# Patient Record
Sex: Female | Born: 1950
Health system: Southern US, Community
[De-identification: ages and names within clinical notes are randomized; demographics above are authoritative.]

## PROBLEM LIST (undated history)

## (undated) DIAGNOSIS — F410 Panic disorder [episodic paroxysmal anxiety] without agoraphobia: Secondary | ICD-10-CM

## (undated) DIAGNOSIS — R918 Other nonspecific abnormal finding of lung field: Secondary | ICD-10-CM

## (undated) DIAGNOSIS — Z789 Other specified health status: Secondary | ICD-10-CM

## (undated) DIAGNOSIS — Z8669 Personal history of other diseases of the nervous system and sense organs: Secondary | ICD-10-CM

## (undated) DIAGNOSIS — Z8709 Personal history of other diseases of the respiratory system: Secondary | ICD-10-CM

## (undated) DIAGNOSIS — J449 Chronic obstructive pulmonary disease, unspecified: Secondary | ICD-10-CM

## (undated) DIAGNOSIS — Z5111 Encounter for antineoplastic chemotherapy: Secondary | ICD-10-CM

## (undated) DIAGNOSIS — S22000A Wedge compression fracture of unspecified thoracic vertebra, initial encounter for closed fracture: Secondary | ICD-10-CM

## (undated) DIAGNOSIS — J189 Pneumonia, unspecified organism: Secondary | ICD-10-CM

## (undated) DIAGNOSIS — C3492 Malignant neoplasm of unspecified part of left bronchus or lung: Secondary | ICD-10-CM

## (undated) DIAGNOSIS — K219 Gastro-esophageal reflux disease without esophagitis: Secondary | ICD-10-CM

## (undated) DIAGNOSIS — Z923 Personal history of irradiation: Secondary | ICD-10-CM

## (undated) HISTORY — DX: Wedge compression fracture of unspecified thoracic vertebra, initial encounter for closed fracture: S22.000A

## (undated) HISTORY — PX: ABDOMINAL HYSTERECTOMY: SHX81

## (undated) HISTORY — PX: OOPHORECTOMY: SHX86

## (undated) HISTORY — PX: APPENDECTOMY: SHX54

## (undated) HISTORY — DX: Other specified health status: Z78.9

## (undated) HISTORY — DX: Malignant neoplasm of unspecified part of left bronchus or lung: C34.92

## (undated) HISTORY — PX: TONSILLECTOMY: SUR1361

## (undated) HISTORY — DX: Encounter for antineoplastic chemotherapy: Z51.11

---

## 2014-07-09 ENCOUNTER — Emergency Department (HOSPITAL_BASED_OUTPATIENT_CLINIC_OR_DEPARTMENT_OTHER): Payer: Self-pay

## 2014-07-09 ENCOUNTER — Inpatient Hospital Stay (HOSPITAL_BASED_OUTPATIENT_CLINIC_OR_DEPARTMENT_OTHER)
Admission: EM | Admit: 2014-07-09 | Discharge: 2014-07-12 | DRG: 190 | Disposition: A | Discharge status: Home / Self Care | Payer: Self-pay | Attending: Internal Medicine | Admitting: Internal Medicine

## 2014-07-09 ENCOUNTER — Encounter (HOSPITAL_BASED_OUTPATIENT_CLINIC_OR_DEPARTMENT_OTHER): Payer: Self-pay | Admitting: Emergency Medicine

## 2014-07-09 DIAGNOSIS — C3492 Malignant neoplasm of unspecified part of left bronchus or lung: Secondary | ICD-10-CM

## 2014-07-09 DIAGNOSIS — F17200 Nicotine dependence, unspecified, uncomplicated: Secondary | ICD-10-CM

## 2014-07-09 DIAGNOSIS — R918 Other nonspecific abnormal finding of lung field: Secondary | ICD-10-CM | POA: Diagnosis present

## 2014-07-09 DIAGNOSIS — R0602 Shortness of breath: Secondary | ICD-10-CM

## 2014-07-09 DIAGNOSIS — J441 Chronic obstructive pulmonary disease with (acute) exacerbation: Principal | ICD-10-CM | POA: Diagnosis present

## 2014-07-09 DIAGNOSIS — J9621 Acute and chronic respiratory failure with hypoxia: Secondary | ICD-10-CM | POA: Diagnosis present

## 2014-07-09 DIAGNOSIS — F17210 Nicotine dependence, cigarettes, uncomplicated: Secondary | ICD-10-CM | POA: Diagnosis present

## 2014-07-09 HISTORY — DX: Chronic obstructive pulmonary disease, unspecified: J44.9

## 2014-07-09 HISTORY — DX: Malignant neoplasm of unspecified part of left bronchus or lung: C34.92

## 2014-07-09 LAB — CBC WITH DIFFERENTIAL/PLATELET
Basophils Absolute: 0.1 10*3/uL (ref 0.0–0.1)
Basophils Relative: 1 % (ref 0–1)
Eosinophils Absolute: 0.7 10*3/uL (ref 0.0–0.7)
Eosinophils Relative: 7 % — ABNORMAL HIGH (ref 0–5)
HCT: 41.1 % (ref 36.0–46.0)
Hemoglobin: 13.6 g/dL (ref 12.0–15.0)
LYMPHS ABS: 2.2 10*3/uL (ref 0.7–4.0)
LYMPHS PCT: 22 % (ref 12–46)
MCH: 31.1 pg (ref 26.0–34.0)
MCHC: 33.1 g/dL (ref 30.0–36.0)
MCV: 94.1 fL (ref 78.0–100.0)
MONOS PCT: 9 % (ref 3–12)
Monocytes Absolute: 0.9 10*3/uL (ref 0.1–1.0)
NEUTROS PCT: 61 % (ref 43–77)
Neutro Abs: 6 10*3/uL (ref 1.7–7.7)
Platelets: 319 10*3/uL (ref 150–400)
RBC: 4.37 MIL/uL (ref 3.87–5.11)
RDW: 14.9 % (ref 11.5–15.5)
WBC: 9.8 10*3/uL (ref 4.0–10.5)

## 2014-07-09 LAB — BASIC METABOLIC PANEL
Anion gap: 14 (ref 5–15)
BUN: 7 mg/dL (ref 6–23)
CHLORIDE: 102 meq/L (ref 96–112)
CO2: 26 meq/L (ref 19–32)
Calcium: 8.9 mg/dL (ref 8.4–10.5)
Creatinine, Ser: 0.5 mg/dL (ref 0.50–1.10)
GFR calc Af Amer: >90 mL/min (ref >90)
GFR calc non Af Amer: >90 mL/min (ref >90)
Glucose, Bld: 98 mg/dL (ref 70–99)
POTASSIUM: 3.5 meq/L — AB (ref 3.7–5.3)
SODIUM: 142 meq/L (ref 137–147)

## 2014-07-09 LAB — TROPONIN I: Troponin I: <0.3 ng/mL (ref <0.30)

## 2014-07-09 MED ORDER — LEVOFLOXACIN IN D5W 500 MG/100ML IV SOLN
500.0000 mg | Freq: Every day | INTRAVENOUS | Status: DC
  Administered 2014-07-09 – 2014-07-10 (×2): 500 mg via INTRAVENOUS
  Filled 2014-07-09 (×2): qty 100

## 2014-07-09 MED ORDER — DEXTROSE 5 % IV SOLN
1.0000 g | Freq: Once | INTRAVENOUS | Status: AC
  Administered 2014-07-09: 1 g via INTRAVENOUS

## 2014-07-09 MED ORDER — ALBUTEROL SULFATE (2.5 MG/3ML) 0.083% IN NEBU: 2.5000 mg | INHALATION_SOLUTION | Freq: Four times a day (QID) | RESPIRATORY_TRACT | Status: DC | PRN

## 2014-07-09 MED ORDER — METHYLPREDNISOLONE SODIUM SUCC 125 MG IJ SOLR
80.0000 mg | Freq: Three times a day (TID) | INTRAMUSCULAR | Status: DC
  Administered 2014-07-09 – 2014-07-11 (×5): 80 mg via INTRAVENOUS
  Filled 2014-07-09 (×8): qty 1.28

## 2014-07-09 MED ORDER — ALBUTEROL SULFATE (2.5 MG/3ML) 0.083% IN NEBU
2.5000 mg | INHALATION_SOLUTION | Freq: Four times a day (QID) | RESPIRATORY_TRACT | Status: DC
  Administered 2014-07-09: 2.5 mg via RESPIRATORY_TRACT
  Filled 2014-07-09: qty 3

## 2014-07-09 MED ORDER — ENOXAPARIN SODIUM 40 MG/0.4ML ~~LOC~~ SOLN
40.0000 mg | SUBCUTANEOUS | Status: DC
  Administered 2014-07-09 – 2014-07-10 (×2): 40 mg via SUBCUTANEOUS
  Filled 2014-07-09 (×4): qty 0.4

## 2014-07-09 MED ORDER — HYDROCODONE-ACETAMINOPHEN 5-325 MG PO TABS
1.0000 | ORAL_TABLET | Freq: Four times a day (QID) | ORAL | Status: DC | PRN
  Administered 2014-07-10 – 2014-07-11 (×3): 1 via ORAL
  Filled 2014-07-09 (×3): qty 1

## 2014-07-09 MED ORDER — HYDROCOD POLST-CHLORPHEN POLST 10-8 MG/5ML PO LQCR
5.0000 mL | Freq: Two times a day (BID) | ORAL | Status: DC | PRN
  Administered 2014-07-09 – 2014-07-11 (×2): 5 mL via ORAL
  Filled 2014-07-09 (×3): qty 5

## 2014-07-09 MED ORDER — ALBUTEROL (5 MG/ML) CONTINUOUS INHALATION SOLN
10.0000 mg/h | INHALATION_SOLUTION | RESPIRATORY_TRACT | Status: DC
  Filled 2014-07-09 (×2): qty 20

## 2014-07-09 MED ORDER — CEFTRIAXONE SODIUM 1 G IJ SOLR
INTRAMUSCULAR | Status: AC
  Filled 2014-07-09: qty 10

## 2014-07-09 MED ORDER — IOHEXOL 300 MG/ML  SOLN
80.0000 mL | Freq: Once | INTRAMUSCULAR | Status: AC | PRN
  Administered 2014-07-09: 80 mL via INTRAVENOUS

## 2014-07-09 MED ORDER — TIOTROPIUM BROMIDE MONOHYDRATE 18 MCG IN CAPS
18.0000 ug | ORAL_CAPSULE | Freq: Every day | RESPIRATORY_TRACT | Status: DC
  Administered 2014-07-10: 18 ug via RESPIRATORY_TRACT
  Filled 2014-07-09: qty 5

## 2014-07-09 MED ORDER — SODIUM CHLORIDE 0.9 % IJ SOLN
3.0000 mL | Freq: Two times a day (BID) | INTRAMUSCULAR | Status: DC
  Administered 2014-07-09 – 2014-07-12 (×5): 3 mL via INTRAVENOUS

## 2014-07-09 MED ORDER — ALBUTEROL SULFATE (2.5 MG/3ML) 0.083% IN NEBU
10.0000 mg | INHALATION_SOLUTION | Freq: Once | RESPIRATORY_TRACT | Status: AC
  Administered 2014-07-09: 10 mg via RESPIRATORY_TRACT

## 2014-07-09 MED ORDER — SODIUM CHLORIDE 0.9 % IV SOLN
INTRAVENOUS | Status: AC
  Administered 2014-07-09 – 2014-07-10 (×2): via INTRAVENOUS

## 2014-07-09 MED ORDER — DEXTROSE 5 % IV SOLN
500.0000 mg | Freq: Once | INTRAVENOUS | Status: AC
  Administered 2014-07-09: 500 mg via INTRAVENOUS
  Filled 2014-07-09: qty 500

## 2014-07-09 MED ORDER — IPRATROPIUM BROMIDE 0.02 % IN SOLN
0.5000 mg | Freq: Once | RESPIRATORY_TRACT | Status: AC
  Administered 2014-07-09: 0.5 mg via RESPIRATORY_TRACT
  Filled 2014-07-09: qty 2.5

## 2014-07-09 MED ORDER — ALBUTEROL SULFATE (2.5 MG/3ML) 0.083% IN NEBU
5.0000 mg | INHALATION_SOLUTION | Freq: Once | RESPIRATORY_TRACT | Status: AC
  Administered 2014-07-09: 5 mg via RESPIRATORY_TRACT
  Filled 2014-07-09: qty 6

## 2014-07-09 MED ORDER — METHYLPREDNISOLONE SODIUM SUCC 125 MG IJ SOLR
125.0000 mg | Freq: Once | INTRAMUSCULAR | Status: AC
  Administered 2014-07-09: 125 mg via INTRAVENOUS
  Filled 2014-07-09: qty 2

## 2014-07-09 MED ORDER — LORAZEPAM 1 MG PO TABS
0.5000 mg | ORAL_TABLET | Freq: Once | ORAL | Status: AC
  Administered 2014-07-09: 0.5 mg via ORAL
  Filled 2014-07-09: qty 1

## 2014-07-09 MED ORDER — ALBUTEROL SULFATE (2.5 MG/3ML) 0.083% IN NEBU
2.5000 mg | INHALATION_SOLUTION | Freq: Three times a day (TID) | RESPIRATORY_TRACT | Status: DC
  Administered 2014-07-10: 2.5 mg via RESPIRATORY_TRACT
  Filled 2014-07-09: qty 3

## 2014-07-09 MED ORDER — SODIUM CHLORIDE 0.9 % IV SOLN
Freq: Once | INTRAVENOUS | Status: AC
  Administered 2014-07-09: 16:00:00 via INTRAVENOUS

## 2014-07-09 NOTE — ED Notes (Signed)
Pt presents to ED with complaints of cough and shortness of breath.

## 2014-07-09 NOTE — H&P (Addendum)
Heather Banks is an 63 y.o. female.    Pcp: unassigned  Chief Complaint: dyspnea HPI: 63 yo female smoker with hx of copd, tobacco use apparently c/o cough w yellow sputum, sob, wheezing.  Pt presented to ED for evaluation and found to have spiculated mass on CT scan.  Pt is admitted for w/up of Copd exacerbation and lung mass.   Past Medical History  Diagnosis Date  . COPD (chronic obstructive pulmonary disease)   . Tobacco use     Past Surgical History  Procedure Laterality Date  . Abdominal hysterectomy      Family History  Problem Relation Age of Onset  . COPD Father    Social History:  reports that she has been smoking Cigarettes.  She has a 40 pack-year smoking history. She does not have any smokeless tobacco history on file. She reports that she does not drink alcohol. Her drug history is not on file.  Allergies: No Known Allergies  Medications Prior to Admission  Medication Sig Dispense Refill  . Multiple Vitamins-Minerals (MULTIVITAMIN WITH MINERALS) tablet Take 1 tablet by mouth daily.      Results for orders placed or performed during the hospital encounter of 07/09/14 (from the past 48 hour(s))  Troponin I     Status: None   Collection Time: 07/09/14  3:25 PM  Result Value Ref Range   Troponin I <0.30 <0.30 ng/mL    Comment:        Due to the release kinetics of cTnI, a negative result within the first hours of the onset of symptoms does not rule out myocardial infarction with certainty. If myocardial infarction is still suspected, repeat the test at appropriate intervals.   CBC with Differential     Status: Abnormal   Collection Time: 07/09/14  3:25 PM  Result Value Ref Range   WBC 9.8 4.0 - 10.5 K/uL   RBC 4.37 3.87 - 5.11 MIL/uL   Hemoglobin 13.6 12.0 - 15.0 g/dL   HCT 41.1 36.0 - 46.0 %   MCV 94.1 78.0 - 100.0 fL   MCH 31.1 26.0 - 34.0 pg   MCHC 33.1 30.0 - 36.0 g/dL   RDW 14.9 11.5 - 15.5 %   Platelets 319 150 - 400 K/uL   Neutrophils Relative  % 61 43 - 77 %   Neutro Abs 6.0 1.7 - 7.7 K/uL   Lymphocytes Relative 22 12 - 46 %   Lymphs Abs 2.2 0.7 - 4.0 K/uL   Monocytes Relative 9 3 - 12 %   Monocytes Absolute 0.9 0.1 - 1.0 K/uL   Eosinophils Relative 7 (H) 0 - 5 %   Eosinophils Absolute 0.7 0.0 - 0.7 K/uL   Basophils Relative 1 0 - 1 %   Basophils Absolute 0.1 0.0 - 0.1 K/uL  Basic metabolic panel     Status: Abnormal   Collection Time: 07/09/14  3:25 PM  Result Value Ref Range   Sodium 142 137 - 147 mEq/L   Potassium 3.5 (L) 3.7 - 5.3 mEq/L   Chloride 102 96 - 112 mEq/L   CO2 26 19 - 32 mEq/L   Glucose, Bld 98 70 - 99 mg/dL   BUN 7 6 - 23 mg/dL   Creatinine, Ser 0.50 0.50 - 1.10 mg/dL   Calcium 8.9 8.4 - 10.5 mg/dL   GFR calc non Af Amer >90 >90 mL/min   GFR calc Af Amer >90 >90 mL/min    Comment: (NOTE) The eGFR has been calculated using  the CKD EPI equation. This calculation has not been validated in all clinical situations. eGFR's persistently <90 mL/min signify possible Chronic Kidney Disease.    Anion gap 14 5 - 15   Ct Chest W Contrast  07/09/2014   CLINICAL DATA:  Left upper lobe lung mass. History of smoking and cough. Congestion.  EXAM: CT CHEST WITH CONTRAST  TECHNIQUE: Multidetector CT imaging of the chest was performed during intravenous contrast administration.  CONTRAST:  86m OMNIPAQUE IOHEXOL 300 MG/ML  SOLN  COMPARISON:  Chest x-ray 07/09/2014  FINDINGS: Heart: Heart size is normal. Coronary artery calcifications are noted. No pericardial effusion.  Vascular structures: There is atherosclerotic calcification of the thoracic aorta. No aneurysm or dissection. Pulmonary arteries are grossly well opacified.  Mediastinum/thyroid: Small mediastinal and hilar lymph nodes are identified. Left hilar lymph node measures 8 mm. Pre vascular lymph node measures 10 mm. Precarinal lymph node measures 12 mm. Right hilar lymph node measures 13 mm. The visualized portion of the thyroid gland has a normal appearance.   Lungs/Airways: Within the left upper lobe there is an irregular spiculated mass which measures 3.3 x 4.3 x 4.2 cm. This is adjacent to the thickened pleura and is suspicious for primary bronchogenic neoplasm. Within the right upper lobe a similar appearing spiculated mass is identified. This measures 1.9 x 2.6 x 2.9 cm. Within the right middle lobe there is irregular density which appears less masslike and measures 2.9 x 3.5 cm. Lungs are hyperinflated. Emphysematous changes are present.  Upper abdomen: The visualized portions of the adrenal glands are unremarkable. Right renal cysts are noted. Gallbladder is present. There is atherosclerotic calcification of the abdominal aorta.  Chest wall/osseous structures: There are degenerative changes in the thoracic spine. No suspicious lytic or blastic lesions are identified.  IMPRESSION: 1. Spiculated mass in the left upper lobe contiguous with thickened pleura measuring 4.3 cm and suspicious for primary bronchogenic neoplasm. 2. A second similar 2.9 cm nodule is identified in the right upper lobe suspicious for a second primary. 3. Small bilateral hilar and mediastinal lymph nodes as described. 4. Emphysema. 5. Normal adrenal glands. 6. Small right renal cysts. 7. Coronary artery calcifications.   Electronically Signed   By: BShon HaleM.D.   On: 07/09/2014 17:03   Dg Chest Port 1 View  07/09/2014   CLINICAL DATA:  Cough and congestion for several days.  EXAM: PORTABLE CHEST - 1 VIEW  COMPARISON:  None.  FINDINGS: Focal masslike opacity in the left upper lobe near the apex with adjacent pleural thickening. This opacity measures 3.7 cm in diameter.  Coarse reticular and patchy airspace opacity is noted in the right upper lobe.  Lungs are hyperexpanded but otherwise clear. No pleural effusion or pneumothorax.  Cardiac silhouette is normal in size. No mediastinal or hilar masses.  Bony thorax is demineralized but intact.  IMPRESSION: 1. Focal masslike opacity in the  left upper lobe near the apex. This may reflect focal pneumonia. Neoplastic disease is also in the differential diagnosis. Patient also has right upper lobe opacity that may reflect scarring or acute infiltrate or a combination. Recommend followup chest CT with contrast for further assessment. 2. Lungs are mildly hyperexpanded.  This suggests underlying COPD.   Electronically Signed   By: DLajean ManesM.D.   On: 07/09/2014 15:25    Review of Systems  Constitutional: Positive for weight loss. Negative for fever, chills, malaise/fatigue and diaphoresis.  HENT: Negative for congestion, ear discharge, ear pain, hearing loss, nosebleeds,  sore throat and tinnitus.   Eyes: Negative for blurred vision, double vision, photophobia, pain, discharge and redness.  Respiratory: Positive for cough, shortness of breath and wheezing. Negative for hemoptysis, sputum production and stridor.   Cardiovascular: Negative for chest pain, palpitations, orthopnea, claudication, leg swelling and PND.  Gastrointestinal: Negative for heartburn, nausea, vomiting, abdominal pain, diarrhea, constipation, blood in stool and melena.  Genitourinary: Negative for dysuria, urgency, frequency, hematuria and flank pain.  Musculoskeletal: Negative for myalgias, back pain, joint pain, falls and neck pain.  Skin: Negative for itching and rash.  Neurological: Negative for dizziness, tingling, tremors, sensory change, speech change, focal weakness, seizures, loss of consciousness, weakness and headaches.  Endo/Heme/Allergies: Negative for environmental allergies and polydipsia. Does not bruise/bleed easily.  Psychiatric/Behavioral: Negative for depression, suicidal ideas, hallucinations, memory loss and substance abuse. The patient is not nervous/anxious and does not have insomnia.     Blood pressure 146/67, pulse 106, temperature 98.4 F (36.9 C), temperature source Oral, resp. rate 20, height _0  (1.651 m), weight 70.308 kg (155 lb),  SpO2 90 %. Physical Exam  Constitutional: She is oriented to person, place, and time. She appears well-developed and well-nourished.  HENT:  Head: Normocephalic and atraumatic.  Eyes: Conjunctivae and EOM are normal. Pupils are equal, round, and reactive to light.  Neck: Normal range of motion. Neck supple. No JVD present. No tracheal deviation present. No thyromegaly present.  Cardiovascular: Normal rate and regular rhythm.  Exam reveals no gallop and no friction rub.   No murmur heard. Respiratory: Effort normal. No stridor. She has wheezes. She has no rales.  GI: Soft. Bowel sounds are normal. She exhibits no distension. There is no tenderness. There is no rebound and no guarding.  Musculoskeletal: Normal range of motion. She exhibits no edema or tenderness.  Lymphadenopathy:    She has no cervical adenopathy.  Neurological: She is alert and oriented to person, place, and time. She has normal reflexes. She displays normal reflexes. No cranial nerve deficit. She exhibits normal muscle tone. Coordination normal.  Skin: Skin is warm and dry. No rash noted. No erythema. No pallor.  Psychiatric: She has a normal mood and affect. Her behavior is normal. Judgment and thought content normal.     Assessment/Plan Dyspnea secondary to copd exacerbation and lung mass Solumedrol 51m iv q8h spiriva 1puff qday Albuterol 0.083% 1 neb q6h and q6h prn Levaquin 504miv qday Will need pft once more stable  Lung mass  Pulmonary consulted, we appreciate their input Check MRI brain Check PET scan  Headache Check MRI brain,  Pt thinks related to stress , occipital  Tobacco use Pt is not ready to stop counseled pt on smoking cessation for about 73m58mtes   KIMJani Gravel/13/2015, 10:03 PM

## 2014-07-09 NOTE — ED Notes (Signed)
[facility] notified, [id number]

## 2014-07-09 NOTE — ED Provider Notes (Signed)
CSN: [id number]     Arrival date & time 07/09/14  1443 History   First MD Initiated Contact with Patient 07/09/14 1450     Chief Complaint  Patient presents with  . Shortness of Breath      Patient is a 63 y.o. female presenting with shortness of breath. The history is provided by the patient.  Shortness of Breath  Heather Banks presents for evaluation of shortness of breath and cough. She states that she has a history of bronchitis and takes when necessary albuterol at home. She's had worsening symptoms since Thanksgiving when her [place] pulled over in the oven and there was a lot of smoke in the house. That time she's had progressive cough is productive of yellow sputum and worsening shortness of breath and dyspnea on exertion. He denies any fevers but does have occasional sweating episodes. She denies chest pain, leg swelling or pain, abdominal pain, vomiting, diarrhea. She denies any additional medical problems and has no primary care provider. Symptoms are moderate, constant, worsening.  History reviewed. No pertinent past medical history. Past Surgical History  Procedure Laterality Date  . Abdominal hysterectomy     No family history on file. History  Substance Use Topics  . Smoking status: Current Some Day Smoker  . Smokeless tobacco: Not on file  . Alcohol Use: Not on file   OB History    No data available     Review of Systems  Respiratory: Positive for shortness of breath.   All other systems reviewed and are negative.     Allergies  Review of patient's allergies indicates no known allergies.  Home Medications   Prior to Admission medications   Not on File   BP 155/101 mmHg  Pulse 110  Temp(Src) 98 F (36.7 C) (Oral)  Resp 30  Ht 5\' 5"  (1.651 m)  Wt 155 lb (70.308 kg)  BMI 25.79 kg/m2  SpO2 84% Physical Exam  Constitutional: She is oriented to person, place, and time. She appears well-developed and well-nourished.  HENT:  Head: Normocephalic and  atraumatic.  Cardiovascular: Regular rhythm.   No murmur heard. tachycardic  Pulmonary/Chest:  Tachypneic, accessory muscle use.  Diffuse wheezes in all lung fields.    Abdominal: Soft. There is no tenderness. There is no rebound and no guarding.  Musculoskeletal: She exhibits no edema or tenderness.  Neurological: She is alert and oriented to person, place, and time.  Skin: Skin is warm and dry.  Psychiatric: She has a normal mood and affect. Her behavior is normal.  Nursing note and vitals reviewed.   ED Course  Procedures (including critical care time) Labs Review Labs Reviewed  CBC WITH DIFFERENTIAL - Abnormal; Notable for the following:    Eosinophils Relative 7 (*)    All other components within normal limits  BASIC METABOLIC PANEL - Abnormal; Notable for the following:    Potassium 3.5 (*)    All other components within normal limits  TROPONIN I    Imaging Review Ct Chest W Contrast  07/09/2014   CLINICAL DATA:  Left upper lobe lung mass. History of smoking and cough. Congestion.  EXAM: CT CHEST WITH CONTRAST  TECHNIQUE: Multidetector CT imaging of the chest was performed during intravenous contrast administration.  CONTRAST:  71mL OMNIPAQUE IOHEXOL 300 MG/ML  SOLN  COMPARISON:  Chest x-ray 07/09/2014  FINDINGS: Heart: Heart size is normal. Coronary artery calcifications are noted. No pericardial effusion.  Vascular structures: There is atherosclerotic calcification of the thoracic aorta. No aneurysm  or dissection. Pulmonary arteries are grossly well opacified.  Mediastinum/thyroid: Small mediastinal and hilar lymph nodes are identified. Left hilar lymph node measures 8 mm. Pre vascular lymph node measures 10 mm. Precarinal lymph node measures 12 mm. Right hilar lymph node measures 13 mm. The visualized portion of the thyroid gland has a normal appearance.  Lungs/Airways: Within the left upper lobe there is an irregular spiculated mass which measures 3.3 x 4.3 x 4.2 cm. This is  adjacent to the thickened pleura and is suspicious for primary bronchogenic neoplasm. Within the right upper lobe a similar appearing spiculated mass is identified. This measures 1.9 x 2.6 x 2.9 cm. Within the right middle lobe there is irregular density which appears less masslike and measures 2.9 x 3.5 cm. Lungs are hyperinflated. Emphysematous changes are present.  Upper abdomen: The visualized portions of the adrenal glands are unremarkable. Right renal cysts are noted. Gallbladder is present. There is atherosclerotic calcification of the abdominal aorta.  Chest wall/osseous structures: There are degenerative changes in the thoracic spine. No suspicious lytic or blastic lesions are identified.  IMPRESSION: 1. Spiculated mass in the left upper lobe contiguous with thickened pleura measuring 4.3 cm and suspicious for primary bronchogenic neoplasm. 2. A second similar 2.9 cm nodule is identified in the right upper lobe suspicious for a second primary. 3. Small bilateral hilar and mediastinal lymph nodes as described. 4. Emphysema. 5. Normal adrenal glands. 6. Small right renal cysts. 7. Coronary artery calcifications.   Electronically Signed   By: Shon Hale M.D.   On: 07/09/2014 17:03   Dg Chest Port 1 View  07/09/2014   CLINICAL DATA:  Cough and congestion for several days.  EXAM: PORTABLE CHEST - 1 VIEW  COMPARISON:  None.  FINDINGS: Focal masslike opacity in the left upper lobe near the apex with adjacent pleural thickening. This opacity measures 3.7 cm in diameter.  Coarse reticular and patchy airspace opacity is noted in the right upper lobe.  Lungs are hyperexpanded but otherwise clear. No pleural effusion or pneumothorax.  Cardiac silhouette is normal in size. No mediastinal or hilar masses.  Bony thorax is demineralized but intact.  IMPRESSION: 1. Focal masslike opacity in the left upper lobe near the apex. This may reflect focal pneumonia. Neoplastic disease is also in the differential diagnosis.  Patient also has right upper lobe opacity that may reflect scarring or acute infiltrate or a combination. Recommend followup chest CT with contrast for further assessment. 2. Lungs are mildly hyperexpanded.  This suggests underlying COPD.   Electronically Signed   By: Lajean Manes M.D.   On: 07/09/2014 15:25     EKG Interpretation   Date/Time:  Sunday July 09 2014 15:01:06 EST Ventricular Rate:  81 PR Interval:  156 QRS Duration: 88 QT Interval:  362 QTC Calculation: 420 R Axis:   87 Text Interpretation:  Normal sinus rhythm Normal ECG Confirmed by Hazle Coca [id number]) on 07/09/2014 3:04:07 PM      MDM   Final diagnoses:  Shortness of breath  Acute exacerbation of chronic obstructive pulmonary disease (COPD)  Lung mass   Pt feels improved on recheck after hour-long nebulizer treatment.  Accessory muscle use resolved.  Pt has improved air movement bilaterally but does have persistent wheezing.    Patient does have persistent hypoxia on recheck. After nebulizer treatment patient's sats dropped into the upper 80s, 88-89% without supplemental oxygen. Patient recommendation for admission for COPD exacerbation with hypoxia. Also discussed with patient chest x-ray and  CT scan findings with mass lesions that are concerning for lung cancer. Patient provided Rocephin and azithromycin given Initial concern for potential community-acquired pneumonia. Given CT scan results, feel pneumonia is less likely at this time. Discuss[facility] at [facility], Dr. Cruzita Lederer, he agrees to take the patient in transfer.     Quintella Reichert, MD 07/09/14 [phone]

## 2014-07-10 ENCOUNTER — Inpatient Hospital Stay (HOSPITAL_COMMUNITY): Payer: Self-pay

## 2014-07-10 ENCOUNTER — Telehealth: Payer: Self-pay | Admitting: Internal Medicine

## 2014-07-10 DIAGNOSIS — R918 Other nonspecific abnormal finding of lung field: Secondary | ICD-10-CM

## 2014-07-10 DIAGNOSIS — J441 Chronic obstructive pulmonary disease with (acute) exacerbation: Principal | ICD-10-CM

## 2014-07-10 LAB — PULMONARY FUNCTION TEST
DL/VA % PRED: 90 %
DL/VA: 4.46 ml/min/mmHg/L
DLCO cor % pred: 55 %
DLCO cor: 14.25 ml/min/mmHg
DLCO unc % pred: 56 %
DLCO unc: 14.34 ml/min/mmHg
FEF 25-75 PRE: 0.56 L/s
FEF2575-%PRED-PRE: 23 %
FEV1-%PRED-PRE: 46 %
FEV1-PRE: 1.21 L
FEV1FVC-%Pred-Pre: 75 %
FEV6-%Pred-Pre: 60 %
FEV6-Pre: 1.98 L
FEV6FVC-%Pred-Pre: 100 %
FVC-%PRED-PRE: 60 %
FVC-Pre: 2.05 L
Pre FEV1/FVC ratio: 59 %
Pre FEV6/FVC Ratio: 96 %
RV % PRED: 316 %
RV: 6.53 L
TLC % pred: 169 %
TLC: 8.82 L

## 2014-07-10 LAB — BLOOD GAS, ARTERIAL
Acid-Base Excess: 4.3 mmol/L — ABNORMAL HIGH (ref 0.0–2.0)
BICARBONATE: 28.8 meq/L — AB (ref 20.0–24.0)
Drawn by: 39899
O2 Content: 3 L/min
O2 Saturation: 91.5 %
PATIENT TEMPERATURE: 98.6
PH ART: 7.427 (ref 7.350–7.450)
TCO2: 25.4 mmol/L (ref 0–100)
pCO2 arterial: 44.5 mmHg (ref 35.0–45.0)
pO2, Arterial: 61.7 mmHg — ABNORMAL LOW (ref 80.0–100.0)

## 2014-07-10 LAB — COMPREHENSIVE METABOLIC PANEL
ALT: 8 U/L (ref 0–35)
ANION GAP: 15 (ref 5–15)
AST: 11 U/L (ref 0–37)
Albumin: 3.1 g/dL — ABNORMAL LOW (ref 3.5–5.2)
Alkaline Phosphatase: 145 U/L — ABNORMAL HIGH (ref 39–117)
BUN: 4 mg/dL — AB (ref 6–23)
CALCIUM: 9.1 mg/dL (ref 8.4–10.5)
CO2: 26 mEq/L (ref 19–32)
Chloride: 100 mEq/L (ref 96–112)
Creatinine, Ser: 0.41 mg/dL — ABNORMAL LOW (ref 0.50–1.10)
GFR calc Af Amer: >90 mL/min (ref >90)
GFR calc non Af Amer: >90 mL/min (ref >90)
GLUCOSE: 146 mg/dL — AB (ref 70–99)
Potassium: 3.3 mEq/L — ABNORMAL LOW (ref 3.7–5.3)
Sodium: 141 mEq/L (ref 137–147)
TOTAL PROTEIN: 7 g/dL (ref 6.0–8.3)
Total Bilirubin: <0.2 mg/dL — ABNORMAL LOW (ref 0.3–1.2)

## 2014-07-10 LAB — APTT: aPTT: 40 seconds — ABNORMAL HIGH (ref 24–37)

## 2014-07-10 LAB — LACTATE DEHYDROGENASE: LDH: 133 U/L (ref 94–250)

## 2014-07-10 LAB — PROTIME-INR
INR: 0.9 (ref 0.00–1.49)
PROTHROMBIN TIME: 12.3 s (ref 11.6–15.2)

## 2014-07-10 MED ORDER — GADOBENATE DIMEGLUMINE 529 MG/ML IV SOLN
15.0000 mL | Freq: Once | INTRAVENOUS | Status: AC | PRN
  Administered 2014-07-10: 15 mL via INTRAVENOUS

## 2014-07-10 MED ORDER — HYDROCOD POLST-CHLORPHEN POLST 10-8 MG/5ML PO LQCR
5.0000 mL | Freq: Two times a day (BID) | ORAL | Status: DC
  Administered 2014-07-10 (×2): 5 mL via ORAL
  Filled 2014-07-10: qty 5

## 2014-07-10 MED ORDER — BUDESONIDE 0.25 MG/2ML IN SUSP
0.2500 mg | Freq: Two times a day (BID) | RESPIRATORY_TRACT | Status: DC
  Administered 2014-07-10 – 2014-07-12 (×4): 0.25 mg via RESPIRATORY_TRACT
  Filled 2014-07-10 (×4): qty 2

## 2014-07-10 MED ORDER — IPRATROPIUM-ALBUTEROL 0.5-2.5 (3) MG/3ML IN SOLN
3.0000 mL | RESPIRATORY_TRACT | Status: DC
  Administered 2014-07-10 – 2014-07-12 (×12): 3 mL via RESPIRATORY_TRACT
  Filled 2014-07-10 (×14): qty 3

## 2014-07-10 MED ORDER — POTASSIUM CHLORIDE CRYS ER 20 MEQ PO TBCR
40.0000 meq | EXTENDED_RELEASE_TABLET | Freq: Once | ORAL | Status: AC
  Administered 2014-07-10: 40 meq via ORAL
  Filled 2014-07-10: qty 2

## 2014-07-10 NOTE — Plan of Care (Signed)
Problem: Phase I Progression Outcomes Goal: Flu/PneumoVaccines if indicated Outcome: Not Applicable Date Met:  77/93/90 Has already received both PNA and influenza vaccines Goal: Discharge plan established Outcome: Completed/Met Date Met:  07/10/14 home

## 2014-07-10 NOTE — Telephone Encounter (Signed)
Thanks., I will deal with it when I round on her tomorrow. Will close note

## 2014-07-10 NOTE — Consult Note (Signed)
PULMONARY / CRITICAL CARE MEDICINE   Name: Heather Banks MRN: [id number] DOB: 04/23/1951    ADMISSION DATE:  07/09/2014 CONSULTATION DATE:  07/10/14 REFERRING MD :  Dr Allyson Sabal  CHIEF COMPLAINT:   - lung mass  - copd  SIGNIFICANT EVENTS: 07/09/2014 - admit 07/10/14 - pccm consult    HISTORY OF PRESENT ILLNESS:    63 year old female  With extensive smoking hx and no past medical hx (does not go to MD). Hsuband died 07/30/06 years ago from cancer. Has chronic cough and mild baseline dyspnea that she put down to "chronic bronchitis" but not on active Rx or medical followup. Admitted 07/09/2014 with 2 week history of progressive shortness of breath and cough and is currently being treated for COPD exacerbation. She is some better since admission following bronchodilatation steroids and antibiotics and chest x-ray at admission showed lung mass and this was confirmed with a CT scan of the chest which shows bilateral upper lobe lung mass along with minimally enlarged hilar and mediastinal nodes. Pulmonary critical care has now been consulted for both issues   PAST MEDICAL HISTORY :   has a past medical history of COPD (chronic obstructive pulmonary disease) and Tobacco use.  has past surgical history that includes Abdominal hysterectomy. Prior to Admission medications   Medication Sig Start Date End Date Taking? Authorizing Provider  Multiple Vitamins-Minerals (MULTIVITAMIN WITH MINERALS) tablet Take 1 tablet by mouth daily.   Yes Historical Provider, MD   No Known Allergies  FAMILY HISTORY:  indicated that her mother is alive. She indicated that her father is deceased.  SOCIAL HISTORY:  reports that she has been smoking Cigarettes.  She has a 40 pack-year smoking history. She does not have any smokeless tobacco history on file. She reports that she does not drink alcohol.  REVIEW OF SYSTEMS:  This is as per history of present illness otherwise 11 point review of systems is  negative  SUBJECTIVE:   VITAL SIGNS: Temp:  [98 F (36.7 C)-98.4 F (36.9 C)] 98.1 F (36.7 C) (12/14 0516) Pulse Rate:  [72-110] 78 (12/14 0516) Resp:  [14-30] 18 (12/14 0516) BP: (122-160)/(62-101) 134/70 mmHg (12/14 0516) SpO2:  [84 %-100 %] 93 % (12/14 0801) FiO2 (%):  [93 %] 93 % (12/13 1625) Weight:  [70.308 kg (155 lb)-76.204 kg (168 lb)] 76.204 kg (168 lb) (12/14 0516) HEMODYNAMICS:   VENTILATOR SETTINGS: Vent Mode:  [-]  FiO2 (%):  [93 %] 93 % INTAKE / OUTPUT:  Intake/Output Summary (Last 24 hours) at 07/10/14 1013 Last data filed at 07/10/14 0631  Gross per 24 hour  Intake 791.25 ml  Output    500 ml  Net 291.25 ml    PHYSICAL EXAMINATION: General:  Barrel chested woman sitting in her bed. In no distress Neuro:  Alert and oriented 3, speech is normal, moves all 4 extremities HEENT:  Neck is supple no neck nodes Cardiovascular:  S1-S2 present regular rate and rhythm no murmurs Lungs:  Barrel chested, mild pursed lip breathing and bilateral wheezing on expiration present Abdomen:  Soft, nontender no masses no organomegaly Musculoskeletal:  No cyanosis no clubbing no edema Skin:  Appears intact  LABS:  CBC  Recent Labs Lab 07/09/14 1525  WBC 9.8  HGB 13.6  HCT 41.1  PLT 319   Coag's  Recent Labs Lab 07/09/14 2349 07/10/14 0459  APTT  --  40*  INR 0.90  --    BMET  Recent Labs Lab 07/09/14 1525 07/10/14 0459  NA 142 141  K 3.5* 3.3*  CL 102 100  CO2 26 26  BUN 7 4*  CREATININE 0.50 0.41*  GLUCOSE 98 146*   Electrolytes  Recent Labs Lab 07/09/14 1525 07/10/14 0459  CALCIUM 8.9 9.1   Sepsis Markers No results for input(s): LATICACIDVEN, PROCALCITON, O2SATVEN in the last 168 hours. ABG No results for input(s): PHART, PCO2ART, PO2ART in the last 168 hours. Liver Enzymes  Recent Labs Lab 07/10/14 0459  AST 11  ALT 8  ALKPHOS 145*  BILITOT <0.2*  ALBUMIN 3.1*   Cardiac Enzymes  Recent Labs Lab 07/09/14 1525   TROPONINI <0.30   Glucose No results for input(s): GLUCAP in the last 168 hours.  Imaging Ct Chest W Contrast  07/09/2014   CLINICAL DATA:  Left upper lobe lung mass. History of smoking and cough. Congestion.  EXAM: CT CHEST WITH CONTRAST  TECHNIQUE: Multidetector CT imaging of the chest was performed during intravenous contrast administration.  CONTRAST:  40mL OMNIPAQUE IOHEXOL 300 MG/ML  SOLN  COMPARISON:  Chest x-ray 07/09/2014  FINDINGS: Heart: Heart size is normal. Coronary artery calcifications are noted. No pericardial effusion.  Vascular structures: There is atherosclerotic calcification of the thoracic aorta. No aneurysm or dissection. Pulmonary arteries are grossly well opacified.  Mediastinum/thyroid: Small mediastinal and hilar lymph nodes are identified. Left hilar lymph node measures 8 mm. Pre vascular lymph node measures 10 mm. Precarinal lymph node measures 12 mm. Right hilar lymph node measures 13 mm. The visualized portion of the thyroid gland has a normal appearance.  Lungs/Airways: Within the left upper lobe there is an irregular spiculated mass which measures 3.3 x 4.3 x 4.2 cm. This is adjacent to the thickened pleura and is suspicious for primary bronchogenic neoplasm. Within the right upper lobe a similar appearing spiculated mass is identified. This measures 1.9 x 2.6 x 2.9 cm. Within the right middle lobe there is irregular density which appears less masslike and measures 2.9 x 3.5 cm. Lungs are hyperinflated. Emphysematous changes are present.  Upper abdomen: The visualized portions of the adrenal glands are unremarkable. Right renal cysts are noted. Gallbladder is present. There is atherosclerotic calcification of the abdominal aorta.  Chest wall/osseous structures: There are degenerative changes in the thoracic spine. No suspicious lytic or blastic lesions are identified.  IMPRESSION: 1. Spiculated mass in the left upper lobe contiguous with thickened pleura measuring 4.3 cm  and suspicious for primary bronchogenic neoplasm. 2. A second similar 2.9 cm nodule is identified in the right upper lobe suspicious for a second primary. 3. Small bilateral hilar and mediastinal lymph nodes as described. 4. Emphysema. 5. Normal adrenal glands. 6. Small right renal cysts. 7. Coronary artery calcifications.   Electronically Signed   By: Shon Hale M.D.   On: 07/09/2014 17:03   Dg Chest Port 1 View  07/09/2014   CLINICAL DATA:  Cough and congestion for several days.  EXAM: PORTABLE CHEST - 1 VIEW  COMPARISON:  None.  FINDINGS: Focal masslike opacity in the left upper lobe near the apex with adjacent pleural thickening. This opacity measures 3.7 cm in diameter.  Coarse reticular and patchy airspace opacity is noted in the right upper lobe.  Lungs are hyperexpanded but otherwise clear. No pleural effusion or pneumothorax.  Cardiac silhouette is normal in size. No mediastinal or hilar masses.  Bony thorax is demineralized but intact.  IMPRESSION: 1. Focal masslike opacity in the left upper lobe near the apex. This may reflect focal pneumonia. Neoplastic disease is  also in the differential diagnosis. Patient also has right upper lobe opacity that may reflect scarring or acute infiltrate or a combination. Recommend followup chest CT with contrast for further assessment. 2. Lungs are mildly hyperexpanded.  This suggests underlying COPD.   Electronically Signed   By: Lajean Manes M.D.   On: 07/09/2014 15:25     ASSESSMENT / PLAN:  PULMONARY  A: Acute on chronic respiratory failure secondary to COPD exacerbation with baseline COPD status severity unknown Bilateral upper lobe lung mass - likely lung cancer - well could be stage 4 with 2 nodules in contralateral lobes  P:   Needs AECOPD Rx and optimization first before lung cancer workup  - still wheezing, increase neb frequency Needs COPD severity quantification before deciding on biopsy approach  - get ABG and inpatient spirometry for  crude estimates Subsequently needs biopsy  - likely needs opd pet scan  - best test to tackle both lung mass is likely ENB bronch - d/w Dr Elsworth Soho who can evaluate for this - willb e opd procedure in few weeks   Broke news about above to patient She is tearful but accepted news    CARDIOVASCULAR  A: Coronary arterly calcification with trop x 1 negative P:  Needs OPD cardiac stress test  OTHER TObacco abuse  - needs to quit    Dr. Brand Males, M.D., [facility].C.P Pulmonary and Critical Care Medicine Staff Physician [facility] Pager: [phone], If no answer or between  15:00h - 7:00h: call [phone]  07/10/2014 10:24 AM

## 2014-07-10 NOTE — Progress Notes (Addendum)
[facility] PROGRESS NOTE  Heather Banks FIE:[id number] DOB: [birth year] DOA: 07/09/2014 PCP: No PCP Per Patient  Assessment/Plan: Active Problems:   COPD with acute exacerbation   Lung mass   COPD exacerbation   Acute exacerbation of chronic obstructive pulmonary disease (COPD)   COPD exacerbation Continue current treatment with IV steroids and empiric antibiotics, nebulizers Currently requiring high flow oxygen Started on Tussionex for cough  Bilateral upper lobe lung mass Consultants pulmonary Do recommend ABG and inpatient spirometry Outpatient PET scan Endobronchial lung biopsy , timing per pulmonary   Code Status: full Family Communication: family updated about patient's clinical progress Disposition Plan:  As above    Brief narrative: 63 year old female With extensive smoking hx and no past medical hx (does not go to MD). Hsuband died 2006/08/06 years ago from cancer. Has chronic cough and mild baseline dyspnea that she put down to "chronic bronchitis" but not on active Rx or medical followup. Admitted 07/09/2014 with 2 week history of progressive shortness of breath and cough and is currently being treated for COPD exacerbation. She is some better since admission following bronchodilatation steroids and antibiotics and chest x-ray at admission showed lung mass and this was confirmed with a CT scan of the chest which shows bilateral upper lobe lung mass along with minimally enlarged hilar and mediastinal nodes. Pulmonary critical care has now been consulted for both issues  Consultants:  Pulmonary  Procedures:  None  Antibiotics: Levaquin  HPI/Subjective: Patient complaining of feeling hot, mother by the bedside, denies any chest pain, does have wheezing and chest tightness  Objective: Filed Vitals:   07/09/14 2343 07/10/14 0516 07/10/14 0801 07/10/14 1112  BP:  134/70    Pulse:  78    Temp:  98.1 F (36.7 C)    TempSrc:  Oral    Resp:  18     Height:      Weight:  76.204 kg (168 lb)    SpO2: 91% 91% 93% 94%    Intake/Output Summary (Last 24 hours) at 07/10/14 1341 Last data filed at 07/10/14 1000  Gross per 24 hour  Intake 1031.25 ml  Output    500 ml  Net 531.25 ml    Exam:  General: alert & oriented x 3 In NAD  Cardiovascular: RRR, nl S1 s2  Respiratory: Decreased breath sounds at the bases, wheezing Abdomen: soft +BS NT/ND, no masses palpable  Extremities: No cyanosis and no edema      Data Reviewed: Basic Metabolic Panel:  Recent Labs Lab 07/09/14 1525 07/10/14 0459  NA 142 141  K 3.5* 3.3*  CL 102 100  CO2 26 26  GLUCOSE 98 146*  BUN 7 4*  CREATININE 0.50 0.41*  CALCIUM 8.9 9.1    Liver Function Tests:  Recent Labs Lab 07/10/14 0459  AST 11  ALT 8  ALKPHOS 145*  BILITOT <0.2*  PROT 7.0  ALBUMIN 3.1*   No results for input(s): LIPASE, AMYLASE in the last 168 hours. No results for input(s): AMMONIA in the last 168 hours.  CBC:  Recent Labs Lab 07/09/14 1525  WBC 9.8  NEUTROABS 6.0  HGB 13.6  HCT 41.1  MCV 94.1  PLT 319    Cardiac Enzymes:  Recent Labs Lab 07/09/14 1525  TROPONINI <0.30   BNP (last 3 results) No results for input(s): PROBNP in the last 8760 hours.   CBG: No results for input(s): GLUCAP in the last 168 hours.  No results found for this or  any previous visit (from the past 240 hour(s)).   Studies: Ct Chest W Contrast  07/09/2014   CLINICAL DATA:  Left upper lobe lung mass. History of smoking and cough. Congestion.  EXAM: CT CHEST WITH CONTRAST  TECHNIQUE: Multidetector CT imaging of the chest was performed during intravenous contrast administration.  CONTRAST:  51mL OMNIPAQUE IOHEXOL 300 MG/ML  SOLN  COMPARISON:  Chest x-ray 07/09/2014  FINDINGS: Heart: Heart size is normal. Coronary artery calcifications are noted. No pericardial effusion.  Vascular structures: There is atherosclerotic calcification of the thoracic aorta. No aneurysm or  dissection. Pulmonary arteries are grossly well opacified.  Mediastinum/thyroid: Small mediastinal and hilar lymph nodes are identified. Left hilar lymph node measures 8 mm. Pre vascular lymph node measures 10 mm. Precarinal lymph node measures 12 mm. Right hilar lymph node measures 13 mm. The visualized portion of the thyroid gland has a normal appearance.  Lungs/Airways: Within the left upper lobe there is an irregular spiculated mass which measures 3.3 x 4.3 x 4.2 cm. This is adjacent to the thickened pleura and is suspicious for primary bronchogenic neoplasm. Within the right upper lobe a similar appearing spiculated mass is identified. This measures 1.9 x 2.6 x 2.9 cm. Within the right middle lobe there is irregular density which appears less masslike and measures 2.9 x 3.5 cm. Lungs are hyperinflated. Emphysematous changes are present.  Upper abdomen: The visualized portions of the adrenal glands are unremarkable. Right renal cysts are noted. Gallbladder is present. There is atherosclerotic calcification of the abdominal aorta.  Chest wall/osseous structures: There are degenerative changes in the thoracic spine. No suspicious lytic or blastic lesions are identified.  IMPRESSION: 1. Spiculated mass in the left upper lobe contiguous with thickened pleura measuring 4.3 cm and suspicious for primary bronchogenic neoplasm. 2. A second similar 2.9 cm nodule is identified in the right upper lobe suspicious for a second primary. 3. Small bilateral hilar and mediastinal lymph nodes as described. 4. Emphysema. 5. Normal adrenal glands. 6. Small right renal cysts. 7. Coronary artery calcifications.   Electronically Signed   By: Shon Hale M.D.   On: 07/09/2014 17:03   Dg Chest Port 1 View  07/09/2014   CLINICAL DATA:  Cough and congestion for several days.  EXAM: PORTABLE CHEST - 1 VIEW  COMPARISON:  None.  FINDINGS: Focal masslike opacity in the left upper lobe near the apex with adjacent pleural thickening. This  opacity measures 3.7 cm in diameter.  Coarse reticular and patchy airspace opacity is noted in the right upper lobe.  Lungs are hyperexpanded but otherwise clear. No pleural effusion or pneumothorax.  Cardiac silhouette is normal in size. No mediastinal or hilar masses.  Bony thorax is demineralized but intact.  IMPRESSION: 1. Focal masslike opacity in the left upper lobe near the apex. This may reflect focal pneumonia. Neoplastic disease is also in the differential diagnosis. Patient also has right upper lobe opacity that may reflect scarring or acute infiltrate or a combination. Recommend followup chest CT with contrast for further assessment. 2. Lungs are mildly hyperexpanded.  This suggests underlying COPD.   Electronically Signed   By: Lajean Manes M.D.   On: 07/09/2014 15:25    Scheduled Meds: . budesonide  0.25 mg Nebulization BID  . chlorpheniramine-HYDROcodone  5 mL Oral Q12H  . enoxaparin (LOVENOX) injection  40 mg Subcutaneous Q24H  . ipratropium-albuterol  3 mL Nebulization Q4H  . levofloxacin (LEVAQUIN) IV  500 mg Intravenous QHS  . methylPREDNISolone (SOLU-MEDROL) injection  80 mg Intravenous 3 times per day  . sodium chloride  3 mL Intravenous Q12H   Continuous Infusions: . sodium chloride 75 mL/hr at 07/09/14 2358    Active Problems:   COPD with acute exacerbation   Lung mass   COPD exacerbation   Acute exacerbation of chronic obstructive pulmonary disease (COPD)    Time spent: 40 minutes   [facility] Pager [phone]. If 7PM-7AM, please contact night-coverage at [email or web address], password [facility] 07/10/2014, 1:41 PM  LOS: 1 day

## 2014-07-10 NOTE — Telephone Encounter (Signed)
MR please advise. Kim from [place] called and stated you asked her to  Get Super D on this pt but he pt had her CT at [facility] and they cannot do a Super D. Please advise.  Bing, CMA

## 2014-07-11 ENCOUNTER — Inpatient Hospital Stay (HOSPITAL_COMMUNITY): Payer: Self-pay

## 2014-07-11 DIAGNOSIS — F17200 Nicotine dependence, unspecified, uncomplicated: Secondary | ICD-10-CM

## 2014-07-11 DIAGNOSIS — Z720 Tobacco use: Secondary | ICD-10-CM

## 2014-07-11 MED ORDER — LEVOFLOXACIN 500 MG PO TABS
500.0000 mg | ORAL_TABLET | Freq: Every day | ORAL | Status: DC
  Administered 2014-07-11 – 2014-07-12 (×2): 500 mg via ORAL
  Filled 2014-07-11 (×2): qty 1

## 2014-07-11 MED ORDER — PREDNISONE 20 MG PO TABS
40.0000 mg | ORAL_TABLET | Freq: Every day | ORAL | Status: DC
  Administered 2014-07-11 – 2014-07-12 (×2): 40 mg via ORAL
  Filled 2014-07-11 (×3): qty 2

## 2014-07-11 MED ORDER — GI COCKTAIL ~~LOC~~
30.0000 mL | Freq: Three times a day (TID) | ORAL | Status: DC | PRN
  Administered 2014-07-11: 30 mL via ORAL
  Filled 2014-07-11 (×3): qty 30

## 2014-07-11 MED ORDER — PANTOPRAZOLE SODIUM 40 MG PO TBEC
40.0000 mg | DELAYED_RELEASE_TABLET | Freq: Every day | ORAL | Status: DC
  Administered 2014-07-11 – 2014-07-12 (×2): 40 mg via ORAL
  Filled 2014-07-11 (×3): qty 1

## 2014-07-11 NOTE — Progress Notes (Signed)
Occupational Therapy Evaluation Patient Details Name: Heather Banks MRN: [id number] DOB: March 20, 1951 Today's Date: 07/11/2014    History of Present Illness This 62 year old female was admitted for COPD exacerbation; a lung mass was discovered   Clinical Impression   This 63 year old female was admitted for the above.  She is independent with adls and bathroom transfers.  Educated on energy conservation and re-educated on pursed lip breathing when SOB.  No further OT is needed at this time.    Follow Up Recommendations  No OT follow up    Equipment Recommendations  None recommended by OT    Recommendations for Other Services       Precautions / Restrictions Precautions Precautions:  (check 02 sats) Restrictions Weight Bearing Restrictions: No      Mobility Bed Mobility Overal bed mobility: Independent                Transfers Overall transfer level: Independent                    Balance Overall balance assessment: No apparent balance deficits (not formally assessed)                                          ADL Overall ADL's : Independent                                       General ADL Comments: Pt moves without difficulty, can cross a leg in standing to simulate washing feet.  Took pt off of 02 briefly to ambulate to bathroom.  Sats were 90/91% without 02 and 97% on 3 liters.  Educated pt on energy conservation:  she is used to being independent and being the caregiver.  Her husband died on August 11, 2023, 7 years ago from [place].  She is understandably upset about lung mass but is waiting to hear final outcome.  She states that whatever happens, she will be OK.       Vision                     Perception     Praxis      Pertinent Vitals/Pain Pain Assessment: No/denies pain     Hand Dominance     Extremity/Trunk Assessment Upper Extremity Assessment Upper Extremity Assessment: Overall WFL for tasks assessed            Communication Communication Communication: No difficulties   Cognition Arousal/Alertness: Awake/alert Behavior During Therapy: WFL for tasks assessed/performed Overall Cognitive Status: Within Functional Limits for tasks assessed                     General Comments       Exercises       Shoulder Instructions      Home Living Family/patient expects to be discharged to:: Private residence                                 Additional Comments: pt plans to return to Mother's house.  She has a normal commode and shower (as does pt at her house)      Prior Functioning/Environment Level of Independence: Independent  OT Diagnosis: Generalized weakness   OT Problem List:     OT Treatment/Interventions:      OT Goals(Current goals can be found in the care plan section) Acute Rehab OT Goals Patient Stated Goal: go home tomorrow, without 02  OT Frequency:     Barriers to D/C:            Co-evaluation              End of Session    Activity Tolerance: Patient tolerated treatment well Patient left: in bed;with call bell/phone within reach   Time: 1458-1524 OT Time Calculation (min): 26 min Charges:  OT General Charges $OT Visit: 1 Procedure OT Evaluation $Initial OT Evaluation Tier I: 1 Procedure OT Treatments $Therapeutic Activity: 8-22 mins G-Codes:    Heather Banks 2014-07-18, 3:43 PM Lesle Chris, OTR/L [phone] 2014-07-18

## 2014-07-11 NOTE — Progress Notes (Addendum)
[facility] PROGRESS NOTE  Heather Banks VEL:[id number] DOB: 1950-08-29 DOA: 07/09/2014 PCP: No PCP Per Patient  Assessment/Plan: Active Problems:   COPD with acute exacerbation   Lung mass   COPD exacerbation   Acute exacerbation of chronic obstructive pulmonary disease (COPD)   Smoking   COPD exacerbation Continue current treatment . Patient switched to by mouth steroids, Qvar, by mouth Levaquin Currently requiring high flow oxygen Started on Tussionex for cough PT OT eval   Bilateral upper lobe lung mass Pulmonary recommends SUperD CT chest and PET scan PFT shows gold stage 3 copd - much improved 07/11/14  Endobronchial lung biopsy , timing per pulmonary   Code Status: full Family Communication: family updated about patient's clinical progress Disposition Plan:  PT/OT eval   Brief narrative: 63 year old female With extensive smoking hx and no past medical hx (does not go to MD). Hsuband died 07/23/2006 years ago from cancer. Has chronic cough and mild baseline dyspnea that she put down to "chronic bronchitis" but not on active Rx or medical followup. Admitted 07/09/2014 with 2 week history of progressive shortness of breath and cough and is currently being treated for COPD exacerbation. She is some better since admission following bronchodilatation steroids and antibiotics and chest x-ray at admission showed lung mass and this was confirmed with a CT scan of the chest which shows bilateral upper lobe lung mass along with minimally enlarged hilar and mediastinal nodes. Pulmonary critical care has now been consulted for both issues  Consultants:  Pulmonary  Procedures:  None  Antibiotics: Levaquin  HPI/Subjective: Patient less short of breath today, no nausea vomiting  Objective: Filed Vitals:   07/11/14 0343 07/11/14 0517 07/11/14 0631 07/11/14 0950  BP:  118/62    Pulse:  79    Temp:  98.7 F (37.1 C)    TempSrc:  Oral    Resp:  18     Height:      Weight:   73.71 kg (162 lb 8 oz)   SpO2: 95% 95%  96%    Intake/Output Summary (Last 24 hours) at 07/11/14 1056 Last data filed at 07/11/14 0906  Gross per 24 hour  Intake   1028 ml  Output      0 ml  Net   1028 ml    Exam:  General: alert & oriented x 3 In NAD  Cardiovascular: RRR, nl S1 s2  Respiratory: Decreased breath sounds at the bases, wheezing Abdomen: soft +BS NT/ND, no masses palpable  Extremities: No cyanosis and no edema      Data Reviewed: Basic Metabolic Panel:  Recent Labs Lab 07/09/14 1525 07/10/14 0459  NA 142 141  K 3.5* 3.3*  CL 102 100  CO2 26 26  GLUCOSE 98 146*  BUN 7 4*  CREATININE 0.50 0.41*  CALCIUM 8.9 9.1    Liver Function Tests:  Recent Labs Lab 07/10/14 0459  AST 11  ALT 8  ALKPHOS 145*  BILITOT <0.2*  PROT 7.0  ALBUMIN 3.1*   No results for input(s): LIPASE, AMYLASE in the last 168 hours. No results for input(s): AMMONIA in the last 168 hours.  CBC:  Recent Labs Lab 07/09/14 1525  WBC 9.8  NEUTROABS 6.0  HGB 13.6  HCT 41.1  MCV 94.1  PLT 319    Cardiac Enzymes:  Recent Labs Lab 07/09/14 1525  TROPONINI <0.30   BNP (last 3 results) No results for input(s): PROBNP in the last 8760 hours.   CBG: No  results for input(s): GLUCAP in the last 168 hours.  No results found for this or any previous visit (from the past 240 hour(s)).   Studies: Ct Chest W Contrast  07/09/2014   CLINICAL DATA:  Left upper lobe lung mass. History of smoking and cough. Congestion.  EXAM: CT CHEST WITH CONTRAST  TECHNIQUE: Multidetector CT imaging of the chest was performed during intravenous contrast administration.  CONTRAST:  66mL OMNIPAQUE IOHEXOL 300 MG/ML  SOLN  COMPARISON:  Chest x-ray 07/09/2014  FINDINGS: Heart: Heart size is normal. Coronary artery calcifications are noted. No pericardial effusion.  Vascular structures: There is atherosclerotic calcification of the thoracic aorta. No aneurysm or  dissection. Pulmonary arteries are grossly well opacified.  Mediastinum/thyroid: Small mediastinal and hilar lymph nodes are identified. Left hilar lymph node measures 8 mm. Pre vascular lymph node measures 10 mm. Precarinal lymph node measures 12 mm. Right hilar lymph node measures 13 mm. The visualized portion of the thyroid gland has a normal appearance.  Lungs/Airways: Within the left upper lobe there is an irregular spiculated mass which measures 3.3 x 4.3 x 4.2 cm. This is adjacent to the thickened pleura and is suspicious for primary bronchogenic neoplasm. Within the right upper lobe a similar appearing spiculated mass is identified. This measures 1.9 x 2.6 x 2.9 cm. Within the right middle lobe there is irregular density which appears less masslike and measures 2.9 x 3.5 cm. Lungs are hyperinflated. Emphysematous changes are present.  Upper abdomen: The visualized portions of the adrenal glands are unremarkable. Right renal cysts are noted. Gallbladder is present. There is atherosclerotic calcification of the abdominal aorta.  Chest wall/osseous structures: There are degenerative changes in the thoracic spine. No suspicious lytic or blastic lesions are identified.  IMPRESSION: 1. Spiculated mass in the left upper lobe contiguous with thickened pleura measuring 4.3 cm and suspicious for primary bronchogenic neoplasm. 2. A second similar 2.9 cm nodule is identified in the right upper lobe suspicious for a second primary. 3. Small bilateral hilar and mediastinal lymph nodes as described. 4. Emphysema. 5. Normal adrenal glands. 6. Small right renal cysts. 7. Coronary artery calcifications.   Electronically Signed   By: Shon Hale M.D.   On: 07/09/2014 17:03   Mr Jeri Cos SH Contrast  07/10/2014   CLINICAL DATA:  Left upper lobe lung mass.  EXAM: MRI HEAD WITHOUT AND WITH CONTRAST  TECHNIQUE: Multiplanar, multiecho pulse sequences of the brain and surrounding structures were obtained without and with  intravenous contrast.  CONTRAST:  35mL MULTIHANCE GADOBENATE DIMEGLUMINE 529 MG/ML IV SOLN  COMPARISON:  None.  FINDINGS: There is 5 mm of cerebellar tonsillar ectopia with slightly kinked appearance of the cervicomedullary junction. No evidence of syrinx in the visualized upper cervical spinal cord.  There is no evidence of acute infarct, intracranial hemorrhage, mass, midline shift, or extra-axial fluid collection. Ventricles and sulci are normal for age. No significant white matter disease is seen. There is slight prominence of the cisterna magna. There is no abnormal enhancement.  There is mild bilateral maxillary and sphenoid sinus mucosal thickening with a small amount of fluid noted in the right maxillary and right sphenoid sinuses. Trace bilateral mastoid effusions are present. Major intracranial vascular flow voids are preserved. An 11 x 9 mm anterior cavernous/ paraophthalmic aneurysm is questioned of the distal left internal carotid artery.  IMPRESSION: 1. No evidence of intracranial metastases 2. Mild cerebellar of tonsillar ectopia. 3. Possible 11 mm distal left ICA aneurysm. Nonemergent head MRA or  CTA is recommended for further evaluation.   Electronically Signed   By: Logan Bores   On: 07/10/2014 18:19   Dg Chest Port 1 View  07/09/2014   CLINICAL DATA:  Cough and congestion for several days.  EXAM: PORTABLE CHEST - 1 VIEW  COMPARISON:  None.  FINDINGS: Focal masslike opacity in the left upper lobe near the apex with adjacent pleural thickening. This opacity measures 3.7 cm in diameter.  Coarse reticular and patchy airspace opacity is noted in the right upper lobe.  Lungs are hyperexpanded but otherwise clear. No pleural effusion or pneumothorax.  Cardiac silhouette is normal in size. No mediastinal or hilar masses.  Bony thorax is demineralized but intact.  IMPRESSION: 1. Focal masslike opacity in the left upper lobe near the apex. This may reflect focal pneumonia. Neoplastic disease is also in  the differential diagnosis. Patient also has right upper lobe opacity that may reflect scarring or acute infiltrate or a combination. Recommend followup chest CT with contrast for further assessment. 2. Lungs are mildly hyperexpanded.  This suggests underlying COPD.   Electronically Signed   By: Lajean Manes M.D.   On: 07/09/2014 15:25    Scheduled Meds: . budesonide  0.25 mg Nebulization BID  . enoxaparin (LOVENOX) injection  40 mg Subcutaneous Q24H  . ipratropium-albuterol  3 mL Nebulization Q4H  . levofloxacin  500 mg Oral Daily  . predniSONE  40 mg Oral Q breakfast  . sodium chloride  3 mL Intravenous Q12H   Continuous Infusions:    Active Problems:   COPD with acute exacerbation   Lung mass   COPD exacerbation   Acute exacerbation of chronic obstructive pulmonary disease (COPD)   Smoking    Time spent: 40 minutes   [place] [facility] Pager [phone]. If 7PM-7AM, please contact night-coverage at [email or web address], password [facility] 07/11/2014, 10:56 AM  LOS: 2 days

## 2014-07-11 NOTE — Progress Notes (Signed)
PULMONARY / CRITICAL CARE MEDICINE   Name: Heather Banks MRN: [id number] DOB: 12-26-1950    ADMISSION DATE:  07/09/2014 CONSULTATION DATE:  07/10/14 REFERRING MD :  Dr Allyson Sabal  CHIEF COMPLAINT:   - lung mass  - copd   BRIEF  63 year old female  With extensive smoking hx and no past medical hx (does not go to MD). Hsuband died Jul 19, 2006 years ago from cancer. Has chronic cough and mild baseline dyspnea that she put down to "chronic bronchitis" but not on active Rx or medical followup. Admitted 07/09/2014 with 2 week history of progressive shortness of breath and cough and is currently being treated for COPD exacerbation. She is some better since admission following bronchodilatation steroids and antibiotics and chest x-ray at admission showed lung mass and this was confirmed with a CT scan of the chest which shows bilateral upper lobe lung mass along with minimally enlarged hilar and mediastinal nodes. Pulmonary critical care has now been consulted for both issues   SIGNIFICANT EVENTS: 07/09/2014 - admit 07/10/14 - pccm consult  SUBJECTIVE:  07/11/14: Much better after nebs increased. PFTshow fev1  1.2L/46% - gold stage 3.  [id number] [place].Feels near baseline. MRI brain  - no mass  VITAL SIGNS: Temp:  [98.1 F (36.7 C)-98.7 F (37.1 C)] 98.7 F (37.1 C) (12/15 0517) Pulse Rate:  [79-101] 79 (12/15 0517) Resp:  [18-20] 18 (12/15 0517) BP: (118-143)/(62-78) 118/62 mmHg (12/15 0517) SpO2:  [91 %-95 %] 95 % (12/15 0517) Weight:  [73.71 kg (162 lb 8 oz)-76.204 kg (168 lb)] 73.71 kg (162 lb 8 oz) (12/15 0631) HEMODYNAMICS:   VENTILATOR SETTINGS:   INTAKE / OUTPUT:  Intake/Output Summary (Last 24 hours) at 07/11/14 0805 Last data filed at 07/10/14 2300  Gross per 24 hour  Intake   1265 ml  Output      0 ml  Net   1265 ml    PHYSICAL EXAMINATION: General:  Barrel chested woman sitting in her bed. In no distress Neuro:  Alert and oriented 3, speech is normal, moves all 4  extremities HEENT:  Neck is supple no neck nodes Cardiovascular:  S1-S2 present regular rate and rhythm no murmurs Lungs:  Barrel chested, mild pursed lip breathing and  NO  wheezing  present Abdomen:  Soft, nontender no masses no organomegaly Musculoskeletal:  No cyanosis no clubbing no edema Skin:  Appears intact  LABS:  CBC  Recent Labs Lab 07/09/14 1525  WBC 9.8  HGB 13.6  HCT 41.1  PLT 319   Coag's  Recent Labs Lab 07/09/14 2349 07/10/14 0459  APTT  --  40*  INR 0.90  --    BMET  Recent Labs Lab 07/09/14 1525 07/10/14 0459  NA 142 141  K 3.5* 3.3*  CL 102 100  CO2 26 26  BUN 7 4*  CREATININE 0.50 0.41*  GLUCOSE 98 146*   Electrolytes  Recent Labs Lab 07/09/14 1525 07/10/14 0459  CALCIUM 8.9 9.1   Sepsis Markers No results for input(s): LATICACIDVEN, PROCALCITON, O2SATVEN in the last 168 hours. ABG  Recent Labs Lab 07/10/14 1115  PHART 7.427  PCO2ART 44.5  PO2ART 61.7*   Liver Enzymes  Recent Labs Lab 07/10/14 0459  AST 11  ALT 8  ALKPHOS 145*  BILITOT <0.2*  ALBUMIN 3.1*   Cardiac Enzymes  Recent Labs Lab 07/09/14 1525  TROPONINI <0.30   Glucose No results for input(s): GLUCAP in the last 168 hours.  Imaging Mr Jeri Cos Wo Contrast  07/10/2014   CLINICAL DATA:  Left upper lobe lung mass.  EXAM: MRI HEAD WITHOUT AND WITH CONTRAST  TECHNIQUE: Multiplanar, multiecho pulse sequences of the brain and surrounding structures were obtained without and with intravenous contrast.  CONTRAST:  5mL MULTIHANCE GADOBENATE DIMEGLUMINE 529 MG/ML IV SOLN  COMPARISON:  None.  FINDINGS: There is 5 mm of cerebellar tonsillar ectopia with slightly kinked appearance of the cervicomedullary junction. No evidence of syrinx in the visualized upper cervical spinal cord.  There is no evidence of acute infarct, intracranial hemorrhage, mass, midline shift, or extra-axial fluid collection. Ventricles and sulci are normal for age. No significant white  matter disease is seen. There is slight prominence of the cisterna magna. There is no abnormal enhancement.  There is mild bilateral maxillary and sphenoid sinus mucosal thickening with a small amount of fluid noted in the right maxillary and right sphenoid sinuses. Trace bilateral mastoid effusions are present. Major intracranial vascular flow voids are preserved. An 11 x 9 mm anterior cavernous/ paraophthalmic aneurysm is questioned of the distal left internal carotid artery.  IMPRESSION: 1. No evidence of intracranial metastases 2. Mild cerebellar of tonsillar ectopia. 3. Possible 11 mm distal left ICA aneurysm. Nonemergent head MRA or CTA is recommended for further evaluation.   Electronically Signed   By: Logan Bores   On: 07/10/2014 18:19     ASSESSMENT / PLAN:  PULMONARY  A: #Acute on chronic respiratory failure secondary to COPD exacerbation   - PFT shows gold stage 3 copd  - much improved 07/11/14    - REc   - continue BD- dc on duoneb + MDI QVAR (cheapest)   -change  Solumedrol to pred and taper over next 12 days from 07/11/14   - change IV levaquin to po levaquin (total 5-7 days)  #Bilateral upper lobe lung mass - likely lung cancer - well could be stage 4 with 2 nodules in contralateral lobes  - MRI brain negative 07/10/14  - Needs PET scan (ordered as inpatient but dobut can be done)  - Needs SUperD CT chest (first CT chest cannot be converted to one) - so ordered - to help plan for future bronch; can getthis as inpatient  #Smoking  - needs to quit; adivsed    CARDIOVASCULAR  A: Coronary arterly calcification with trop x 1 negative P:  Needs OPD cardiac stress test- [facility] to set this up at dc  [facility] Dc tele  Pulmonary followup setup with NP Tammy Parrett 07/31/14 at 14.30. PCCM will sign off   Future Appointments Date Time Provider [place]  07/31/2014 2:30 PM Tammy Jeralene Huff, NP [facility] None         Dr. Brand Males, M.D.,  [facility].C.P Pulmonary and Critical Care Medicine Staff Physician [place] [facility] Pager: [phone], If no answer or between  15:00h - 7:00h: call [phone]  07/11/2014 8:05 AM

## 2014-07-12 ENCOUNTER — Telehealth: Payer: Self-pay | Admitting: Internal Medicine

## 2014-07-12 ENCOUNTER — Other Ambulatory Visit (HOSPITAL_COMMUNITY): Payer: Self-pay

## 2014-07-12 DIAGNOSIS — R918 Other nonspecific abnormal finding of lung field: Secondary | ICD-10-CM

## 2014-07-12 MED ORDER — LEVOFLOXACIN 500 MG PO TABS: 500.0000 mg | ORAL_TABLET | Freq: Every day | ORAL | Status: DC

## 2014-07-12 MED ORDER — POTASSIUM CHLORIDE CRYS ER 20 MEQ PO TBCR
40.0000 meq | EXTENDED_RELEASE_TABLET | Freq: Once | ORAL | Status: AC
  Administered 2014-07-12: 40 meq via ORAL
  Filled 2014-07-12: qty 2

## 2014-07-12 MED ORDER — NICOTINE 21 MG/24HR TD PT24: 21.0000 mg | MEDICATED_PATCH | Freq: Every day | TRANSDERMAL | Status: DC

## 2014-07-12 MED ORDER — ALBUTEROL SULFATE (2.5 MG/3ML) 0.083% IN NEBU: 2.5000 mg | INHALATION_SOLUTION | Freq: Four times a day (QID) | RESPIRATORY_TRACT | Status: DC | PRN

## 2014-07-12 MED ORDER — PREDNISONE 20 MG PO TABS: 40.0000 mg | ORAL_TABLET | Freq: Every day | ORAL | Status: DC

## 2014-07-12 MED ORDER — PANTOPRAZOLE SODIUM 40 MG PO TBEC: 40.0000 mg | DELAYED_RELEASE_TABLET | Freq: Every day | ORAL | Status: DC

## 2014-07-12 MED ORDER — HYDROCODONE-ACETAMINOPHEN 5-325 MG PO TABS: 1.0000 | ORAL_TABLET | Freq: Four times a day (QID) | ORAL | Status: DC | PRN

## 2014-07-12 MED ORDER — BUDESONIDE 0.25 MG/2ML IN SUSP: 0.2500 mg | Freq: Two times a day (BID) | RESPIRATORY_TRACT | Status: DC

## 2014-07-12 MED ORDER — HYDROCOD POLST-CHLORPHEN POLST 10-8 MG/5ML PO LQCR: 5.0000 mL | Freq: Two times a day (BID) | ORAL | Status: DC | PRN

## 2014-07-12 NOTE — Progress Notes (Signed)
PT Cancellation Note  Patient Details Name: Heather Banks MRN: [id number] DOB: 24-Aug-1950   Cancelled Treatment:    Reason Eval/Treat Not Completed: PT screened, no needs identified, will sign off. Spoke with pt and RN who had just finished walking with pt in hallway-no PT needs. Will sign off.    Weston Anna, MPT Pager: [phone]

## 2014-07-12 NOTE — Telephone Encounter (Signed)
Heather Banks (cc to [facility])  This patient is inpatient as of 07/12/2014 . Will see [name] 14/16. Once she is discharged she needs a PET scan . She lives up [place] and therefore if she can do her PET scan on day of seeing Tammy before or afrter will help. Please track patient and order PET aftert discharge  Depending on PET scan results - I will refer for myself to do EBUS or [employer] to do CT guided TTNA or [place] to do ENB  Thanks  Dr. Brand Males, M.D., [facility].C.P Pulmonary and Critical Care Medicine Staff Physician [place] [facility] Pager: [phone], If no answer or between  15:00h - 7:00h: call [phone]  07/12/2014 10:33 AM

## 2014-07-12 NOTE — Care Management Note (Signed)
    Page 1 of 1   07/12/2014     11:49:49 AM CARE MANAGEMENT NOTE 07/12/2014  Patient:  Heather Banks, Heather Banks   Account Number:  [id number]  Date Initiated:  07/12/2014  Documentation initiated by:  Sunday Spillers  Subjective/Objective Assessment:   63 yo female admitted with COPD exacerbation. PTA lived at home with mother, caregiver for mother.     Action/Plan:   Home when stable   Anticipated DC Date:  07/12/2014   Anticipated DC Plan:  [place]  CM consult      PAC Choice  DURABLE MEDICAL EQUIPMENT   Choice offered to / List presented to:     DME arranged  [facility]      DME agency  [place].        Status of service:  In process, will continue to follow Medicare Important Message given?   (If response is "NO", the following Medicare IM given date fields will be blank) Date Medicare IM given:   Medicare IM given by:   Date Additional Medicare IM given:   Additional Medicare IM given by:    Discharge Disposition:  HOME/SELF CARE  Per UR Regulation:  Reviewed for med. necessity/level of care/duration of stay  If discussed at [place] of Stay Meetings, dates discussed:    Comments:  07-12-14 [place] [phone] Spoke with patient at bedside regarding d/c plan. Patient lives in [place] but is her living with her mother currently and plans to return to mothers house. May need O2 as desats with activity, qualifying sats per RN in Epic. Discussed options with patient, agrees to proceed with initating O2 for home use with [facility], contacted [facility] to arrange with patient, discuss financial issues. Patient does not currently take home meds but states does have a nebulizer. Will follow for medication needs, awaiting final meds and final O2 orders.

## 2014-07-12 NOTE — Progress Notes (Signed)
O2 Sats at rest  On 3Lnc - 93% O2 Sats at rest OFF O2 - 89% O2 Sats  OFF O2  While ambulating - 83%

## 2014-07-12 NOTE — Progress Notes (Signed)
Patient discharge to home, with home O2 @ 3L [place].. D/c instructions and follow up appointments done and was given to the patient, verbalized understanding.  PIV removed by NT no s/s of infiltration or swelling noted.

## 2014-07-12 NOTE — Discharge Summary (Signed)
Physician Discharge Summary  Retha Bither MRN: [id number] DOB/AGE: 61-12-1950 61 y.o.  PCP: No PCP Per Patient   Admit date: 07/09/2014 Discharge date: 07/12/2014  Discharge Diagnoses:     COPD with acute exacerbation   Lung mass   COPD exacerbation   Acute exacerbation of chronic obstructive pulmonary disease (COPD)   Smoking   Follow-up recommendations Follow-up with PCP in 5-7 days Follow-up with pulmonary, please call to confirm appointment     Medication List    TAKE these medications        albuterol (2.5 MG/3ML) 0.083% nebulizer solution  Commonly known as:  PROVENTIL  Take 3 mLs (2.5 mg total) by nebulization every 6 (six) hours as needed for wheezing or shortness of breath.     budesonide 0.25 MG/2ML nebulizer solution  Commonly known as:  PULMICORT  Take 2 mLs (0.25 mg total) by nebulization 2 (two) times daily.     chlorpheniramine-HYDROcodone 10-8 MG/5ML Lqcr  Commonly known as:  TUSSIONEX  Take 5 mLs by mouth every 12 (twelve) hours as needed for cough.     HYDROcodone-acetaminophen 5-325 MG per tablet  Commonly known as:  NORCO/VICODIN  Take 1 tablet by mouth every 6 (six) hours as needed for moderate pain or severe pain.     levofloxacin 500 MG tablet  Commonly known as:  LEVAQUIN  Take 1 tablet (500 mg total) by mouth daily.     multivitamin with minerals tablet  Take 1 tablet by mouth daily.     nicotine 21 mg/24hr patch  Commonly known as:  NICODERM CQ  Place 1 patch (21 mg total) onto the skin daily.     pantoprazole 40 MG tablet  Commonly known as:  PROTONIX  Take 1 tablet (40 mg total) by mouth daily.     predniSONE 20 MG tablet  Commonly known as:  DELTASONE  Take 2 tablets (40 mg total) by mouth daily with breakfast.        Discharge Condition: Stable Disposition: Final discharge disposition not confirmed   Consults: Pulmonology   Significant Diagnostic Studies: Ct Chest W Contrast  07/09/2014   CLINICAL DATA:   Left upper lobe lung mass. History of smoking and cough. Congestion.  EXAM: CT CHEST WITH CONTRAST  TECHNIQUE: Multidetector CT imaging of the chest was performed during intravenous contrast administration.  CONTRAST:  51mL OMNIPAQUE IOHEXOL 300 MG/ML  SOLN  COMPARISON:  Chest x-ray 07/09/2014  FINDINGS: Heart: Heart size is normal. Coronary artery calcifications are noted. No pericardial effusion.  Vascular structures: There is atherosclerotic calcification of the thoracic aorta. No aneurysm or dissection. Pulmonary arteries are grossly well opacified.  Mediastinum/thyroid: Small mediastinal and hilar lymph nodes are identified. Left hilar lymph node measures 8 mm. Pre vascular lymph node measures 10 mm. Precarinal lymph node measures 12 mm. Right hilar lymph node measures 13 mm. The visualized portion of the thyroid gland has a normal appearance.  Lungs/Airways: Within the left upper lobe there is an irregular spiculated mass which measures 3.3 x 4.3 x 4.2 cm. This is adjacent to the thickened pleura and is suspicious for primary bronchogenic neoplasm. Within the right upper lobe a similar appearing spiculated mass is identified. This measures 1.9 x 2.6 x 2.9 cm. Within the right middle lobe there is irregular density which appears less masslike and measures 2.9 x 3.5 cm. Lungs are hyperinflated. Emphysematous changes are present.  Upper abdomen: The visualized portions of the adrenal glands are unremarkable. Right renal cysts are noted.  Gallbladder is present. There is atherosclerotic calcification of the abdominal aorta.  Chest wall/osseous structures: There are degenerative changes in the thoracic spine. No suspicious lytic or blastic lesions are identified.  IMPRESSION: 1. Spiculated mass in the left upper lobe contiguous with thickened pleura measuring 4.3 cm and suspicious for primary bronchogenic neoplasm. 2. A second similar 2.9 cm nodule is identified in the right upper lobe suspicious for a second  primary. 3. Small bilateral hilar and mediastinal lymph nodes as described. 4. Emphysema. 5. Normal adrenal glands. 6. Small right renal cysts. 7. Coronary artery calcifications.   Electronically Signed   By: Shon Hale M.D.   On: 07/09/2014 17:03   Mr Heather Banks AY Contrast  07/10/2014   CLINICAL DATA:  Left upper lobe lung mass.  EXAM: MRI HEAD WITHOUT AND WITH CONTRAST  TECHNIQUE: Multiplanar, multiecho pulse sequences of the brain and surrounding structures were obtained without and with intravenous contrast.  CONTRAST:  49mL MULTIHANCE GADOBENATE DIMEGLUMINE 529 MG/ML IV SOLN  COMPARISON:  None.  FINDINGS: There is 5 mm of cerebellar tonsillar ectopia with slightly kinked appearance of the cervicomedullary junction. No evidence of syrinx in the visualized upper cervical spinal cord.  There is no evidence of acute infarct, intracranial hemorrhage, mass, midline shift, or extra-axial fluid collection. Ventricles and sulci are normal for age. No significant white matter disease is seen. There is slight prominence of the cisterna magna. There is no abnormal enhancement.  There is mild bilateral maxillary and sphenoid sinus mucosal thickening with a small amount of fluid noted in the right maxillary and right sphenoid sinuses. Trace bilateral mastoid effusions are present. Major intracranial vascular flow voids are preserved. An 11 x 9 mm anterior cavernous/ paraophthalmic aneurysm is questioned of the distal left internal carotid artery.  IMPRESSION: 1. No evidence of intracranial metastases 2. Mild cerebellar of tonsillar ectopia. 3. Possible 11 mm distal left ICA aneurysm. Nonemergent head MRA or CTA is recommended for further evaluation.   Electronically Signed   By: Logan Bores   On: 07/10/2014 18:19   Dg Chest Port 1 View  07/09/2014   CLINICAL DATA:  Cough and congestion for several days.  EXAM: PORTABLE CHEST - 1 VIEW  COMPARISON:  None.  FINDINGS: Focal masslike opacity in the left upper lobe near  the apex with adjacent pleural thickening. This opacity measures 3.7 cm in diameter.  Coarse reticular and patchy airspace opacity is noted in the right upper lobe.  Lungs are hyperexpanded but otherwise clear. No pleural effusion or pneumothorax.  Cardiac silhouette is normal in size. No mediastinal or hilar masses.  Bony thorax is demineralized but intact.  IMPRESSION: 1. Focal masslike opacity in the left upper lobe near the apex. This may reflect focal pneumonia. Neoplastic disease is also in the differential diagnosis. Patient also has right upper lobe opacity that may reflect scarring or acute infiltrate or a combination. Recommend followup chest CT with contrast for further assessment. 2. Lungs are mildly hyperexpanded.  This suggests underlying COPD.   Electronically Signed   By: Lajean Manes M.D.   On: 07/09/2014 15:25   Ct Super D Chest Wo Contrast  07/11/2014   CLINICAL DATA:  Lung mass on CT and radiograph  EXAM: CT CHEST WITHOUT CONTRAST  TECHNIQUE: Multidetector CT imaging of the chest was performed using thin slice collimation for electromagnetic bronchoscopy planning purposes, without intravenous contrast.  COMPARISON:  CT 07/09/2014  FINDINGS: Again demonstrated left upper lobe mass measuring 3.1x 2.8 cm. Right  upper lobe spiculated nodule measuring 18 x 16 mm. Linear fibrotic reticulation in the inferior right middle lobe on image 20.  No axillary or supraclavicular lymphadenopathy. No discrete mediastinal adenopathy on this noncontrast exam. Normal esophagus. . Limited upper abdomen demonstrates normal adrenal glands. Low-density lesions in the posterior aspect of the right kidney. No aggressive osseous lesion  IMPRESSION: 1. Left upper lobe mass. 2. Right upper lobe nodule. 3. Both lesions are suspicious for bronchogenic carcinoma.   Electronically Signed   By: Suzy Bouchard M.D.   On: 07/11/2014 14:58      Microbiology: No results found for this or any previous visit (from the past  240 hour(s)).   Labs: No results found for this or any previous visit (from the past 48 hour(s)).   HPI :63 year old female With extensive smoking hx and no past medical hx (does not go to MD). Hsuband died 2006/07/24 years ago from cancer. Has chronic cough and mild baseline dyspnea that she put down to "chronic bronchitis" but not on active Rx or medical followup. Admitted 07/09/2014 with 2 week history of progressive shortness of breath and cough and is currently being treated for COPD exacerbation. She is some better since admission following bronchodilatation steroids and antibiotics and chest x-ray at admission showed lung mass and this was confirmed with a CT scan of the chest which shows bilateral upper lobe lung mass along with minimally enlarged hilar and mediastinal nodes. Pulmonary critical care has now been consulted for both issues  HOSPITAL COURSE: * COPD exacerbation Patient started on treatment with IV steroids, now switched to by mouth steroids and by mouth antibiotics Patient home on levofloxacin, home oxygen at 3 L Nebulizer device to be set up at home Continue Tussionex for cough Slowly improving Nicotine patch provided and smoking cessation counseling done    Bilateral upper lobe lung mass Consultanted pulmonary -Brand Males, MD, patient will follow-up [name] ,she needs a PET scan . She lives up [place] and therefore if she can do her PET scan on day of seeing Tammy before or after seeing pulmonary. Pulmonary to track patient and order PET aftert discharge Depending on PET scan results -patient needs endobronchial ultrasound or Radioogy to do CT guided TTNA or [place] to do bronchial endoscopy PFT shows gold stage 3 copd - much improved 07/11/14  Outpatient PET scan Patient has been provided with a contact number for the pulmonary office   Discharge Exam: *Blood pressure 141/81, pulse 77, temperature 97.6 F (36.4 C), temperature source Oral,  resp. rate 20, height 5\' 5"  (1.651 m), weight 73.71 kg (162 lb 8 oz), SpO2 99 %.    General: alert & oriented x 3 In NAD   Cardiovascular: RRR, nl S1 s2   Respiratory: Decreased breath sounds at the bases, wheezing  Abdomen: soft +BS NT/ND, no masses palpable   Extremities: No cyanosis and no edema      Discharge Instructions    Diet - low sodium heart healthy    Complete by:  As directed      Diet - low sodium heart healthy    Complete by:  As directed      Diet - low sodium heart healthy    Complete by:  As directed      Increase activity slowly    Complete by:  As directed      Increase activity slowly    Complete by:  As directed      Increase activity  slowly    Complete by:  As directed            Follow-up Information    Follow up with pcp. Schedule an appointment as soon as possible for a visit in 1 week.      Follow up with [facility], [place]. Call in 1 week.   Specialty:  Pulmonary Disease   Contact information:   [place] [address] [phone]       Signed: Reyne Dumas 07/12/2014, 2:14 PM

## 2014-07-14 NOTE — Telephone Encounter (Signed)
ATC unable to leave VM. WCB

## 2014-07-18 NOTE — Telephone Encounter (Signed)
ATC pt. Unable to leave vm.  ATC [facility] x 3 and the line was busy.  Called and spoke to [employer] and was given the already existing numbers.   WCB

## 2014-07-18 NOTE — Telephone Encounter (Signed)
Pt returned call. Informed pt of the order. Pt verbalized understanding and denied any further questions or concerns at this time.   PCC's, if possible, please schedules pt's PET scan on same day as f/u with TP on 07/31/14 d/t pt living far away.

## 2014-07-28 HISTORY — PX: PORTACATH PLACEMENT: SHX2246

## 2014-07-31 ENCOUNTER — Encounter: Payer: Self-pay | Admitting: Adult Health

## 2014-07-31 ENCOUNTER — Ambulatory Visit (HOSPITAL_COMMUNITY)
Admission: RE | Admit: 2014-07-31 | Discharge: 2014-07-31 | Disposition: A | Discharge status: Home / Self Care | Payer: Medicaid - Out of State | Source: Ambulatory Visit | Attending: Internal Medicine | Admitting: Internal Medicine

## 2014-07-31 ENCOUNTER — Ambulatory Visit (INDEPENDENT_AMBULATORY_CARE_PROVIDER_SITE_OTHER): Payer: Self-pay | Admitting: Adult Health

## 2014-07-31 ENCOUNTER — Telehealth: Payer: Self-pay | Admitting: Internal Medicine

## 2014-07-31 VITALS — BP 134/84 | HR 86 | Temp 98.3°F | Ht 65.0 in | Wt 162.8 lb

## 2014-07-31 DIAGNOSIS — R918 Other nonspecific abnormal finding of lung field: Secondary | ICD-10-CM | POA: Diagnosis present

## 2014-07-31 DIAGNOSIS — J441 Chronic obstructive pulmonary disease with (acute) exacerbation: Secondary | ICD-10-CM

## 2014-07-31 LAB — GLUCOSE, CAPILLARY: Glucose-Capillary: 101 mg/dL — ABNORMAL HIGH (ref 70–99)

## 2014-07-31 MED ORDER — FLUTICASONE FUROATE-VILANTEROL 200-25 MCG/INH IN AEPB: 1.0000 | INHALATION_SPRAY | Freq: Every day | RESPIRATORY_TRACT | Status: DC

## 2014-07-31 MED ORDER — FLUDEOXYGLUCOSE F - 18 (FDG) INJECTION
9.0000 | Freq: Once | INTRAVENOUS | Status: AC | PRN
  Administered 2014-07-31: 9 via INTRAVENOUS

## 2014-07-31 NOTE — Addendum Note (Signed)
Addended by: Mathis Dad on: 07/31/2014 03:26 PM   Modules accepted: Orders

## 2014-07-31 NOTE — Addendum Note (Signed)
Addended by: Parke Poisson E on: 07/31/2014 05:37 PM   Modules accepted: Orders, Medications

## 2014-07-31 NOTE — Assessment & Plan Note (Signed)
CT chest > Left upper lobe mass. 2. Right upper lobe nodule. 3. Both lesions are suspicious for bronchogenic carcinoma.  PET is pending , results will depend on next step.

## 2014-07-31 NOTE — Addendum Note (Signed)
Addended by: Parke Poisson E on: 07/31/2014 03:36 PM   Modules accepted: Orders

## 2014-07-31 NOTE — Assessment & Plan Note (Signed)
Recent exacerbation, now resolved Unfortunately, patient does not have any medical insurance We'll try Breo and apply for patient assistance program Encouraged on smoking cessation  Plan  When you finish Pulmicort Nebs- Begin BREO 1 puff daily , rinse after use.  Fill out patient assistance forms for BREO and send in.  Continue to work on stopping smoking  I will call with PET results.  Follow up Dr. Chase Caller in 4-6 weeks and As needed

## 2014-07-31 NOTE — Progress Notes (Signed)
   Subjective:    Patient ID: Heather Banks, female    DOB: 06/03/1951, 64 y.o.   MRN: [id number]  HPI 64 yo seen for pulmonary consult during hospitalization for COPD exacerbation . Found to have a lung mass.   07/31/2014 [facility] follow up  Admitted 12/13 for COPD exacerbation.  CT showed bilateral upper lobe lung mass w/ minmally enlarged hilar and mediastinal nodes.  She was treated with IV antibiotics, steroids, and nebulized bronchodilators. She was discharged on a prednisone taper and Levaquin. She was discharged on oxygen at 3 L. PFT showed an FEV1 at 46%, ratio of 59. Diffusing capacity was decreased at 56% , FVC was 60% Patient is active smoker but has cut down She is maintained on Pulmicort nebulizer twice daily No formal dx of COPD before hospital admissioin.  Does not have any insurance . Paid $800 for meds.  Is looking into disability and medicaid in [place].  Since discharge she is feeling better, less dyspnea, cough .  Reports breathing is doing well since discharge.  Had PET earlier this afternoon.   Reports smoking is decreased to 1-2cigs per days Gets winded with activity , wears out easily .     Review of Systems Constitutional:   No  weight loss, night sweats,  Fevers, chills, + fatigue, or  lassitude.  HEENT:   No headaches,  Difficulty swallowing,  Tooth/dental problems, or  Sore throat,                No sneezing, itching, ear ache, + nasal congestion, post nasal drip,   CV:  No chest pain,  Orthopnea, PND, swelling in lower extremities, anasarca, dizziness, palpitations, syncope.   GI  No heartburn, indigestion, abdominal pain, nausea, vomiting, diarrhea, change in bowel habits, loss of appetite, bloody stools.   Resp:  .  No chest wall deformity  Skin: no rash or lesions.  GU: no dysuria, change in color of urine, no urgency or frequency.  No flank pain, no hematuria   MS:  No joint pain or swelling.  No decreased range of motion.  No back  pain.  Psych:  No change in mood or affect. No depression or anxiety.  No memory loss.          Objective:   Physical Exam GEN: A/Ox3; pleasant , NAD, elderly   HEENT:  [place]/AT,  EACs-clear, TMs-wnl, NOSE-clear,edentulous  THROAT-clear, no lesions, no postnasal drip or exudate noted.   NECK:  Supple w/ fair ROM; no JVD; normal carotid impulses w/o bruits; no thyromegaly or nodules palpated; no lymphadenopathy.  RESP  Decreased BS in bases no accessory muscle use, no dullness to percussion  CARD:  RRR, no m/r/g  , no peripheral edema, pulses intact, no cyanosis or clubbing.  GI:   Soft & nt; nml bowel sounds; no organomegaly or masses detected.  Musco: Warm bil, no deformities or joint swelling noted.   Neuro: alert, no focal deficits noted.    Skin: Warm, no lesions or rashes   07/11/14  Left upper lobe mass. 2. Right upper lobe nodule. 3. Both lesions are suspicious for bronchogenic carcinoma.      Assessment & Plan:

## 2014-07-31 NOTE — Patient Instructions (Signed)
When you finish Pulmicort Nebs- Begin BREO 1 puff daily , rinse after use.  Fill out patient assistance forms for BREO and send in.  Continue to work on stopping smoking  I will call with PET results.  Follow up Dr. Chase Caller in 4-6 weeks and As needed

## 2014-07-31 NOTE — Telephone Encounter (Signed)
  Heather Banks (cc to [employer])  This is the lady who was in hospital That I Thought you should do ENB. AECOPD now resolved.  Uninsured. SHe has had PET. No mediastinal nodes. Please see if you want to approach both nodules with a single ENB instead of one CT guided TTNA and ENB for another. I prefer one single ENB for both nodules. SHe had a super D CT at [facility] if you want you can get it from there or I can request for you  THanks  Dr. Brand Males, M.D., [facility].C.P Pulmonary and Critical Care Medicine Staff Physician [facility] Pager: [phone], If no answer or between  15:00h - 7:00h: call [phone]  07/31/2014 5:31 PM     Nm Pet Image Initial (pi) Skull Base To Thigh  07/31/2014   CLINICAL DATA:  Initial treatment strategy for left upper lobe lung mass and right upper lobe nodule.  EXAM: NUCLEAR MEDICINE PET SKULL BASE TO THIGH  TECHNIQUE: 9.0 mCi F-18 FDG was injected intravenously. Full-ring PET imaging was performed from the skull base to thigh after the radiotracer. CT data was obtained and used for attenuation correction and anatomic localization.  FASTING BLOOD GLUCOSE:  Value: 101 mg/dl  COMPARISON:  07/11/2014  FINDINGS: NECK  No hypermetabolic lymph nodes in the neck.  CHEST  Spiculated 3.7 by 3.5 cm left apical lung mass observed, maximum standard uptake value 22.6. No definite chest wall invasion. No adjacent rib destruction.  Marginally spiculated 2.3 by 1.8 cm right upper  No hypermetabolic adenopathy observed in the chest. Lobe pulmonary nodule, image 19 series 6, maximum standard uptake value 13.9.  Bandlike region of architectural distortion anteriorly in the right upper lobe has a maximum standard uptake value of 2.5, concerning for malignancy given the lack of a well-defined solid component.  ABDOMEN/PELVIS  No abnormal hypermetabolic activity within the liver, pancreas, adrenal glands, or spleen. No hypermetabolic lymph  nodes in the abdomen or pelvis. Aortoiliac atherosclerotic vascular disease.  SKELETON  No focal hypermetabolic activity to suggest skeletal metastasis.  IMPRESSION: 1. Left apical and right upper lobe hypermetabolic spiculated pulmonary nodules, highly suspicious for malignancy. Active granulomatous process such as tuberculosis possible but less likely. There is also a band of architectural distortion anteriorly in the right upper lobe which is mildly hypermetabolic and probably malignant as well. No significant pathologic adenopathy in the chest. If this is non-small cell lung cancer, then the bilaterality indicates stage IV disease.   Electronically Signed   By: Sherryl Barters M.D.   On: 07/31/2014 15:40

## 2014-08-01 NOTE — Telephone Encounter (Signed)
Sharyn Lull, pl ask this pt to see me on Thursday am

## 2014-08-01 NOTE — Telephone Encounter (Signed)
Patient returning call.  [phone]

## 2014-08-01 NOTE — Telephone Encounter (Signed)
Per Tammy, scheduled patient with Dr. Elsworth Soho on Thursday, 1/7.  Nothing further needed.

## 2014-08-01 NOTE — Telephone Encounter (Signed)
Spoke with pt , aware of PET results  She is waiting on Sharyn Lull to call with ov with Dr. Elsworth Soho  On Thursday .  Thanks  Tam

## 2014-08-03 ENCOUNTER — Ambulatory Visit (INDEPENDENT_AMBULATORY_CARE_PROVIDER_SITE_OTHER): Payer: Self-pay | Admitting: Pulmonary Disease

## 2014-08-03 ENCOUNTER — Encounter: Payer: Self-pay | Admitting: Pulmonary Disease

## 2014-08-03 VITALS — BP 146/90 | HR 93 | Temp 98.2°F | Ht 65.0 in | Wt 164.2 lb

## 2014-08-03 DIAGNOSIS — R918 Other nonspecific abnormal finding of lung field: Secondary | ICD-10-CM

## 2014-08-03 DIAGNOSIS — J441 Chronic obstructive pulmonary disease with (acute) exacerbation: Secondary | ICD-10-CM

## 2014-08-03 NOTE — Assessment & Plan Note (Signed)
Appears to have resolved. Smoking cessation remains primary intervention. Stay on breo meantime

## 2014-08-03 NOTE — Patient Instructions (Signed)
Congratulations on cutting down smoking We discussed biopsy procedure, risks & benefits We will call you once scheduled

## 2014-08-03 NOTE — Assessment & Plan Note (Signed)
Proceed with ENB Alternative - CT guided biopsy discussed The various options of biopsy including bronchoscopy, CT guided needle aspiration and surgical biopsy were discussed.The risks of each procedure including coughing, bleeding and the  chances of lung puncture requiring chest tube were discussed in great detail. The benefits & alternatives including serial follow up were also discussed.

## 2014-08-03 NOTE — Progress Notes (Signed)
   Subjective:    Patient ID: Heather Banks, female    DOB: [birth year], 64 y.o.   MRN: [id number]  HPI  64 yo smoker seen for pulmonary consult during hospitalization for COPD exacerbation . Found to have a lung mass.   Admitted 06/1314 for COPD exacerbation.  CT showed bilateral upper lobe lung mass w/ minmally enlarged hilar and mediastinal nodes.  She was treated with IV antibiotics, steroids, and nebulized bronchodilators. She was discharged on a prednisone taper and Levaquin. She was discharged on oxygen at 3 L. PFT showed an FEV1 at 46%, ratio of 59. Diffusing capacity was decreased at 56% , FVC was 60%  She is maintained on Pulmicort nebulizer twice daily No formal dx of COPD before hospital admissioin.  Does not have any insurance . Paid $800 for meds.  Is looking into disability and medicaid in [place].   She is feeling much improved, less dyspnea, cough .  Reports breathing is doing well since discharge.  PET scan reviewed- hypermetabolic lung masses without mediastinal lymph nodes,No other areas lighted up  Reports smoking is decreased to 1-2cigs per days Gets winded with activity , wears out easily  Uses oxygen at night  Review of Systems Pt denies any significant  nasal congestion or excess secretions, fever, chills, sweats, unintended wt loss, pleuritic or exertional cp, orthopnea pnd or leg swelling.  Pt also denies any obvious fluctuation in symptoms with weather or environmental change or other alleviating or aggravating factors.    Pt denies any increase in rescue therapy over baseline, denies waking up needing it or having early am exacerbations or coughing/wheezing/ or dyspnea      Objective:   Physical Exam  Gen. Pleasant, well-nourished, in no distress ENT - no lesions, no post nasal drip Neck: No JVD, no thyromegaly, no carotid bruits Lungs: no use of accessory muscles, no dullness to percussion, clear without rales or rhonchi  Cardiovascular: Rhythm regular,  heart sounds  normal, no murmurs or gallops, no peripheral edema Musculoskeletal: No deformities, no cyanosis or clubbing         Assessment & Plan:

## 2014-08-09 ENCOUNTER — Encounter (HOSPITAL_COMMUNITY): Payer: Self-pay | Admitting: Pharmacy Technician

## 2014-08-10 NOTE — Pre-Procedure Instructions (Signed)
Heather Banks  08/10/2014   Your procedure is scheduled on:  Mon, Jan 18 @ 9:30 AM  Report to [name]  at 7:30 AM.  Call this number if you have problems the morning of surgery: [phone]   Remember:   Do not eat food or drink liquids after midnight.   Take these medicines the morning of surgery with A SIP OF WATER: Albuterol<Bring Your Inhaler With You>,Pulmicort(Budesonide),Pantoprazole(Protonix),and Ranitidine(Zantac)               No Goody's,BC's,Aleve,Aspirin,Ibuprofen,Fish Oil,or any Herbal Medications   Do not wear jewelry, make-up or nail polish.  Do not wear lotions, powders, or perfumes. You may wear deodorant.  Do not shave 48 hours prior to surgery.   Do not bring valuables to the hospital.  [facility] is not responsible                  for any belongings or valuables.               Contacts, dentures or bridgework may not be worn into surgery.  Leave suitcase in the car. After surgery it may be brought to your room.  For patients admitted to the hospital, discharge time is determined by your                treatment team.               Patients discharged the day of surgery will not be allowed to drive  home.    Special Instructions:  [place] - Preparing for Surgery  Before surgery, you can play an important role.  Because skin is not sterile, your skin needs to be as free of germs as possible.  You can reduce the number of germs on you skin by washing with CHG (chlorahexidine gluconate) soap before surgery.  CHG is an antiseptic cleaner which kills germs and bonds with the skin to continue killing germs even after washing.  Please DO NOT use if you have an allergy to CHG or antibacterial soaps.  If your skin becomes reddened/irritated stop using the CHG and inform your nurse when you arrive at Short Stay.  Do not shave (including legs and underarms) for at least 48 hours prior to the first CHG shower.  You may shave your face.  Please follow these  instructions carefully:   1.  Shower with CHG Soap the night before surgery and the                                morning of Surgery.  2.  If you choose to wash your hair, wash your hair first as usual with your       normal shampoo.  3.  After you shampoo, rinse your hair and body thoroughly to remove the                      Shampoo.  4.  Use CHG as you would any other liquid soap.  You can apply chg directly       to the skin and wash gently with scrungie or a clean washcloth.  5.  Apply the CHG Soap to your body ONLY FROM THE NECK DOWN.        Do not use on open wounds or open sores.  Avoid contact with your eyes,       ears, mouth and  genitals (private parts).  Wash genitals (private parts)       with your normal soap.  6.  Wash thoroughly, paying special attention to the area where your surgery        will be performed.  7.  Thoroughly rinse your body with warm water from the neck down.  8.  DO NOT shower/wash with your normal soap after using and rinsing off       the CHG Soap.  9.  Pat yourself dry with a clean towel.            10.  Wear clean pajamas.            11.  Place clean sheets on your bed the night of your first shower and do not        sleep with pets.  Day of Surgery  Do not apply any lotions/deoderants the morning of surgery.  Please wear clean clothes to the hospital/surgery center.     Please read over the following fact sheets that you were given: Pain Booklet, Coughing and Deep Breathing and Surgical Site Infection Prevention

## 2014-08-11 ENCOUNTER — Telehealth: Payer: Self-pay | Admitting: Pulmonary Disease

## 2014-08-11 ENCOUNTER — Encounter (HOSPITAL_COMMUNITY)
Admission: RE | Admit: 2014-08-11 | Discharge: 2014-08-11 | Disposition: A | Discharge status: Home / Self Care | Payer: Medicaid - Out of State | Source: Ambulatory Visit | Attending: Pulmonary Disease | Admitting: Pulmonary Disease

## 2014-08-11 ENCOUNTER — Ambulatory Visit (HOSPITAL_COMMUNITY)
Admission: RE | Admit: 2014-08-11 | Discharge: 2014-08-11 | Disposition: A | Discharge status: Home / Self Care | Payer: Medicaid - Out of State | Source: Ambulatory Visit | Attending: Anesthesiology | Admitting: Anesthesiology

## 2014-08-11 ENCOUNTER — Encounter (HOSPITAL_COMMUNITY): Payer: Self-pay

## 2014-08-11 DIAGNOSIS — R918 Other nonspecific abnormal finding of lung field: Secondary | ICD-10-CM | POA: Insufficient documentation

## 2014-08-11 HISTORY — DX: Pneumonia, unspecified organism: J18.9

## 2014-08-11 HISTORY — DX: Gastro-esophageal reflux disease without esophagitis: K21.9

## 2014-08-11 HISTORY — DX: Personal history of other diseases of the nervous system and sense organs: Z86.69

## 2014-08-11 HISTORY — DX: Personal history of other diseases of the respiratory system: Z87.09

## 2014-08-11 HISTORY — DX: Other nonspecific abnormal finding of lung field: R91.8

## 2014-08-11 HISTORY — DX: Panic disorder (episodic paroxysmal anxiety): F41.0

## 2014-08-11 LAB — CBC
HCT: 40.4 % (ref 36.0–46.0)
Hemoglobin: 13.4 g/dL (ref 12.0–15.0)
MCH: 30.8 pg (ref 26.0–34.0)
MCHC: 33.2 g/dL (ref 30.0–36.0)
MCV: 92.9 fL (ref 78.0–100.0)
Platelets: 272 10*3/uL (ref 150–400)
RBC: 4.35 MIL/uL (ref 3.87–5.11)
RDW: 14.1 % (ref 11.5–15.5)
WBC: 9.1 10*3/uL (ref 4.0–10.5)

## 2014-08-11 LAB — BASIC METABOLIC PANEL
Anion gap: 12 (ref 5–15)
BUN: 8 mg/dL (ref 6–23)
CALCIUM: 9 mg/dL (ref 8.4–10.5)
CHLORIDE: 103 meq/L (ref 96–112)
CO2: 23 mmol/L (ref 19–32)
Creatinine, Ser: 0.57 mg/dL (ref 0.50–1.10)
GFR calc Af Amer: >90 mL/min (ref >90)
Glucose, Bld: 98 mg/dL (ref 70–99)
POTASSIUM: 3.8 mmol/L (ref 3.5–5.1)
SODIUM: 138 mmol/L (ref 135–145)

## 2014-08-11 NOTE — Telephone Encounter (Signed)
Orders were placed already

## 2014-08-11 NOTE — Telephone Encounter (Signed)
Needs order for Bronch... To [employer]

## 2014-08-11 NOTE — Progress Notes (Addendum)
Pt doesn't have a cardiologist  Denies ever having an echo/stress test/heart cath  EKG in epic from 06-29-14  Pt doesn't have a Medical Md

## 2014-08-11 NOTE — Progress Notes (Signed)
Spoke with Claiborne Billings at [facility] office requesting orders.

## 2014-08-14 ENCOUNTER — Ambulatory Visit (HOSPITAL_COMMUNITY): Payer: Medicaid - Out of State | Admitting: Anesthesiology

## 2014-08-14 ENCOUNTER — Ambulatory Visit (HOSPITAL_COMMUNITY)
Admission: RE | Admit: 2014-08-14 | Discharge: 2014-08-14 | Disposition: A | Discharge status: Home / Self Care | Payer: Medicaid - Out of State | Source: Ambulatory Visit | Attending: Pulmonary Disease | Admitting: Pulmonary Disease

## 2014-08-14 ENCOUNTER — Ambulatory Visit (HOSPITAL_COMMUNITY): Payer: Medicaid - Out of State

## 2014-08-14 ENCOUNTER — Other Ambulatory Visit: Payer: Self-pay | Admitting: Pulmonary Disease

## 2014-08-14 ENCOUNTER — Encounter (HOSPITAL_COMMUNITY): Payer: Self-pay | Admitting: *Deleted

## 2014-08-14 ENCOUNTER — Encounter (HOSPITAL_COMMUNITY)
Admission: RE | Disposition: A | Discharge status: Home / Self Care | Payer: Self-pay | Source: Ambulatory Visit | Attending: Pulmonary Disease

## 2014-08-14 DIAGNOSIS — R918 Other nonspecific abnormal finding of lung field: Secondary | ICD-10-CM | POA: Diagnosis present

## 2014-08-14 DIAGNOSIS — F17210 Nicotine dependence, cigarettes, uncomplicated: Secondary | ICD-10-CM | POA: Insufficient documentation

## 2014-08-14 DIAGNOSIS — Z9981 Dependence on supplemental oxygen: Secondary | ICD-10-CM | POA: Insufficient documentation

## 2014-08-14 DIAGNOSIS — Z419 Encounter for procedure for purposes other than remedying health state, unspecified: Secondary | ICD-10-CM

## 2014-08-14 DIAGNOSIS — J189 Pneumonia, unspecified organism: Secondary | ICD-10-CM

## 2014-08-14 DIAGNOSIS — Z9889 Other specified postprocedural states: Secondary | ICD-10-CM

## 2014-08-14 HISTORY — PX: VIDEO BRONCHOSCOPY WITH ENDOBRONCHIAL NAVIGATION: SHX6175

## 2014-08-14 SURGERY — VIDEO BRONCHOSCOPY WITH ENDOBRONCHIAL NAVIGATION
Anesthesia: General

## 2014-08-14 MED ORDER — ONDANSETRON HCL 4 MG/2ML IJ SOLN
INTRAMUSCULAR | Status: AC
  Filled 2014-08-14: qty 2

## 2014-08-14 MED ORDER — FENTANYL CITRATE 0.05 MG/ML IJ SOLN: 25.0000 ug | INTRAMUSCULAR | Status: DC | PRN

## 2014-08-14 MED ORDER — SODIUM CHLORIDE 0.9 % IV SOLN
10.0000 mg | INTRAVENOUS | Status: DC | PRN
  Administered 2014-08-14: 20 ug/min via INTRAVENOUS

## 2014-08-14 MED ORDER — MIDAZOLAM HCL 2 MG/2ML IJ SOLN
INTRAMUSCULAR | Status: AC
  Filled 2014-08-14: qty 2

## 2014-08-14 MED ORDER — ROCURONIUM BROMIDE 100 MG/10ML IV SOLN
INTRAVENOUS | Status: DC | PRN
  Administered 2014-08-14: 30 mg via INTRAVENOUS
  Administered 2014-08-14: 10 mg via INTRAVENOUS

## 2014-08-14 MED ORDER — ONDANSETRON HCL 4 MG/2ML IJ SOLN
INTRAMUSCULAR | Status: DC | PRN
  Administered 2014-08-14: 4 mg via INTRAVENOUS

## 2014-08-14 MED ORDER — LIDOCAINE HCL (CARDIAC) 20 MG/ML IV SOLN
INTRAVENOUS | Status: DC | PRN
  Administered 2014-08-14: 80 mg via INTRAVENOUS

## 2014-08-14 MED ORDER — FENTANYL CITRATE 0.05 MG/ML IJ SOLN
INTRAMUSCULAR | Status: DC | PRN
  Administered 2014-08-14 (×2): 75 ug via INTRAVENOUS
  Administered 2014-08-14 (×2): 25 ug via INTRAVENOUS
  Administered 2014-08-14: 50 ug via INTRAVENOUS

## 2014-08-14 MED ORDER — LIDOCAINE HCL (CARDIAC) 20 MG/ML IV SOLN
INTRAVENOUS | Status: AC
  Filled 2014-08-14: qty 5

## 2014-08-14 MED ORDER — EPINEPHRINE HCL 1 MG/ML IJ SOLN
INTRAMUSCULAR | Status: AC
  Filled 2014-08-14: qty 1

## 2014-08-14 MED ORDER — ROCURONIUM BROMIDE 50 MG/5ML IV SOLN
INTRAVENOUS | Status: AC
  Filled 2014-08-14: qty 1

## 2014-08-14 MED ORDER — PROPOFOL 10 MG/ML IV BOLUS
INTRAVENOUS | Status: DC | PRN
Start: 2014-08-14 — End: 2014-08-14
  Administered 2014-08-14: 150 mg via INTRAVENOUS

## 2014-08-14 MED ORDER — PROPOFOL 10 MG/ML IV BOLUS
INTRAVENOUS | Status: AC
  Filled 2014-08-14: qty 20

## 2014-08-14 MED ORDER — MIDAZOLAM HCL 5 MG/5ML IJ SOLN
INTRAMUSCULAR | Status: DC | PRN
  Administered 2014-08-14: 2 mg via INTRAVENOUS

## 2014-08-14 MED ORDER — PROMETHAZINE HCL 25 MG/ML IJ SOLN: 6.2500 mg | INTRAMUSCULAR | Status: DC | PRN

## 2014-08-14 MED ORDER — FENTANYL CITRATE 0.05 MG/ML IJ SOLN
INTRAMUSCULAR | Status: AC
  Filled 2014-08-14: qty 5

## 2014-08-14 MED ORDER — NEOSTIGMINE METHYLSULFATE 10 MG/10ML IV SOLN
INTRAVENOUS | Status: DC | PRN
Start: 2014-08-14 — End: 2014-08-14
  Administered 2014-08-14: 3 mg via INTRAVENOUS

## 2014-08-14 MED ORDER — GLYCOPYRROLATE 0.2 MG/ML IJ SOLN
INTRAMUSCULAR | Status: DC | PRN
  Administered 2014-08-14: 0.4 mg via INTRAVENOUS
  Administered 2014-08-14: 0.1 mg via INTRAVENOUS
  Administered 2014-08-14: 0.2 mg via INTRAVENOUS

## 2014-08-14 MED ORDER — LACTATED RINGERS IV SOLN
INTRAVENOUS | Status: DC
  Administered 2014-08-14 (×2): via INTRAVENOUS

## 2014-08-14 MED ORDER — 0.9 % SODIUM CHLORIDE (POUR BTL) OPTIME
TOPICAL | Status: DC | PRN
  Administered 2014-08-14: 1000 mL

## 2014-08-14 SURGICAL SUPPLY — 32 items
BRUSH SUPERTRAX BIOPSY (INSTRUMENTS) IMPLANT
BRUSH SUPERTRAX NDL-TIP CYTO (INSTRUMENTS) ×9 IMPLANT
CANISTER SUCTION 2500CC (MISCELLANEOUS) ×3 IMPLANT
CHANNEL WORK EXTEND EDGE 180 (KITS) IMPLANT
CHANNEL WORK EXTEND EDGE 45 (KITS) IMPLANT
CHANNEL WORK EXTEND EDGE 90 (KITS) IMPLANT
CONT SPEC 4OZ CLIKSEAL STRL BL (MISCELLANEOUS) ×12 IMPLANT
COVER TABLE BACK 60X90 (DRAPES) ×3 IMPLANT
DRAPE C-ARM 42X72 X-RAY (DRAPES) IMPLANT
FILTER STRAW FLUID ASPIR (MISCELLANEOUS) IMPLANT
FORCEPS BIOP SUPERTRX PREMAR (INSTRUMENTS) IMPLANT
GAUZE SPONGE 4X4 12PLY STRL (GAUZE/BANDAGES/DRESSINGS) ×3 IMPLANT
GLOVE SURG SIGNA 7.5 PF LTX (GLOVE) ×3 IMPLANT
KIT CLEAN ENDO COMPLIANCE (KITS) ×3 IMPLANT
KIT LOCATABLE GUIDE (CANNULA) IMPLANT
KIT MARKER FIDUCIAL DELIVERY (KITS) IMPLANT
KIT PROCEDURE EDGE 180 (KITS) ×3 IMPLANT
KIT PROCEDURE EDGE 45 (KITS) IMPLANT
KIT PROCEDURE EDGE 90 (KITS) IMPLANT
KIT ROOM TURNOVER OR (KITS) ×3 IMPLANT
MARKER SKIN DUAL TIP RULER LAB (MISCELLANEOUS) ×3 IMPLANT
NEEDLE SUPERTRX PREMARK BIOPSY (NEEDLE) ×9 IMPLANT
NS IRRIG 1000ML POUR BTL (IV SOLUTION) ×3 IMPLANT
OIL SILICONE PENTAX (PARTS (SERVICE/REPAIRS)) ×3 IMPLANT
PAD ARMBOARD 7.5X6 YLW CONV (MISCELLANEOUS) ×6 IMPLANT
PATCHES PATIENT (LABEL) ×9 IMPLANT
SYR 20ML ECCENTRIC (SYRINGE) ×6 IMPLANT
SYR 30ML LL (SYRINGE) ×3 IMPLANT
TOWEL OR 17X24 6PK STRL BLUE (TOWEL DISPOSABLE) ×3 IMPLANT
TRAP SPECIMEN MUCOUS 40CC (MISCELLANEOUS) ×3 IMPLANT
TUBE CONNECTING 20'X1/4 (TUBING) ×1
TUBE CONNECTING 20X1/4 (TUBING) ×2 IMPLANT

## 2014-08-14 NOTE — Discharge Instructions (Signed)
What to eat:  For your first meals, you should eat lightly; only small meals initially.  If you do not have nausea, you may eat larger meals.  Avoid spicy, greasy and heavy food.    You have small pneumothorax (lung puncture) - Please come to the office tomorrow Tuesday 1/19 for CHEST XRAY    General Anesthesia, Adult, Care After  Refer to this sheet in the next few weeks. These instructions provide you with information on caring for yourself after your procedure. Your health care provider may also give you more specific instructions. Your treatment has been planned according to current medical practices, but problems sometimes occur. Call your health care provider if you have any problems or questions after your procedure.  WHAT TO EXPECT AFTER THE PROCEDURE  After the procedure, it is typical to experience:  Sleepiness.  Nausea and vomiting. HOME CARE INSTRUCTIONS  For the first 24 hours after general anesthesia:  Have a responsible person with you.  Do not drive a car. If you are alone, do not take public transportation.  Do not drink alcohol.  Do not take medicine that has not been prescribed by your health care provider.  Do not sign important papers or make important decisions.  You may resume a normal diet and activities as directed by your health care provider.  Change bandages (dressings) as directed.  If you have questions or problems that seem related to general anesthesia, call the hospital and ask for the anesthetist or anesthesiologist on call. SEEK MEDICAL CARE IF:  You have nausea and vomiting that continue the day after anesthesia.  You develop a rash. SEEK IMMEDIATE MEDICAL CARE IF:  You have difficulty breathing.  You have chest pain.  You have any allergic problems. Document Released: 10/20/2000 Document Revised: 03/16/2013 Document Reviewed: 01/27/2013  [facility] Patient Information 2014 [place], [place].

## 2014-08-14 NOTE — Anesthesia Postprocedure Evaluation (Signed)
  Anesthesia Post-op Note  Patient: Heather Banks  Procedure(s) Performed: Procedure(s): VIDEO BRONCHOSCOPY WITH ENDOBRONCHIAL NAVIGATION (N/A)  Patient Location: PACU  Anesthesia Type:General  Level of Consciousness: awake and alert   Airway and Oxygen Therapy: Patient Spontanous Breathing  Post-op Pain: mild  Post-op Assessment: Post-op Vital signs reviewed and Patient's Cardiovascular Status Stable  Post-op Vital Signs: stable  Last Vitals:  Filed Vitals:   08/14/14 1328  BP: 104/74  Pulse: 85  Temp:   Resp: 9    Complications: No apparent anesthesia complications

## 2014-08-14 NOTE — Transfer of Care (Signed)
Immediate Anesthesia Transfer of Care Note  Patient: Heather Banks  Procedure(s) Performed: Procedure(s): VIDEO BRONCHOSCOPY WITH ENDOBRONCHIAL NAVIGATION (N/A)  Patient Location: PACU  Anesthesia Type:General  Level of Consciousness: awake, alert  and oriented  Airway & Oxygen Therapy: Patient Spontanous Breathing and Patient connected to face mask oxygen  Post-op Assessment: Report given to PACU RN, Post -op Vital signs reviewed and stable and Patient moving all extremities X 4  Post vital signs: Reviewed and stable  Complications: No apparent anesthesia complications

## 2014-08-14 NOTE — Anesthesia Procedure Notes (Signed)
Procedure Name: Intubation Date/Time: 08/14/2014 10:40 AM Performed by: Kyung Rudd Pre-anesthesia Checklist: Patient identified, Emergency Drugs available, Suction available, Patient being monitored and Timeout performed Patient Re-evaluated:Patient Re-evaluated prior to inductionOxygen Delivery Method: Circle system utilized Preoxygenation: Pre-oxygenation with 100% oxygen Intubation Type: IV induction Ventilation: Mask ventilation without difficulty Laryngoscope Size: Mac and 4 Grade View: Grade I Tube type: Oral Tube size: 8.5 mm Number of attempts: 1 Airway Equipment and Method: Stylet Placement Confirmation: positive ETCO2,  ETT inserted through vocal cords under direct vision and breath sounds checked- equal and bilateral Tube secured with: Tape Dental Injury: Teeth and Oropharynx as per pre-operative assessment

## 2014-08-14 NOTE — H&P (View-Only) (Signed)
   Subjective:    Patient ID: Heather Banks, female    DOB: [birth year], 64 y.o.   MRN: [id number]  HPI  64 yo smoker seen for pulmonary consult during hospitalization for COPD exacerbation . Found to have a lung mass.   Admitted 06/1314 for COPD exacerbation.  CT showed bilateral upper lobe lung mass w/ minmally enlarged hilar and mediastinal nodes.  She was treated with IV antibiotics, steroids, and nebulized bronchodilators. She was discharged on a prednisone taper and Levaquin. She was discharged on oxygen at 3 L. PFT showed an FEV1 at 46%, ratio of 59. Diffusing capacity was decreased at 56% , FVC was 60%  She is maintained on Pulmicort nebulizer twice daily No formal dx of COPD before hospital admissioin.  Does not have any insurance . Paid $800 for meds.  Is looking into disability and medicaid in [place].   She is feeling much improved, less dyspnea, cough .  Reports breathing is doing well since discharge.  PET scan reviewed- hypermetabolic lung masses without mediastinal lymph nodes,No other areas lighted up  Reports smoking is decreased to 1-2cigs per days Gets winded with activity , wears out easily  Uses oxygen at night  Review of Systems Pt denies any significant  nasal congestion or excess secretions, fever, chills, sweats, unintended wt loss, pleuritic or exertional cp, orthopnea pnd or leg swelling.  Pt also denies any obvious fluctuation in symptoms with weather or environmental change or other alleviating or aggravating factors.    Pt denies any increase in rescue therapy over baseline, denies waking up needing it or having early am exacerbations or coughing/wheezing/ or dyspnea      Objective:   Physical Exam  Gen. Pleasant, well-nourished, in no distress ENT - no lesions, no post nasal drip Neck: No JVD, no thyromegaly, no carotid bruits Lungs: no use of accessory muscles, no dullness to percussion, clear without rales or rhonchi  Cardiovascular: Rhythm regular,  heart sounds  normal, no murmurs or gallops, no peripheral edema Musculoskeletal: No deformities, no cyanosis or clubbing         Assessment & Plan:

## 2014-08-14 NOTE — Interval H&P Note (Signed)
Heather Banks has presented today for procedure, with the diagnosis of BL lung masses. The various methods of treatment have been discussed with the patient and family. After consideration of risks, benefits and other options for treatment, the patient has consented to Procedure(s) :  Bronchoscopy with navigation under general anesthesia with biopsies .  The patient's history has been reviewed, patient examined, no change in status, stable for surgery. I have reviewed the patient's chart and labs. Questions were answered to the patient's satisfaction

## 2014-08-14 NOTE — Op Note (Signed)
Video Bronchoscopy with Electromagnetic Navigation Procedure Note  Date of Operation: 08/14/2014  Pre-op Diagnosis: Bilateral lung masses  Post-op Diagnosis: Same  Surgeon: Heather Banks   Assistants: none  Anesthesia: General endotracheal anesthesia  Operation: Flexible video fiberoptic bronchoscopy with electromagnetic navigation and biopsies.  Estimated Blood Loss: Minimal  Complications: none  Indications and History: Heather Banks is a 64 y.o. female smoker with bilateral lung masses.  The risks, benefits, complications, treatment options and expected outcomes were discussed with the patient.  The possibilities of pneumothorax, pneumonia, reaction to medication, pulmonary aspiration, perforation of a viscus, bleeding, failure to diagnose a condition and creating a complication requiring transfusion or operation were discussed with the patient who freely signed the consent.    Description of Procedure: The patient was seen in the Preoperative Area, was examined and was deemed appropriate to proceed.  The patient was taken to OR #10, identified as Heather Banks and the procedure verified as Flexible Video Fiberoptic Bronchoscopy.  A Time Out was held and the above information confirmed.   Prior to the date of the procedure a high-resolution CT scan of the chest was performed. Utilizing [place] a virtual tracheobronchial tree was generated to allow the creation of distinct navigation pathways to the patient's lung masses. After being taken to the operating room general anesthesia was initiated and the patient  was orally intubated. The video fiberoptic bronchoscope was introduced via the endotracheal tube and a general inspection was performed which showed minimal secretions, no endobronchial lesions. The extendable working channel and locator guide were introduced into the bronchoscope. The distinct navigation pathways prepared prior to this procedure were then utilized to  navigate to within 1 cm of patient's lesions identified on CT scan. The extendable working channel was secured into place and the locator guide was withdrawn. Under fluoroscopic guidance transbronchial needle brushings, transbronchial Wang needle biopsies, and transbronchial forceps biopsies were performed to be sent for cytology and pathology. A bronchioalveolar lavage was performed  and sent for cytology and microbiology ( fungal, AFB smears and cultures).  The above procedures were repeated for targets T1 (LUL) T2 (RUL ) & T3 (RUL) At the end of the procedure a general airway inspection was performed and there was no evidence of active bleeding. The bronchoscope was removed.  The patient tolerated the procedure well. There was no significant blood loss and there were no obvious complications. A post-procedural chest x-ray is pending.  Samples: 1. Transbronchial needle brushings from targets T1 (LUL) T2 (RUL solid ) & T3 (RUL GGO) 2. Transbronchial Wang needle biopsies from same areas 3. Transbronchial forceps biopsies from same areas 4. Bronchoalveolar lavage from same areas   Plans:  The patient will be discharged from the PACU to home when recovered from anesthesia and after chest x-ray is reviewed. We will review the cytology, pathology and microbiology results with the patient when they become available. Outpatient followup will be with me.   Heather Mead MD. Shade Flood. [place] Pager [phone] If no response call [phone]    08/14/2014

## 2014-08-14 NOTE — Anesthesia Preprocedure Evaluation (Addendum)
Anesthesia Evaluation  Patient identified by MRN, date of birth, ID band Patient awake    Reviewed: Allergy & Precautions, NPO status , Patient's Chart, lab work & pertinent test results  Airway Mallampati: II  TM Distance: >3 FB Neck ROM: Full    Dental  (+) Poor Dentition, Dental Advisory Given, Edentulous Upper, Partial Lower   Pulmonary COPDCurrent Smoker,  breath sounds clear to auscultation        Cardiovascular negative cardio ROS  Rhythm:Regular Rate:Normal     Neuro/Psych    GI/Hepatic Neg liver ROS, GERD-  ,  Endo/Other  negative endocrine ROS  Renal/GU negative Renal ROS     Musculoskeletal   Abdominal   Peds  Hematology negative hematology ROS (+)   Anesthesia Other Findings   Reproductive/Obstetrics                           Anesthesia Physical Anesthesia Plan  ASA: III  Anesthesia Plan: General   Post-op Pain Management:    Induction: Intravenous  Airway Management Planned: Oral ETT  Additional Equipment:   Intra-op Plan:   Post-operative Plan: Extubation in OR  Informed Consent: I have reviewed the patients History and Physical, chart, labs and discussed the procedure including the risks, benefits and alternatives for the proposed anesthesia with the patient or authorized representative who has indicated his/her understanding and acceptance.   Dental advisory given  Plan Discussed with: CRNA and Surgeon  Anesthesia Plan Comments:         Anesthesia Quick Evaluation

## 2014-08-15 ENCOUNTER — Telehealth: Payer: Self-pay | Admitting: Pulmonary Disease

## 2014-08-15 ENCOUNTER — Ambulatory Visit (INDEPENDENT_AMBULATORY_CARE_PROVIDER_SITE_OTHER)
Admission: RE | Admit: 2014-08-15 | Discharge: 2014-08-15 | Disposition: A | Discharge status: Home / Self Care | Payer: Self-pay | Source: Ambulatory Visit | Attending: Pulmonary Disease | Admitting: Pulmonary Disease

## 2014-08-15 ENCOUNTER — Other Ambulatory Visit: Payer: Self-pay | Admitting: *Deleted

## 2014-08-15 ENCOUNTER — Other Ambulatory Visit: Payer: Self-pay | Admitting: Pulmonary Disease

## 2014-08-15 DIAGNOSIS — J189 Pneumonia, unspecified organism: Secondary | ICD-10-CM

## 2014-08-15 DIAGNOSIS — R918 Other nonspecific abnormal finding of lung field: Secondary | ICD-10-CM

## 2014-08-15 MED ORDER — HYDROCODONE-ACETAMINOPHEN 5-325 MG PO TABS: 1.0000 | ORAL_TABLET | Freq: Four times a day (QID) | ORAL | Status: DC | PRN

## 2014-08-15 MED ORDER — NICOTINE 21 MG/24HR TD PT24: 21.0000 mg | MEDICATED_PATCH | Freq: Every day | TRANSDERMAL | Status: DC

## 2014-08-15 NOTE — Telephone Encounter (Signed)
OK to refill vicodin #30 & nicoderm 21 Please let her know that CXR shows persistent pneumothorax - she is to call us/ go to ER if chest pain/ breathing gets worse Repeat CXR on Thursday 10 am

## 2014-08-15 NOTE — Telephone Encounter (Signed)
Ok per Dr. Elsworth Soho... Given to Dr. Annamaria Boots for signature. Called patient and advised pt that RX is ready for pick up.

## 2014-08-15 NOTE — Telephone Encounter (Signed)
Pl ensure pt gets FU CXR on Thursday 9 am I have advised her to stay on oxygen continuously until then and go to ER, if worsening dyspnea or right-sided chest pain

## 2014-08-15 NOTE — Telephone Encounter (Signed)
Pt getting CXR and is aware to call us when she gets home. Will need to get approval from RA to script vicodin and nicoderm CQ as RA has not scripted these meds before. Pt received both meds while in hospital.

## 2014-08-15 NOTE — Telephone Encounter (Signed)
CXR - Enlargement of Right Pneumothorax- 15%. Xray results in EPIC.

## 2014-08-15 NOTE — Telephone Encounter (Signed)
Refilled Vicodin and Nicoderm.  Dr. Annamaria Boots signed rx.  Nothing further needed.

## 2014-08-15 NOTE — Telephone Encounter (Signed)
Pt called back. Pt is requesting a refill of Vicodin for her headaches. Informed pt she will need to contact her PCP for that refill, pt stated she does not have a PCP and is requesting I ask RA for refill. I suggested pt find a PCP for refills just as this. Pt verbalized understanding. Pt also requesting a refill of Nicoderm CQ 21mg . Both scripts were written while in hospital.   Dr. Elsworth Soho please advise if you are ok filling these meds. Thanks.

## 2014-08-16 ENCOUNTER — Encounter (HOSPITAL_COMMUNITY): Payer: Self-pay | Admitting: Pulmonary Disease

## 2014-08-16 NOTE — Telephone Encounter (Signed)
Unable to leave msg on vm.  Will call back. CXR order entered.

## 2014-08-17 ENCOUNTER — Ambulatory Visit (INDEPENDENT_AMBULATORY_CARE_PROVIDER_SITE_OTHER)
Admission: RE | Admit: 2014-08-17 | Discharge: 2014-08-17 | Disposition: A | Discharge status: Home / Self Care | Payer: Self-pay | Source: Ambulatory Visit | Attending: Pulmonary Disease | Admitting: Pulmonary Disease

## 2014-08-17 DIAGNOSIS — R918 Other nonspecific abnormal finding of lung field: Secondary | ICD-10-CM

## 2014-08-17 NOTE — Telephone Encounter (Signed)
Patient notified.  No questions or concerns at this time. Nothing further needed.   

## 2014-08-21 ENCOUNTER — Ambulatory Visit (INDEPENDENT_AMBULATORY_CARE_PROVIDER_SITE_OTHER)
Admission: RE | Admit: 2014-08-21 | Discharge: 2014-08-21 | Disposition: A | Discharge status: Home / Self Care | Payer: Self-pay | Source: Ambulatory Visit | Attending: Pulmonary Disease | Admitting: Pulmonary Disease

## 2014-08-21 ENCOUNTER — Ambulatory Visit (INDEPENDENT_AMBULATORY_CARE_PROVIDER_SITE_OTHER): Payer: Self-pay | Admitting: Pulmonary Disease

## 2014-08-21 ENCOUNTER — Encounter: Payer: Self-pay | Admitting: Pulmonary Disease

## 2014-08-21 ENCOUNTER — Telehealth: Payer: Self-pay | Admitting: Internal Medicine

## 2014-08-21 VITALS — BP 142/80 | HR 97 | Ht 65.0 in | Wt 168.6 lb

## 2014-08-21 DIAGNOSIS — R918 Other nonspecific abnormal finding of lung field: Secondary | ICD-10-CM

## 2014-08-21 DIAGNOSIS — J441 Chronic obstructive pulmonary disease with (acute) exacerbation: Secondary | ICD-10-CM

## 2014-08-21 DIAGNOSIS — F411 Generalized anxiety disorder: Secondary | ICD-10-CM

## 2014-08-21 DIAGNOSIS — J95811 Postprocedural pneumothorax: Secondary | ICD-10-CM | POA: Insufficient documentation

## 2014-08-21 DIAGNOSIS — C3490 Malignant neoplasm of unspecified part of unspecified bronchus or lung: Secondary | ICD-10-CM

## 2014-08-21 MED ORDER — ALPRAZOLAM 0.5 MG PO TABS: 0.5000 mg | ORAL_TABLET | Freq: Every evening | ORAL | Status: DC | PRN

## 2014-08-21 NOTE — Progress Notes (Signed)
   Subjective:    Patient ID: Heather Banks, female    DOB: Jun 17, 1951, 64 y.o.   MRN: [id number]  HPI  64 yo smoker seen for pulmonary consult during hospitalization for COPD exacerbation . Found to have a lung mass.   Admitted 06/1314 for COPD exacerbation.  CT showed bilateral upper lobe lung mass w/ minmally enlarged hilar and mediastinal nodes, hypermetabolic on PET She was discharged on oxygen at 3 L. PFT showed an FEV1 at 46%, ratio of 59. Diffusing capacity was decreased at 56% , FVC was 60%  08/21/2014  Chief Complaint  Patient presents with  . Follow-up    Results from CXR, biopsy results   Underwent ENB guided biopsy , T1 (LUL ) neg, T2 & T3 (RUL) showed adenoCA.- complicated by 03% rt pnthx, now decreased to <5% Reports smoking is decreased to 1-2cigs per days Gets winded with activity , wears out easily  Uses oxygen at night Very anxious when I gave her results, accompanied by daughter    Review of Systems neg for any significant sore throat, dysphagia, itching, sneezing, nasal congestion or excess/ purulent secretions, fever, chills, sweats, unintended wt loss, pleuritic or exertional cp, hempoptysis, orthopnea pnd or change in chronic leg swelling. Also denies presyncope, palpitations, heartburn, abdominal pain, nausea, vomiting, diarrhea or change in bowel or urinary habits, dysuria,hematuria, rash, arthralgias, visual complaints, headache, numbness weakness or ataxia.     Objective:   Physical Exam  Gen. Pleasant, well-nourished, in no distress ENT - no lesions, no post nasal drip Neck: No JVD, no thyromegaly, no carotid bruits Lungs: no use of accessory muscles, no dullness to percussion, decreased without rales or rhonchi  Cardiovascular: Rhythm regular, heart sounds  normal, no murmurs or gallops, no peripheral edema Musculoskeletal: No deformities, no cyanosis or clubbing        Assessment & Plan:

## 2014-08-21 NOTE — Assessment & Plan Note (Signed)
Ct albuterol nebs + budesonide Add spiriva on next visit

## 2014-08-21 NOTE — Telephone Encounter (Signed)
S/W PATIENT AND GAVE NP APPT FOR 01/27 @ 11:15 W/DR. MOHAMED.

## 2014-08-21 NOTE — Assessment & Plan Note (Signed)
-  almost resolved on right If rpt biopsy required for tumor markers, can do CT guided on left UL once pnthx fully resolved

## 2014-08-21 NOTE — Patient Instructions (Signed)
We discussed diagnosis of lung cancer I will refer you to dr Earlie Server - oncologist

## 2014-08-21 NOTE — Assessment & Plan Note (Signed)
We discussed diagnosis of lung cancer -prob stg 4  I will refer you to dr Earlie Server - oncologist We discussed treatment options

## 2014-08-21 NOTE — Assessment & Plan Note (Signed)
Xanax 0.5 bid prn #60 given She was accepting of the diagnosis

## 2014-08-22 ENCOUNTER — Other Ambulatory Visit: Payer: Self-pay | Admitting: Medical Oncology

## 2014-08-22 DIAGNOSIS — C3490 Malignant neoplasm of unspecified part of unspecified bronchus or lung: Secondary | ICD-10-CM

## 2014-08-23 ENCOUNTER — Encounter: Payer: Self-pay | Admitting: Internal Medicine

## 2014-08-23 ENCOUNTER — Telehealth: Payer: Self-pay | Admitting: Internal Medicine

## 2014-08-23 ENCOUNTER — Telehealth: Payer: Self-pay | Admitting: *Deleted

## 2014-08-23 ENCOUNTER — Other Ambulatory Visit: Payer: Self-pay | Admitting: Medical Oncology

## 2014-08-23 ENCOUNTER — Ambulatory Visit: Payer: Self-pay

## 2014-08-23 ENCOUNTER — Other Ambulatory Visit (HOSPITAL_BASED_OUTPATIENT_CLINIC_OR_DEPARTMENT_OTHER): Payer: Self-pay

## 2014-08-23 ENCOUNTER — Ambulatory Visit (HOSPITAL_BASED_OUTPATIENT_CLINIC_OR_DEPARTMENT_OTHER): Payer: Self-pay | Admitting: Internal Medicine

## 2014-08-23 ENCOUNTER — Ambulatory Visit (HOSPITAL_BASED_OUTPATIENT_CLINIC_OR_DEPARTMENT_OTHER): Payer: Self-pay

## 2014-08-23 ENCOUNTER — Encounter: Payer: Self-pay | Admitting: *Deleted

## 2014-08-23 VITALS — BP 154/80 | HR 75 | Temp 98.5°F | Resp 18 | Ht 65.0 in | Wt 163.1 lb

## 2014-08-23 DIAGNOSIS — I878 Other specified disorders of veins: Secondary | ICD-10-CM

## 2014-08-23 DIAGNOSIS — C3411 Malignant neoplasm of upper lobe, right bronchus or lung: Secondary | ICD-10-CM

## 2014-08-23 DIAGNOSIS — C3412 Malignant neoplasm of upper lobe, left bronchus or lung: Secondary | ICD-10-CM

## 2014-08-23 DIAGNOSIS — C3490 Malignant neoplasm of unspecified part of unspecified bronchus or lung: Secondary | ICD-10-CM

## 2014-08-23 DIAGNOSIS — J441 Chronic obstructive pulmonary disease with (acute) exacerbation: Secondary | ICD-10-CM

## 2014-08-23 LAB — COMPREHENSIVE METABOLIC PANEL (CC13)
ALBUMIN: 4 g/dL (ref 3.5–5.0)
ALT: 11 U/L (ref 0–55)
ANION GAP: 14 meq/L — AB (ref 3–11)
AST: 13 U/L (ref 5–34)
Alkaline Phosphatase: 138 U/L (ref 40–150)
BILIRUBIN TOTAL: 0.41 mg/dL (ref 0.20–1.20)
BUN: 8.3 mg/dL (ref 7.0–26.0)
CHLORIDE: 102 meq/L (ref 98–109)
CO2: 23 mEq/L (ref 22–29)
Calcium: 9.6 mg/dL (ref 8.4–10.4)
Creatinine: 0.7 mg/dL (ref 0.6–1.1)
EGFR: >90 mL/min/{1.73_m2} (ref >90)
GLUCOSE: 99 mg/dL (ref 70–140)
Potassium: 4.2 mEq/L (ref 3.5–5.1)
Sodium: 139 mEq/L (ref 136–145)
Total Protein: 7.6 g/dL (ref 6.4–8.3)

## 2014-08-23 LAB — CBC WITH DIFFERENTIAL/PLATELET
BASO%: 0.5 % (ref 0.0–2.0)
Basophils Absolute: 0 10*3/uL (ref 0.0–0.1)
EOS ABS: 0.2 10*3/uL (ref 0.0–0.5)
EOS%: 2.3 % (ref 0.0–7.0)
HEMATOCRIT: 42.6 % (ref 34.8–46.6)
HEMOGLOBIN: 14 g/dL (ref 11.6–15.9)
LYMPH%: 26.2 % (ref 14.0–49.7)
MCH: 30.6 pg (ref 25.1–34.0)
MCHC: 32.9 g/dL (ref 31.5–36.0)
MCV: 93 fL (ref 79.5–101.0)
MONO#: 0.6 10*3/uL (ref 0.1–0.9)
MONO%: 7.7 % (ref 0.0–14.0)
NEUT#: 5.2 10*3/uL (ref 1.5–6.5)
NEUT%: 63.3 % (ref 38.4–76.8)
Platelets: 259 10*3/uL (ref 145–400)
RBC: 4.58 10*6/uL (ref 3.70–5.45)
RDW: 14 % (ref 11.2–14.5)
WBC: 8.2 10*3/uL (ref 3.9–10.3)
lymph#: 2.2 10*3/uL (ref 0.9–3.3)

## 2014-08-23 MED ORDER — DEXAMETHASONE 4 MG PO TABS: ORAL_TABLET | ORAL | Status: DC

## 2014-08-23 MED ORDER — FOLIC ACID 1 MG PO TABS: 1.0000 mg | ORAL_TABLET | Freq: Every day | ORAL | Status: DC

## 2014-08-23 MED ORDER — CYANOCOBALAMIN 1000 MCG/ML IJ SOLN
1000.0000 ug | Freq: Once | INTRAMUSCULAR | Status: AC
  Administered 2014-08-23: 1000 ug via INTRAMUSCULAR

## 2014-08-23 MED ORDER — PROCHLORPERAZINE MALEATE 10 MG PO TABS: 10.0000 mg | ORAL_TABLET | Freq: Four times a day (QID) | ORAL | Status: DC | PRN

## 2014-08-23 NOTE — Telephone Encounter (Signed)
Gave avs & calendar for February. Verifying appointment with [facility].

## 2014-08-23 NOTE — Telephone Encounter (Signed)
Per staff message and POF I have scheduled appts. Advised scheduler of appts. JMW  

## 2014-08-23 NOTE — Progress Notes (Signed)
[place] [phone]   [phone]  CONSULT NOTE  REFERRING PHYSICIAN: Dr. Kara Mead  REASON FOR CONSULTATION:  64 years old white female recently diagnosed with lung cancer.  HPI Heather Banks is a 64 y.o. female with past medical history significant for COPD, GERD, migraine headache as well as long history of smoking. The patient has been complaining of shortness of breath and cough for several weeks. She lives in [place] and was visiting her mother in [place]. Her symptoms were getting worse and her daughter brought her to the emergency department at the [facility]. During her evaluation chest x-ray was performed on 07/09/2014 and it showed focal mass like opacity in the left upper lobe near the apex questionable for pneumonia but neoplastic process was also in the differential diagnosis. CT scan of the chest with contrast was performed on the same day and it showed a spiculated mass in the left upper lobe contiguous with thickened pleura and measuring 4.3 cm suspicious for primary bronchogenic neoplasm. There was a second similar 2.9 cm nodule identified in the right upper lobe suspicious for a second primary. There was a small bilateral hilar and mediastinal lymph nodes. MRI of the brain on 07/10/2014 showed no evidence for metastatic disease to the brain. The patient also had a PET scan performed on 07/31/2014 and it showed the spiculated 3.7 x 3.5 cm left apical lung mass with maximum SUV of 22.6 with no definite chest wall invasion and no adjacent rib destruction. There was also marginally spiculated 2.3 x 1.8 cm right upper lobe nodule with maximum SUV of 13.9. In addition there was a band like region of architectural distortion anteriorly in the right upper lobe with maximum SUV of 2.5 concerning for malignancy. There was no hypermetabolic adenopathy observation in the chest and no evidence of distant metastatic  disease. The patient was seen by Dr. Elsworth Soho and on 08/14/2014 she underwent a video bronchoscopy with electromagnetic navigational bronchoscopy.  The final pathology from the right upper lobe lung nodule (Accession: [id number]) was consistent with poorly differentiated adenocarcinoma.The carcinoma demonstrates the following immunophenotype:TTF-1 - strong patchy expression, Cytokeratin 5/6 - negative expression. Dr. Elsworth Soho kindly referred the patient to me today for evaluation and recommendation regarding treatment of her condition. When seen today the patient is very anxious. She denied having any significant chest pain but has shortness breath with exertion with no significant cough or hemoptysis. She denied having any significant weight loss or night sweats. The patient denied having any headache or visual changes. Family history significant for a mother with breast cancer and father with throat cancer. The patient is a widow. Her husband died from esophageal cancer. She has 3 children 2 sons and 1 daughter. She was accompanied by her daughter Heather Banks today. The patient used to work as a Therapist, music. She has a history of smoking 2 packs per day for around 44 years and quit December 2015. She has no history of alcohol or drug abuse. HPI  Past Medical History  Diagnosis Date  . COPD (chronic obstructive pulmonary disease)     Albuterol neb as needed;Pulmicort neb daily  . GERD (gastroesophageal reflux disease)     takes Pantoprazole and Zantac daily  . Lung mass   . Pneumonia     hx of-last time about 4+yrs ago  . History of bronchitis     >5 yrs ago  . History of migraine  last one 2 wks ago  . Panic attacks     but doesn't take any meds    Past Surgical History  Procedure Laterality Date  . Abdominal hysterectomy    . Oophorectomy    . Appendectomy    . Tonsillectomy    . Video bronchoscopy with endobronchial navigation N/A 08/14/2014    Procedure: VIDEO BRONCHOSCOPY WITH  ENDOBRONCHIAL NAVIGATION;  Surgeon: Rigoberto Noel, MD;  Location: [facility] OR;  Service: Thoracic;  Laterality: N/A;    Family History  Problem Relation Age of Onset  . COPD Father     Social History History  Substance Use Topics  . Smoking status: Current Some Day Smoker -- 0.00 packs/day for 44 years    Types: Cigarettes  . Smokeless tobacco: Not on file     Comment: 2 cigs a day  . Alcohol Use: No     Comment: no alcohol in 7 yrs     No Known Allergies  Current Outpatient Prescriptions  Medication Sig Dispense Refill  . albuterol (PROVENTIL) (2.5 MG/3ML) 0.083% nebulizer solution Take 3 mLs (2.5 mg total) by nebulization every 6 (six) hours as needed for wheezing or shortness of breath. 75 mL 12  . ALPRAZolam (XANAX) 0.5 MG tablet Take 1 tablet (0.5 mg total) by mouth at bedtime as needed for anxiety. 60 tablet 0  . budesonide (PULMICORT) 0.25 MG/2ML nebulizer solution Take 0.25 mg by nebulization 2 (two) times daily.    Marland Kitchen HYDROcodone-acetaminophen (NORCO/VICODIN) 5-325 MG per tablet Take 1 tablet by mouth every 6 (six) hours as needed for moderate pain or severe pain. 30 tablet 0  . Melatonin 10 MG TABS Take 1 tablet by mouth at bedtime as needed.    . Multiple Vitamins-Minerals (MULTIVITAMIN WITH MINERALS) tablet Take 1 tablet by mouth daily.    . nicotine (NICODERM CQ) 21 mg/24hr patch Place 1 patch (21 mg total) onto the skin daily. 28 patch 0  . ranitidine (ZANTAC) 150 MG tablet Take 150 mg by mouth 2 (two) times daily.    . chlorpheniramine-HYDROcodone (TUSSIONEX) 10-8 MG/5ML LQCR   0   No current facility-administered medications for this visit.    Review of Systems  Constitutional: negative Eyes: negative Ears, nose, mouth, throat, and face: negative Respiratory: positive for dyspnea on exertion Cardiovascular: negative Gastrointestinal: negative Genitourinary:negative Integument/breast: negative Hematologic/lymphatic:  negative Musculoskeletal:negative Neurological: negative Behavioral/Psych: positive for anxiety Endocrine: negative Allergic/Immunologic: negative  Physical Exam  LKG:MWNUU, healthy, no distress, well nourished, well developed and anxious SKIN: skin color, texture, turgor are normal, no rashes or significant lesions HEAD: Normocephalic, No masses, lesions, tenderness or abnormalities EYES: normal, PERRLA EARS: External ears normal OROPHARYNX:no exudate, no erythema and lips, buccal mucosa, and tongue normal  NECK: supple, no adenopathy, no JVD LYMPH:  no palpable lymphadenopathy, no hepatosplenomegaly BREAST:not examined LUNGS: expiratory wheezes bilaterally, scattered rhonchi bilaterally HEART: regular rate & rhythm, no murmurs and no gallops ABDOMEN:abdomen soft, non-tender, normal bowel sounds and no masses or organomegaly BACK: Back symmetric, no curvature., No CVA tenderness EXTREMITIES:no joint deformities, effusion, or inflammation, no edema, no skin discoloration  NEURO: alert & oriented x 3 with fluent speech, no focal motor/sensory deficits  PERFORMANCE STATUS: ECOG 1  LABORATORY DATA: Lab Results  Component Value Date   WBC 8.2 08/23/2014   HGB 14.0 08/23/2014   HCT 42.6 08/23/2014   MCV 93.0 08/23/2014   PLT 259 08/23/2014      Chemistry      Component Value Date/Time   NA  139 08/23/2014 1119   NA 138 08/11/2014 0837   K 4.2 08/23/2014 1119   K 3.8 08/11/2014 0837   CL 103 08/11/2014 0837   CO2 23 08/23/2014 1119   CO2 23 08/11/2014 0837   BUN 8.3 08/23/2014 1119   BUN 8 08/11/2014 0837   CREATININE 0.7 08/23/2014 1119   CREATININE 0.57 08/11/2014 0837      Component Value Date/Time   CALCIUM 9.6 08/23/2014 1119   CALCIUM 9.0 08/11/2014 0837   ALKPHOS 138 08/23/2014 1119   ALKPHOS 145* 07/10/2014 0459   AST 13 08/23/2014 1119   AST 11 07/10/2014 0459   ALT 11 08/23/2014 1119   ALT 8 07/10/2014 0459   BILITOT 0.41 08/23/2014 1119   BILITOT  <0.2* 07/10/2014 0459       RADIOGRAPHIC STUDIES: Dg Chest 2 View  08/21/2014   CLINICAL DATA:  History of COPD. Pneumonia and bronchitis. Lung mass. Followup of pneumothorax.  EXAM: CHEST  2 VIEW  COMPARISON:  08/17/2014  FINDINGS: Hyperinflation. Midline trachea. Normal heart size. Atherosclerosis in the transverse aorta. No pleural fluid. Left apical and right upper lobe pulmonary lesions are again identified. Diffuse peribronchial thickening. Decrease right-sided pneumothorax. On the order of 5%. Visceral pleural line maximally 8 mm from the chest wall at the apex. 10 mm at the same level on the prior. Subpulmonic component is resolved.  IMPRESSION: Decrease in approximately 5% right apical pneumothorax.  Left apical and right upper lobe lung lesions, as before.   Electronically Signed   By: Abigail Miyamoto M.D.   On: 08/21/2014 13:52   Dg Chest 2 View  08/17/2014   CLINICAL DATA:  Subsequent encounter, follow-up right pneumothorax  EXAM: CHEST  2 VIEW  COMPARISON:  08/15/14, 08/14/2014  FINDINGS: The right pneumothorax has decreased slightly since 08/15/14. It now is approximately 10% volume, located in the lateral base and in the apex. Masses are again evident in both upper lobes. Hilar, mediastinal and cardiac contours appear unremarkable and unchanged.  IMPRESSION: Decreased size of the right pneumothorax, now approximately 10% volume.   Electronically Signed   By: Andreas Newport M.D.   On: 08/17/2014 09:45   Dg Chest 2 View  08/15/2014   CLINICAL DATA:  Status post bronchoscopy yesterday with biopsy of bilateral upper lobe masses.  EXAM: CHEST - 2 VIEW  COMPARISON:  08/14/2014  FINDINGS: Postprocedural pneumothorax on the right has enlarged since the prior study with apical, lateral and basilar components present. Overall volume of pneumothorax is approximately 15%. Mid tension physiology apparent by chest x-ray. Bilateral upper lobe mass is again visible. No pulmonary edema or pleural fluid.   IMPRESSION: Enlargement of right pneumothorax to roughly 15% volume.  These results will be called to the ordering clinician or representative by the Radiologist Assistant, and communication documented in the PACS or zVision Dashboard.   Electronically Signed   By: Aletta Edouard M.D.   On: 08/15/2014 15:02   Dg Chest 2 View  08/11/2014   CLINICAL DATA:  Pulmonary mass.  EXAM: CHEST  2 VIEW  COMPARISON:  PET scan 07/31/2014.  FINDINGS: Mediastinum and hilar structures are unremarkable. Again noted are bilateral upper lobe mass lesions. No new infiltrate. Heart size normal. No acute bony abnormality.  IMPRESSION: Bilateral upper lobe pulmonary mass lesions previously identified to the PET positive, these are suspicious for malignancy. Similar findings noted on prior PET-CT of 07/31/2014.   Electronically Signed   By: Marcello Moores  Register   On: 08/11/2014 10:39  Nm Pet Image Initial (pi) Skull Base To Thigh  07/31/2014   CLINICAL DATA:  Initial treatment strategy for left upper lobe lung mass and right upper lobe nodule.  EXAM: NUCLEAR MEDICINE PET SKULL BASE TO THIGH  TECHNIQUE: 9.0 mCi F-18 FDG was injected intravenously. Full-ring PET imaging was performed from the skull base to thigh after the radiotracer. CT data was obtained and used for attenuation correction and anatomic localization.  FASTING BLOOD GLUCOSE:  Value: 101 mg/dl  COMPARISON:  07/11/2014  FINDINGS: NECK  No hypermetabolic lymph nodes in the neck.  CHEST  Spiculated 3.7 by 3.5 cm left apical lung mass observed, maximum standard uptake value 22.6. No definite chest wall invasion. No adjacent rib destruction.  Marginally spiculated 2.3 by 1.8 cm right upper  No hypermetabolic adenopathy observed in the chest. Lobe pulmonary nodule, image 19 series 6, maximum standard uptake value 13.9.  Bandlike region of architectural distortion anteriorly in the right upper lobe has a maximum standard uptake value of 2.5, concerning for malignancy given the lack  of a well-defined solid component.  ABDOMEN/PELVIS  No abnormal hypermetabolic activity within the liver, pancreas, adrenal glands, or spleen. No hypermetabolic lymph nodes in the abdomen or pelvis. Aortoiliac atherosclerotic vascular disease.  SKELETON  No focal hypermetabolic activity to suggest skeletal metastasis.  IMPRESSION: 1. Left apical and right upper lobe hypermetabolic spiculated pulmonary nodules, highly suspicious for malignancy. Active granulomatous process such as tuberculosis possible but less likely. There is also a band of architectural distortion anteriorly in the right upper lobe which is mildly hypermetabolic and probably malignant as well. No significant pathologic adenopathy in the chest. If this is non-small cell lung cancer, then the bilaterality indicates stage IV disease.   Electronically Signed   By: Sherryl Barters M.D.   On: 07/31/2014 15:40   Dg Chest Port 1 View  08/14/2014   CLINICAL DATA:  Post bronchoscopy and biopsy  EXAM: PORTABLE CHEST - 1 VIEW  COMPARISON:  08/11/2014  FINDINGS: Cardiomediastinal silhouette is stable. Nodular mass again noted in left upper lobe measures 4.4 cm. Nodular mass in right upper lobe measures 1.7 cm. Tiny amount of hemorrhage or infiltrate surrounding the right upper lobe nodule. Small right upper pneumothorax about 5%.  IMPRESSION: Nodular mass again noted in left upper lobe measures 4.4 cm. Nodular mass in right upper lobe measures 1.7 cm. Tiny amount of hemorrhage or infiltrate surrounding the right upper lobe nodule. Small right upper pneumothorax about 5%.   Electronically Signed   By: Lahoma Crocker M.D.   On: 08/14/2014 13:51   Dg C-arm Bronchoscopy  08/14/2014   CLINICAL DATA:    C-ARM BRONCHOSCOPY  Fluoroscopy was utilized by the requesting physician.  No radiographic  interpretation.     ASSESSMENT: This is a very pleasant 64 years old white female recently diagnosed with a stage IV (T2a, N0, M1a) non-small cell lung cancer,  adenocarcinoma presented with bilateral pulmonary lesions. This could be also consider as synchronous primary tumors. The patient has significant COPD and long history of smoking. I don't think she would be a good candidate for surgical resection of the bilateral tumors.   PLAN: I had a lengthy discussion with the patient and her daughter today about her current disease stage, prognosis and treatment options.  I gave the patient the option of palliative care and hospice referral versus consideration of systemic chemotherapy with carboplatin and Alimta. If she has good response to this treatment, we may consider the patient for stereotactic  radiotherapy in the future if no evidence for metastatic disease at that time. She is interested in proceeding with systemic chemotherapy. I discussed with the patient adverse effect of this treatment including but not limited to alopecia, myelosuppression, nausea and vomiting, peripheral neuropathy, liver or renal dysfunction. The patient will receive vitamin B 12 injection today. I will call her pharmacy with prescription for Compazine 10 mg by mouth every 6 hours as needed for nausea, folic acid 1 mg by mouth daily in addition to Decadron 4 mg by mouth twice a day the day before, day of and day after the chemotherapy every 3 weeks. I will arrange for the patient to have a chemotherapy education class before starting the first dose of her treatment. She is expected to start the first cycle of her chemotherapy next week. The patient would come back for follow-up visit in 2 weeks for reevaluation and management of any adverse effect of her treatment. She was advised to call immediately if she has any concerning symptoms in the interval. I strongly encouraged the patient to continue with her smoke cessation. The patient voices understanding of current disease status and treatment options and is in agreement with the current care plan.  All questions were answered.  The patient knows to call the clinic with any problems, questions or concerns. We can certainly see the patient much sooner if necessary.  Thank you so much for allowing me to participate in the care of Heather Banks. I will continue to follow up the patient with you and assist in her care.  I spent 55 minutes counseling the patient face to face. The total time spent in the appointment was 80 minutes.  Disclaimer: This note was dictated with voice recognition software. Similar sounding words can inadvertently be transcribed and may not be corrected upon review.   Gennavieve Huq K. 08/23/2014, 12:18 PM

## 2014-08-23 NOTE — [facility] Oncology Navigator Note (Unsigned)
Spoke with patient today at [facility] new patient visit.  Gave and explained resource information about lung cancer.    Per Dr. Worthy Flank request, I emailed Tammy at [facility] pathology dept to send tissue to [facility] for EGFR/ALK

## 2014-08-23 NOTE — Progress Notes (Signed)
Checked in new pt with no insurance.  Pt has applied for Medicaid in [place] and is awaiting approval.  I gave her an application to apply for a discount thru the hospital until she gets approved for Medicaid.  Pt has Raquel's card for any insurance questions or concerns.

## 2014-08-24 ENCOUNTER — Other Ambulatory Visit: Payer: Self-pay | Admitting: Radiology

## 2014-08-24 ENCOUNTER — Telehealth: Payer: Self-pay | Admitting: Internal Medicine

## 2014-08-24 NOTE — Telephone Encounter (Signed)
Confirm appointments and Patient checks MyChart

## 2014-08-25 ENCOUNTER — Ambulatory Visit (HOSPITAL_COMMUNITY): Admission: RE | Admit: 2014-08-25 | Payer: Medicaid - Out of State | Source: Ambulatory Visit

## 2014-08-25 ENCOUNTER — Other Ambulatory Visit: Payer: Self-pay | Admitting: *Deleted

## 2014-08-25 ENCOUNTER — Ambulatory Visit (HOSPITAL_COMMUNITY)
Admission: RE | Admit: 2014-08-25 | Discharge: 2014-08-25 | Disposition: A | Discharge status: Home / Self Care | Payer: Medicaid - Out of State | Source: Ambulatory Visit | Attending: Internal Medicine | Admitting: Internal Medicine

## 2014-08-25 ENCOUNTER — Ambulatory Visit (HOSPITAL_COMMUNITY)
Admission: RE | Admit: 2014-08-25 | Discharge: 2014-08-25 | Disposition: A | Discharge status: Home / Self Care | Payer: Medicaid - Out of State | Source: Ambulatory Visit | Attending: Radiology | Admitting: Radiology

## 2014-08-25 ENCOUNTER — Other Ambulatory Visit: Payer: Self-pay | Admitting: Internal Medicine

## 2014-08-25 ENCOUNTER — Encounter (HOSPITAL_COMMUNITY): Payer: Self-pay

## 2014-08-25 ENCOUNTER — Telehealth: Payer: Self-pay | Admitting: *Deleted

## 2014-08-25 DIAGNOSIS — C3402 Malignant neoplasm of left main bronchus: Secondary | ICD-10-CM | POA: Diagnosis not present

## 2014-08-25 DIAGNOSIS — J95811 Postprocedural pneumothorax: Secondary | ICD-10-CM | POA: Insufficient documentation

## 2014-08-25 DIAGNOSIS — J939 Pneumothorax, unspecified: Secondary | ICD-10-CM

## 2014-08-25 DIAGNOSIS — Z79899 Other long term (current) drug therapy: Secondary | ICD-10-CM | POA: Insufficient documentation

## 2014-08-25 DIAGNOSIS — F17210 Nicotine dependence, cigarettes, uncomplicated: Secondary | ICD-10-CM | POA: Insufficient documentation

## 2014-08-25 DIAGNOSIS — I878 Other specified disorders of veins: Secondary | ICD-10-CM

## 2014-08-25 DIAGNOSIS — Z7951 Long term (current) use of inhaled steroids: Secondary | ICD-10-CM | POA: Diagnosis not present

## 2014-08-25 DIAGNOSIS — K219 Gastro-esophageal reflux disease without esophagitis: Secondary | ICD-10-CM | POA: Diagnosis not present

## 2014-08-25 DIAGNOSIS — Z452 Encounter for adjustment and management of vascular access device: Secondary | ICD-10-CM | POA: Insufficient documentation

## 2014-08-25 DIAGNOSIS — C3401 Malignant neoplasm of right main bronchus: Secondary | ICD-10-CM | POA: Diagnosis not present

## 2014-08-25 DIAGNOSIS — J449 Chronic obstructive pulmonary disease, unspecified: Secondary | ICD-10-CM | POA: Insufficient documentation

## 2014-08-25 DIAGNOSIS — F410 Panic disorder [episodic paroxysmal anxiety] without agoraphobia: Secondary | ICD-10-CM | POA: Diagnosis not present

## 2014-08-25 LAB — CBC WITH DIFFERENTIAL/PLATELET
Basophils Absolute: 0 10*3/uL (ref 0.0–0.1)
Basophils Relative: 1 % (ref 0–1)
EOS ABS: 0.2 10*3/uL (ref 0.0–0.7)
Eosinophils Relative: 2 % (ref 0–5)
HCT: 41.1 % (ref 36.0–46.0)
Hemoglobin: 13.7 g/dL (ref 12.0–15.0)
LYMPHS PCT: 20 % (ref 12–46)
Lymphs Abs: 1.8 10*3/uL (ref 0.7–4.0)
MCH: 30.8 pg (ref 26.0–34.0)
MCHC: 33.3 g/dL (ref 30.0–36.0)
MCV: 92.4 fL (ref 78.0–100.0)
MONO ABS: 0.8 10*3/uL (ref 0.1–1.0)
Monocytes Relative: 9 % (ref 3–12)
Neutro Abs: 6.1 10*3/uL (ref 1.7–7.7)
Neutrophils Relative %: 68 % (ref 43–77)
PLATELETS: 289 10*3/uL (ref 150–400)
RBC: 4.45 MIL/uL (ref 3.87–5.11)
RDW: 14.1 % (ref 11.5–15.5)
WBC: 8.8 10*3/uL (ref 4.0–10.5)

## 2014-08-25 LAB — PROTIME-INR
INR: 0.92 (ref 0.00–1.49)
PROTHROMBIN TIME: 12.4 s (ref 11.6–15.2)

## 2014-08-25 MED ORDER — MIDAZOLAM HCL 2 MG/2ML IJ SOLN
INTRAMUSCULAR | Status: AC | PRN
  Administered 2014-08-25: 1 mg via INTRAVENOUS
  Administered 2014-08-25: 0.5 mg via INTRAVENOUS
  Administered 2014-08-25: 1 mg via INTRAVENOUS

## 2014-08-25 MED ORDER — CEFAZOLIN SODIUM-DEXTROSE 2-3 GM-% IV SOLR
2.0000 g | Freq: Once | INTRAVENOUS | Status: AC
  Administered 2014-08-25: 2 g via INTRAVENOUS

## 2014-08-25 MED ORDER — SODIUM CHLORIDE 0.9 % IV SOLN
INTRAVENOUS | Status: DC
  Administered 2014-08-25: 12:00:00 via INTRAVENOUS

## 2014-08-25 MED ORDER — LIDOCAINE-EPINEPHRINE 2 %-1:100000 IJ SOLN
INTRAMUSCULAR | Status: AC
  Filled 2014-08-25: qty 1

## 2014-08-25 MED ORDER — MIDAZOLAM HCL 2 MG/2ML IJ SOLN
INTRAMUSCULAR | Status: AC
Start: 2014-08-25 — End: 2014-08-25
  Filled 2014-08-25: qty 6

## 2014-08-25 MED ORDER — HEPARIN SOD (PORK) LOCK FLUSH 100 UNIT/ML IV SOLN
INTRAVENOUS | Status: AC
  Filled 2014-08-25: qty 5

## 2014-08-25 MED ORDER — HEPARIN SOD (PORK) LOCK FLUSH 100 UNIT/ML IV SOLN
INTRAVENOUS | Status: AC | PRN
  Administered 2014-08-25: 500 [IU]

## 2014-08-25 MED ORDER — FENTANYL CITRATE 0.05 MG/ML IJ SOLN
INTRAMUSCULAR | Status: AC
  Filled 2014-08-25: qty 4

## 2014-08-25 MED ORDER — CEFAZOLIN SODIUM-DEXTROSE 2-3 GM-% IV SOLR
INTRAVENOUS | Status: AC
  Filled 2014-08-25: qty 50

## 2014-08-25 MED ORDER — FENTANYL CITRATE 0.05 MG/ML IJ SOLN
INTRAMUSCULAR | Status: AC | PRN
  Administered 2014-08-25: 50 ug via INTRAVENOUS
  Administered 2014-08-25: 25 ug via INTRAVENOUS
  Administered 2014-08-25: 50 ug via INTRAVENOUS

## 2014-08-25 NOTE — Telephone Encounter (Signed)
Per cancer conference on 08/24/14 it was discussed patient needs more tissue to be sent for genetic testing.  I called patient to update her about test.    Patient has other questions about her medication, I clarified.  Patient was thankful for the call.

## 2014-08-25 NOTE — Discharge Instructions (Signed)
Implanted Port Insertion, Care After Refer to this sheet in the next few weeks. These instructions provide you with information on caring for yourself after your procedure. Your health care provider may also give you more specific instructions. Your treatment has been planned according to current medical practices, but problems sometimes occur. Call your health care provider if you have any problems or questions after your procedure. WHAT TO EXPECT AFTER THE PROCEDURE After your procedure, it is typical to have the following:   Discomfort at the port insertion site. Ice packs to the area will help.  Bruising on the skin over the port. This will subside in 3-4 days. HOME CARE INSTRUCTIONS  After your port is placed, you will get a manufacturer's information card. The card has information about your port. Keep this card with you at all times.   Know what kind of port you have. There are many types of ports available.   Wear a medical alert bracelet in case of an emergency. This can help alert health care workers that you have a port.   The port can stay in for as long as your health care provider believes it is necessary.   A home health care nurse may give medicines and take care of the port.   You or a family member can get special training and directions for giving medicine and taking care of the port at home.  SEEK MEDICAL CARE IF:   Your port does not flush or you are unable to get a blood return.   You have a fever or chills. SEEK IMMEDIATE MEDICAL CARE IF:  You have new fluid or pus coming from your incision.   You notice a bad smell coming from your incision site.   You have swelling, pain, or more redness at the incision or port site.   You have chest pain or shortness of breath. Document Released: 05/04/2013 Document Revised: 07/19/2013 Document Reviewed: 05/04/2013 [facility] Patient Information 2015 [place], [place]. This information is not intended to replace  advice given to you by your health care provider. Make sure you discuss any questions you have with your health care provider.   Conscious Sedation, Adult, Care After Refer to this sheet in the next few weeks. These instructions provide you with information on caring for yourself after your procedure. Your health care provider may also give you more specific instructions. Your treatment has been planned according to current medical practices, but problems sometimes occur. Call your health care provider if you have any problems or questions after your procedure. WHAT TO EXPECT AFTER THE PROCEDURE  After your procedure:  You may feel sleepy, clumsy, and have poor balance for several hours.  Vomiting may occur if you eat too soon after the procedure. HOME CARE INSTRUCTIONS  Do not participate in any activities where you could become injured for at least 24 hours. Do not:  Drive.  Swim.  Ride a bicycle.  Operate heavy machinery.  Cook.  Use power tools.  Climb ladders.  Work from a high place.  Do not make important decisions or sign legal documents until you are improved.  If you vomit, drink water, juice, or soup when you can drink without vomiting. Make sure you have little or no nausea before eating solid foods.  Only take over-the-counter or prescription medicines for pain, discomfort, or fever as directed by your health care provider.  Make sure you and your family fully understand everything about the medicines given to you, including what  side effects may occur.  You should not drink alcohol, take sleeping pills, or take medicines that cause drowsiness for at least 24 hours.  If you smoke, do not smoke without supervision.  If you are feeling better, you may resume normal activities 24 hours after you were sedated.  Keep all appointments with your health care provider. SEEK MEDICAL CARE IF:  Your skin is pale or bluish in color.  You continue to feel nauseous or  vomit.  Your pain is getting worse and is not helped by medicine.  You have bleeding or swelling.  You are still sleepy or feeling clumsy after 24 hours. SEEK IMMEDIATE MEDICAL CARE IF:  You develop a rash.  You have difficulty breathing.  You develop any type of allergic problem.  You have a fever. MAKE SURE YOU:  Understand these instructions.  Will watch your condition.  Will get help right away if you are not doing well or get worse. Document Released: 05/04/2013 Document Reviewed: 05/04/2013 [facility] Patient Information 2015 [place], [place]. This information is not intended to replace advice given to you by your health care provider. Make sure you discuss any questions you have with your health care provider.

## 2014-08-25 NOTE — H&P (Signed)
Chief Complaint: "I'm here to get a port a cath placed"  Referring Physician(s): Mohamed,Mohamed  History of Present Illness: Heather Banks is a 64 y.o. 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 She is s/p bronchoscopy with bx on 08/14/2014. F/u CXR revealed small right apical pneumothorax. She presents today for port a cath placement for chemotherapy.  Past Medical History  Diagnosis Date  . COPD (chronic obstructive pulmonary disease)     Albuterol neb as needed;Pulmicort neb daily  . GERD (gastroesophageal reflux disease)     takes Pantoprazole and Zantac daily  . Lung mass   . Pneumonia     hx of-last time about 4+yrs ago  . History of bronchitis     >5 yrs ago  . History of migraine     last one 2 wks ago  . Panic attacks     but doesn't take any meds    Past Surgical History  Procedure Laterality Date  . Abdominal hysterectomy    . Oophorectomy    . Appendectomy    . Tonsillectomy    . Video bronchoscopy with endobronchial navigation N/A 08/14/2014    Procedure: VIDEO BRONCHOSCOPY WITH ENDOBRONCHIAL NAVIGATION;  Surgeon: Rigoberto Noel, MD;  Location: [facility] OR;  Service: Thoracic;  Laterality: N/A;    Allergies: Review of patient's allergies indicates no known allergies.  Medications: Prior to Admission medications   Medication Sig Start Date End Date Taking? Authorizing Provider  albuterol (PROVENTIL) (2.5 MG/3ML) 0.083% nebulizer solution Take 3 mLs (2.5 mg total) by nebulization every 6 (six) hours as needed for wheezing or shortness of breath. 07/12/14   Reyne Dumas, MD  ALPRAZolam Duanne Moron) 0.5 MG tablet Take 1 tablet (0.5 mg total) by mouth at bedtime as needed for anxiety. 08/21/14   Rigoberto Noel, MD  budesonide (PULMICORT) 0.25 MG/2ML nebulizer solution Take 0.25 mg by nebulization 2 (two) times daily.    Historical Provider, MD  chlorpheniramine-HYDROcodone ([place]) 10-8 MG/5ML [facility]  07/12/14    Historical Provider, MD  dexamethasone (DECADRON) 4 MG tablet 4 mg by mouth twice a day the day before, day of and day after the chemotherapy every 3 weeks 08/23/14   Curt Bears, MD  folic acid (FOLVITE) 1 MG tablet Take 1 tablet (1 mg total) by mouth daily. 08/23/14   Curt Bears, MD  HYDROcodone-acetaminophen (NORCO/VICODIN) 5-325 MG per tablet Take 1 tablet by mouth every 6 (six) hours as needed for moderate pain or severe pain. 08/15/14   Deneise Lever, MD  Melatonin 10 MG TABS Take 1 tablet by mouth at bedtime as needed.    Historical Provider, MD  Multiple Vitamins-Minerals (MULTIVITAMIN WITH MINERALS) tablet Take 1 tablet by mouth daily.    Historical Provider, MD  nicotine (NICODERM CQ) 21 mg/24hr patch Place 1 patch (21 mg total) onto the skin daily. 08/15/14   Deneise Lever, MD  prochlorperazine (COMPAZINE) 10 MG tablet Take 1 tablet (10 mg total) by mouth every 6 (six) hours as needed for nausea or vomiting. 08/23/14   Curt Bears, MD  ranitidine (ZANTAC) 150 MG tablet Take 150 mg by mouth 2 (two) times daily.    Historical Provider, MD    Family History  Problem Relation Age of Onset  . COPD Father     History   Social History  . Marital Status: Widowed    Spouse Name: N/A    Number of Children: N/A  . Years of Education:  N/A   Social History Main Topics  . Smoking status: Current Some Day Smoker -- 0.00 packs/day for 44 years    Types: Cigarettes  . Smokeless tobacco: None     Comment: 2 cigs a day  . Alcohol Use: No     Comment: no alcohol in 7 yrs   . Drug Use: None  . Sexual Activity: None   Other Topics Concern  . None   Social History Narrative      Review of Systems  Constitutional: Negative for fever and chills.  Respiratory: Negative for shortness of breath.        Occ cough  Cardiovascular: Negative for chest pain.  Gastrointestinal: Negative for nausea, vomiting and abdominal pain.  Genitourinary: Negative for dysuria and hematuria.    Musculoskeletal: Negative for back pain.  Neurological: Positive for headaches.    Vital Signs: BP 129/82 mmHg  Pulse 86  Temp(Src) 98 F (36.7 C) (Oral)  Resp 18  Ht 5\' 5"  (1.651 m)  Wt 163 lb (73.936 kg)  BMI 27.12 kg/m2  SpO2 97%  Physical Exam  Constitutional: She is oriented to person, place, and time. She appears well-developed and well-nourished.  Cardiovascular: Normal rate and regular rhythm.   Pulmonary/Chest:  Distant BS bilat; few rhonchi  Abdominal: Soft. Bowel sounds are normal. There is no tenderness.  Musculoskeletal: Normal range of motion. She exhibits no edema.  Neurological: She is alert and oriented to person, place, and time.    Imaging: Dg Chest 2 View  08/21/2014   CLINICAL DATA:  History of COPD. Pneumonia and bronchitis. Lung mass. Followup of pneumothorax.  EXAM: CHEST  2 VIEW  COMPARISON:  08/17/2014  FINDINGS: Hyperinflation. Midline trachea. Normal heart size. Atherosclerosis in the transverse aorta. No pleural fluid. Left apical and right upper lobe pulmonary lesions are again identified. Diffuse peribronchial thickening. Decrease right-sided pneumothorax. On the order of 5%. Visceral pleural line maximally 8 mm from the chest wall at the apex. 10 mm at the same level on the prior. Subpulmonic component is resolved.  IMPRESSION: Decrease in approximately 5% right apical pneumothorax.  Left apical and right upper lobe lung lesions, as before.   Electronically Signed   By: Abigail Miyamoto M.D.   On: 08/21/2014 13:52   Dg Chest 2 View  08/17/2014   CLINICAL DATA:  Subsequent encounter, follow-up right pneumothorax  EXAM: CHEST  2 VIEW  COMPARISON:  08/15/14, 08/14/2014  FINDINGS: The right pneumothorax has decreased slightly since 08/15/14. It now is approximately 10% volume, located in the lateral base and in the apex. Masses are again evident in both upper lobes. Hilar, mediastinal and cardiac contours appear unremarkable and unchanged.  IMPRESSION:  Decreased size of the right pneumothorax, now approximately 10% volume.   Electronically Signed   By: Andreas Newport M.D.   On: 08/17/2014 09:45   Dg Chest 2 View  08/15/2014   CLINICAL DATA:  Status post bronchoscopy yesterday with biopsy of bilateral upper lobe masses.  EXAM: CHEST - 2 VIEW  COMPARISON:  08/14/2014  FINDINGS: Postprocedural pneumothorax on the right has enlarged since the prior study with apical, lateral and basilar components present. Overall volume of pneumothorax is approximately 15%. Mid tension physiology apparent by chest x-ray. Bilateral upper lobe mass is again visible. No pulmonary edema or pleural fluid.  IMPRESSION: Enlargement of right pneumothorax to roughly 15% volume.  These results will be called to the ordering clinician or representative by the Radiologist Assistant, and communication documented in the  PACS or zVision Dashboard.   Electronically Signed   By: Aletta Edouard M.D.   On: 08/15/2014 15:02   Dg Chest 2 View  08/11/2014   CLINICAL DATA:  Pulmonary mass.  EXAM: CHEST  2 VIEW  COMPARISON:  PET scan 07/31/2014.  FINDINGS: Mediastinum and hilar structures are unremarkable. Again noted are bilateral upper lobe mass lesions. No new infiltrate. Heart size normal. No acute bony abnormality.  IMPRESSION: Bilateral upper lobe pulmonary mass lesions previously identified to the PET positive, these are suspicious for malignancy. Similar findings noted on prior PET-CT of 07/31/2014.   Electronically Signed   By: Marcello Moores  Register   On: 08/11/2014 10:39   Nm Pet Image Initial (pi) Skull Base To Thigh  07/31/2014   CLINICAL DATA:  Initial treatment strategy for left upper lobe lung mass and right upper lobe nodule.  EXAM: NUCLEAR MEDICINE PET SKULL BASE TO THIGH  TECHNIQUE: 9.0 mCi F-18 FDG was injected intravenously. Full-ring PET imaging was performed from the skull base to thigh after the radiotracer. CT data was obtained and used for attenuation correction and anatomic  localization.  FASTING BLOOD GLUCOSE:  Value: 101 mg/dl  COMPARISON:  07/11/2014  FINDINGS: NECK  No hypermetabolic lymph nodes in the neck.  CHEST  Spiculated 3.7 by 3.5 cm left apical lung mass observed, maximum standard uptake value 22.6. No definite chest wall invasion. No adjacent rib destruction.  Marginally spiculated 2.3 by 1.8 cm right upper  No hypermetabolic adenopathy observed in the chest. Lobe pulmonary nodule, image 19 series 6, maximum standard uptake value 13.9.  Bandlike region of architectural distortion anteriorly in the right upper lobe has a maximum standard uptake value of 2.5, concerning for malignancy given the lack of a well-defined solid component.  ABDOMEN/PELVIS  No abnormal hypermetabolic activity within the liver, pancreas, adrenal glands, or spleen. No hypermetabolic lymph nodes in the abdomen or pelvis. Aortoiliac atherosclerotic vascular disease.  SKELETON  No focal hypermetabolic activity to suggest skeletal metastasis.  IMPRESSION: 1. Left apical and right upper lobe hypermetabolic spiculated pulmonary nodules, highly suspicious for malignancy. Active granulomatous process such as tuberculosis possible but less likely. There is also a band of architectural distortion anteriorly in the right upper lobe which is mildly hypermetabolic and probably malignant as well. No significant pathologic adenopathy in the chest. If this is non-small cell lung cancer, then the bilaterality indicates stage IV disease.   Electronically Signed   By: Sherryl Barters M.D.   On: 07/31/2014 15:40   Dg Chest Port 1 View  08/14/2014   CLINICAL DATA:  Post bronchoscopy and biopsy  EXAM: PORTABLE CHEST - 1 VIEW  COMPARISON:  08/11/2014  FINDINGS: Cardiomediastinal silhouette is stable. Nodular mass again noted in left upper lobe measures 4.4 cm. Nodular mass in right upper lobe measures 1.7 cm. Tiny amount of hemorrhage or infiltrate surrounding the right upper lobe nodule. Small right upper pneumothorax  about 5%.  IMPRESSION: Nodular mass again noted in left upper lobe measures 4.4 cm. Nodular mass in right upper lobe measures 1.7 cm. Tiny amount of hemorrhage or infiltrate surrounding the right upper lobe nodule. Small right upper pneumothorax about 5%.   Electronically Signed   By: Lahoma Crocker M.D.   On: 08/14/2014 13:51   Dg C-arm Bronchoscopy  08/14/2014   CLINICAL DATA:    C-ARM BRONCHOSCOPY  Fluoroscopy was utilized by the requesting physician.  No radiographic  interpretation.     Labs:  CBC:  Recent Labs  07/09/14 1525  08/11/14 0837 08/23/14 1119 08/25/14 1155  WBC 9.8 9.1 8.2 8.8  HGB 13.6 13.4 14.0 13.7  HCT 41.1 40.4 42.6 41.1  PLT 319 272 259 289    COAGS:  Recent Labs  07/09/14 2349 07/10/14 0459 08/25/14 1155  INR 0.90  --  0.92  APTT  --  40*  --     BMP:  Recent Labs  07/09/14 1525 07/10/14 0459 08/11/14 0837 08/23/14 1119  NA 142 141 138 139  K 3.5* 3.3* 3.8 4.2  CL 102 100 103  --   CO2 26 26 23 23   GLUCOSE 98 146* 98 99  BUN 7 4* 8 8.3  CALCIUM 8.9 9.1 9.0 9.6  CREATININE 0.50 0.41* 0.57 0.7  GFRNONAA >90 >90 >90  --   GFRAA >90 >90 >90  --     LIVER FUNCTION TESTS:  Recent Labs  07/10/14 0459 08/23/14 1119  BILITOT <0.2* 0.41  AST 11 13  ALT 8 11  ALKPHOS 145* 138  PROT 7.0 7.6  ALBUMIN 3.1* 4.0    TUMOR MARKERS: No results for input(s): AFPTM, CEA, CA199, CHROMGRNA in the last 8760 hours.  Assessment and Plan: Heather Banks is a 64 y.o. 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 She is s/p bronchoscopy with bx on 08/14/2014. F/u CXR revealed small right apical pneumothorax. She presents today for port a cath placement for chemotherapy. Details/risks of procedure d/w pt/daughter with their understanding and consent. Will check f/u CXR preprocedure to evaluate for residual ptx.   Signed: Autumn Messing 08/25/2014, 1:09  PM

## 2014-08-25 NOTE — Procedures (Signed)
Successful placement of right IJ approach port-a-cath with tip at the superior caval atrial junction. The catheter is ready for immediate use. No immediate post procedural complications.

## 2014-08-26 ENCOUNTER — Other Ambulatory Visit: Payer: Self-pay | Admitting: Internal Medicine

## 2014-08-26 DIAGNOSIS — C3490 Malignant neoplasm of unspecified part of unspecified bronchus or lung: Secondary | ICD-10-CM

## 2014-08-28 ENCOUNTER — Ambulatory Visit: Payer: Self-pay

## 2014-08-28 ENCOUNTER — Ambulatory Visit: Payer: Self-pay | Admitting: Internal Medicine

## 2014-08-28 ENCOUNTER — Telehealth: Payer: Self-pay | Admitting: Pulmonary Disease

## 2014-08-28 ENCOUNTER — Other Ambulatory Visit: Payer: Self-pay | Admitting: *Deleted

## 2014-08-28 DIAGNOSIS — C3490 Malignant neoplasm of unspecified part of unspecified bronchus or lung: Secondary | ICD-10-CM

## 2014-08-28 DIAGNOSIS — C3411 Malignant neoplasm of upper lobe, right bronchus or lung: Secondary | ICD-10-CM

## 2014-08-28 MED ORDER — LIDOCAINE-PRILOCAINE 2.5-2.5 % EX CREA: 1.0000 "application " | TOPICAL_CREAM | CUTANEOUS | Status: DC | PRN

## 2014-08-28 NOTE — Telephone Encounter (Signed)
ATC fast busy signal WCB 

## 2014-08-29 ENCOUNTER — Other Ambulatory Visit: Payer: Self-pay

## 2014-08-29 NOTE — Telephone Encounter (Signed)
Attempted to call. Received busy signal. Will try back.

## 2014-08-30 ENCOUNTER — Other Ambulatory Visit (HOSPITAL_BASED_OUTPATIENT_CLINIC_OR_DEPARTMENT_OTHER): Payer: Self-pay

## 2014-08-30 ENCOUNTER — Ambulatory Visit (HOSPITAL_BASED_OUTPATIENT_CLINIC_OR_DEPARTMENT_OTHER): Payer: Self-pay

## 2014-08-30 DIAGNOSIS — Z5111 Encounter for antineoplastic chemotherapy: Secondary | ICD-10-CM

## 2014-08-30 DIAGNOSIS — C3490 Malignant neoplasm of unspecified part of unspecified bronchus or lung: Secondary | ICD-10-CM

## 2014-08-30 DIAGNOSIS — C3411 Malignant neoplasm of upper lobe, right bronchus or lung: Secondary | ICD-10-CM

## 2014-08-30 LAB — CBC WITH DIFFERENTIAL/PLATELET
BASO%: 0.1 % (ref 0.0–2.0)
Basophils Absolute: 0 10*3/uL (ref 0.0–0.1)
EOS ABS: 0 10*3/uL (ref 0.0–0.5)
EOS%: 0 % (ref 0.0–7.0)
HCT: 38.8 % (ref 34.8–46.6)
HEMOGLOBIN: 13 g/dL (ref 11.6–15.9)
LYMPH%: 14.9 % (ref 14.0–49.7)
MCH: 31 pg (ref 25.1–34.0)
MCHC: 33.5 g/dL (ref 31.5–36.0)
MCV: 92.4 fL (ref 79.5–101.0)
MONO#: 0.8 10*3/uL (ref 0.1–0.9)
MONO%: 6.5 % (ref 0.0–14.0)
NEUT#: 9.4 10*3/uL — ABNORMAL HIGH (ref 1.5–6.5)
NEUT%: 78.5 % — ABNORMAL HIGH (ref 38.4–76.8)
PLATELETS: 310 10*3/uL (ref 145–400)
RBC: 4.2 10*6/uL (ref 3.70–5.45)
RDW: 13.9 % (ref 11.2–14.5)
WBC: 12 10*3/uL — ABNORMAL HIGH (ref 3.9–10.3)
lymph#: 1.8 10*3/uL (ref 0.9–3.3)

## 2014-08-30 LAB — COMPREHENSIVE METABOLIC PANEL (CC13)
ALT: 9 U/L (ref 0–55)
AST: 12 U/L (ref 5–34)
Albumin: 3.8 g/dL (ref 3.5–5.0)
Alkaline Phosphatase: 116 U/L (ref 40–150)
Anion Gap: 15 mEq/L — ABNORMAL HIGH (ref 3–11)
BILIRUBIN TOTAL: 0.21 mg/dL (ref 0.20–1.20)
BUN: 9.5 mg/dL (ref 7.0–26.0)
CALCIUM: 9.4 mg/dL (ref 8.4–10.4)
CO2: 19 meq/L — AB (ref 22–29)
Chloride: 104 mEq/L (ref 98–109)
Creatinine: 0.7 mg/dL (ref 0.6–1.1)
Glucose: 147 mg/dl — ABNORMAL HIGH (ref 70–140)
Potassium: 3.4 mEq/L — ABNORMAL LOW (ref 3.5–5.1)
SODIUM: 139 meq/L (ref 136–145)
Total Protein: 7.3 g/dL (ref 6.4–8.3)

## 2014-08-30 MED ORDER — SODIUM CHLORIDE 0.9 % IV SOLN
556.5000 mg | Freq: Once | INTRAVENOUS | Status: AC
  Administered 2014-08-30: 560 mg via INTRAVENOUS
  Filled 2014-08-30: qty 56

## 2014-08-30 MED ORDER — SODIUM CHLORIDE 0.9 % IJ SOLN
10.0000 mL | INTRAMUSCULAR | Status: DC | PRN
  Administered 2014-08-30: 10 mL
  Filled 2014-08-30: qty 10

## 2014-08-30 MED ORDER — HEPARIN SOD (PORK) LOCK FLUSH 100 UNIT/ML IV SOLN
500.0000 [IU] | Freq: Once | INTRAVENOUS | Status: AC | PRN
  Administered 2014-08-30: 500 [IU]
  Filled 2014-08-30: qty 5

## 2014-08-30 MED ORDER — DEXAMETHASONE SODIUM PHOSPHATE 20 MG/5ML IJ SOLN
INTRAMUSCULAR | Status: AC
  Filled 2014-08-30: qty 5

## 2014-08-30 MED ORDER — ONDANSETRON 16 MG/50ML IVPB (CHCC)
INTRAVENOUS | Status: AC
  Filled 2014-08-30: qty 16

## 2014-08-30 MED ORDER — SODIUM CHLORIDE 0.9 % IV SOLN
500.0000 mg/m2 | Freq: Once | INTRAVENOUS | Status: AC
  Administered 2014-08-30: 925 mg via INTRAVENOUS
  Filled 2014-08-30: qty 37

## 2014-08-30 MED ORDER — SODIUM CHLORIDE 0.9 % IV SOLN
Freq: Once | INTRAVENOUS | Status: AC
  Administered 2014-08-30: 10:00:00 via INTRAVENOUS

## 2014-08-30 MED ORDER — ONDANSETRON 16 MG/50ML IVPB (CHCC)
16.0000 mg | Freq: Once | INTRAVENOUS | Status: AC
  Administered 2014-08-30: 16 mg via INTRAVENOUS

## 2014-08-30 MED ORDER — DEXAMETHASONE SODIUM PHOSPHATE 20 MG/5ML IJ SOLN
20.0000 mg | Freq: Once | INTRAMUSCULAR | Status: AC
  Administered 2014-08-30: 20 mg via INTRAVENOUS

## 2014-08-30 NOTE — Telephone Encounter (Signed)
Called pt. Pt's "voicemail box has not yet been set up". WCB.

## 2014-08-30 NOTE — Patient Instructions (Signed)
[facility] Discharge Instructions for Patients Receiving Chemotherapy  Today you received the following chemotherapy agents;  Carboplatin and Alimta.   To help prevent nausea and vomiting after your treatment, we encourage you to take your nausea medication as directed.    If you develop nausea and vomiting that is not controlled by your nausea medication, call the clinic.   BELOW ARE SYMPTOMS THAT SHOULD BE REPORTED IMMEDIATELY:  *FEVER GREATER THAN 100.5 F  *CHILLS WITH OR WITHOUT FEVER  NAUSEA AND VOMITING THAT IS NOT CONTROLLED WITH YOUR NAUSEA MEDICATION  *UNUSUAL SHORTNESS OF BREATH  *UNUSUAL BRUISING OR BLEEDING  TENDERNESS IN MOUTH AND THROAT WITH OR WITHOUT PRESENCE OF ULCERS  *URINARY PROBLEMS  *BOWEL PROBLEMS  UNUSUAL RASH Items with * indicate a potential emergency and should be followed up as soon as possible.  Feel free to call the clinic you have any questions or concerns. The clinic phone number is [phone].

## 2014-08-31 ENCOUNTER — Telehealth: Payer: Self-pay | Admitting: Certified Registered Nurse Anesthetist

## 2014-08-31 NOTE — Telephone Encounter (Signed)
Spoke with the pt  She is requesting that her bx results be faxed to her Mother's address [address] [place] [address]  This has been done and nothing further needed per pt

## 2014-08-31 NOTE — Telephone Encounter (Signed)
-----   Message from Ronnette Juniper, RN sent at 08/30/2014 11:25 AM EST ----- Regarding: 1st treatment 1st treatment: Alimta/Carboplatin: Dr Julien Nordmann.  [phone] Jerilynn Mages)

## 2014-08-31 NOTE — Telephone Encounter (Signed)
Called patient after chemo yesterday. She is doing well. Able to eat and drink without any nausea or vomiting.  Antiemetic use reviewed. C/o'ed of mild constipation. Bowel regimen reviewed re:use of OTC bowel med for constipation. Pt. advised to check with pharmacist and increase oral fluid intake.  No further questions and aware to call [phone] if she has further questions or issues.

## 2014-09-05 ENCOUNTER — Encounter: Payer: Self-pay | Admitting: Internal Medicine

## 2014-09-05 ENCOUNTER — Other Ambulatory Visit (HOSPITAL_BASED_OUTPATIENT_CLINIC_OR_DEPARTMENT_OTHER): Payer: Self-pay

## 2014-09-05 ENCOUNTER — Telehealth: Payer: Self-pay | Admitting: Internal Medicine

## 2014-09-05 ENCOUNTER — Ambulatory Visit (HOSPITAL_BASED_OUTPATIENT_CLINIC_OR_DEPARTMENT_OTHER): Payer: Self-pay | Admitting: Internal Medicine

## 2014-09-05 ENCOUNTER — Other Ambulatory Visit: Payer: Self-pay | Admitting: Radiology

## 2014-09-05 ENCOUNTER — Other Ambulatory Visit: Payer: Self-pay

## 2014-09-05 VITALS — BP 127/90 | HR 85 | Temp 98.4°F | Resp 18 | Ht 65.0 in | Wt 161.2 lb

## 2014-09-05 DIAGNOSIS — C3412 Malignant neoplasm of upper lobe, left bronchus or lung: Secondary | ICD-10-CM

## 2014-09-05 DIAGNOSIS — C3490 Malignant neoplasm of unspecified part of unspecified bronchus or lung: Secondary | ICD-10-CM

## 2014-09-05 DIAGNOSIS — K219 Gastro-esophageal reflux disease without esophagitis: Secondary | ICD-10-CM

## 2014-09-05 DIAGNOSIS — C3411 Malignant neoplasm of upper lobe, right bronchus or lung: Secondary | ICD-10-CM

## 2014-09-05 DIAGNOSIS — F32A Depression, unspecified: Secondary | ICD-10-CM

## 2014-09-05 DIAGNOSIS — F329 Major depressive disorder, single episode, unspecified: Secondary | ICD-10-CM

## 2014-09-05 LAB — CBC WITH DIFFERENTIAL/PLATELET
BASO%: 0.2 % (ref 0.0–2.0)
Basophils Absolute: 0 10*3/uL (ref 0.0–0.1)
EOS%: 4.9 % (ref 0.0–7.0)
Eosinophils Absolute: 0.2 10*3/uL (ref 0.0–0.5)
HEMATOCRIT: 39.9 % (ref 34.8–46.6)
HGB: 13.3 g/dL (ref 11.6–15.9)
LYMPH#: 1.5 10*3/uL (ref 0.9–3.3)
LYMPH%: 32.7 % (ref 14.0–49.7)
MCH: 31.1 pg (ref 25.1–34.0)
MCHC: 33.3 g/dL (ref 31.5–36.0)
MCV: 93.2 fL (ref 79.5–101.0)
MONO#: 0.2 10*3/uL (ref 0.1–0.9)
MONO%: 3.6 % (ref 0.0–14.0)
NEUT#: 2.6 10*3/uL (ref 1.5–6.5)
NEUT%: 58.6 % (ref 38.4–76.8)
Platelets: 212 10*3/uL (ref 145–400)
RBC: 4.28 10*6/uL (ref 3.70–5.45)
RDW: 13.6 % (ref 11.2–14.5)
WBC: 4.5 10*3/uL (ref 3.9–10.3)

## 2014-09-05 LAB — COMPREHENSIVE METABOLIC PANEL (CC13)
ALK PHOS: 111 U/L (ref 40–150)
ALT: 17 U/L (ref 0–55)
ANION GAP: 8 meq/L (ref 3–11)
AST: 15 U/L (ref 5–34)
Albumin: 3.7 g/dL (ref 3.5–5.0)
BILIRUBIN TOTAL: 0.4 mg/dL (ref 0.20–1.20)
BUN: 8.4 mg/dL (ref 7.0–26.0)
CO2: 27 meq/L (ref 22–29)
CREATININE: 0.7 mg/dL (ref 0.6–1.1)
Calcium: 9 mg/dL (ref 8.4–10.4)
Chloride: 102 mEq/L (ref 98–109)
GLUCOSE: 102 mg/dL (ref 70–140)
Potassium: 4.1 mEq/L (ref 3.5–5.1)
Sodium: 137 mEq/L (ref 136–145)
Total Protein: 6.9 g/dL (ref 6.4–8.3)

## 2014-09-05 MED ORDER — MIRTAZAPINE 30 MG PO TABS: 30.0000 mg | ORAL_TABLET | Freq: Every day | ORAL | Status: DC

## 2014-09-05 MED ORDER — OMEPRAZOLE 20 MG PO CPDR: 20.0000 mg | DELAYED_RELEASE_CAPSULE | Freq: Two times a day (BID) | ORAL | Status: DC

## 2014-09-05 NOTE — Telephone Encounter (Signed)
no vm set up.....mailed pt appt sched and avs with letter

## 2014-09-05 NOTE — Progress Notes (Signed)
[place] [phone]   [phone]  OFFICE PROGRESS NOTE  No PCP Per Patient No address on file  DIAGNOSIS: stage IV (T2a, N0, M1a) non-small cell lung cancer, adenocarcinoma presented with bilateral pulmonary lesions. This could be also consider as synchronous primary tumors.  PRIOR THERAPY: None.  CURRENT THERAPY: Systemic chemotherapy with carboplatin for AUC of 5 and Alimta 500 MG/M2 every 3 weeks.  INTERVAL HISTORY: Heather Banks 64 y.o. female returns to the clinic today for follow-up visit. The patient tolerated the first week of her treatment fairly well with no significant adverse effects. She is fairly emotional today and complaining about acid reflux as well as migraine headache. She denied having any significant nausea or vomiting, she has no fever or chills. The patient denied having any significant chest pain, shortness breath, cough or hemoptysis. She has no significant weight loss or night sweats.  MEDICAL HISTORY: Past Medical History  Diagnosis Date  . COPD (chronic obstructive pulmonary disease)     Albuterol neb as needed;Pulmicort neb daily  . GERD (gastroesophageal reflux disease)     takes Pantoprazole and Zantac daily  . Lung mass   . Pneumonia     hx of-last time about 4+yrs ago  . History of bronchitis     >5 yrs ago  . History of migraine     last one 2 wks ago  . Panic attacks     but doesn't take any meds    ALLERGIES:  has No Known Allergies.  MEDICATIONS:  Current Outpatient Prescriptions  Medication Sig Dispense Refill  . albuterol (PROVENTIL) (2.5 MG/3ML) 0.083% nebulizer solution Take 3 mLs (2.5 mg total) by nebulization every 6 (six) hours as needed for wheezing or shortness of breath. 75 mL 12  . ALPRAZolam (XANAX) 0.5 MG tablet Take 1 tablet (0.5 mg total) by mouth at bedtime as needed for anxiety. (Patient taking differently: Take 0.5 mg by mouth at bedtime. ) 60 tablet 0  .  aspirin-acetaminophen-caffeine (EXCEDRIN MIGRAINE) 250-250-65 MG per tablet Take 2 tablets by mouth every 6 (six) hours as needed for headache.    . budesonide (PULMICORT) 0.25 MG/2ML nebulizer solution Take 0.25 mg by nebulization 2 (two) times daily.    Marland Kitchen dexamethasone (DECADRON) 4 MG tablet 4 mg by mouth twice a day the day before, day of and day after the chemotherapy every 3 weeks 40 tablet 1  . folic acid (FOLVITE) 1 MG tablet Take 1 tablet (1 mg total) by mouth daily. 30 tablet 4  . HYDROcodone-acetaminophen (NORCO/VICODIN) 5-325 MG per tablet Take 1 tablet by mouth every 6 (six) hours as needed for moderate pain or severe pain. 30 tablet 0  . lidocaine-prilocaine (EMLA) cream Apply 1 application topically as needed. (Patient taking differently: Apply 1 application topically as needed (For port-a-cath.). ) 30 g 1  . Melatonin 10 MG TABS Take 10 mg by mouth at bedtime as needed (For sleep.).     Marland Kitchen Multiple Vitamins-Minerals (MULTIVITAMIN WITH MINERALS) tablet Take 1 tablet by mouth daily.    . nicotine (NICODERM CQ) 21 mg/24hr patch Place 1 patch (21 mg total) onto the skin daily. 28 patch 0  . PRESCRIPTION MEDICATION She receives her treatments at the [facility]. Her oncologist is Dr. Julien Nordmann. She received Alimta 952m and Carboplatin 5660mon 08/30/14.    . Marland Kitchenrochlorperazine (COMPAZINE) 10 MG tablet Take 1 tablet (10 mg total) by mouth every 6 (six) hours as needed  for nausea or vomiting. 60 tablet 0  . ranitidine (ZANTAC) 150 MG tablet Take 150 mg by mouth 2 (two) times daily.     No current facility-administered medications for this visit.    SURGICAL HISTORY:  Past Surgical History  Procedure Laterality Date  . Abdominal hysterectomy    . Oophorectomy    . Appendectomy    . Tonsillectomy    . Video bronchoscopy with endobronchial navigation N/A 08/14/2014    Procedure: VIDEO BRONCHOSCOPY WITH ENDOBRONCHIAL NAVIGATION;  Surgeon: Rigoberto Noel, MD;  Location: [facility]  OR;  Service: Thoracic;  Laterality: N/A;    REVIEW OF SYSTEMS:  A comprehensive review of systems was negative except for: Constitutional: positive for fatigue Gastrointestinal: positive for reflux symptoms Neurological: positive for headaches   PHYSICAL EXAMINATION: General appearance: alert, cooperative and no distress Head: Normocephalic, without obvious abnormality, atraumatic Neck: no adenopathy, no JVD, supple, symmetrical, trachea midline and thyroid not enlarged, symmetric, no tenderness/mass/nodules Lymph nodes: Cervical, supraclavicular, and axillary nodes normal. Resp: clear to auscultation bilaterally Back: symmetric, no curvature. ROM normal. No CVA tenderness. Cardio: regular rate and rhythm, S1, S2 normal, no murmur, click, rub or gallop GI: soft, non-tender; bowel sounds normal; no masses,  no organomegaly Extremities: extremities normal, atraumatic, no cyanosis or edema  ECOG PERFORMANCE STATUS: 1 - Symptomatic but completely ambulatory  Blood pressure 127/90, pulse 85, temperature 98.4 F (36.9 C), temperature source Oral, resp. rate 18, height 5' 5"  (1.651 m), weight 161 lb 3.2 oz (73.12 kg).  LABORATORY DATA: Lab Results  Component Value Date   WBC 4.5 09/05/2014   HGB 13.3 09/05/2014   HCT 39.9 09/05/2014   MCV 93.2 09/05/2014   PLT 212 09/05/2014      Chemistry      Component Value Date/Time   NA 137 09/05/2014 0905   NA 138 08/11/2014 0837   K 4.1 09/05/2014 0905   K 3.8 08/11/2014 0837   CL 103 08/11/2014 0837   CO2 27 09/05/2014 0905   CO2 23 08/11/2014 0837   BUN 8.4 09/05/2014 0905   BUN 8 08/11/2014 0837   CREATININE 0.7 09/05/2014 0905   CREATININE 0.57 08/11/2014 0837      Component Value Date/Time   CALCIUM 9.0 09/05/2014 0905   CALCIUM 9.0 08/11/2014 0837   ALKPHOS 111 09/05/2014 0905   ALKPHOS 145* 07/10/2014 0459   AST 15 09/05/2014 0905   AST 11 07/10/2014 0459   ALT 17 09/05/2014 0905   ALT 8 07/10/2014 0459   BILITOT 0.40  09/05/2014 0905   BILITOT <0.2* 07/10/2014 0459       RADIOGRAPHIC STUDIES: Dg Chest 1 View  08/25/2014   CLINICAL DATA:  Follow-up right apical pneumothorax identified after bronchoscopy approximately 2 weeks ago.  EXAM: PORTABLE CHEST - 1 VIEW  COMPARISON:  08/21/2014 dating back to 08/14/2014. PET-CT 07/31/2014.  FINDINGS: Interval near complete resolution of the right apical pneumothorax, with a very small (less than 5%) loculated pneumothorax persisting laterally at the right apex. Bilateral upper lobe lung mass is, left greater than right, as noted previously. No new pulmonary parenchymal abnormality. Cardiac silhouette normal in size, unchanged.  IMPRESSION: Interval near complete resolution of the right apical pneumothorax, with only a small (less than 5%) loculated pneumothorax persisting laterally at the right apex.   Electronically Signed   By: Evangeline Dakin M.D.   On: 08/25/2014 13:37   Dg Chest 2 View  08/21/2014   CLINICAL DATA:  History of COPD. Pneumonia and bronchitis.  Lung mass. Followup of pneumothorax.  EXAM: CHEST  2 VIEW  COMPARISON:  08/17/2014  FINDINGS: Hyperinflation. Midline trachea. Normal heart size. Atherosclerosis in the transverse aorta. No pleural fluid. Left apical and right upper lobe pulmonary lesions are again identified. Diffuse peribronchial thickening. Decrease right-sided pneumothorax. On the order of 5%. Visceral pleural line maximally 8 mm from the chest wall at the apex. 10 mm at the same level on the prior. Subpulmonic component is resolved.  IMPRESSION: Decrease in approximately 5% right apical pneumothorax.  Left apical and right upper lobe lung lesions, as before.   Electronically Signed   By: Abigail Miyamoto M.D.   On: 08/21/2014 13:52   Dg Chest 2 View  08/17/2014   CLINICAL DATA:  Subsequent encounter, follow-up right pneumothorax  EXAM: CHEST  2 VIEW  COMPARISON:  08/15/14, 08/14/2014  FINDINGS: The right pneumothorax has decreased slightly since  08/15/14. It now is approximately 10% volume, located in the lateral base and in the apex. Masses are again evident in both upper lobes. Hilar, mediastinal and cardiac contours appear unremarkable and unchanged.  IMPRESSION: Decreased size of the right pneumothorax, now approximately 10% volume.   Electronically Signed   By: Andreas Newport M.D.   On: 08/17/2014 09:45   Dg Chest 2 View  08/15/2014   CLINICAL DATA:  Status post bronchoscopy yesterday with biopsy of bilateral upper lobe masses.  EXAM: CHEST - 2 VIEW  COMPARISON:  08/14/2014  FINDINGS: Postprocedural pneumothorax on the right has enlarged since the prior study with apical, lateral and basilar components present. Overall volume of pneumothorax is approximately 15%. Mid tension physiology apparent by chest x-ray. Bilateral upper lobe mass is again visible. No pulmonary edema or pleural fluid.  IMPRESSION: Enlargement of right pneumothorax to roughly 15% volume.  These results will be called to the ordering clinician or representative by the Radiologist Assistant, and communication documented in the PACS or zVision Dashboard.   Electronically Signed   By: Aletta Edouard M.D.   On: 08/15/2014 15:02   Dg Chest 2 View  08/11/2014   CLINICAL DATA:  Pulmonary mass.  EXAM: CHEST  2 VIEW  COMPARISON:  PET scan 07/31/2014.  FINDINGS: Mediastinum and hilar structures are unremarkable. Again noted are bilateral upper lobe mass lesions. No new infiltrate. Heart size normal. No acute bony abnormality.  IMPRESSION: Bilateral upper lobe pulmonary mass lesions previously identified to the PET positive, these are suspicious for malignancy. Similar findings noted on prior PET-CT of 07/31/2014.   Electronically Signed   By: Marcello Moores  Register   On: 08/11/2014 10:39   Ir Fluoro Guide Cv Line Right  08/25/2014   INDICATION: History of lung cancer, in need of intravenous access for chemotherapy administration.  EXAM: IMPLANTED PORT A CATH PLACEMENT WITH ULTRASOUND  AND FLUOROSCOPIC GUIDANCE  COMPARISON:  PET-CT -07/31/2014  MEDICATIONS: Ancef 2 gm IV; The antibiotic was administered within an appropriate time interval prior to skin puncture.  ANESTHESIA/SEDATION: Versed 2.5 mg IV; Fentanyl 125 mcg IV;  Total Moderate Sedation Time  26  minutes.  CONTRAST:  None  FLUOROSCOPY TIME:  12 seconds (4 mGy)  COMPLICATIONS: None immediate  PROCEDURE: The procedure, risks, benefits, and alternatives were explained to the patient. Questions regarding the procedure were encouraged and answered. The patient understands and consents to the procedure.  The right neck and chest were prepped with chlorhexidine in a sterile fashion, and a sterile drape was applied covering the operative field. Maximum barrier sterile technique with sterile gowns and  gloves were used for the procedure. A timeout was performed prior to the initiation of the procedure. Local anesthesia was provided with 1% lidocaine with epinephrine.  After creating a small venotomy incision, a micropuncture kit was utilized to access the internal jugular vein under direct, real-time ultrasound guidance. Ultrasound image documentation was performed. The microwire was kinked to measure appropriate catheter length.  A subcutaneous port pocket was then created along the upper chest wall utilizing a combination of sharp and blunt dissection. The pocket was irrigated with sterile saline. A single lumen ISP power injectable port was chosen for placement. The 8 Fr catheter was tunneled from the port pocket site to the venotomy incision. The port was placed in the pocket. The external catheter was trimmed to appropriate length. At the venotomy, an 8 Fr peel-away sheath was placed over a guidewire under fluoroscopic guidance. The catheter was then placed through the sheath and the sheath was removed. Final catheter positioning was confirmed and documented with a fluoroscopic spot radiograph. The port was accessed with a Huber needle,  aspirated and flushed with heparinized saline.  The venotomy site was closed with an interrupted 4-0 Vicryl suture. The port pocket incision was closed with interrupted 2-0 Vicryl suture and the skin was opposed with a running subcuticular 4-0 Vicryl suture. Dermabond and Steri-strips were applied to both incisions. Dressings were placed. The patient tolerated the procedure well without immediate post procedural complication.  FINDINGS: After catheter placement, the tip lies within the superior cavoatrial junction. The catheter aspirates and flushes normally and is ready for immediate use.  IMPRESSION: Successful placement of a right internal jugular approach power injectable Port-A-Cath. The catheter is ready for immediate use.   Electronically Signed   By: Sandi Mariscal M.D.   On: 08/25/2014 15:35   Ir US Guide Vasc Access Right  08/25/2014   INDICATION: History of lung cancer, in need of intravenous access for chemotherapy administration.  EXAM: IMPLANTED PORT A CATH PLACEMENT WITH ULTRASOUND AND FLUOROSCOPIC GUIDANCE  COMPARISON:  PET-CT -07/31/2014  MEDICATIONS: Ancef 2 gm IV; The antibiotic was administered within an appropriate time interval prior to skin puncture.  ANESTHESIA/SEDATION: Versed 2.5 mg IV; Fentanyl 125 mcg IV;  Total Moderate Sedation Time  26  minutes.  CONTRAST:  None  FLUOROSCOPY TIME:  12 seconds (4 mGy)  COMPLICATIONS: None immediate  PROCEDURE: The procedure, risks, benefits, and alternatives were explained to the patient. Questions regarding the procedure were encouraged and answered. The patient understands and consents to the procedure.  The right neck and chest were prepped with chlorhexidine in a sterile fashion, and a sterile drape was applied covering the operative field. Maximum barrier sterile technique with sterile gowns and gloves were used for the procedure. A timeout was performed prior to the initiation of the procedure. Local anesthesia was provided with 1% lidocaine with  epinephrine.  After creating a small venotomy incision, a micropuncture kit was utilized to access the internal jugular vein under direct, real-time ultrasound guidance. Ultrasound image documentation was performed. The microwire was kinked to measure appropriate catheter length.  A subcutaneous port pocket was then created along the upper chest wall utilizing a combination of sharp and blunt dissection. The pocket was irrigated with sterile saline. A single lumen ISP power injectable port was chosen for placement. The 8 Fr catheter was tunneled from the port pocket site to the venotomy incision. The port was placed in the pocket. The external catheter was trimmed to appropriate length. At the venotomy, an  8 Fr peel-away sheath was placed over a guidewire under fluoroscopic guidance. The catheter was then placed through the sheath and the sheath was removed. Final catheter positioning was confirmed and documented with a fluoroscopic spot radiograph. The port was accessed with a Huber needle, aspirated and flushed with heparinized saline.  The venotomy site was closed with an interrupted 4-0 Vicryl suture. The port pocket incision was closed with interrupted 2-0 Vicryl suture and the skin was opposed with a running subcuticular 4-0 Vicryl suture. Dermabond and Steri-strips were applied to both incisions. Dressings were placed. The patient tolerated the procedure well without immediate post procedural complication.  FINDINGS: After catheter placement, the tip lies within the superior cavoatrial junction. The catheter aspirates and flushes normally and is ready for immediate use.  IMPRESSION: Successful placement of a right internal jugular approach power injectable Port-A-Cath. The catheter is ready for immediate use.   Electronically Signed   By: Sandi Mariscal M.D.   On: 08/25/2014 15:35   Dg Chest Port 1 View  08/14/2014   CLINICAL DATA:  Post bronchoscopy and biopsy  EXAM: PORTABLE CHEST - 1 VIEW  COMPARISON:   08/11/2014  FINDINGS: Cardiomediastinal silhouette is stable. Nodular mass again noted in left upper lobe measures 4.4 cm. Nodular mass in right upper lobe measures 1.7 cm. Tiny amount of hemorrhage or infiltrate surrounding the right upper lobe nodule. Small right upper pneumothorax about 5%.  IMPRESSION: Nodular mass again noted in left upper lobe measures 4.4 cm. Nodular mass in right upper lobe measures 1.7 cm. Tiny amount of hemorrhage or infiltrate surrounding the right upper lobe nodule. Small right upper pneumothorax about 5%.   Electronically Signed   By: Lahoma Crocker M.D.   On: 08/14/2014 13:51   Dg C-arm Bronchoscopy  08/14/2014   CLINICAL DATA:    C-ARM BRONCHOSCOPY  Fluoroscopy was utilized by the requesting physician.  No radiographic  interpretation.     ASSESSMENT AND PLAN: This is a very pleasant 64 years old white female with stage IV non-small cell lung cancer currently undergoing systemic chemotherapy with carboplatin and Alimta status post 1 cycle. The patient tolerated the first week of her treatment fairly well. I recommended for her to proceed with cycle #2 in 2 weeks as a scheduled For depression, I will start the patient on Remeron 30 mg by mouth daily at bedtime. For the acid reflux, I will start the patient on Prilosec 20 mg by mouth twice a day. She will come back for follow-up visit in 2 weeks with the next cycle of her treatment. She was advised to call immediately if she has any concerning symptoms in the interval. The patient voices understanding of current disease status and treatment options and is in agreement with the current care plan.  All questions were answered. The patient knows to call the clinic with any problems, questions or concerns. We can certainly see the patient much sooner if necessary.  Disclaimer: This note was dictated with voice recognition software. Similar sounding words can inadvertently be transcribed and may not be corrected upon  review.

## 2014-09-05 NOTE — Patient Instructions (Signed)
Smoking Cessation, Tips for Success  If you are ready to quit smoking, congratulations! You have chosen to help yourself be healthier. Cigarettes bring nicotine, tar, carbon monoxide, and other irritants into your body. Your lungs, heart, and blood vessels will be able to work better without these poisons. There are many different ways to quit smoking. Nicotine gum, nicotine patches, a nicotine inhaler, or nicotine nasal spray can help with physical craving. Hypnosis, support groups, and medicines help break the habit of smoking.  WHAT THINGS CAN I DO TO MAKE QUITTING EASIER?   Here are some tips to help you quit for good:  · Pick a date when you will quit smoking completely. Tell all of your friends and family about your plan to quit on that date.  · Do not try to slowly cut down on the number of cigarettes you are smoking. Pick a quit date and quit smoking completely starting on that day.  · Throw away all cigarettes.    · Clean and remove all ashtrays from your home, work, and car.  · On a card, write down your reasons for quitting. Carry the card with you and read it when you get the urge to smoke.  · Cleanse your body of nicotine. Drink enough water and fluids to keep your urine clear or pale yellow. Do this after quitting to flush the nicotine from your body.  · Learn to predict your moods. Do not let a bad situation be your excuse to have a cigarette. Some situations in your life might tempt you into wanting a cigarette.  · Never have "just one" cigarette. It leads to wanting another and another. Remind yourself of your decision to quit.  · Change habits associated with smoking. If you smoked while driving or when feeling stressed, try other activities to replace smoking. Stand up when drinking your coffee. Brush your teeth after eating. Sit in a different chair when you read the paper. Avoid alcohol while trying to quit, and try to drink fewer caffeinated beverages. Alcohol and caffeine may urge you to  smoke.  · Avoid foods and drinks that can trigger a desire to smoke, such as sugary or spicy foods and alcohol.  · Ask people who smoke not to smoke around you.  · Have something planned to do right after eating or having a cup of coffee. For example, plan to take a walk or exercise.  · Try a relaxation exercise to calm you down and decrease your stress. Remember, you may be tense and nervous for the first 2 weeks after you quit, but this will pass.  · Find new activities to keep your hands busy. Play with a pen, coin, or rubber band. Doodle or draw things on paper.  · Brush your teeth right after eating. This will help cut down on the craving for the taste of tobacco after meals. You can also try mouthwash.    · Use oral substitutes in place of cigarettes. Try using lemon drops, carrots, cinnamon sticks, or chewing gum. Keep them handy so they are available when you have the urge to smoke.  · When you have the urge to smoke, try deep breathing.  · Designate your home as a nonsmoking area.  · If you are a heavy smoker, ask your health care provider about a prescription for nicotine chewing gum. It can ease your withdrawal from nicotine.  · Reward yourself. Set aside the cigarette money you save and buy yourself something nice.  · Look for   support from others. Join a support group or smoking cessation program. Ask someone at home or at work to help you with your plan to quit smoking.  · Always ask yourself, "Do I need this cigarette or is this just a reflex?" Tell yourself, "Today, I choose not to smoke," or "I do not want to smoke." You are reminding yourself of your decision to quit.  · Do not replace cigarette smoking with electronic cigarettes (commonly called e-cigarettes). The safety of e-cigarettes is unknown, and some may contain harmful chemicals.  · If you relapse, do not give up! Plan ahead and think about what you will do the next time you get the urge to smoke.  HOW WILL I FEEL WHEN I QUIT SMOKING?  You  may have symptoms of withdrawal because your body is used to nicotine (the addictive substance in cigarettes). You may crave cigarettes, be irritable, feel very hungry, cough often, get headaches, or have difficulty concentrating. The withdrawal symptoms are only temporary. They are strongest when you first quit but will go away within 10-14 days. When withdrawal symptoms occur, stay in control. Think about your reasons for quitting. Remind yourself that these are signs that your body is healing and getting used to being without cigarettes. Remember that withdrawal symptoms are easier to treat than the major diseases that smoking can cause.   Even after the withdrawal is over, expect periodic urges to smoke. However, these cravings are generally short lived and will go away whether you smoke or not. Do not smoke!  WHAT RESOURCES ARE AVAILABLE TO HELP ME QUIT SMOKING?  Your health care provider can direct you to community resources or hospitals for support, which may include:  · Group support.  · Education.  · Hypnosis.  · Therapy.  Document Released: 04/11/2004 Document Revised: 11/28/2013 Document Reviewed: 12/30/2012  [employer]® Patient Information ©2015 [employer]. This information is not intended to replace advice given to you by your health care provider. Make sure you discuss any questions you have with your health care provider.

## 2014-09-06 ENCOUNTER — Other Ambulatory Visit: Payer: Self-pay | Admitting: Radiology

## 2014-09-07 ENCOUNTER — Ambulatory Visit (HOSPITAL_COMMUNITY)
Admission: RE | Admit: 2014-09-07 | Discharge: 2014-09-07 | Disposition: A | Discharge status: Home / Self Care | Payer: Medicaid - Out of State | Source: Ambulatory Visit | Attending: Internal Medicine | Admitting: Internal Medicine

## 2014-09-07 ENCOUNTER — Ambulatory Visit (HOSPITAL_COMMUNITY)
Admission: RE | Admit: 2014-09-07 | Discharge: 2014-09-07 | Disposition: A | Discharge status: Home / Self Care | Payer: Medicaid - Out of State | Source: Ambulatory Visit | Attending: Interventional Radiology | Admitting: Interventional Radiology

## 2014-09-07 ENCOUNTER — Encounter (HOSPITAL_COMMUNITY): Payer: Self-pay

## 2014-09-07 DIAGNOSIS — C3490 Malignant neoplasm of unspecified part of unspecified bronchus or lung: Secondary | ICD-10-CM | POA: Diagnosis present

## 2014-09-07 DIAGNOSIS — J95811 Postprocedural pneumothorax: Secondary | ICD-10-CM | POA: Insufficient documentation

## 2014-09-07 LAB — CBC WITH DIFFERENTIAL/PLATELET
Basophils Absolute: 0 10*3/uL (ref 0.0–0.1)
Basophils Relative: 0 % (ref 0–1)
Eosinophils Absolute: 0.2 10*3/uL (ref 0.0–0.7)
Eosinophils Relative: 5 % (ref 0–5)
HCT: 38.7 % (ref 36.0–46.0)
Hemoglobin: 12.7 g/dL (ref 12.0–15.0)
LYMPHS ABS: 2.1 10*3/uL (ref 0.7–4.0)
Lymphocytes Relative: 49 % — ABNORMAL HIGH (ref 12–46)
MCH: 30.6 pg (ref 26.0–34.0)
MCHC: 32.8 g/dL (ref 30.0–36.0)
MCV: 93.3 fL (ref 78.0–100.0)
MONO ABS: 0.3 10*3/uL (ref 0.1–1.0)
Monocytes Relative: 8 % (ref 3–12)
NEUTROS PCT: 38 % — AB (ref 43–77)
Neutro Abs: 1.6 10*3/uL — ABNORMAL LOW (ref 1.7–7.7)
PLATELETS: 198 10*3/uL (ref 150–400)
RBC: 4.15 MIL/uL (ref 3.87–5.11)
RDW: 13.6 % (ref 11.5–15.5)
WBC: 4.3 10*3/uL (ref 4.0–10.5)

## 2014-09-07 LAB — PROTIME-INR
INR: 0.95 (ref 0.00–1.49)
Prothrombin Time: 12.8 seconds (ref 11.6–15.2)

## 2014-09-07 LAB — APTT: aPTT: 34 seconds (ref 24–37)

## 2014-09-07 MED ORDER — MIDAZOLAM HCL 2 MG/2ML IJ SOLN
INTRAMUSCULAR | Status: AC
  Filled 2014-09-07: qty 6

## 2014-09-07 MED ORDER — FENTANYL CITRATE 0.05 MG/ML IJ SOLN
INTRAMUSCULAR | Status: AC | PRN
  Administered 2014-09-07 (×3): 50 ug via INTRAVENOUS

## 2014-09-07 MED ORDER — MIDAZOLAM HCL 2 MG/2ML IJ SOLN
INTRAMUSCULAR | Status: AC | PRN
  Administered 2014-09-07: 1 mg via INTRAVENOUS
  Administered 2014-09-07: 1.5 mg via INTRAVENOUS
  Administered 2014-09-07: 0.5 mg via INTRAVENOUS

## 2014-09-07 MED ORDER — HEPARIN SOD (PORK) LOCK FLUSH 100 UNIT/ML IV SOLN
500.0000 [IU] | INTRAVENOUS | Status: AC | PRN
  Administered 2014-09-07: 500 [IU]
  Filled 2014-09-07: qty 5

## 2014-09-07 MED ORDER — HYDROCODONE-ACETAMINOPHEN 5-325 MG PO TABS
1.0000 | ORAL_TABLET | ORAL | Status: DC | PRN
  Filled 2014-09-07: qty 2

## 2014-09-07 MED ORDER — SODIUM CHLORIDE 0.9 % IV SOLN
INTRAVENOUS | Status: DC
  Administered 2014-09-07: 10:00:00 via INTRAVENOUS

## 2014-09-07 MED ORDER — FENTANYL CITRATE 0.05 MG/ML IJ SOLN
INTRAMUSCULAR | Status: AC
  Filled 2014-09-07: qty 4

## 2014-09-07 NOTE — Discharge Instructions (Signed)
Needle Biopsy of Lung, Care After Refer to this sheet in the next few weeks. These instructions provide you with information on caring for yourself after your procedure. Your health care provider may also give you more specific instructions. Your treatment has been planned according to current medical practices, but problems sometimes occur. Call your health care provider if you have any problems or questions after your procedure. WHAT TO EXPECT AFTER THE PROCEDURE  A bandage will be applied over the area where the needle was inserted. You may be asked to apply pressure to the bandage for several minutes to ensure there is minimal bleeding.  In most cases, you can leave when your needle biopsy procedure is completed. Do not drive yourself home. Someone else should take you home.  If you received an IV sedative or general anesthetic, you will be taken to a comfortable place to relax while the medicine wears off.  If you have upcoming travel scheduled, talk to your health care provider about when it is safe to travel by air after the procedure. HOME CARE INSTRUCTIONS  Expect to take it easy for the rest of the day.  Protect the area where you received the needle biopsy by keeping the bandage in place for as long as instructed.  You may feel some mild pain or discomfort in the area, but this should stop in a day or two.  Take medicines only as directed by your health care provider. SEEK MEDICAL CARE IF:   You have pain at the biopsy site that worsens or is not helped by medicine.  You have swelling or drainage at the needle biopsy site.  You have a fever. SEEK IMMEDIATE MEDICAL CARE IF:   You have new or worsening shortness of breath.  You have chest pain.  You are coughing up blood.  You have bleeding that does not stop with pressure or a bandage.  You develop light-headedness or fainting. Document Released: 05/11/2007 Document Revised: 11/28/2013 Document Reviewed:  12/06/2012 [facility] Patient Information 2015 [place], [place]. This information is not intended to replace advice given to you by your health care provider. Make sure you discuss any questions you have with your health care provider. Conscious Sedation Sedation is the use of medicines to promote relaxation and relieve discomfort and anxiety. Conscious sedation is a type of sedation. Under conscious sedation you are less alert than normal but are still able to respond to instructions or stimulation. Conscious sedation is used during short medical and dental procedures. It is milder than deep sedation or general anesthesia and allows you to return to your regular activities sooner.  LET [facility] CARE PROVIDER KNOW ABOUT:   Any allergies you have.  All medicines you are taking, including vitamins, herbs, eye drops, creams, and over-the-counter medicines.  Use of steroids (by mouth or creams).  Previous problems you or members of your family have had with the use of anesthetics.  Any blood disorders you have.  Previous surgeries you have had.  Medical conditions you have.  Possibility of pregnancy, if this applies.  Use of cigarettes, alcohol, or illegal drugs. RISKS AND COMPLICATIONS Generally, this is a safe procedure. However, as with any procedure, problems can occur. Possible problems include:  Oversedation.  Trouble breathing on your own. You may need to have a breathing tube until you are awake and breathing on your own.  Allergic reaction to any of the medicines used for the procedure. BEFORE THE PROCEDURE  You may have blood tests done.  These tests can help show how well your kidneys and liver are working. They can also show how well your blood clots.  A physical exam will be done.  Only take medicines as directed by your health care provider. You may need to stop taking medicines (such as blood thinners, aspirin, or nonsteroidal anti-inflammatory drugs) before the  procedure.   Do not eat or drink at least 6 hours before the procedure or as directed by your health care provider.  Arrange for a responsible adult, family member, or friend to take you home after the procedure. He or she should stay with you for at least 24 hours after the procedure, until the medicine has worn off. PROCEDURE   An intravenous (IV) catheter will be inserted into one of your veins. Medicine will be able to flow directly into your body through this catheter. You may be given medicine through this tube to help prevent pain and help you relax.  The medical or dental procedure will be done. AFTER THE PROCEDURE  You will stay in a recovery area until the medicine has worn off. Your blood pressure and pulse will be checked.   Depending on the procedure you had, you may be allowed to go home when you can tolerate liquids and your pain is under control. Document Released: 04/08/2001 Document Revised: 07/19/2013 Document Reviewed: 03/21/2013 [facility] Patient Information 2015 [place], [place]. This information is not intended to replace advice given to you by your health care provider. Make sure you discuss any questions you have with your health care provider.

## 2014-09-07 NOTE — Progress Notes (Signed)
Lt. Upper lobe lung biopsy per Dr. Vernard Gambles, Dr. Earlie Server and Dr. Vernard Gambles spoke via phone to confirm Lt. Upper lobe Bx

## 2014-09-07 NOTE — H&P (Signed)
Chief Complaint: "I'm having a lung biopsy"  Referring Physician(s): Mohamed,Mohamed  History of Present Illness: Heather Banks is a 64 y.o. 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 She is s/p bronchoscopy with bx on 08/14/2014. F/u CXR revealed small right apical pneumothorax. She underwent uncomplicated right IJ  port a cath placement on 08/25/2014 and has had 1 cycle of chemotherapy. She presents today for CT guided left upper lobe mass biopsy for molecular testing.    Past Medical History  Diagnosis Date  . COPD (chronic obstructive pulmonary disease)     Albuterol neb as needed;Pulmicort neb daily  . GERD (gastroesophageal reflux disease)     takes Pantoprazole and Zantac daily  . Lung mass   . Pneumonia     hx of-last time about 4+yrs ago  . History of bronchitis     >5 yrs ago  . History of migraine     last one 2 wks ago  . Panic attacks     but doesn't take any meds    Past Surgical History  Procedure Laterality Date  . Abdominal hysterectomy    . Oophorectomy    . Appendectomy    . Tonsillectomy    . Video bronchoscopy with endobronchial navigation N/A 08/14/2014    Procedure: VIDEO BRONCHOSCOPY WITH ENDOBRONCHIAL NAVIGATION;  Surgeon: Rigoberto Noel, MD;  Location: [facility] OR;  Service: Thoracic;  Laterality: N/A;    Allergies: Review of patient's allergies indicates no known allergies.  Medications: Prior to Admission medications   Medication Sig Start Date End Date Taking? Authorizing Provider  ALPRAZolam Duanne Moron) 0.5 MG tablet Take 1 tablet (0.5 mg total) by mouth at bedtime as needed for anxiety. Patient taking differently: Take 0.5 mg by mouth at bedtime.  08/21/14  Yes Rigoberto Noel, MD  aspirin-acetaminophen-caffeine (EXCEDRIN MIGRAINE) [phone] MG per tablet Take 2 tablets by mouth every 6 (six) hours as needed for headache.   Yes Historical Provider, MD  budesonide  (PULMICORT) 0.25 MG/2ML nebulizer solution Take 0.25 mg by nebulization 2 (two) times daily.   Yes Historical Provider, MD  dexamethasone (DECADRON) 4 MG tablet 4 mg by mouth twice a day the day before, day of and day after the chemotherapy every 3 weeks 08/23/14  Yes Curt Bears, MD  folic acid (FOLVITE) 1 MG tablet Take 1 tablet (1 mg total) by mouth daily. 08/23/14  Yes Curt Bears, MD  HYDROcodone-acetaminophen (NORCO/VICODIN) 5-325 MG per tablet Take 1 tablet by mouth every 6 (six) hours as needed for moderate pain or severe pain. 08/15/14  Yes Deneise Lever, MD  lidocaine-prilocaine (EMLA) cream Apply 1 application topically as needed. Patient taking differently: Apply 1 application topically as needed (For port-a-cath.).  08/28/14  Yes Curt Bears, MD  Melatonin 10 MG TABS Take 10 mg by mouth at bedtime as needed (For sleep.).    Yes Historical Provider, MD  mirtazapine (REMERON) 30 MG tablet Take 1 tablet (30 mg total) by mouth at bedtime. 09/05/14  Yes Curt Bears, MD  Multiple Vitamins-Minerals (MULTIVITAMIN WITH MINERALS) tablet Take 1 tablet by mouth daily.   Yes Historical Provider, MD  nicotine (NICODERM CQ) 21 mg/24hr patch Place 1 patch (21 mg total) onto the skin daily. 08/15/14  Yes Deneise Lever, MD  PRESCRIPTION MEDICATION She receives her treatments at the [facility]. Her oncologist is Dr. Julien Nordmann. She received Alimta 938m and Carboplatin 5629mon 08/30/14.  Yes Historical Provider, MD  prochlorperazine (COMPAZINE) 10 MG tablet Take 1 tablet (10 mg total) by mouth every 6 (six) hours as needed for nausea or vomiting. 08/23/14  Yes Curt Bears, MD  ranitidine (ZANTAC) 150 MG tablet Take 150 mg by mouth 2 (two) times daily.   Yes Historical Provider, MD  albuterol (PROVENTIL) (2.5 MG/3ML) 0.083% nebulizer solution Take 3 mLs (2.5 mg total) by nebulization every 6 (six) hours as needed for wheezing or shortness of breath. 07/12/14   Reyne Dumas,  MD  omeprazole (PRILOSEC) 20 MG capsule Take 1 capsule (20 mg total) by mouth 2 (two) times daily before a meal. 09/05/14   Curt Bears, MD    Family History  Problem Relation Age of Onset  . COPD Father     History   Social History  . Marital Status: Widowed    Spouse Name: N/A  . Number of Children: N/A  . Years of Education: N/A   Social History Main Topics  . Smoking status: Current Some Day Smoker -- 0.00 packs/day for 44 years    Types: Cigarettes  . Smokeless tobacco: Not on file     Comment: 2 cigs a day  . Alcohol Use: No     Comment: no alcohol in 7 yrs   . Drug Use: Not on file  . Sexual Activity: Not on file   Other Topics Concern  . None   Social History Narrative      Review of Systems  Constitutional: Negative for fever and chills.  Respiratory: Negative for shortness of breath.        Occ cough  Cardiovascular: Negative for chest pain.  Gastrointestinal: Negative for nausea, vomiting, abdominal pain and blood in stool.  Genitourinary: Negative for dysuria and hematuria.  Musculoskeletal: Negative for back pain.  Neurological: Positive for headaches.  Psychiatric/Behavioral: The patient is nervous/anxious.     Vital Signs: BP 153/79 mmHg  Pulse 89  Temp(Src) 98.1 F (36.7 C) (Oral)  Resp 18  SpO2 99%  Physical Exam  Constitutional: She is oriented to person, place, and time. She appears well-developed and well-nourished.  Cardiovascular: Normal rate and regular rhythm.   Clean, intact rt IJ port a cath  Pulmonary/Chest:  Distant BS bilat  Abdominal: Soft. Bowel sounds are normal. There is no tenderness.  Musculoskeletal: Normal range of motion. She exhibits no edema.  Neurological: She is alert and oriented to person, place, and time.    Imaging: Dg Chest 1 View  08/25/2014   CLINICAL DATA:  Follow-up right apical pneumothorax identified after bronchoscopy approximately 2 weeks ago.  EXAM: PORTABLE CHEST - 1 VIEW  COMPARISON:   08/21/2014 dating back to 08/14/2014. PET-CT 07/31/2014.  FINDINGS: Interval near complete resolution of the right apical pneumothorax, with a very small (less than 5%) loculated pneumothorax persisting laterally at the right apex. Bilateral upper lobe lung mass is, left greater than right, as noted previously. No new pulmonary parenchymal abnormality. Cardiac silhouette normal in size, unchanged.  IMPRESSION: Interval near complete resolution of the right apical pneumothorax, with only a small (less than 5%) loculated pneumothorax persisting laterally at the right apex.   Electronically Signed   By: Evangeline Dakin M.D.   On: 08/25/2014 13:37   Dg Chest 2 View  08/21/2014   CLINICAL DATA:  History of COPD. Pneumonia and bronchitis. Lung mass. Followup of pneumothorax.  EXAM: CHEST  2 VIEW  COMPARISON:  08/17/2014  FINDINGS: Hyperinflation. Midline trachea. Normal heart size. Atherosclerosis in the  transverse aorta. No pleural fluid. Left apical and right upper lobe pulmonary lesions are again identified. Diffuse peribronchial thickening. Decrease right-sided pneumothorax. On the order of 5%. Visceral pleural line maximally 8 mm from the chest wall at the apex. 10 mm at the same level on the prior. Subpulmonic component is resolved.  IMPRESSION: Decrease in approximately 5% right apical pneumothorax.  Left apical and right upper lobe lung lesions, as before.   Electronically Signed   By: Abigail Miyamoto M.D.   On: 08/21/2014 13:52   Dg Chest 2 View  08/17/2014   CLINICAL DATA:  Subsequent encounter, follow-up right pneumothorax  EXAM: CHEST  2 VIEW  COMPARISON:  08/15/14, 08/14/2014  FINDINGS: The right pneumothorax has decreased slightly since 08/15/14. It now is approximately 10% volume, located in the lateral base and in the apex. Masses are again evident in both upper lobes. Hilar, mediastinal and cardiac contours appear unremarkable and unchanged.  IMPRESSION: Decreased size of the right pneumothorax, now  approximately 10% volume.   Electronically Signed   By: Andreas Newport M.D.   On: 08/17/2014 09:45   Dg Chest 2 View  08/15/2014   CLINICAL DATA:  Status post bronchoscopy yesterday with biopsy of bilateral upper lobe masses.  EXAM: CHEST - 2 VIEW  COMPARISON:  08/14/2014  FINDINGS: Postprocedural pneumothorax on the right has enlarged since the prior study with apical, lateral and basilar components present. Overall volume of pneumothorax is approximately 15%. Mid tension physiology apparent by chest x-ray. Bilateral upper lobe mass is again visible. No pulmonary edema or pleural fluid.  IMPRESSION: Enlargement of right pneumothorax to roughly 15% volume.  These results will be called to the ordering clinician or representative by the Radiologist Assistant, and communication documented in the PACS or zVision Dashboard.   Electronically Signed   By: Aletta Edouard M.D.   On: 08/15/2014 15:02   Dg Chest 2 View  08/11/2014   CLINICAL DATA:  Pulmonary mass.  EXAM: CHEST  2 VIEW  COMPARISON:  PET scan 07/31/2014.  FINDINGS: Mediastinum and hilar structures are unremarkable. Again noted are bilateral upper lobe mass lesions. No new infiltrate. Heart size normal. No acute bony abnormality.  IMPRESSION: Bilateral upper lobe pulmonary mass lesions previously identified to the PET positive, these are suspicious for malignancy. Similar findings noted on prior PET-CT of 07/31/2014.   Electronically Signed   By: Marcello Moores  Register   On: 08/11/2014 10:39   Ir Fluoro Guide Cv Line Right  08/25/2014   INDICATION: History of lung cancer, in need of intravenous access for chemotherapy administration.  EXAM: IMPLANTED PORT A CATH PLACEMENT WITH ULTRASOUND AND FLUOROSCOPIC GUIDANCE  COMPARISON:  PET-CT -07/31/2014  MEDICATIONS: Ancef 2 gm IV; The antibiotic was administered within an appropriate time interval prior to skin puncture.  ANESTHESIA/SEDATION: Versed 2.5 mg IV; Fentanyl 125 mcg IV;  Total Moderate Sedation Time   26  minutes.  CONTRAST:  None  FLUOROSCOPY TIME:  12 seconds (4 mGy)  COMPLICATIONS: None immediate  PROCEDURE: The procedure, risks, benefits, and alternatives were explained to the patient. Questions regarding the procedure were encouraged and answered. The patient understands and consents to the procedure.  The right neck and chest were prepped with chlorhexidine in a sterile fashion, and a sterile drape was applied covering the operative field. Maximum barrier sterile technique with sterile gowns and gloves were used for the procedure. A timeout was performed prior to the initiation of the procedure. Local anesthesia was provided with 1% lidocaine with epinephrine.  After creating a small venotomy incision, a micropuncture kit was utilized to access the internal jugular vein under direct, real-time ultrasound guidance. Ultrasound image documentation was performed. The microwire was kinked to measure appropriate catheter length.  A subcutaneous port pocket was then created along the upper chest wall utilizing a combination of sharp and blunt dissection. The pocket was irrigated with sterile saline. A single lumen ISP power injectable port was chosen for placement. The 8 Fr catheter was tunneled from the port pocket site to the venotomy incision. The port was placed in the pocket. The external catheter was trimmed to appropriate length. At the venotomy, an 8 Fr peel-away sheath was placed over a guidewire under fluoroscopic guidance. The catheter was then placed through the sheath and the sheath was removed. Final catheter positioning was confirmed and documented with a fluoroscopic spot radiograph. The port was accessed with a Huber needle, aspirated and flushed with heparinized saline.  The venotomy site was closed with an interrupted 4-0 Vicryl suture. The port pocket incision was closed with interrupted 2-0 Vicryl suture and the skin was opposed with a running subcuticular 4-0 Vicryl suture. Dermabond and  Steri-strips were applied to both incisions. Dressings were placed. The patient tolerated the procedure well without immediate post procedural complication.  FINDINGS: After catheter placement, the tip lies within the superior cavoatrial junction. The catheter aspirates and flushes normally and is ready for immediate use.  IMPRESSION: Successful placement of a right internal jugular approach power injectable Port-A-Cath. The catheter is ready for immediate use.   Electronically Signed   By: Sandi Mariscal M.D.   On: 08/25/2014 15:35   Ir US Guide Vasc Access Right  08/25/2014   INDICATION: History of lung cancer, in need of intravenous access for chemotherapy administration.  EXAM: IMPLANTED PORT A CATH PLACEMENT WITH ULTRASOUND AND FLUOROSCOPIC GUIDANCE  COMPARISON:  PET-CT -07/31/2014  MEDICATIONS: Ancef 2 gm IV; The antibiotic was administered within an appropriate time interval prior to skin puncture.  ANESTHESIA/SEDATION: Versed 2.5 mg IV; Fentanyl 125 mcg IV;  Total Moderate Sedation Time  26  minutes.  CONTRAST:  None  FLUOROSCOPY TIME:  12 seconds (4 mGy)  COMPLICATIONS: None immediate  PROCEDURE: The procedure, risks, benefits, and alternatives were explained to the patient. Questions regarding the procedure were encouraged and answered. The patient understands and consents to the procedure.  The right neck and chest were prepped with chlorhexidine in a sterile fashion, and a sterile drape was applied covering the operative field. Maximum barrier sterile technique with sterile gowns and gloves were used for the procedure. A timeout was performed prior to the initiation of the procedure. Local anesthesia was provided with 1% lidocaine with epinephrine.  After creating a small venotomy incision, a micropuncture kit was utilized to access the internal jugular vein under direct, real-time ultrasound guidance. Ultrasound image documentation was performed. The microwire was kinked to measure appropriate catheter  length.  A subcutaneous port pocket was then created along the upper chest wall utilizing a combination of sharp and blunt dissection. The pocket was irrigated with sterile saline. A single lumen ISP power injectable port was chosen for placement. The 8 Fr catheter was tunneled from the port pocket site to the venotomy incision. The port was placed in the pocket. The external catheter was trimmed to appropriate length. At the venotomy, an 8 Fr peel-away sheath was placed over a guidewire under fluoroscopic guidance. The catheter was then placed through the sheath and the sheath was removed. Final catheter  positioning was confirmed and documented with a fluoroscopic spot radiograph. The port was accessed with a Huber needle, aspirated and flushed with heparinized saline.  The venotomy site was closed with an interrupted 4-0 Vicryl suture. The port pocket incision was closed with interrupted 2-0 Vicryl suture and the skin was opposed with a running subcuticular 4-0 Vicryl suture. Dermabond and Steri-strips were applied to both incisions. Dressings were placed. The patient tolerated the procedure well without immediate post procedural complication.  FINDINGS: After catheter placement, the tip lies within the superior cavoatrial junction. The catheter aspirates and flushes normally and is ready for immediate use.  IMPRESSION: Successful placement of a right internal jugular approach power injectable Port-A-Cath. The catheter is ready for immediate use.   Electronically Signed   By: Sandi Mariscal M.D.   On: 08/25/2014 15:35   Dg Chest Port 1 View  08/14/2014   CLINICAL DATA:  Post bronchoscopy and biopsy  EXAM: PORTABLE CHEST - 1 VIEW  COMPARISON:  08/11/2014  FINDINGS: Cardiomediastinal silhouette is stable. Nodular mass again noted in left upper lobe measures 4.4 cm. Nodular mass in right upper lobe measures 1.7 cm. Tiny amount of hemorrhage or infiltrate surrounding the right upper lobe nodule. Small right upper  pneumothorax about 5%.  IMPRESSION: Nodular mass again noted in left upper lobe measures 4.4 cm. Nodular mass in right upper lobe measures 1.7 cm. Tiny amount of hemorrhage or infiltrate surrounding the right upper lobe nodule. Small right upper pneumothorax about 5%.   Electronically Signed   By: Lahoma Crocker M.D.   On: 08/14/2014 13:51   Dg C-arm Bronchoscopy  08/14/2014   CLINICAL DATA:    C-ARM BRONCHOSCOPY  Fluoroscopy was utilized by the requesting physician.  No radiographic  interpretation.     Labs:  CBC:  Recent Labs  08/25/14 1155 08/30/14 0844 09/05/14 0905 09/07/14 0925  WBC 8.8 12.0* 4.5 4.3  HGB 13.7 13.0 13.3 12.7  HCT 41.1 38.8 39.9 38.7  PLT 289 310 212 198    COAGS:  Recent Labs  07/09/14 2349 07/10/14 0459 08/25/14 1155 09/07/14 0925  INR 0.90  --  0.92 0.95  APTT  --  40*  --  34    BMP:  Recent Labs  07/09/14 1525 07/10/14 0459 08/11/14 0837 08/23/14 1119 08/30/14 0844 09/05/14 0905  NA 142 141 138 139 139 137  K 3.5* 3.3* 3.8 4.2 3.4* 4.1  CL 102 100 103  --   --   --   CO2 _0 19* 27  GLUCOSE 98 146* 98 99 147* 102  BUN 7 4* 8 8.3 9.5 8.4  CALCIUM 8.9 9.1 9.0 9.6 9.4 9.0  CREATININE 0.50 0.41* 0.57 0.7 0.7 0.7  GFRNONAA >90 >90 >90  --   --   --   GFRAA >90 >90 >90  --   --   --     LIVER FUNCTION TESTS:  Recent Labs  07/10/14 0459 08/23/14 1119 08/30/14 0844 09/05/14 0905  BILITOT <0.2* 0.41 0.21 0.40  AST _1 ALT _2 ALKPHOS 145* 138 116 111  PROT 7.0 7.6 7.3 6.9  ALBUMIN 3.1* 4.0 3.8 3.7    TUMOR MARKERS: No results for input(s): AFPTM, CEA, CA199, CHROMGRNA in the last 8760 hours.  Assessment and Plan: Heather Banks is a 64 y.o. 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 She is s/p bronchoscopy with  bx on 08/14/2014. F/u CXR revealed small right apical pneumothorax. She underwent uncomplicated right IJ  port a  cath placement on 08/25/2014 and has had 1 cycle of chemotherapy. She presents today for CT guided left upper lobe mass biopsy for molecular testing. Details/risks of procedure(including, but not limited to, internal bleeding/hemoptysis, pneumothorax, infection and death) d/w pt with her understanding and consent.    Signed: Autumn Messing 09/07/2014, 10:04 AM

## 2014-09-07 NOTE — Procedures (Signed)
CT LUL lung biopsy 18g core x3 to surg path No complication No blood loss. See complete dictation in [facility].

## 2014-09-12 ENCOUNTER — Other Ambulatory Visit (HOSPITAL_BASED_OUTPATIENT_CLINIC_OR_DEPARTMENT_OTHER): Payer: Self-pay

## 2014-09-12 ENCOUNTER — Ambulatory Visit: Payer: Self-pay | Admitting: Physician Assistant

## 2014-09-12 DIAGNOSIS — C3412 Malignant neoplasm of upper lobe, left bronchus or lung: Secondary | ICD-10-CM

## 2014-09-12 DIAGNOSIS — C3490 Malignant neoplasm of unspecified part of unspecified bronchus or lung: Secondary | ICD-10-CM

## 2014-09-12 DIAGNOSIS — C3411 Malignant neoplasm of upper lobe, right bronchus or lung: Secondary | ICD-10-CM

## 2014-09-12 LAB — COMPREHENSIVE METABOLIC PANEL (CC13)
ALT: 23 U/L (ref 0–55)
AST: 24 U/L (ref 5–34)
Albumin: 3.5 g/dL (ref 3.5–5.0)
Alkaline Phosphatase: 129 U/L (ref 40–150)
Anion Gap: 11 mEq/L (ref 3–11)
BUN: 7 mg/dL (ref 7.0–26.0)
CO2: 25 meq/L (ref 22–29)
Calcium: 9.2 mg/dL (ref 8.4–10.4)
Chloride: 106 mEq/L (ref 98–109)
Creatinine: 0.6 mg/dL (ref 0.6–1.1)
EGFR: >90 mL/min/{1.73_m2} (ref >90)
Glucose: 101 mg/dl (ref 70–140)
Potassium: 4.1 mEq/L (ref 3.5–5.1)
SODIUM: 142 meq/L (ref 136–145)
Total Protein: 6.8 g/dL (ref 6.4–8.3)

## 2014-09-12 LAB — CBC WITH DIFFERENTIAL/PLATELET
BASO%: 0.8 % (ref 0.0–2.0)
BASOS ABS: 0 10*3/uL (ref 0.0–0.1)
EOS ABS: 0.1 10*3/uL (ref 0.0–0.5)
EOS%: 3.1 % (ref 0.0–7.0)
HCT: 37 % (ref 34.8–46.6)
HGB: 12 g/dL (ref 11.6–15.9)
LYMPH#: 2 10*3/uL (ref 0.9–3.3)
LYMPH%: 43.3 % (ref 14.0–49.7)
MCH: 30.3 pg (ref 25.1–34.0)
MCHC: 32.6 g/dL (ref 31.5–36.0)
MCV: 93.2 fL (ref 79.5–101.0)
MONO#: 0.7 10*3/uL (ref 0.1–0.9)
MONO%: 14.4 % — ABNORMAL HIGH (ref 0.0–14.0)
NEUT%: 38.4 % (ref 38.4–76.8)
NEUTROS ABS: 1.8 10*3/uL (ref 1.5–6.5)
PLATELETS: 239 10*3/uL (ref 145–400)
RBC: 3.97 10*6/uL (ref 3.70–5.45)
RDW: 13.9 % (ref 11.2–14.5)
WBC: 4.7 10*3/uL (ref 3.9–10.3)

## 2014-09-13 ENCOUNTER — Other Ambulatory Visit (HOSPITAL_COMMUNITY)
Admission: RE | Admit: 2014-09-13 | Discharge: 2014-09-13 | Disposition: A | Discharge status: Home / Self Care | Payer: Medicaid - Out of State | Source: Ambulatory Visit | Attending: Internal Medicine | Admitting: Internal Medicine

## 2014-09-13 DIAGNOSIS — C3490 Malignant neoplasm of unspecified part of unspecified bronchus or lung: Secondary | ICD-10-CM | POA: Diagnosis present

## 2014-09-18 ENCOUNTER — Encounter: Payer: Self-pay | Admitting: Physician Assistant

## 2014-09-18 ENCOUNTER — Telehealth: Payer: Self-pay | Admitting: Physician Assistant

## 2014-09-18 ENCOUNTER — Ambulatory Visit (HOSPITAL_BASED_OUTPATIENT_CLINIC_OR_DEPARTMENT_OTHER): Payer: Self-pay | Admitting: Physician Assistant

## 2014-09-18 ENCOUNTER — Other Ambulatory Visit (HOSPITAL_BASED_OUTPATIENT_CLINIC_OR_DEPARTMENT_OTHER): Payer: Self-pay

## 2014-09-18 VITALS — BP 155/94 | HR 109 | Temp 98.9°F | Resp 18 | Ht 65.0 in | Wt 177.3 lb

## 2014-09-18 DIAGNOSIS — C3490 Malignant neoplasm of unspecified part of unspecified bronchus or lung: Secondary | ICD-10-CM

## 2014-09-18 DIAGNOSIS — C3411 Malignant neoplasm of upper lobe, right bronchus or lung: Secondary | ICD-10-CM

## 2014-09-18 DIAGNOSIS — C3412 Malignant neoplasm of upper lobe, left bronchus or lung: Secondary | ICD-10-CM

## 2014-09-18 DIAGNOSIS — F329 Major depressive disorder, single episode, unspecified: Secondary | ICD-10-CM

## 2014-09-18 DIAGNOSIS — K219 Gastro-esophageal reflux disease without esophagitis: Secondary | ICD-10-CM

## 2014-09-18 LAB — COMPREHENSIVE METABOLIC PANEL (CC13)
ALBUMIN: 3.4 g/dL — AB (ref 3.5–5.0)
ALT: 40 U/L (ref 0–55)
ANION GAP: 12 meq/L — AB (ref 3–11)
AST: 29 U/L (ref 5–34)
Alkaline Phosphatase: 158 U/L — ABNORMAL HIGH (ref 40–150)
BUN: 8.3 mg/dL (ref 7.0–26.0)
CO2: 24 mEq/L (ref 22–29)
CREATININE: 0.7 mg/dL (ref 0.6–1.1)
Calcium: 9.5 mg/dL (ref 8.4–10.4)
Chloride: 105 mEq/L (ref 98–109)
EGFR: >90 mL/min/{1.73_m2} (ref >90)
Glucose: 148 mg/dl — ABNORMAL HIGH (ref 70–140)
POTASSIUM: 4.3 meq/L (ref 3.5–5.1)
SODIUM: 142 meq/L (ref 136–145)
Total Bilirubin: <0.2 mg/dL (ref 0.20–1.20)
Total Protein: 7 g/dL (ref 6.4–8.3)

## 2014-09-18 LAB — CBC WITH DIFFERENTIAL/PLATELET
BASO%: 0.5 % (ref 0.0–2.0)
BASOS ABS: 0 10*3/uL (ref 0.0–0.1)
EOS%: 0.5 % (ref 0.0–7.0)
Eosinophils Absolute: 0 10*3/uL (ref 0.0–0.5)
HEMATOCRIT: 37.3 % (ref 34.8–46.6)
HEMOGLOBIN: 12 g/dL (ref 11.6–15.9)
LYMPH%: 25.2 % (ref 14.0–49.7)
MCH: 30.7 pg (ref 25.1–34.0)
MCHC: 32.2 g/dL (ref 31.5–36.0)
MCV: 95.4 fL (ref 79.5–101.0)
MONO#: 0.3 10*3/uL (ref 0.1–0.9)
MONO%: 5.9 % (ref 0.0–14.0)
NEUT#: 3 10*3/uL (ref 1.5–6.5)
NEUT%: 67.9 % (ref 38.4–76.8)
PLATELETS: 380 10*3/uL (ref 145–400)
RBC: 3.91 10*6/uL (ref 3.70–5.45)
RDW: 15 % — ABNORMAL HIGH (ref 11.2–14.5)
WBC: 4.4 10*3/uL (ref 3.9–10.3)
lymph#: 1.1 10*3/uL (ref 0.9–3.3)

## 2014-09-18 LAB — TECHNOLOGIST REVIEW: Technologist Review: 2

## 2014-09-18 NOTE — Telephone Encounter (Signed)
Pt confirmed labs/ov per 02/22 POF, gave pt AVS... KJ , sent msg to add chemo

## 2014-09-18 NOTE — Progress Notes (Addendum)
[place] [phone]   [phone]  OFFICE PROGRESS NOTE  No PCP Per Patient No address on file  DIAGNOSIS: stage IV (T2a, N0, M1a) non-small cell lung cancer, adenocarcinoma presented with bilateral pulmonary lesions. This could be also consider as synchronous primary tumors.  PRIOR THERAPY: None.  CURRENT THERAPY: Systemic chemotherapy with carboplatin for AUC of 5 and Alimta 500 MG/M2 every 3 weeks. Status post 1 cycle  INTERVAL HISTORY: Heather Banks 64 y.o. female returns to the clinic today for follow-up visit. The patient tolerated the first week of her treatment fairly well with no significant adverse effects.She denied having any significant nausea or vomiting, she has no fever or chills. The patient denied having any significant chest pain, shortness breath, cough or hemoptysis. She has no significant weight loss or night sweats.  MEDICAL HISTORY: Past Medical History  Diagnosis Date  . COPD (chronic obstructive pulmonary disease)     Albuterol neb as needed;Pulmicort neb daily  . GERD (gastroesophageal reflux disease)     takes Pantoprazole and Zantac daily  . Lung mass   . Pneumonia     hx of-last time about 4+yrs ago  . History of bronchitis     >5 yrs ago  . History of migraine     last one 2 wks ago  . Panic attacks     but doesn't take any meds    ALLERGIES:  has No Known Allergies.  MEDICATIONS:  Current Outpatient Prescriptions  Medication Sig Dispense Refill  . albuterol (PROVENTIL) (2.5 MG/3ML) 0.083% nebulizer solution Take 3 mLs (2.5 mg total) by nebulization every 6 (six) hours as needed for wheezing or shortness of breath. 75 mL 12  . ALPRAZolam (XANAX) 0.5 MG tablet Take 1 tablet (0.5 mg total) by mouth at bedtime as needed for anxiety. (Patient taking differently: Take 0.5 mg by mouth at bedtime. ) 60 tablet 0  . aspirin-acetaminophen-caffeine (EXCEDRIN MIGRAINE) 250-250-65 MG per tablet Take 2 tablets by  mouth every 6 (six) hours as needed for headache.    . budesonide (PULMICORT) 0.25 MG/2ML nebulizer solution Take 0.25 mg by nebulization 2 (two) times daily.    Marland Kitchen dexamethasone (DECADRON) 4 MG tablet 4 mg by mouth twice a day the day before, day of and day after the chemotherapy every 3 weeks 40 tablet 1  . folic acid (FOLVITE) 1 MG tablet Take 1 tablet (1 mg total) by mouth daily. 30 tablet 4  . HYDROcodone-acetaminophen (NORCO/VICODIN) 5-325 MG per tablet Take 1 tablet by mouth every 6 (six) hours as needed for moderate pain or severe pain. 30 tablet 0  . lidocaine-prilocaine (EMLA) cream Apply 1 application topically as needed. (Patient taking differently: Apply 1 application topically as needed (For port-a-cath.). ) 30 g 1  . Melatonin 10 MG TABS Take 10 mg by mouth at bedtime as needed (For sleep.).     Marland Kitchen mirtazapine (REMERON) 30 MG tablet Take 1 tablet (30 mg total) by mouth at bedtime. 30 tablet 2  . Multiple Vitamins-Minerals (MULTIVITAMIN WITH MINERALS) tablet Take 1 tablet by mouth daily.    . nicotine (NICODERM CQ) 21 mg/24hr patch Place 1 patch (21 mg total) onto the skin daily. 28 patch 0  . omeprazole (PRILOSEC) 20 MG capsule Take 1 capsule (20 mg total) by mouth 2 (two) times daily before a meal. 60 capsule 1  . PRESCRIPTION MEDICATION She receives her treatments at the [facility]. Her oncologist is Dr.  Mohamed. She received Alimta 929m and Carboplatin 5659mon 08/30/14.    . Marland Kitchenrochlorperazine (COMPAZINE) 10 MG tablet Take 1 tablet (10 mg total) by mouth every 6 (six) hours as needed for nausea or vomiting. 60 tablet 0  . ranitidine (ZANTAC) 150 MG tablet Take 150 mg by mouth 2 (two) times daily.     No current facility-administered medications for this visit.    SURGICAL HISTORY:  Past Surgical History  Procedure Laterality Date  . Abdominal hysterectomy    . Oophorectomy    . Appendectomy    . Tonsillectomy    . Video bronchoscopy with endobronchial  navigation N/A 08/14/2014    Procedure: VIDEO BRONCHOSCOPY WITH ENDOBRONCHIAL NAVIGATION;  Surgeon: RaRigoberto NoelMD;  [facility];  Laterality: N/A;  . Portacath placement  jan. 2016    REVIEW OF SYSTEMS:  A comprehensive review of systems was negative except for: Constitutional: positive for fatigue Gastrointestinal: positive for Acid reflux Behavioral/Psych: positive for depression   PHYSICAL EXAMINATION: General appearance: alert, cooperative and no distress Head: Normocephalic, without obvious abnormality, atraumatic Neck: no adenopathy, no JVD, supple, symmetrical, trachea midline and thyroid not enlarged, symmetric, no tenderness/mass/nodules Lymph nodes: Cervical, supraclavicular, and axillary nodes normal. Resp: clear to auscultation bilaterally Back: symmetric, no curvature. ROM normal. No CVA tenderness. Cardio: regular rate and rhythm, S1, S2 normal, no murmur, click, rub or gallop GI: soft, non-tender; bowel sounds normal; no masses,  no organomegaly Extremities: extremities normal, atraumatic, no cyanosis or edema  ECOG PERFORMANCE STATUS: 1 - Symptomatic but completely ambulatory  Blood pressure 155/94, pulse 109, temperature 98.9 F (37.2 C), temperature source Oral, resp. rate 18, height 5' 5"  (1.651 m), weight 177 lb 4.8 oz (80.423 kg), SpO2 99 %.  LABORATORY DATA: Lab Results  Component Value Date   WBC 4.4 09/18/2014   HGB 12.0 09/18/2014   HCT 37.3 09/18/2014   MCV 95.4 09/18/2014   PLT 380 09/18/2014      Chemistry      Component Value Date/Time   NA 142 09/18/2014 1325   NA 138 08/11/2014 0837   K 4.3 09/18/2014 1325   K 3.8 08/11/2014 0837   CL 103 08/11/2014 0837   CO2 24 09/18/2014 1325   CO2 23 08/11/2014 0837   BUN 8.3 09/18/2014 1325   BUN 8 08/11/2014 0837   CREATININE 0.7 09/18/2014 1325   CREATININE 0.57 08/11/2014 0837      Component Value Date/Time   CALCIUM 9.5 09/18/2014 1325   CALCIUM 9.0 08/11/2014 0837     ALKPHOS 158* 09/18/2014 1325   ALKPHOS 145* 07/10/2014 0459   AST 29 09/18/2014 1325   AST 11 07/10/2014 0459   ALT 40 09/18/2014 1325   ALT 8 07/10/2014 0459   BILITOT <0.20 09/18/2014 1325   BILITOT <0.2* 07/10/2014 0459       RADIOGRAPHIC STUDIES: Dg Chest 1 View  09/07/2014   CLINICAL DATA:  Status post CT directed left upper lobe biopsy. Left upper lobe CT directed biopsy  EXAM: CHEST  1 VIEW  COMPARISON:  CT scan of the chest of today's date as well as August 25, 2014  FINDINGS: The lungs are well-expanded. There is a known abnormal soft tissue mass in the left upper lobe. There is no postprocedure pneumothorax. The right lung is adequately inflated. There is persistent density in the right upper lobe just above the power port appliance reservoir. The heart and pulmonary vascularity are normal. The power port appliance tip  projects over the midportion of SVC. There is no pleural effusion. The bony thorax is unremarkable.  IMPRESSION: There is no postprocedure pneumothorax following the CT directed biopsy of the left upper lobe mass. There is a persistent abnormal density in the right upper lobe.   Electronically Signed   By: David  Martinique   On: 09/07/2014 16:35   Dg Chest 1 View  08/25/2014   CLINICAL DATA:  Follow-up right apical pneumothorax identified after bronchoscopy approximately 2 weeks ago.  EXAM: PORTABLE CHEST - 1 VIEW  COMPARISON:  08/21/2014 dating back to 08/14/2014. PET-CT 07/31/2014.  FINDINGS: Interval near complete resolution of the right apical pneumothorax, with a very small (less than 5%) loculated pneumothorax persisting laterally at the right apex. Bilateral upper lobe lung mass is, left greater than right, as noted previously. No new pulmonary parenchymal abnormality. Cardiac silhouette normal in size, unchanged.  IMPRESSION: Interval near complete resolution of the right apical pneumothorax, with only a small (less than 5%) loculated pneumothorax persisting  laterally at the right apex.   Electronically Signed   By: Evangeline Dakin M.D.   On: 08/25/2014 13:37   Dg Chest 2 View  08/21/2014   CLINICAL DATA:  History of COPD. Pneumonia and bronchitis. Lung mass. Followup of pneumothorax.  EXAM: CHEST  2 VIEW  COMPARISON:  08/17/2014  FINDINGS: Hyperinflation. Midline trachea. Normal heart size. Atherosclerosis in the transverse aorta. No pleural fluid. Left apical and right upper lobe pulmonary lesions are again identified. Diffuse peribronchial thickening. Decrease right-sided pneumothorax. On the order of 5%. Visceral pleural line maximally 8 mm from the chest wall at the apex. 10 mm at the same level on the prior. Subpulmonic component is resolved.  IMPRESSION: Decrease in approximately 5% right apical pneumothorax.  Left apical and right upper lobe lung lesions, as before.   Electronically Signed   By: Abigail Miyamoto M.D.   On: 08/21/2014 13:52   Ir Fluoro Guide Cv Line Right  08/25/2014   INDICATION: History of lung cancer, in need of intravenous access for chemotherapy administration.  EXAM: IMPLANTED PORT A CATH PLACEMENT WITH ULTRASOUND AND FLUOROSCOPIC GUIDANCE  COMPARISON:  PET-CT -07/31/2014  MEDICATIONS: Ancef 2 gm IV; The antibiotic was administered within an appropriate time interval prior to skin puncture.  ANESTHESIA/SEDATION: Versed 2.5 mg IV; Fentanyl 125 mcg IV;  Total Moderate Sedation Time  26  minutes.  CONTRAST:  None  FLUOROSCOPY TIME:  12 seconds (4 mGy)  COMPLICATIONS: None immediate  PROCEDURE: The procedure, risks, benefits, and alternatives were explained to the patient. Questions regarding the procedure were encouraged and answered. The patient understands and consents to the procedure.  The right neck and chest were prepped with chlorhexidine in a sterile fashion, and a sterile drape was applied covering the operative field. Maximum barrier sterile technique with sterile gowns and gloves were used for the procedure. A timeout was  performed prior to the initiation of the procedure. Local anesthesia was provided with 1% lidocaine with epinephrine.  After creating a small venotomy incision, a micropuncture kit was utilized to access the internal jugular vein under direct, real-time ultrasound guidance. Ultrasound image documentation was performed. The microwire was kinked to measure appropriate catheter length.  A subcutaneous port pocket was then created along the upper chest wall utilizing a combination of sharp and blunt dissection. The pocket was irrigated with sterile saline. A single lumen ISP power injectable port was chosen for placement. The 8 Fr catheter was tunneled from the port pocket site to  the venotomy incision. The port was placed in the pocket. The external catheter was trimmed to appropriate length. At the venotomy, an 8 Fr peel-away sheath was placed over a guidewire under fluoroscopic guidance. The catheter was then placed through the sheath and the sheath was removed. Final catheter positioning was confirmed and documented with a fluoroscopic spot radiograph. The port was accessed with a Huber needle, aspirated and flushed with heparinized saline.  The venotomy site was closed with an interrupted 4-0 Vicryl suture. The port pocket incision was closed with interrupted 2-0 Vicryl suture and the skin was opposed with a running subcuticular 4-0 Vicryl suture. Dermabond and Steri-strips were applied to both incisions. Dressings were placed. The patient tolerated the procedure well without immediate post procedural complication.  FINDINGS: After catheter placement, the tip lies within the superior cavoatrial junction. The catheter aspirates and flushes normally and is ready for immediate use.  IMPRESSION: Successful placement of a right internal jugular approach power injectable Port-A-Cath. The catheter is ready for immediate use.   Electronically Signed   By: Sandi Mariscal M.D.   On: 08/25/2014 15:35   Ir US Guide Vasc Access  Right  08/25/2014   INDICATION: History of lung cancer, in need of intravenous access for chemotherapy administration.  EXAM: IMPLANTED PORT A CATH PLACEMENT WITH ULTRASOUND AND FLUOROSCOPIC GUIDANCE  COMPARISON:  PET-CT -07/31/2014  MEDICATIONS: Ancef 2 gm IV; The antibiotic was administered within an appropriate time interval prior to skin puncture.  ANESTHESIA/SEDATION: Versed 2.5 mg IV; Fentanyl 125 mcg IV;  Total Moderate Sedation Time  26  minutes.  CONTRAST:  None  FLUOROSCOPY TIME:  12 seconds (4 mGy)  COMPLICATIONS: None immediate  PROCEDURE: The procedure, risks, benefits, and alternatives were explained to the patient. Questions regarding the procedure were encouraged and answered. The patient understands and consents to the procedure.  The right neck and chest were prepped with chlorhexidine in a sterile fashion, and a sterile drape was applied covering the operative field. Maximum barrier sterile technique with sterile gowns and gloves were used for the procedure. A timeout was performed prior to the initiation of the procedure. Local anesthesia was provided with 1% lidocaine with epinephrine.  After creating a small venotomy incision, a micropuncture kit was utilized to access the internal jugular vein under direct, real-time ultrasound guidance. Ultrasound image documentation was performed. The microwire was kinked to measure appropriate catheter length.  A subcutaneous port pocket was then created along the upper chest wall utilizing a combination of sharp and blunt dissection. The pocket was irrigated with sterile saline. A single lumen ISP power injectable port was chosen for placement. The 8 Fr catheter was tunneled from the port pocket site to the venotomy incision. The port was placed in the pocket. The external catheter was trimmed to appropriate length. At the venotomy, an 8 Fr peel-away sheath was placed over a guidewire under fluoroscopic guidance. The catheter was then placed through the  sheath and the sheath was removed. Final catheter positioning was confirmed and documented with a fluoroscopic spot radiograph. The port was accessed with a Huber needle, aspirated and flushed with heparinized saline.  The venotomy site was closed with an interrupted 4-0 Vicryl suture. The port pocket incision was closed with interrupted 2-0 Vicryl suture and the skin was opposed with a running subcuticular 4-0 Vicryl suture. Dermabond and Steri-strips were applied to both incisions. Dressings were placed. The patient tolerated the procedure well without immediate post procedural complication.  FINDINGS: After catheter placement, the  tip lies within the superior cavoatrial junction. The catheter aspirates and flushes normally and is ready for immediate use.  IMPRESSION: Successful placement of a right internal jugular approach power injectable Port-A-Cath. The catheter is ready for immediate use.   Electronically Signed   By: Sandi Mariscal M.D.   On: 08/25/2014 15:35   Ct Biopsy  09/07/2014   CLINICAL DATA:  Left upper lobe adenocarcinoma. Positive bronchoscopic biopsy of right upper lobe lesion. Additional material needed for molecular studies.  EXAM: CT GUIDED CORE BIOPSY OF LEFT UPPER LOBE LUNG MASS  ANESTHESIA/SEDATION: Intravenous Fentanyl and Versed were administered as conscious sedation during continuous cardiorespiratory monitoring by the radiology RN, with a total moderate sedation time of 13 minutes.  PROCEDURE: The procedure risks, benefits, and alternatives were explained to the patient. Questions regarding the procedure were encouraged and answered. The patient understands and consents to the procedure.  The operative field was prepped with Betadine/chlorhexidinein a sterile fashion, and a sterile drape was applied covering the operative field. A sterile gown and sterile gloves were used for the procedure. Local anesthesia was provided with 1% Lidocaine.  Under CT fluoroscopic guidance, a 17 gauge  trocar needle was advanced to the margin of the lesion. Once needle tip position was confirmed, coaxial 18-gauge core biopsy samples were obtained, submitted in formalin to surgical pathology. The guide needle was removed. Postprocedure scans show a small amount of regional alveolar hemorrhage. No pneumothorax.  COMPLICATIONS: None immediate  FINDINGS: Limited CT scans confirm bilateral upper lobe masses, left greater than right. CT-guided core biopsy of the left upper lobe lesion was performed.  IMPRESSION: 1. Technically successful CT-guided core biopsy of left upper lobe lung mass.   Electronically Signed   By: Lucrezia Europe M.D.   On: 09/07/2014 12:51    ASSESSMENT AND PLAN: This is a very pleasant 64 years old white female with stage IV non-small cell lung cancer currently undergoing systemic chemotherapy with carboplatin and Alimta status post 1 cycle. She tolerated her first cycle of chemotherapy relatively well. Patient was discussed with and also seen by Dr. Julien Nordmann. She will proceed with cycle #2 of her systemic chemotherapy with carboplatin and Alimta as scheduled. She will follow-up in 3 weeks prior to cycle #3. The patient tolerated the first week of her treatment fairly well. For depression, she will continue on Remeron 30 mg by mouth daily at bedtime. For the acid reflux, she will continue on Prilosec 20 mg by mouth twice a day.  She was advised to call immediately if she has any concerning symptoms in the interval. The patient voices understanding of current disease status and treatment options and is in agreement with the current care plan.  All questions were answered. The patient knows to call the clinic with any problems, questions or concerns. We can certainly see the patient much sooner if necessary.  Carlton Adam, PA-C 09/18/2014 ADDENDUM: Hematology/Oncology Attending: I had a face to face encounter with the patient. I recommended her care plan. This is a very pleasant 64  years old white female recently diagnosed with a stage IV non-small cell lung cancer, adenocarcinoma with bilateral pulmonary masses and she is currently undergoing systemic chemotherapy with carboplatin and Alimta. She tolerated the first cycle of her treatment fairly well with no significant adverse effects. We will proceed with cycle #2 today as a scheduled. The patient recently underwent CT-guided core biopsy of the left upper lobe lung mass and the tissue biopsy was sent for molecular studies.  Was negative for ALK gene translocation but the EGFR mutation is still pending. The patient would come back for follow-up visit in 3 weeks for reevaluation and management any adverse effect of her treatment. She will continue her current treatment with Remeron for depression and Prilosec for GERD. She was advised to call immediately if she has any concerning symptoms in the interval.  Disclaimer: This note was dictated with voice recognition software. Similar sounding words can inadvertently be transcribed and may not be corrected upon review. [name]., MD 09/23/2014

## 2014-09-19 ENCOUNTER — Ambulatory Visit (HOSPITAL_BASED_OUTPATIENT_CLINIC_OR_DEPARTMENT_OTHER): Payer: Self-pay

## 2014-09-19 ENCOUNTER — Other Ambulatory Visit: Payer: Self-pay

## 2014-09-19 ENCOUNTER — Telehealth: Payer: Self-pay | Admitting: *Deleted

## 2014-09-19 DIAGNOSIS — C3490 Malignant neoplasm of unspecified part of unspecified bronchus or lung: Secondary | ICD-10-CM

## 2014-09-19 DIAGNOSIS — Z5111 Encounter for antineoplastic chemotherapy: Secondary | ICD-10-CM

## 2014-09-19 DIAGNOSIS — C3411 Malignant neoplasm of upper lobe, right bronchus or lung: Secondary | ICD-10-CM

## 2014-09-19 MED ORDER — ONDANSETRON 16 MG/50ML IVPB (CHCC)
16.0000 mg | Freq: Once | INTRAVENOUS | Status: AC
  Administered 2014-09-19: 16 mg via INTRAVENOUS

## 2014-09-19 MED ORDER — HEPARIN SOD (PORK) LOCK FLUSH 100 UNIT/ML IV SOLN
500.0000 [IU] | Freq: Once | INTRAVENOUS | Status: AC | PRN
  Administered 2014-09-19: 500 [IU]
  Filled 2014-09-19: qty 5

## 2014-09-19 MED ORDER — ONDANSETRON 16 MG/50ML IVPB (CHCC)
INTRAVENOUS | Status: AC
  Filled 2014-09-19: qty 16

## 2014-09-19 MED ORDER — SODIUM CHLORIDE 0.9 % IV SOLN
Freq: Once | INTRAVENOUS | Status: AC
  Administered 2014-09-19: 10:00:00 via INTRAVENOUS

## 2014-09-19 MED ORDER — CARBOPLATIN CHEMO INJECTION 600 MG/60ML
556.5000 mg | Freq: Once | INTRAVENOUS | Status: AC
  Administered 2014-09-19: 560 mg via INTRAVENOUS
  Filled 2014-09-19: qty 56

## 2014-09-19 MED ORDER — DEXAMETHASONE SODIUM PHOSPHATE 20 MG/5ML IJ SOLN
INTRAMUSCULAR | Status: AC
  Filled 2014-09-19: qty 5

## 2014-09-19 MED ORDER — SODIUM CHLORIDE 0.9 % IV SOLN
500.0000 mg/m2 | Freq: Once | INTRAVENOUS | Status: AC
  Administered 2014-09-19: 925 mg via INTRAVENOUS
  Filled 2014-09-19: qty 37

## 2014-09-19 MED ORDER — SODIUM CHLORIDE 0.9 % IJ SOLN
10.0000 mL | INTRAMUSCULAR | Status: DC | PRN
  Administered 2014-09-19: 10 mL
  Filled 2014-09-19: qty 10

## 2014-09-19 MED ORDER — DEXAMETHASONE SODIUM PHOSPHATE 20 MG/5ML IJ SOLN
20.0000 mg | Freq: Once | INTRAMUSCULAR | Status: AC
  Administered 2014-09-19: 20 mg via INTRAVENOUS

## 2014-09-19 NOTE — Telephone Encounter (Signed)
Per staff message and POF I have scheduled appts. Advised scheduler of appts. JMW  

## 2014-09-19 NOTE — Patient Instructions (Signed)
[facility] Discharge Instructions for Patients Receiving Chemotherapy  Today you received the following chemotherapy agents: Carboplatin, alimta   To help prevent nausea and vomiting after your treatment, we encourage you to take your nausea medication as prescribed.   If you develop nausea and vomiting that is not controlled by your nausea medication, call the clinic.   BELOW ARE SYMPTOMS THAT SHOULD BE REPORTED IMMEDIATELY:  *FEVER GREATER THAN 100.5 F  *CHILLS WITH OR WITHOUT FEVER  NAUSEA AND VOMITING THAT IS NOT CONTROLLED WITH YOUR NAUSEA MEDICATION  *UNUSUAL SHORTNESS OF BREATH  *UNUSUAL BRUISING OR BLEEDING  TENDERNESS IN MOUTH AND THROAT WITH OR WITHOUT PRESENCE OF ULCERS  *URINARY PROBLEMS  *BOWEL PROBLEMS  UNUSUAL RASH Items with * indicate a potential emergency and should be followed up as soon as possible.  Feel free to call the clinic you have any questions or concerns. The clinic phone number is [phone].

## 2014-09-22 ENCOUNTER — Encounter (HOSPITAL_COMMUNITY): Payer: Self-pay

## 2014-09-22 NOTE — Patient Instructions (Signed)
Continue labs and chemotherapy as scheduled Follow-up in 3 weeks prior to next scheduled cycle of chemotherapy

## 2014-09-25 ENCOUNTER — Encounter (HOSPITAL_COMMUNITY): Payer: Self-pay

## 2014-09-25 ENCOUNTER — Other Ambulatory Visit (HOSPITAL_BASED_OUTPATIENT_CLINIC_OR_DEPARTMENT_OTHER): Payer: Self-pay

## 2014-09-25 DIAGNOSIS — C3490 Malignant neoplasm of unspecified part of unspecified bronchus or lung: Secondary | ICD-10-CM

## 2014-09-25 DIAGNOSIS — C3412 Malignant neoplasm of upper lobe, left bronchus or lung: Secondary | ICD-10-CM

## 2014-09-25 DIAGNOSIS — C3411 Malignant neoplasm of upper lobe, right bronchus or lung: Secondary | ICD-10-CM

## 2014-09-25 LAB — CBC WITH DIFFERENTIAL/PLATELET
BASO%: 1.1 % (ref 0.0–2.0)
Basophils Absolute: 0 10*3/uL (ref 0.0–0.1)
EOS%: 2.7 % (ref 0.0–7.0)
Eosinophils Absolute: 0.1 10*3/uL (ref 0.0–0.5)
HCT: 36.9 % (ref 34.8–46.6)
HEMOGLOBIN: 12 g/dL (ref 11.6–15.9)
LYMPH#: 1.3 10*3/uL (ref 0.9–3.3)
LYMPH%: 44.5 % (ref 14.0–49.7)
MCH: 30.3 pg (ref 25.1–34.0)
MCHC: 32.4 g/dL (ref 31.5–36.0)
MCV: 93.5 fL (ref 79.5–101.0)
MONO#: 0.1 10*3/uL (ref 0.1–0.9)
MONO%: 4.2 % (ref 0.0–14.0)
NEUT#: 1.4 10*3/uL — ABNORMAL LOW (ref 1.5–6.5)
NEUT%: 47.5 % (ref 38.4–76.8)
Platelets: 279 10*3/uL (ref 145–400)
RBC: 3.95 10*6/uL (ref 3.70–5.45)
RDW: 14.5 % (ref 11.2–14.5)
WBC: 3 10*3/uL — ABNORMAL LOW (ref 3.9–10.3)

## 2014-09-25 LAB — COMPREHENSIVE METABOLIC PANEL (CC13)
ALBUMIN: 3.5 g/dL (ref 3.5–5.0)
ALT: 51 U/L (ref 0–55)
ANION GAP: 9 meq/L (ref 3–11)
AST: 34 U/L (ref 5–34)
Alkaline Phosphatase: 129 U/L (ref 40–150)
BILIRUBIN TOTAL: 0.3 mg/dL (ref 0.20–1.20)
BUN: 15.2 mg/dL (ref 7.0–26.0)
CO2: 27 meq/L (ref 22–29)
Calcium: 9.5 mg/dL (ref 8.4–10.4)
Chloride: 101 mEq/L (ref 98–109)
Creatinine: 0.7 mg/dL (ref 0.6–1.1)
GLUCOSE: 96 mg/dL (ref 70–140)
Potassium: 4.2 mEq/L (ref 3.5–5.1)
Sodium: 137 mEq/L (ref 136–145)
TOTAL PROTEIN: 6.7 g/dL (ref 6.4–8.3)

## 2014-10-02 ENCOUNTER — Other Ambulatory Visit (HOSPITAL_BASED_OUTPATIENT_CLINIC_OR_DEPARTMENT_OTHER): Payer: Self-pay

## 2014-10-02 DIAGNOSIS — C3490 Malignant neoplasm of unspecified part of unspecified bronchus or lung: Secondary | ICD-10-CM

## 2014-10-02 DIAGNOSIS — C3411 Malignant neoplasm of upper lobe, right bronchus or lung: Secondary | ICD-10-CM

## 2014-10-02 DIAGNOSIS — C3412 Malignant neoplasm of upper lobe, left bronchus or lung: Secondary | ICD-10-CM

## 2014-10-02 LAB — COMPREHENSIVE METABOLIC PANEL (CC13)
ALBUMIN: 3.3 g/dL — AB (ref 3.5–5.0)
ALT: 25 U/L (ref 0–55)
ANION GAP: 11 meq/L (ref 3–11)
AST: 20 U/L (ref 5–34)
Alkaline Phosphatase: 133 U/L (ref 40–150)
BUN: 8.9 mg/dL (ref 7.0–26.0)
CALCIUM: 9.5 mg/dL (ref 8.4–10.4)
CO2: 25 meq/L (ref 22–29)
CREATININE: 0.7 mg/dL (ref 0.6–1.1)
Chloride: 106 mEq/L (ref 98–109)
EGFR: >90 mL/min/{1.73_m2} (ref >90)
Glucose: 120 mg/dl (ref 70–140)
Potassium: 4.4 mEq/L (ref 3.5–5.1)
SODIUM: 142 meq/L (ref 136–145)
TOTAL PROTEIN: 6.6 g/dL (ref 6.4–8.3)
Total Bilirubin: <0.2 mg/dL (ref 0.20–1.20)

## 2014-10-02 LAB — CBC WITH DIFFERENTIAL/PLATELET
BASO%: 0.6 % (ref 0.0–2.0)
Basophils Absolute: 0 10*3/uL (ref 0.0–0.1)
EOS%: 2.3 % (ref 0.0–7.0)
Eosinophils Absolute: 0.1 10*3/uL (ref 0.0–0.5)
HCT: 36 % (ref 34.8–46.6)
HGB: 11.5 g/dL — ABNORMAL LOW (ref 11.6–15.9)
LYMPH%: 38.1 % (ref 14.0–49.7)
MCH: 30.1 pg (ref 25.1–34.0)
MCHC: 32 g/dL (ref 31.5–36.0)
MCV: 94.3 fL (ref 79.5–101.0)
MONO#: 0.7 10*3/uL (ref 0.1–0.9)
MONO%: 17.2 % — AB (ref 0.0–14.0)
NEUT#: 1.6 10*3/uL (ref 1.5–6.5)
NEUT%: 41.8 % (ref 38.4–76.8)
Platelets: 227 10*3/uL (ref 145–400)
RBC: 3.81 10*6/uL (ref 3.70–5.45)
RDW: 15.1 % — ABNORMAL HIGH (ref 11.2–14.5)
WBC: 3.9 10*3/uL (ref 3.9–10.3)
lymph#: 1.5 10*3/uL (ref 0.9–3.3)

## 2014-10-04 ENCOUNTER — Ambulatory Visit: Payer: Self-pay | Admitting: Pulmonary Disease

## 2014-10-05 ENCOUNTER — Ambulatory Visit (INDEPENDENT_AMBULATORY_CARE_PROVIDER_SITE_OTHER): Payer: Self-pay | Admitting: Pulmonary Disease

## 2014-10-05 ENCOUNTER — Encounter: Payer: Self-pay | Admitting: Pulmonary Disease

## 2014-10-05 VITALS — BP 120/68 | HR 109 | Temp 98.2°F | Ht 65.0 in | Wt 179.0 lb

## 2014-10-05 DIAGNOSIS — J441 Chronic obstructive pulmonary disease with (acute) exacerbation: Secondary | ICD-10-CM

## 2014-10-05 DIAGNOSIS — C3490 Malignant neoplasm of unspecified part of unspecified bronchus or lung: Secondary | ICD-10-CM

## 2014-10-05 MED ORDER — HYDROCODONE-ACETAMINOPHEN 5-325 MG PO TABS: 1.0000 | ORAL_TABLET | Freq: Four times a day (QID) | ORAL | Status: DC | PRN

## 2014-10-05 MED ORDER — FLUTICASONE FUROATE-VILANTEROL 100-25 MCG/INH IN AEPB: 1.0000 | INHALATION_SPRAY | Freq: Every day | RESPIRATORY_TRACT | Status: DC

## 2014-10-05 NOTE — Patient Instructions (Signed)
Refill on vicodin #30 Rx for breo 100 x 1 months with 5 refills Your are doing well Call as needed

## 2014-10-05 NOTE — Progress Notes (Signed)
   Subjective:    Patient ID: Heather Banks, female    DOB: 1951/03/07, 64 y.o.   MRN: [id number]  HPI  64 yo smoker with COPD and adenocarcinoma lung, stage IV.  Admitted 06/1314 for COPD exacerbation.  CT showed bilateral upper lobe lung mass w/ minmally enlarged hilar and mediastinal nodes, hypermetabolic on PET She was discharged on oxygen at 3 L. PFT showed an FEV1 at 46%, ratio of 59. Diffusing capacity was decreased at 56% , FVC was 60%  Underwent ENB guided biopsy , T1 (LUL ) neg, T2 & T3 (RUL) showed adenoCA.    10/05/2014  Chief Complaint  Patient presents with  . Follow-up    Pt here for lung ca f/u. She will have 3 rd round of chemotherapy on next week. She is having mild side effects of chemo. She is having some constipation.   On chemo - carboplatin + alimta cycle #3 - tolerated first 2 well -minimla hair loss C/o costipation Headaches worse - hasnot seen PCP -wants refill on vicodin Pulmicort has fallen off list - Breo worked,needs Rx Reports smoking is decreased to 1-2cigs per days  Review of Systems neg for any significant sore throat, dysphagia, itching, sneezing, nasal congestion or excess/ purulent secretions, fever, chills, sweats, unintended wt loss, pleuritic or exertional cp, hempoptysis, orthopnea pnd or change in chronic leg swelling. Also denies presyncope, palpitations, heartburn, abdominal pain, nausea, vomiting, diarrhea or change in bowel or urinary habits, dysuria,hematuria, rash, arthralgias, visual complaints, headache, numbness weakness or ataxia.     Objective:   Physical Exam  Gen. Pleasant, well-nourished, in no distress ENT - no lesions, no post nasal drip Neck: No JVD, no thyromegaly, no carotid bruits Lungs: no use of accessory muscles, no dullness to percussion, clear without rales or rhonchi  Cardiovascular: Rhythm regular, heart sounds  normal, no murmurs or gallops, no peripheral edema Musculoskeletal: No deformities, no cyanosis or  clubbing        Assessment & Plan:

## 2014-10-06 NOTE — Assessment & Plan Note (Signed)
Rx for breo 100 x 1 months with 5 refills Smoking cessation was again emphasized

## 2014-10-06 NOTE — Assessment & Plan Note (Signed)
Her Vicodin prescription was refilled

## 2014-10-10 ENCOUNTER — Other Ambulatory Visit (HOSPITAL_BASED_OUTPATIENT_CLINIC_OR_DEPARTMENT_OTHER): Payer: Self-pay

## 2014-10-10 ENCOUNTER — Ambulatory Visit (HOSPITAL_BASED_OUTPATIENT_CLINIC_OR_DEPARTMENT_OTHER): Payer: Self-pay

## 2014-10-10 ENCOUNTER — Telehealth: Payer: Self-pay | Admitting: Internal Medicine

## 2014-10-10 ENCOUNTER — Encounter: Payer: Self-pay | Admitting: Internal Medicine

## 2014-10-10 ENCOUNTER — Ambulatory Visit (HOSPITAL_BASED_OUTPATIENT_CLINIC_OR_DEPARTMENT_OTHER): Payer: Self-pay | Admitting: Internal Medicine

## 2014-10-10 VITALS — BP 155/97 | HR 106 | Temp 98.6°F | Resp 20 | Ht 65.0 in | Wt 186.5 lb

## 2014-10-10 DIAGNOSIS — C3411 Malignant neoplasm of upper lobe, right bronchus or lung: Secondary | ICD-10-CM

## 2014-10-10 DIAGNOSIS — C3412 Malignant neoplasm of upper lobe, left bronchus or lung: Secondary | ICD-10-CM

## 2014-10-10 DIAGNOSIS — C3490 Malignant neoplasm of unspecified part of unspecified bronchus or lung: Secondary | ICD-10-CM

## 2014-10-10 DIAGNOSIS — Z5111 Encounter for antineoplastic chemotherapy: Secondary | ICD-10-CM

## 2014-10-10 LAB — COMPREHENSIVE METABOLIC PANEL (CC13)
ALT: 16 U/L (ref 0–55)
AST: 16 U/L (ref 5–34)
Albumin: 3.8 g/dL (ref 3.5–5.0)
Alkaline Phosphatase: 124 U/L (ref 40–150)
Anion Gap: 13 mEq/L — ABNORMAL HIGH (ref 3–11)
BUN: 11.2 mg/dL (ref 7.0–26.0)
CO2: 22 mEq/L (ref 22–29)
CREATININE: 0.8 mg/dL (ref 0.6–1.1)
Calcium: 9.6 mg/dL (ref 8.4–10.4)
Chloride: 106 mEq/L (ref 98–109)
EGFR: 76 mL/min/{1.73_m2} — ABNORMAL LOW (ref >90)
Glucose: 149 mg/dl — ABNORMAL HIGH (ref 70–140)
Potassium: 4.4 mEq/L (ref 3.5–5.1)
Sodium: 141 mEq/L (ref 136–145)
Total Protein: 7.3 g/dL (ref 6.4–8.3)

## 2014-10-10 LAB — CBC WITH DIFFERENTIAL/PLATELET
BASO%: 0.8 % (ref 0.0–2.0)
BASOS ABS: 0.1 10*3/uL (ref 0.0–0.1)
EOS%: 0.4 % (ref 0.0–7.0)
Eosinophils Absolute: 0 10*3/uL (ref 0.0–0.5)
HCT: 37.3 % (ref 34.8–46.6)
HEMOGLOBIN: 12.1 g/dL (ref 11.6–15.9)
LYMPH%: 15.4 % (ref 14.0–49.7)
MCH: 30.8 pg (ref 25.1–34.0)
MCHC: 32.3 g/dL (ref 31.5–36.0)
MCV: 95.2 fL (ref 79.5–101.0)
MONO#: 1 10*3/uL — ABNORMAL HIGH (ref 0.1–0.9)
MONO%: 9.5 % (ref 0.0–14.0)
NEUT#: 7.6 10*3/uL — ABNORMAL HIGH (ref 1.5–6.5)
NEUT%: 73.9 % (ref 38.4–76.8)
PLATELETS: 451 10*3/uL — AB (ref 145–400)
RBC: 3.92 10*6/uL (ref 3.70–5.45)
RDW: 15.8 % — ABNORMAL HIGH (ref 11.2–14.5)
WBC: 10.3 10*3/uL (ref 3.9–10.3)
lymph#: 1.6 10*3/uL (ref 0.9–3.3)

## 2014-10-10 LAB — TECHNOLOGIST REVIEW

## 2014-10-10 MED ORDER — CYANOCOBALAMIN 1000 MCG/ML IJ SOLN
INTRAMUSCULAR | Status: AC
  Filled 2014-10-10: qty 1

## 2014-10-10 MED ORDER — HEPARIN SOD (PORK) LOCK FLUSH 100 UNIT/ML IV SOLN
500.0000 [IU] | Freq: Once | INTRAVENOUS | Status: AC | PRN
  Administered 2014-10-10: 500 [IU]
  Filled 2014-10-10: qty 5

## 2014-10-10 MED ORDER — CYANOCOBALAMIN 1000 MCG/ML IJ SOLN
1000.0000 ug | Freq: Once | INTRAMUSCULAR | Status: AC
  Administered 2014-10-10: 1000 ug via INTRAMUSCULAR

## 2014-10-10 MED ORDER — SODIUM CHLORIDE 0.9 % IJ SOLN
10.0000 mL | INTRAMUSCULAR | Status: DC | PRN
  Administered 2014-10-10: 10 mL
  Filled 2014-10-10: qty 10

## 2014-10-10 MED ORDER — SODIUM CHLORIDE 0.9 % IV SOLN
556.5000 mg | Freq: Once | INTRAVENOUS | Status: AC
  Administered 2014-10-10: 560 mg via INTRAVENOUS
  Filled 2014-10-10: qty 56

## 2014-10-10 MED ORDER — SODIUM CHLORIDE 0.9 % IV SOLN
Freq: Once | INTRAVENOUS | Status: AC
  Administered 2014-10-10: 14:00:00 via INTRAVENOUS

## 2014-10-10 MED ORDER — PEMETREXED DISODIUM CHEMO INJECTION 500 MG
490.0000 mg/m2 | Freq: Once | INTRAVENOUS | Status: AC
  Administered 2014-10-10: 900 mg via INTRAVENOUS
  Filled 2014-10-10: qty 36

## 2014-10-10 MED ORDER — SODIUM CHLORIDE 0.9 % IV SOLN
Freq: Once | INTRAVENOUS | Status: AC
  Administered 2014-10-10: 14:00:00 via INTRAVENOUS
  Filled 2014-10-10: qty 8

## 2014-10-10 NOTE — Telephone Encounter (Signed)
gv and printed appt sched and avs for pt for March and April...sed added tx. °

## 2014-10-10 NOTE — Patient Instructions (Signed)
[facility] Discharge Instructions for Patients Receiving Chemotherapy  Today you received the following chemotherapy agents Alimta/Carbo  To help prevent nausea and vomiting after your treatment, we encourage you to take your nausea medication as directed/prescribed   If you develop nausea and vomiting that is not controlled by your nausea medication, call the clinic.   BELOW ARE SYMPTOMS THAT SHOULD BE REPORTED IMMEDIATELY:  *FEVER GREATER THAN 100.5 F  *CHILLS WITH OR WITHOUT FEVER  NAUSEA AND VOMITING THAT IS NOT CONTROLLED WITH YOUR NAUSEA MEDICATION  *UNUSUAL SHORTNESS OF BREATH  *UNUSUAL BRUISING OR BLEEDING  TENDERNESS IN MOUTH AND THROAT WITH OR WITHOUT PRESENCE OF ULCERS  *URINARY PROBLEMS  *BOWEL PROBLEMS  UNUSUAL RASH Items with * indicate a potential emergency and should be followed up as soon as possible.  Feel free to call the clinic you have any questions or concerns. The clinic phone number is [phone].

## 2014-10-10 NOTE — Progress Notes (Signed)
[place] [phone]   [phone]  OFFICE PROGRESS NOTE  No PCP Per Patient No address on file  DIAGNOSIS: stage IV (T2a, N0, M1a) non-small cell lung cancer, adenocarcinoma presented with bilateral pulmonary lesions. This could be also consider as synchronous primary tumors.  PRIOR THERAPY: None.  CURRENT THERAPY: Systemic chemotherapy with carboplatin for AUC of 5 and Alimta 500 MG/M2 every 3 weeks. Status post 2 cycles  INTERVAL HISTORY: Heather Banks 64 y.o. female returns to the clinic today for follow-up visit accompanied by her friend. The patient tolerated the second cycle of her treatment fairly well with no significant adverse effects. She denied having any significant weight loss or night sweats. She denied having any significant nausea or vomiting but has some bloating and gas production. She is currently on melphalan and gas-x. She has no fever or chills. The patient denied having any significant chest pain, shortness breath, cough or hemoptysis. She is here today to start cycle #3 of her chemotherapy.  MEDICAL HISTORY: Past Medical History  Diagnosis Date  . COPD (chronic obstructive pulmonary disease)     Albuterol neb as needed;Pulmicort neb daily  . GERD (gastroesophageal reflux disease)     takes Pantoprazole and Zantac daily  . Lung mass   . Pneumonia     hx of-last time about 4+yrs ago  . History of bronchitis     >5 yrs ago  . History of migraine     last one 2 wks ago  . Panic attacks     but doesn't take any meds    ALLERGIES:  has No Known Allergies.  MEDICATIONS:  Current Outpatient Prescriptions  Medication Sig Dispense Refill  . albuterol (PROVENTIL) (2.5 MG/3ML) 0.083% nebulizer solution Take 3 mLs (2.5 mg total) by nebulization every 6 (six) hours as needed for wheezing or shortness of breath. 75 mL 12  . ALPRAZolam (XANAX) 0.5 MG tablet Take 1 tablet (0.5 mg total) by mouth at bedtime as needed for  anxiety. (Patient taking differently: Take 0.5 mg by mouth at bedtime. ) 60 tablet 0  . dexamethasone (DECADRON) 4 MG tablet 4 mg by mouth twice a day the day before, day of and day after the chemotherapy every 3 weeks 40 tablet 1  . Fluticasone Furoate-Vilanterol 100-25 MCG/INH AEPB Inhale 1 puff into the lungs daily. 1 each 5  . folic acid (FOLVITE) 1 MG tablet Take 1 tablet (1 mg total) by mouth daily. 30 tablet 4  . HYDROcodone-acetaminophen (NORCO/VICODIN) 5-325 MG per tablet Take 1 tablet by mouth every 6 (six) hours as needed for moderate pain or severe pain. 30 tablet 0  . lidocaine-prilocaine (EMLA) cream Apply 1 application topically as needed. (Patient taking differently: Apply 1 application topically as needed (For port-a-cath.). ) 30 g 1  . mirtazapine (REMERON) 30 MG tablet Take 1 tablet (30 mg total) by mouth at bedtime. 30 tablet 2  . Multiple Vitamins-Minerals (MULTIVITAMIN WITH MINERALS) tablet Take 1 tablet by mouth daily.    . nicotine (NICODERM CQ) 21 mg/24hr patch Place 1 patch (21 mg total) onto the skin daily. 28 patch 0  . omeprazole (PRILOSEC) 20 MG capsule Take 1 capsule (20 mg total) by mouth 2 (two) times daily before a meal. 60 capsule 1  . polyethylene glycol (MIRALAX / GLYCOLAX) packet Take 17 g by mouth daily. Takes 2 caps every night    . PRESCRIPTION MEDICATION She receives her treatments at the [facility]  [place]. Her oncologist is Dr. Julien Nordmann. She received Alimta 925mg  and Carboplatin 560mg  on 08/30/14.    Marland Kitchen aspirin-acetaminophen-caffeine (EXCEDRIN MIGRAINE) 250-250-65 MG per tablet Take 2 tablets by mouth every 6 (six) hours as needed for headache.    . prochlorperazine (COMPAZINE) 10 MG tablet Take 1 tablet (10 mg total) by mouth every 6 (six) hours as needed for nausea or vomiting. (Patient not taking: Reported on 10/10/2014) 60 tablet 0   No current facility-administered medications for this visit.    SURGICAL HISTORY:  Past Surgical History    Procedure Laterality Date  . Abdominal hysterectomy    . Oophorectomy    . Appendectomy    . Tonsillectomy    . Video bronchoscopy with endobronchial navigation N/A 08/14/2014    Procedure: VIDEO BRONCHOSCOPY WITH ENDOBRONCHIAL NAVIGATION;  Surgeon: Rigoberto Noel, MD;  Location: [place];  Service: Thoracic;  Laterality: N/A;  . Portacath placement  jan. 2016    REVIEW OF SYSTEMS:  A comprehensive review of systems was negative except for: Constitutional: positive for fatigue Gastrointestinal: positive for reflux symptoms   PHYSICAL EXAMINATION: General appearance: alert, cooperative and no distress Head: Normocephalic, without obvious abnormality, atraumatic Neck: no adenopathy, no JVD, supple, symmetrical, trachea midline and thyroid not enlarged, symmetric, no tenderness/mass/nodules Lymph nodes: Cervical, supraclavicular, and axillary nodes normal. Resp: clear to auscultation bilaterally Back: symmetric, no curvature. ROM normal. No CVA tenderness. Cardio: regular rate and rhythm, S1, S2 normal, no murmur, click, rub or gallop GI: soft, non-tender; bowel sounds normal; no masses,  no organomegaly Extremities: extremities normal, atraumatic, no cyanosis or edema  ECOG PERFORMANCE STATUS: 1 - Symptomatic but completely ambulatory  Blood pressure 155/97, pulse 106, temperature 98.6 F (37 C), temperature source Oral, resp. rate 20, height 5\' 5"  (1.651 m), weight 186 lb 8 oz (84.596 kg).  LABORATORY DATA: Lab Results  Component Value Date   WBC 10.3 10/10/2014   HGB 12.1 10/10/2014   HCT 37.3 10/10/2014   MCV 95.2 10/10/2014   PLT 451* 10/10/2014      Chemistry      Component Value Date/Time   NA 142 10/02/2014 1041   NA 138 08/11/2014 0837   K 4.4 10/02/2014 1041   K 3.8 08/11/2014 0837   CL 103 08/11/2014 0837   CO2 25 10/02/2014 1041   CO2 23 08/11/2014 0837   BUN 8.9 10/02/2014 1041   BUN 8 08/11/2014 0837   CREATININE 0.7 10/02/2014 1041   CREATININE 0.57  08/11/2014 0837      Component Value Date/Time   CALCIUM 9.5 10/02/2014 1041   CALCIUM 9.0 08/11/2014 0837   ALKPHOS 133 10/02/2014 1041   ALKPHOS 145* 07/10/2014 0459   AST 20 10/02/2014 1041   AST 11 07/10/2014 0459   ALT 25 10/02/2014 1041   ALT 8 07/10/2014 0459   BILITOT <0.20 10/02/2014 1041   BILITOT <0.2* 07/10/2014 0459       RADIOGRAPHIC STUDIES: No results found.  ASSESSMENT AND PLAN: This is a very pleasant 64 years old white female with stage IV non-small cell lung cancer currently undergoing systemic chemotherapy with carboplatin and Alimta status post 2 cycles. The patient tolerated the last cycle of her treatment fairly well. I recommended for her to proceed with cycle #3 today as a scheduled For depression, I will start the patient on Remeron 30 mg by mouth daily at bedtime. For the acid reflux, I will start the patient on Prilosec 20 mg by mouth twice a day. She  will come back for follow-up visit in 3 weeks with the next cycle of her treatment after repeating CT scan of the chest, abdomen and pelvis for restaging of her disease. She was advised to call immediately if she has any concerning symptoms in the interval. The patient voices understanding of current disease status and treatment options and is in agreement with the current care plan.  All questions were answered. The patient knows to call the clinic with any problems, questions or concerns. We can certainly see the patient much sooner if necessary.  Disclaimer: This note was dictated with voice recognition software. Similar sounding words can inadvertently be transcribed and may not be corrected upon review.

## 2014-10-17 ENCOUNTER — Other Ambulatory Visit (HOSPITAL_BASED_OUTPATIENT_CLINIC_OR_DEPARTMENT_OTHER): Payer: Self-pay

## 2014-10-17 DIAGNOSIS — C3412 Malignant neoplasm of upper lobe, left bronchus or lung: Secondary | ICD-10-CM

## 2014-10-17 DIAGNOSIS — C3490 Malignant neoplasm of unspecified part of unspecified bronchus or lung: Secondary | ICD-10-CM

## 2014-10-17 DIAGNOSIS — C3411 Malignant neoplasm of upper lobe, right bronchus or lung: Secondary | ICD-10-CM

## 2014-10-17 LAB — COMPREHENSIVE METABOLIC PANEL (CC13)
ALBUMIN: 3.6 g/dL (ref 3.5–5.0)
ALT: 41 U/L (ref 0–55)
AST: 27 U/L (ref 5–34)
Alkaline Phosphatase: 111 U/L (ref 40–150)
Anion Gap: 12 mEq/L — ABNORMAL HIGH (ref 3–11)
BUN: 17.9 mg/dL (ref 7.0–26.0)
CALCIUM: 9.3 mg/dL (ref 8.4–10.4)
CO2: 25 meq/L (ref 22–29)
Chloride: 103 mEq/L (ref 98–109)
Creatinine: 0.6 mg/dL (ref 0.6–1.1)
EGFR: >90 mL/min/{1.73_m2} (ref >90)
GLUCOSE: 89 mg/dL (ref 70–140)
Potassium: 4.1 mEq/L (ref 3.5–5.1)
Sodium: 141 mEq/L (ref 136–145)
TOTAL PROTEIN: 7 g/dL (ref 6.4–8.3)
Total Bilirubin: 0.2 mg/dL (ref 0.20–1.20)

## 2014-10-17 LAB — CBC WITH DIFFERENTIAL/PLATELET
BASO%: 0.3 % (ref 0.0–2.0)
Basophils Absolute: 0 10*3/uL (ref 0.0–0.1)
EOS%: 1.9 % (ref 0.0–7.0)
Eosinophils Absolute: 0.1 10*3/uL (ref 0.0–0.5)
HCT: 37.2 % (ref 34.8–46.6)
HGB: 12.2 g/dL (ref 11.6–15.9)
LYMPH%: 44.3 % (ref 14.0–49.7)
MCH: 31.4 pg (ref 25.1–34.0)
MCHC: 32.8 g/dL (ref 31.5–36.0)
MCV: 95.6 fL (ref 79.5–101.0)
MONO#: 0.4 10*3/uL (ref 0.1–0.9)
MONO%: 11.4 % (ref 0.0–14.0)
NEUT#: 1.6 10*3/uL (ref 1.5–6.5)
NEUT%: 42.1 % (ref 38.4–76.8)
PLATELETS: 178 10*3/uL (ref 145–400)
RBC: 3.89 10*6/uL (ref 3.70–5.45)
RDW: 15.4 % — AB (ref 11.2–14.5)
WBC: 3.8 10*3/uL — ABNORMAL LOW (ref 3.9–10.3)
lymph#: 1.7 10*3/uL (ref 0.9–3.3)

## 2014-10-24 ENCOUNTER — Other Ambulatory Visit (HOSPITAL_BASED_OUTPATIENT_CLINIC_OR_DEPARTMENT_OTHER): Payer: Self-pay

## 2014-10-24 DIAGNOSIS — C3490 Malignant neoplasm of unspecified part of unspecified bronchus or lung: Secondary | ICD-10-CM

## 2014-10-24 DIAGNOSIS — C3411 Malignant neoplasm of upper lobe, right bronchus or lung: Secondary | ICD-10-CM

## 2014-10-24 DIAGNOSIS — C3412 Malignant neoplasm of upper lobe, left bronchus or lung: Secondary | ICD-10-CM

## 2014-10-24 LAB — CBC WITH DIFFERENTIAL/PLATELET
BASO%: 0.3 % (ref 0.0–2.0)
BASOS ABS: 0 10*3/uL (ref 0.0–0.1)
EOS%: 0.9 % (ref 0.0–7.0)
Eosinophils Absolute: 0.1 10*3/uL (ref 0.0–0.5)
HEMATOCRIT: 35 % (ref 34.8–46.6)
HGB: 11.5 g/dL — ABNORMAL LOW (ref 11.6–15.9)
LYMPH%: 28.8 % (ref 14.0–49.7)
MCH: 31.9 pg (ref 25.1–34.0)
MCHC: 32.9 g/dL (ref 31.5–36.0)
MCV: 97 fL (ref 79.5–101.0)
MONO#: 1 10*3/uL — ABNORMAL HIGH (ref 0.1–0.9)
MONO%: 12.9 % (ref 0.0–14.0)
NEUT#: 4.3 10*3/uL (ref 1.5–6.5)
NEUT%: 57.1 % (ref 38.4–76.8)
PLATELETS: 180 10*3/uL (ref 145–400)
RBC: 3.61 10*6/uL — ABNORMAL LOW (ref 3.70–5.45)
RDW: 16.2 % — ABNORMAL HIGH (ref 11.2–14.5)
WBC: 7.5 10*3/uL (ref 3.9–10.3)
lymph#: 2.2 10*3/uL (ref 0.9–3.3)

## 2014-10-24 LAB — COMPREHENSIVE METABOLIC PANEL (CC13)
ALT: 26 U/L (ref 0–55)
ANION GAP: 11 meq/L (ref 3–11)
AST: 20 U/L (ref 5–34)
Albumin: 3.6 g/dL (ref 3.5–5.0)
Alkaline Phosphatase: 121 U/L (ref 40–150)
BUN: 11.3 mg/dL (ref 7.0–26.0)
CO2: 25 mEq/L (ref 22–29)
Calcium: 9.8 mg/dL (ref 8.4–10.4)
Chloride: 104 mEq/L (ref 98–109)
Creatinine: 0.6 mg/dL (ref 0.6–1.1)
EGFR: >90 mL/min/{1.73_m2} (ref >90)
Glucose: 104 mg/dl (ref 70–140)
Potassium: 4.6 mEq/L (ref 3.5–5.1)
Sodium: 141 mEq/L (ref 136–145)
Total Bilirubin: <0.2 mg/dL (ref 0.20–1.20)
Total Protein: 7.1 g/dL (ref 6.4–8.3)

## 2014-10-30 ENCOUNTER — Encounter (HOSPITAL_COMMUNITY): Payer: Self-pay

## 2014-10-30 ENCOUNTER — Ambulatory Visit (HOSPITAL_COMMUNITY)
Admission: RE | Admit: 2014-10-30 | Discharge: 2014-10-30 | Disposition: A | Discharge status: Home / Self Care | Payer: Medicaid - Out of State | Source: Ambulatory Visit | Attending: Internal Medicine | Admitting: Internal Medicine

## 2014-10-30 DIAGNOSIS — Z79899 Other long term (current) drug therapy: Secondary | ICD-10-CM | POA: Insufficient documentation

## 2014-10-30 DIAGNOSIS — C3490 Malignant neoplasm of unspecified part of unspecified bronchus or lung: Secondary | ICD-10-CM | POA: Insufficient documentation

## 2014-10-30 MED ORDER — IOHEXOL 300 MG/ML  SOLN
100.0000 mL | Freq: Once | INTRAMUSCULAR | Status: AC | PRN
  Administered 2014-10-30: 100 mL via INTRAVENOUS

## 2014-10-31 ENCOUNTER — Ambulatory Visit (HOSPITAL_BASED_OUTPATIENT_CLINIC_OR_DEPARTMENT_OTHER): Payer: Self-pay | Admitting: Physician Assistant

## 2014-10-31 ENCOUNTER — Telehealth: Payer: Self-pay | Admitting: Physician Assistant

## 2014-10-31 ENCOUNTER — Telehealth: Payer: Self-pay | Admitting: *Deleted

## 2014-10-31 ENCOUNTER — Other Ambulatory Visit (HOSPITAL_BASED_OUTPATIENT_CLINIC_OR_DEPARTMENT_OTHER): Payer: Self-pay

## 2014-10-31 ENCOUNTER — Encounter: Payer: Self-pay | Admitting: Physician Assistant

## 2014-10-31 ENCOUNTER — Ambulatory Visit (HOSPITAL_BASED_OUTPATIENT_CLINIC_OR_DEPARTMENT_OTHER): Payer: Self-pay

## 2014-10-31 VITALS — BP 136/78 | HR 92 | Temp 98.3°F | Resp 19 | Ht 65.0 in | Wt 192.9 lb

## 2014-10-31 DIAGNOSIS — C3411 Malignant neoplasm of upper lobe, right bronchus or lung: Secondary | ICD-10-CM

## 2014-10-31 DIAGNOSIS — Z5111 Encounter for antineoplastic chemotherapy: Secondary | ICD-10-CM

## 2014-10-31 DIAGNOSIS — C3490 Malignant neoplasm of unspecified part of unspecified bronchus or lung: Secondary | ICD-10-CM

## 2014-10-31 DIAGNOSIS — F329 Major depressive disorder, single episode, unspecified: Secondary | ICD-10-CM

## 2014-10-31 DIAGNOSIS — C3412 Malignant neoplasm of upper lobe, left bronchus or lung: Secondary | ICD-10-CM

## 2014-10-31 DIAGNOSIS — K219 Gastro-esophageal reflux disease without esophagitis: Secondary | ICD-10-CM

## 2014-10-31 DIAGNOSIS — R635 Abnormal weight gain: Secondary | ICD-10-CM

## 2014-10-31 LAB — CBC WITH DIFFERENTIAL/PLATELET
BASO%: 0.1 % (ref 0.0–2.0)
Basophils Absolute: 0 10*3/uL (ref 0.0–0.1)
EOS ABS: 0.1 10*3/uL (ref 0.0–0.5)
EOS%: 0.5 % (ref 0.0–7.0)
HEMATOCRIT: 35.4 % (ref 34.8–46.6)
HGB: 11.5 g/dL — ABNORMAL LOW (ref 11.6–15.9)
LYMPH%: 22.9 % (ref 14.0–49.7)
MCH: 31.7 pg (ref 25.1–34.0)
MCHC: 32.5 g/dL (ref 31.5–36.0)
MCV: 97.5 fL (ref 79.5–101.0)
MONO#: 1.2 10*3/uL — ABNORMAL HIGH (ref 0.1–0.9)
MONO%: 12.4 % (ref 0.0–14.0)
NEUT%: 64.1 % (ref 38.4–76.8)
NEUTROS ABS: 6.4 10*3/uL (ref 1.5–6.5)
PLATELETS: 319 10*3/uL (ref 145–400)
RBC: 3.63 10*6/uL — ABNORMAL LOW (ref 3.70–5.45)
RDW: 16.4 % — ABNORMAL HIGH (ref 11.2–14.5)
WBC: 10 10*3/uL (ref 3.9–10.3)
lymph#: 2.3 10*3/uL (ref 0.9–3.3)

## 2014-10-31 LAB — COMPREHENSIVE METABOLIC PANEL (CC13)
ALBUMIN: 3.6 g/dL (ref 3.5–5.0)
ALT: 18 U/L (ref 0–55)
AST: 15 U/L (ref 5–34)
Alkaline Phosphatase: 115 U/L (ref 40–150)
Anion Gap: 11 mEq/L (ref 3–11)
BUN: 11.9 mg/dL (ref 7.0–26.0)
CALCIUM: 9.2 mg/dL (ref 8.4–10.4)
CO2: 25 mEq/L (ref 22–29)
Chloride: 106 mEq/L (ref 98–109)
Creatinine: 0.7 mg/dL (ref 0.6–1.1)
EGFR: >90 mL/min/{1.73_m2} (ref >90)
GLUCOSE: 138 mg/dL (ref 70–140)
POTASSIUM: 4 meq/L (ref 3.5–5.1)
Sodium: 141 mEq/L (ref 136–145)
Total Bilirubin: <0.2 mg/dL (ref 0.20–1.20)
Total Protein: 6.9 g/dL (ref 6.4–8.3)

## 2014-10-31 MED ORDER — SODIUM CHLORIDE 0.9 % IV SOLN
Freq: Once | INTRAVENOUS | Status: AC
  Administered 2014-10-31: 13:00:00 via INTRAVENOUS
  Filled 2014-10-31: qty 8

## 2014-10-31 MED ORDER — SODIUM CHLORIDE 0.9 % IV SOLN
Freq: Once | INTRAVENOUS | Status: AC
  Administered 2014-10-31: 13:00:00 via INTRAVENOUS

## 2014-10-31 MED ORDER — CARBOPLATIN CHEMO INJECTION 600 MG/60ML
632.0000 mg | Freq: Once | INTRAVENOUS | Status: AC
  Administered 2014-10-31: 630 mg via INTRAVENOUS
  Filled 2014-10-31: qty 63

## 2014-10-31 MED ORDER — SODIUM CHLORIDE 0.9 % IJ SOLN
10.0000 mL | INTRAMUSCULAR | Status: DC | PRN
  Administered 2014-10-31: 10 mL
  Filled 2014-10-31: qty 10

## 2014-10-31 MED ORDER — SODIUM CHLORIDE 0.9 % IV SOLN: 556.5000 mg | Freq: Once | INTRAVENOUS | Status: DC

## 2014-10-31 MED ORDER — HEPARIN SOD (PORK) LOCK FLUSH 100 UNIT/ML IV SOLN
500.0000 [IU] | Freq: Once | INTRAVENOUS | Status: AC | PRN
  Administered 2014-10-31: 500 [IU]
  Filled 2014-10-31: qty 5

## 2014-10-31 MED ORDER — SODIUM CHLORIDE 0.9 % IV SOLN
500.0000 mg/m2 | Freq: Once | INTRAVENOUS | Status: AC
  Administered 2014-10-31: 1000 mg via INTRAVENOUS
  Filled 2014-10-31: qty 40

## 2014-10-31 NOTE — Telephone Encounter (Signed)
Per staff message and POF I have scheduled appts. Advised scheduler of appts. JMW  

## 2014-10-31 NOTE — Progress Notes (Signed)
1458 discharged with daughter-n-law ambulatory in no distress.

## 2014-10-31 NOTE — Progress Notes (Addendum)
[place] Telephone:(336) [phone]   Fax:(336) [phone]  OFFICE PROGRESS NOTE  No PCP Per Patient No address on file  DIAGNOSIS: stage IV (T2a, N0, M1a) non-small cell lung cancer, adenocarcinoma presented with bilateral pulmonary lesions. This could be also consider as synchronous primary tumors.  PRIOR THERAPY: None.  CURRENT THERAPY: Systemic chemotherapy with carboplatin for AUC of 5 and Alimta 500 MG/M2 every 3 weeks. Status post 3 cycles  INTERVAL HISTORY: Heather Banks 64 y.o. female returns to the clinic today for follow-up visit. Overall she's tolerating her chemotherapy relatively well without any significant adverse effects. She does note some fatigue for a few days after chemotherapy. She denied having any significant weight loss or night sweats. She is eating well and "eating healthy". She is actually concerned about her weight gain.   She denied having any significant nausea or vomiting.  She has no fever or chills. The patient denied having any significant chest pain, shortness breath, cough or hemoptysis. She is here today to start cycle #4 of her chemotherapy. She recently had a restaging CT scan of her chest, abdomen and pelvis and presents to discuss results.  MEDICAL HISTORY: Past Medical History  Diagnosis Date  . COPD (chronic obstructive pulmonary disease)     Albuterol neb as needed;Pulmicort neb daily  . GERD (gastroesophageal reflux disease)     takes Pantoprazole and Zantac daily  . Lung mass   . Pneumonia     hx of-last time about 4+yrs ago  . History of bronchitis     >5 yrs ago  . History of migraine     last one 2 wks ago  . Panic attacks     but doesn't take any meds    ALLERGIES:  has No Known Allergies.  MEDICATIONS:  Current Outpatient Prescriptions  Medication Sig Dispense Refill  . albuterol (PROVENTIL) (2.5 MG/3ML) 0.083% nebulizer solution Take 3 mLs (2.5 mg total) by nebulization every 6 (six) hours as needed for  wheezing or shortness of breath. 75 mL 12  . ALPRAZolam (XANAX) 0.5 MG tablet Take 1 tablet (0.5 mg total) by mouth at bedtime as needed for anxiety. (Patient taking differently: Take 0.5 mg by mouth at bedtime. ) 60 tablet 0  . aspirin-acetaminophen-caffeine (EXCEDRIN MIGRAINE) 250-250-65 MG per tablet Take 2 tablets by mouth every 6 (six) hours as needed for headache.    . dexamethasone (DECADRON) 4 MG tablet 4 mg by mouth twice a day the day before, day of and day after the chemotherapy every 3 weeks 40 tablet 1  . Fluticasone Furoate-Vilanterol 100-25 MCG/INH AEPB Inhale 1 puff into the lungs daily. 1 each 5  . folic acid (FOLVITE) 1 MG tablet Take 1 tablet (1 mg total) by mouth daily. 30 tablet 4  . HYDROcodone-acetaminophen (NORCO/VICODIN) 5-325 MG per tablet Take 1 tablet by mouth every 6 (six) hours as needed for moderate pain or severe pain. 30 tablet 0  . lidocaine-prilocaine (EMLA) cream Apply 1 application topically as needed. (Patient taking differently: Apply 1 application topically as needed (For port-a-cath.). ) 30 g 1  . mirtazapine (REMERON) 30 MG tablet Take 1 tablet (30 mg total) by mouth at bedtime. 30 tablet 2  . Multiple Vitamins-Minerals (MULTIVITAMIN WITH MINERALS) tablet Take 1 tablet by mouth daily.    . nicotine (NICODERM CQ) 21 mg/24hr patch Place 1 patch (21 mg total) onto the skin daily. 28 patch 0  . omeprazole (PRILOSEC) 20 MG capsule Take 1 capsule (  20 mg total) by mouth 2 (two) times daily before a meal. 60 capsule 1  . polyethylene glycol (MIRALAX / GLYCOLAX) packet Take 17 g by mouth daily. Takes 2 caps every night    . PRESCRIPTION MEDICATION She receives her treatments at the [facility]. Her oncologist is Dr. Julien Nordmann. She received Alimta 925mg  and Carboplatin 560mg  on 08/30/14.    Marland Kitchen prochlorperazine (COMPAZINE) 10 MG tablet Take 1 tablet (10 mg total) by mouth every 6 (six) hours as needed for nausea or vomiting. (Patient not taking: Reported  on 10/10/2014) 60 tablet 0   No current facility-administered medications for this visit.   Facility-Administered Medications Ordered in Other Visits  Medication Dose Route Frequency Provider Last Rate Last Dose  . sodium chloride 0.9 % injection 10 mL  10 mL Intracatheter PRN Curt Bears, MD   10 mL at 10/31/14 1454    SURGICAL HISTORY:  Past Surgical History  Procedure Laterality Date  . Abdominal hysterectomy    . Oophorectomy    . Appendectomy    . Tonsillectomy    . Video bronchoscopy with endobronchial navigation N/A 08/14/2014    Procedure: VIDEO BRONCHOSCOPY WITH ENDOBRONCHIAL NAVIGATION;  Surgeon: Rigoberto Noel, MD;  Location: [place];  Service: Thoracic;  Laterality: N/A;  . Portacath placement  jan. 2016    REVIEW OF SYSTEMS:  A comprehensive review of systems was negative except for: Constitutional: positive for fatigue and Weight gain Gastrointestinal: positive for reflux symptoms   PHYSICAL EXAMINATION: General appearance: alert, cooperative and no distress Head: Normocephalic, without obvious abnormality, atraumatic Neck: no adenopathy, no JVD, supple, symmetrical, trachea midline and thyroid not enlarged, symmetric, no tenderness/mass/nodules Lymph nodes: Cervical, supraclavicular, and axillary nodes normal. Resp: clear to auscultation bilaterally Back: symmetric, no curvature. ROM normal. No CVA tenderness. Cardio: regular rate and rhythm, S1, S2 normal, no murmur, click, rub or gallop GI: soft, non-tender; bowel sounds normal; no masses,  no organomegaly Extremities: extremities normal, atraumatic, no cyanosis or edema  ECOG PERFORMANCE STATUS: 1 - Symptomatic but completely ambulatory  Blood pressure 136/78, pulse 92, temperature 98.3 F (36.8 C), temperature source Oral, resp. rate 19, height 5\' 5"  (1.651 m), weight 192 lb 14.4 oz (87.499 kg), SpO2 99 %.  LABORATORY DATA: Lab Results  Component Value Date   WBC 10.0 10/31/2014   HGB 11.5* 10/31/2014    HCT 35.4 10/31/2014   MCV 97.5 10/31/2014   PLT 319 10/31/2014      Chemistry      Component Value Date/Time   NA 141 10/31/2014 1017   NA 138 08/11/2014 0837   K 4.0 10/31/2014 1017   K 3.8 08/11/2014 0837   CL 103 08/11/2014 0837   CO2 25 10/31/2014 1017   CO2 23 08/11/2014 0837   BUN 11.9 10/31/2014 1017   BUN 8 08/11/2014 0837   CREATININE 0.7 10/31/2014 1017   CREATININE 0.57 08/11/2014 0837      Component Value Date/Time   CALCIUM 9.2 10/31/2014 1017   CALCIUM 9.0 08/11/2014 0837   ALKPHOS 115 10/31/2014 1017   ALKPHOS 145* 07/10/2014 0459   AST 15 10/31/2014 1017   AST 11 07/10/2014 0459   ALT 18 10/31/2014 1017   ALT 8 07/10/2014 0459   BILITOT <0.20 10/31/2014 1017   BILITOT <0.2* 07/10/2014 0459       RADIOGRAPHIC STUDIES: Ct Chest W Contrast  10/30/2014   CLINICAL DATA:  Followup lung cancer.  Chemotherapy on going.  EXAM: CT CHEST, ABDOMEN,  AND PELVIS WITH CONTRAST  TECHNIQUE: Multidetector CT imaging of the chest, abdomen and pelvis was performed following the standard protocol during bolus administration of intravenous contrast.  CONTRAST:  152mL OMNIPAQUE IOHEXOL 300 MG/ML  SOLN  COMPARISON:  PET-CT 07/31/2014  FINDINGS: CT CHEST FINDINGS  Mediastinum: Normal heart size. There is no pericardial effusion identified. Calcified atherosclerotic disease involves the thoracic aorta as well as the LAD, left circumflex and RCA Coronary arteries. No mediastinal or hilar adenopathy identified.  Lungs/Pleura: There is no pleural effusion identified. Index mass within the left upper lobe measures 3.9 x 3.4 cm, image 11/ series 5. Previously 3.7 x 3.5 cm. Right upper lobe pulmonary nodule measures 1.8 x 1.5 cm, image 16/series 5. Previously 2.3 x 1.8 cm. Anterior right middle lobe cystic lesion is again identified measuring 2.8 x 2.5 cm, image 23/ series 5. Previously 2.9 x 2.5 cm.  Musculoskeletal: There are no aggressive lytic or sclerotic bone lesions identified.  CT ABDOMEN  AND PELVIS FINDINGS  Hepatobiliary: No suspicious liver abnormality identified. There is mild hepatic steatosis. The gallbladder appears normal. No biliary dilatation.  Pancreas: Normal appearance of the pancreas.  Spleen: The spleen is negative.  Adrenals/Urinary Tract: Normal adrenal glands. Right renal cysts are again noted. Normal appearance of the left kidney. The urinary bladder appears normal.  Stomach/Bowel: The stomach and the small bowel loops have a normal course and caliber without obstruction. Normal appearance of the colon.  Vascular/Lymphatic: There is calcified atherosclerotic disease involving the abdominal aorta. The aorta measures 2.6 cm in maximum AP dimension. No enlarged retroperitoneal or mesenteric adenopathy. No enlarged pelvic or inguinal lymph nodes.  Reproductive: Previous hysterectomy.  No adnexal mass noted.  Other: There is no ascites or focal fluid collections within the abdomen or pelvis.  Musculoskeletal: Review of the visualized osseous structures is negative for aggressive lytic or sclerotic bone lesion.  IMPRESSION: 1. Mixed response to therapy. Mass in the left upper lobe is not significantly improved in the interval. 2. The mass in the left upper lobe is not significantly changed in size compared with previous exam. The right upper lobe lesion however a has decreased in size in the interval. 3. Indeterminate cystic structure within the anterior right upper lobe is stable from previous exam and may represent a benign abnormality. 4. No new or progressive disease identified within the chest abdomen or pelvis. 5. Atherosclerotic disease including 3 vessel coronary artery calcification. 6. Ectatic abdominal aorta at risk for aneurysm development. Recommend followup by ultrasound in 5 years. This recommendation follows ACR consensus guidelines: White Paper of the ACR Incidental Findings Committee II on Vascular Findings. J Am Coll Radiol 2013; 10:789-794.   Electronically Signed    By: Kerby Moors M.D.   On: 10/30/2014 16:47   Ct Abdomen Pelvis W Contrast  10/30/2014   CLINICAL DATA:  Followup lung cancer.  Chemotherapy on going.  EXAM: CT CHEST, ABDOMEN, AND PELVIS WITH CONTRAST  TECHNIQUE: Multidetector CT imaging of the chest, abdomen and pelvis was performed following the standard protocol during bolus administration of intravenous contrast.  CONTRAST:  181mL OMNIPAQUE IOHEXOL 300 MG/ML  SOLN  COMPARISON:  PET-CT 07/31/2014  FINDINGS: CT CHEST FINDINGS  Mediastinum: Normal heart size. There is no pericardial effusion identified. Calcified atherosclerotic disease involves the thoracic aorta as well as the LAD, left circumflex and RCA Coronary arteries. No mediastinal or hilar adenopathy identified.  Lungs/Pleura: There is no pleural effusion identified. Index mass within the left upper lobe measures 3.9 x  3.4 cm, image 11/ series 5. Previously 3.7 x 3.5 cm. Right upper lobe pulmonary nodule measures 1.8 x 1.5 cm, image 16/series 5. Previously 2.3 x 1.8 cm. Anterior right middle lobe cystic lesion is again identified measuring 2.8 x 2.5 cm, image 23/ series 5. Previously 2.9 x 2.5 cm.  Musculoskeletal: There are no aggressive lytic or sclerotic bone lesions identified.  CT ABDOMEN AND PELVIS FINDINGS  Hepatobiliary: No suspicious liver abnormality identified. There is mild hepatic steatosis. The gallbladder appears normal. No biliary dilatation.  Pancreas: Normal appearance of the pancreas.  Spleen: The spleen is negative.  Adrenals/Urinary Tract: Normal adrenal glands. Right renal cysts are again noted. Normal appearance of the left kidney. The urinary bladder appears normal.  Stomach/Bowel: The stomach and the small bowel loops have a normal course and caliber without obstruction. Normal appearance of the colon.  Vascular/Lymphatic: There is calcified atherosclerotic disease involving the abdominal aorta. The aorta measures 2.6 cm in maximum AP dimension. No enlarged retroperitoneal  or mesenteric adenopathy. No enlarged pelvic or inguinal lymph nodes.  Reproductive: Previous hysterectomy.  No adnexal mass noted.  Other: There is no ascites or focal fluid collections within the abdomen or pelvis.  Musculoskeletal: Review of the visualized osseous structures is negative for aggressive lytic or sclerotic bone lesion.  IMPRESSION: 1. Mixed response to therapy. Mass in the left upper lobe is not significantly improved in the interval. 2. The mass in the left upper lobe is not significantly changed in size compared with previous exam. The right upper lobe lesion however a has decreased in size in the interval. 3. Indeterminate cystic structure within the anterior right upper lobe is stable from previous exam and may represent a benign abnormality. 4. No new or progressive disease identified within the chest abdomen or pelvis. 5. Atherosclerotic disease including 3 vessel coronary artery calcification. 6. Ectatic abdominal aorta at risk for aneurysm development. Recommend followup by ultrasound in 5 years. This recommendation follows ACR consensus guidelines: White Paper of the ACR Incidental Findings Committee II on Vascular Findings. J Am Coll Radiol 2013; 10:789-794.   Electronically Signed   By: Kerby Moors M.D.   On: 10/30/2014 16:47    ASSESSMENT AND PLAN: This is a very pleasant 64 years old white female with stage IV non-small cell lung cancer currently undergoing systemic chemotherapy with carboplatin and Alimta status post 3 cycles. The patient tolerated the last cycle of her treatment fairly well. Her recent restaging CT scan revealed interval decrease in the size of her right upper lobe lesion with no new or progressive disease identified in the chest abdomen or pelvis. These results were reviewed with patient. The patient was discussed with and also seen by Dr. Julien Nordmann. She will proceed with cycle #4 of her systemic chemotherapy with carboplatin and Alimta today as scheduled. She  will continue with weekly labs as previously scheduled. She will return in 3 weeks prior to the start of cycle #5. I recommended for her to proceed with cycle #3 today as a scheduled For depression, she will continue on Remeron 30 mg by mouth daily at bedtime. For the acid reflux, she will continue on Prilosec 20 mg by mouth twice a day.  She was advised to call immediately if she has any concerning symptoms in the interval. The patient voices understanding of current disease status and treatment options and is in agreement with the current care plan.  All questions were answered. The patient knows to call the clinic with any problems,  questions or concerns. We can certainly see the patient much sooner if necessary.  Carlton Adam, PA-C 10/31/2014  ADDENDUM: Hematology/Oncology Attending:  I had a face to face encounter with the patient. I recommended her care plan. This is a very pleasant 64 years old white female with metastatic non-small cell lung cancer, adenocarcinoma. She is currently undergoing systemic chemotherapy with carbo platinum and Alimta status post 3 cycles. The patient tolerated her treatment fairly well with no significant adverse effects. The recent CT scan of the chest, abdomen and pelvis showed mixed response with mild increase in the size of the left upper lobe nodule and mild decrease on the right upper lobe nodule. I discussed the scan results and showed the images to the patient and her daughter. I recommended for her to continue with her current treatment with carboplatin and Alimta for now. We will monitor the enlarging pulmonary nodule closely on the upcoming scan and may consider the patient for radiotherapy to this lesion if it continues to increase in size. For depression the patient will continue on Remeron. The patient would come back for follow-up visit in 3 weeks for reevaluation before starting cycle #5. She was advised to call immediately if she has any  concerning symptoms in the interval.  Disclaimer: This note was dictated with voice recognition software. Similar sounding words can inadvertently be transcribed and may not be corrected upon review. [name]., MD 11/05/2014

## 2014-10-31 NOTE — Patient Instructions (Signed)
[facility] Discharge Instructions for Patients Receiving Chemotherapy  Today you received the following chemotherapy agents Alimta, Carboplatin  To help prevent nausea and vomiting after your treatment, we encourage you to take your nausea medication Compazine 10 mg as ordered by Dr. Julien Nordmann.   If you develop nausea and vomiting that is not controlled by your nausea medication, call the clinic.   BELOW ARE SYMPTOMS THAT SHOULD BE REPORTED IMMEDIATELY:  *FEVER GREATER THAN 100.5 F  *CHILLS WITH OR WITHOUT FEVER  NAUSEA AND VOMITING THAT IS NOT CONTROLLED WITH YOUR NAUSEA MEDICATION  *UNUSUAL SHORTNESS OF BREATH  *UNUSUAL BRUISING OR BLEEDING  TENDERNESS IN MOUTH AND THROAT WITH OR WITHOUT PRESENCE OF ULCERS  *URINARY PROBLEMS  *BOWEL PROBLEMS  UNUSUAL RASH Items with * indicate a potential emergency and should be followed up as soon as possible.  Feel free to call the clinic you have any questions or concerns. The clinic phone number is [phone].  Please show the [place] at check-in to the Emergency Department and triage nurse.

## 2014-10-31 NOTE — Telephone Encounter (Signed)
Gave avs & calendar for April. Sent message to adjust treatment.

## 2014-11-03 NOTE — Patient Instructions (Signed)
Your recent restaging CT scan showed decrease in the size of the right upper lobe lesion with no new or progressive disease within your chest abdomen or pelvis Continue weekly labs as scheduled Follow-up in 3 weeks prior to the start of your next scheduled cycle of chemotherapy

## 2014-11-07 ENCOUNTER — Other Ambulatory Visit (HOSPITAL_BASED_OUTPATIENT_CLINIC_OR_DEPARTMENT_OTHER): Payer: Self-pay

## 2014-11-07 DIAGNOSIS — C3490 Malignant neoplasm of unspecified part of unspecified bronchus or lung: Secondary | ICD-10-CM

## 2014-11-07 DIAGNOSIS — C3411 Malignant neoplasm of upper lobe, right bronchus or lung: Secondary | ICD-10-CM

## 2014-11-07 DIAGNOSIS — C3412 Malignant neoplasm of upper lobe, left bronchus or lung: Secondary | ICD-10-CM

## 2014-11-07 LAB — COMPREHENSIVE METABOLIC PANEL (CC13)
ALBUMIN: 3.6 g/dL (ref 3.5–5.0)
ALT: 32 U/L (ref 0–55)
AST: 24 U/L (ref 5–34)
Alkaline Phosphatase: 112 U/L (ref 40–150)
Anion Gap: 11 mEq/L (ref 3–11)
BILIRUBIN TOTAL: 0.25 mg/dL (ref 0.20–1.20)
BUN: 14.5 mg/dL (ref 7.0–26.0)
CO2: 26 mEq/L (ref 22–29)
CREATININE: 0.7 mg/dL (ref 0.6–1.1)
Calcium: 9 mg/dL (ref 8.4–10.4)
Chloride: 102 mEq/L (ref 98–109)
EGFR: >90 mL/min/{1.73_m2} (ref >90)
Glucose: 145 mg/dl — ABNORMAL HIGH (ref 70–140)
Potassium: 3.9 mEq/L (ref 3.5–5.1)
Sodium: 140 mEq/L (ref 136–145)
TOTAL PROTEIN: 6.9 g/dL (ref 6.4–8.3)

## 2014-11-07 LAB — CBC WITH DIFFERENTIAL/PLATELET
BASO%: 0.7 % (ref 0.0–2.0)
Basophils Absolute: 0 10*3/uL (ref 0.0–0.1)
EOS%: 3.1 % (ref 0.0–7.0)
Eosinophils Absolute: 0.1 10*3/uL (ref 0.0–0.5)
HEMATOCRIT: 36.9 % (ref 34.8–46.6)
HGB: 11.9 g/dL (ref 11.6–15.9)
LYMPH%: 48.7 % (ref 14.0–49.7)
MCH: 31.3 pg (ref 25.1–34.0)
MCHC: 32.2 g/dL (ref 31.5–36.0)
MCV: 97.1 fL (ref 79.5–101.0)
MONO#: 0.5 10*3/uL (ref 0.1–0.9)
MONO%: 11.1 % (ref 0.0–14.0)
NEUT#: 1.6 10*3/uL (ref 1.5–6.5)
NEUT%: 36.4 % — ABNORMAL LOW (ref 38.4–76.8)
Platelets: 183 10*3/uL (ref 145–400)
RBC: 3.8 10*6/uL (ref 3.70–5.45)
RDW: 15.2 % — AB (ref 11.2–14.5)
WBC: 4.3 10*3/uL (ref 3.9–10.3)
lymph#: 2.1 10*3/uL (ref 0.9–3.3)

## 2014-11-08 ENCOUNTER — Telehealth: Payer: Self-pay | Admitting: *Deleted

## 2014-11-08 NOTE — Telephone Encounter (Signed)
Per staff message and POF I have scheduled appts. Advised scheduler of appts. JMW  

## 2014-11-14 ENCOUNTER — Other Ambulatory Visit (HOSPITAL_BASED_OUTPATIENT_CLINIC_OR_DEPARTMENT_OTHER): Payer: Self-pay

## 2014-11-14 DIAGNOSIS — C3490 Malignant neoplasm of unspecified part of unspecified bronchus or lung: Secondary | ICD-10-CM

## 2014-11-14 LAB — CBC WITH DIFFERENTIAL/PLATELET
BASO%: 0.3 % (ref 0.0–2.0)
Basophils Absolute: 0 10*3/uL (ref 0.0–0.1)
EOS%: 0.9 % (ref 0.0–7.0)
Eosinophils Absolute: 0.1 10*3/uL (ref 0.0–0.5)
HCT: 36.4 % (ref 34.8–46.6)
HEMOGLOBIN: 12 g/dL (ref 11.6–15.9)
LYMPH%: 24.7 % (ref 14.0–49.7)
MCH: 32.4 pg (ref 25.1–34.0)
MCHC: 33 g/dL (ref 31.5–36.0)
MCV: 98.4 fL (ref 79.5–101.0)
MONO#: 0.8 10*3/uL (ref 0.1–0.9)
MONO%: 12.4 % (ref 0.0–14.0)
NEUT#: 4.1 10*3/uL (ref 1.5–6.5)
NEUT%: 61.7 % (ref 38.4–76.8)
PLATELETS: 196 10*3/uL (ref 145–400)
RBC: 3.7 10*6/uL (ref 3.70–5.45)
RDW: 15.8 % — AB (ref 11.2–14.5)
WBC: 6.6 10*3/uL (ref 3.9–10.3)
lymph#: 1.6 10*3/uL (ref 0.9–3.3)

## 2014-11-14 LAB — COMPREHENSIVE METABOLIC PANEL (CC13)
ALT: 29 U/L (ref 0–55)
AST: 23 U/L (ref 5–34)
Albumin: 3.6 g/dL (ref 3.5–5.0)
Alkaline Phosphatase: 121 U/L (ref 40–150)
Anion Gap: 14 mEq/L — ABNORMAL HIGH (ref 3–11)
BUN: 6.9 mg/dL — ABNORMAL LOW (ref 7.0–26.0)
CO2: 24 mEq/L (ref 22–29)
Calcium: 9.5 mg/dL (ref 8.4–10.4)
Chloride: 106 mEq/L (ref 98–109)
Creatinine: 0.7 mg/dL (ref 0.6–1.1)
GLUCOSE: 134 mg/dL (ref 70–140)
Potassium: 4.2 mEq/L (ref 3.5–5.1)
Sodium: 143 mEq/L (ref 136–145)
TOTAL PROTEIN: 7.1 g/dL (ref 6.4–8.3)

## 2014-11-20 ENCOUNTER — Telehealth: Payer: Self-pay | Admitting: Pulmonary Disease

## 2014-11-20 NOTE — Telephone Encounter (Signed)
OK to refill x1 Further refills from cancer center

## 2014-11-20 NOTE — Telephone Encounter (Signed)
Patient requesting refill of Xanax.  Last refill: 08/21/14.  Next OV 02/05/15 w/ TP.  Ok to refill?

## 2014-11-20 NOTE — Telephone Encounter (Signed)
Refill called into pharmacy. Pharmacy notified that patient needs to obtain any further refills through her oncologist.  Left message on patient's voicemail advising her that her refill has been called in.  Nothing further needed.

## 2014-11-21 ENCOUNTER — Ambulatory Visit (HOSPITAL_BASED_OUTPATIENT_CLINIC_OR_DEPARTMENT_OTHER): Payer: Self-pay

## 2014-11-21 ENCOUNTER — Other Ambulatory Visit: Payer: Self-pay

## 2014-11-21 ENCOUNTER — Other Ambulatory Visit (HOSPITAL_BASED_OUTPATIENT_CLINIC_OR_DEPARTMENT_OTHER): Payer: Self-pay

## 2014-11-21 ENCOUNTER — Ambulatory Visit (HOSPITAL_BASED_OUTPATIENT_CLINIC_OR_DEPARTMENT_OTHER): Payer: Self-pay | Admitting: Physician Assistant

## 2014-11-21 ENCOUNTER — Encounter: Payer: Self-pay | Admitting: Physician Assistant

## 2014-11-21 VITALS — BP 146/79 | HR 88 | Temp 98.0°F | Resp 18 | Ht 65.0 in | Wt 197.7 lb

## 2014-11-21 DIAGNOSIS — C3490 Malignant neoplasm of unspecified part of unspecified bronchus or lung: Secondary | ICD-10-CM

## 2014-11-21 DIAGNOSIS — Z5111 Encounter for antineoplastic chemotherapy: Secondary | ICD-10-CM

## 2014-11-21 DIAGNOSIS — C3412 Malignant neoplasm of upper lobe, left bronchus or lung: Secondary | ICD-10-CM

## 2014-11-21 DIAGNOSIS — C3411 Malignant neoplasm of upper lobe, right bronchus or lung: Secondary | ICD-10-CM

## 2014-11-21 DIAGNOSIS — F329 Major depressive disorder, single episode, unspecified: Secondary | ICD-10-CM

## 2014-11-21 DIAGNOSIS — K219 Gastro-esophageal reflux disease without esophagitis: Secondary | ICD-10-CM

## 2014-11-21 LAB — CBC WITH DIFFERENTIAL/PLATELET
BASO%: 0.1 % (ref 0.0–2.0)
BASOS ABS: 0 10*3/uL (ref 0.0–0.1)
EOS%: 0.1 % (ref 0.0–7.0)
Eosinophils Absolute: 0 10*3/uL (ref 0.0–0.5)
HEMATOCRIT: 36.4 % (ref 34.8–46.6)
HGB: 12.1 g/dL (ref 11.6–15.9)
LYMPH%: 21.7 % (ref 14.0–49.7)
MCH: 32.7 pg (ref 25.1–34.0)
MCHC: 33.2 g/dL (ref 31.5–36.0)
MCV: 98.4 fL (ref 79.5–101.0)
MONO#: 1.5 10*3/uL — ABNORMAL HIGH (ref 0.1–0.9)
MONO%: 14.7 % — ABNORMAL HIGH (ref 0.0–14.0)
NEUT%: 63.4 % (ref 38.4–76.8)
NEUTROS ABS: 6.4 10*3/uL (ref 1.5–6.5)
Platelets: 296 10*3/uL (ref 145–400)
RBC: 3.7 10*6/uL (ref 3.70–5.45)
RDW: 15.8 % — ABNORMAL HIGH (ref 11.2–14.5)
WBC: 10 10*3/uL (ref 3.9–10.3)
lymph#: 2.2 10*3/uL (ref 0.9–3.3)

## 2014-11-21 LAB — COMPREHENSIVE METABOLIC PANEL (CC13)
ALBUMIN: 3.8 g/dL (ref 3.5–5.0)
ALK PHOS: 118 U/L (ref 40–150)
ALT: 28 U/L (ref 0–55)
ANION GAP: 15 meq/L — AB (ref 3–11)
AST: 21 U/L (ref 5–34)
BUN: 11.3 mg/dL (ref 7.0–26.0)
CO2: 22 meq/L (ref 22–29)
Calcium: 10 mg/dL (ref 8.4–10.4)
Chloride: 103 mEq/L (ref 98–109)
Creatinine: 0.7 mg/dL (ref 0.6–1.1)
EGFR: >90 mL/min/{1.73_m2} (ref >90)
Glucose: 102 mg/dl (ref 70–140)
Potassium: 3.8 mEq/L (ref 3.5–5.1)
SODIUM: 141 meq/L (ref 136–145)
Total Protein: 7.4 g/dL (ref 6.4–8.3)

## 2014-11-21 LAB — TECHNOLOGIST REVIEW

## 2014-11-21 MED ORDER — PROCHLORPERAZINE MALEATE 10 MG PO TABS: 10.0000 mg | ORAL_TABLET | Freq: Four times a day (QID) | ORAL | Status: DC | PRN

## 2014-11-21 MED ORDER — HEPARIN SOD (PORK) LOCK FLUSH 100 UNIT/ML IV SOLN
500.0000 [IU] | Freq: Once | INTRAVENOUS | Status: AC | PRN
  Administered 2014-11-21: 500 [IU]
  Filled 2014-11-21: qty 5

## 2014-11-21 MED ORDER — SODIUM CHLORIDE 0.9 % IV SOLN
500.0000 mg/m2 | Freq: Once | INTRAVENOUS | Status: AC
  Administered 2014-11-21: 1000 mg via INTRAVENOUS
  Filled 2014-11-21: qty 40

## 2014-11-21 MED ORDER — SODIUM CHLORIDE 0.9 % IV SOLN
Freq: Once | INTRAVENOUS | Status: AC
  Administered 2014-11-21: 11:00:00 via INTRAVENOUS

## 2014-11-21 MED ORDER — SODIUM CHLORIDE 0.9 % IV SOLN
635.0000 mg | Freq: Once | INTRAVENOUS | Status: AC
  Administered 2014-11-21: 640 mg via INTRAVENOUS
  Filled 2014-11-21: qty 64

## 2014-11-21 MED ORDER — SODIUM CHLORIDE 0.9 % IJ SOLN
10.0000 mL | INTRAMUSCULAR | Status: DC | PRN
  Administered 2014-11-21: 10 mL
  Filled 2014-11-21: qty 10

## 2014-11-21 MED ORDER — SODIUM CHLORIDE 0.9 % IV SOLN
Freq: Once | INTRAVENOUS | Status: AC
  Administered 2014-11-21: 11:00:00 via INTRAVENOUS
  Filled 2014-11-21: qty 8

## 2014-11-21 NOTE — Patient Instructions (Signed)
Continue weekly labs as scheduled Follow-up in 3 weeks prior to next scheduled cycle of chemotherapy 

## 2014-11-21 NOTE — Progress Notes (Addendum)
[facility] [phone]   [phone]  OFFICE PROGRESS NOTE  No PCP Per Patient No address on file  DIAGNOSIS: stage IV (T2a, N0, M1a) non-small cell lung cancer, adenocarcinoma presented with bilateral pulmonary lesions. This could be also consider as synchronous primary tumors.  PRIOR THERAPY: None.  CURRENT THERAPY: Systemic chemotherapy with carboplatin for AUC of 5 and Alimta 500 MG/M2 every 3 weeks. Status post 4 cycles  INTERVAL HISTORY: Heather Banks 64 y.o. female returns to the clinic today for follow-up visit. Overall she's tolerating her chemotherapy relatively well without any significant adverse effects. She notes increased fatigue for a few days after chemotherapy. She denied having any significant weight loss or night sweats. She is eating well and "eating healthy".  She reports that she is watching her sodium intake.  She denied having any significant nausea or vomiting.  She has no fever or chills. The patient denied having any significant chest pain, shortness breath, cough or hemoptysis. She is here today to start cycle #5 of her chemotherapy.   MEDICAL HISTORY: Past Medical History  Diagnosis Date  . COPD (chronic obstructive pulmonary disease)     Albuterol neb as needed;Pulmicort neb daily  . GERD (gastroesophageal reflux disease)     takes Pantoprazole and Zantac daily  . Lung mass   . Pneumonia     hx of-last time about 4+yrs ago  . History of bronchitis     >5 yrs ago  . History of migraine     last one 2 wks ago  . Panic attacks     but doesn't take any meds    ALLERGIES:  has No Known Allergies.  MEDICATIONS:  Current Outpatient Prescriptions  Medication Sig Dispense Refill  . albuterol (PROVENTIL) (2.5 MG/3ML) 0.083% nebulizer solution Take 3 mLs (2.5 mg total) by nebulization every 6 (six) hours as needed for wheezing or shortness of breath. 75 mL 12  . ALPRAZolam (XANAX) 0.5 MG tablet TAKE 1 TABLET BY MOUTH  EVERY NIGHT AT BEDTIME AS NEEDED FOR ANXIETY 30 tablet 0  . aspirin-acetaminophen-caffeine (EXCEDRIN MIGRAINE) 811-572-62 MG per tablet Take 2 tablets by mouth every 6 (six) hours as needed for headache.    . dexamethasone (DECADRON) 4 MG tablet 4 mg by mouth twice a day the day before, day of and day after the chemotherapy every 3 weeks 40 tablet 1  . Fluticasone Furoate-Vilanterol 100-25 MCG/INH AEPB Inhale 1 puff into the lungs daily. 1 each 5  . folic acid (FOLVITE) 1 MG tablet Take 1 tablet (1 mg total) by mouth daily. 30 tablet 4  . lidocaine-prilocaine (EMLA) cream Apply 1 application topically as needed. (Patient taking differently: Apply 1 application topically as needed (For port-a-cath.). ) 30 g 1  . mirtazapine (REMERON) 30 MG tablet Take 1 tablet (30 mg total) by mouth at bedtime. 30 tablet 2  . Multiple Vitamins-Minerals (MULTIVITAMIN WITH MINERALS) tablet Take 1 tablet by mouth daily.    . nicotine (NICODERM CQ) 21 mg/24hr patch Place 1 patch (21 mg total) onto the skin daily. 28 patch 0  . omeprazole (PRILOSEC) 20 MG capsule Take 1 capsule (20 mg total) by mouth 2 (two) times daily before a meal. 60 capsule 1  . polyethylene glycol (MIRALAX / GLYCOLAX) packet Take 17 g by mouth daily. Takes 2 caps every night    . PRESCRIPTION MEDICATION She receives her treatments at the [facility]. Her oncologist is Dr. Julien Nordmann. She  received Alimta '925mg'$  and Carboplatin '560mg'$  on 08/30/14.    Marland Kitchen prochlorperazine (COMPAZINE) 10 MG tablet Take 1 tablet (10 mg total) by mouth every 6 (six) hours as needed for nausea or vomiting. 60 tablet 0  . HYDROcodone-acetaminophen (NORCO/VICODIN) 5-325 MG per tablet Take 1 tablet by mouth every 6 (six) hours as needed for moderate pain or severe pain. 30 tablet 0   No current facility-administered medications for this visit.    SURGICAL HISTORY:  Past Surgical History  Procedure Laterality Date  . Abdominal hysterectomy    . Oophorectomy     . Appendectomy    . Tonsillectomy    . Video bronchoscopy with endobronchial navigation N/A 08/14/2014    Procedure: VIDEO BRONCHOSCOPY WITH ENDOBRONCHIAL NAVIGATION;  Surgeon: Rigoberto Noel, MD;  Location: [facility];  Service: Thoracic;  Laterality: N/A;  . Portacath placement  jan. 2016    REVIEW OF SYSTEMS:  A comprehensive review of systems was negative except for: Constitutional: positive for fatigue Gastrointestinal: positive for reflux symptoms   PHYSICAL EXAMINATION: General appearance: alert, cooperative and no distress Head: Normocephalic, without obvious abnormality, atraumatic Neck: no adenopathy, no JVD, supple, symmetrical, trachea midline and thyroid not enlarged, symmetric, no tenderness/mass/nodules Lymph nodes: Cervical, supraclavicular, and axillary nodes normal. Resp: clear to auscultation bilaterally Back: symmetric, no curvature. ROM normal. No CVA tenderness. Cardio: regular rate and rhythm, S1, S2 normal, no murmur, click, rub or gallop GI: soft, non-tender; bowel sounds normal; no masses,  no organomegaly Extremities: extremities normal, atraumatic, no cyanosis or edema  ECOG PERFORMANCE STATUS: 1 - Symptomatic but completely ambulatory  Blood pressure 146/79, pulse 88, temperature 98 F (36.7 C), temperature source Oral, resp. rate 18, height '5\' 5"'$  (1.651 m), weight 197 lb 11.2 oz (89.676 kg), SpO2 98 %.  LABORATORY DATA: Lab Results  Component Value Date   WBC 10.0 11/21/2014   HGB 12.1 11/21/2014   HCT 36.4 11/21/2014   MCV 98.4 11/21/2014   PLT 296 11/21/2014      Chemistry      Component Value Date/Time   NA 141 11/21/2014 0832   NA 138 08/11/2014 0837   K 3.8 11/21/2014 0832   K 3.8 08/11/2014 0837   CL 103 08/11/2014 0837   CO2 22 11/21/2014 0832   CO2 23 08/11/2014 0837   BUN 11.3 11/21/2014 0832   BUN 8 08/11/2014 0837   CREATININE 0.7 11/21/2014 0832   CREATININE 0.57 08/11/2014 0837      Component Value Date/Time   CALCIUM 10.0  11/21/2014 0832   CALCIUM 9.0 08/11/2014 0837   ALKPHOS 118 11/21/2014 0832   ALKPHOS 145* 07/10/2014 0459   AST 21 11/21/2014 0832   AST 11 07/10/2014 0459   ALT 28 11/21/2014 0832   ALT 8 07/10/2014 0459   BILITOT <0.20 11/21/2014 0832   BILITOT <0.2* 07/10/2014 0459       RADIOGRAPHIC STUDIES: Ct Chest W Contrast  10/30/2014   CLINICAL DATA:  Followup lung cancer.  Chemotherapy on going.  EXAM: CT CHEST, ABDOMEN, AND PELVIS WITH CONTRAST  TECHNIQUE: Multidetector CT imaging of the chest, abdomen and pelvis was performed following the standard protocol during bolus administration of intravenous contrast.  CONTRAST:  126m OMNIPAQUE IOHEXOL 300 MG/ML  SOLN  COMPARISON:  PET-CT 07/31/2014  FINDINGS: CT CHEST FINDINGS  Mediastinum: Normal heart size. There is no pericardial effusion identified. Calcified atherosclerotic disease involves the thoracic aorta as well as the LAD, left circumflex and RCA Coronary arteries. No mediastinal or hilar  adenopathy identified.  Lungs/Pleura: There is no pleural effusion identified. Index mass within the left upper lobe measures 3.9 x 3.4 cm, image 11/ series 5. Previously 3.7 x 3.5 cm. Right upper lobe pulmonary nodule measures 1.8 x 1.5 cm, image 16/series 5. Previously 2.3 x 1.8 cm. Anterior right middle lobe cystic lesion is again identified measuring 2.8 x 2.5 cm, image 23/ series 5. Previously 2.9 x 2.5 cm.  Musculoskeletal: There are no aggressive lytic or sclerotic bone lesions identified.  CT ABDOMEN AND PELVIS FINDINGS  Hepatobiliary: No suspicious liver abnormality identified. There is mild hepatic steatosis. The gallbladder appears normal. No biliary dilatation.  Pancreas: Normal appearance of the pancreas.  Spleen: The spleen is negative.  Adrenals/Urinary Tract: Normal adrenal glands. Right renal cysts are again noted. Normal appearance of the left kidney. The urinary bladder appears normal.  Stomach/Bowel: The stomach and the small bowel loops have a  normal course and caliber without obstruction. Normal appearance of the colon.  Vascular/Lymphatic: There is calcified atherosclerotic disease involving the abdominal aorta. The aorta measures 2.6 cm in maximum AP dimension. No enlarged retroperitoneal or mesenteric adenopathy. No enlarged pelvic or inguinal lymph nodes.  Reproductive: Previous hysterectomy.  No adnexal mass noted.  Other: There is no ascites or focal fluid collections within the abdomen or pelvis.  Musculoskeletal: Review of the visualized osseous structures is negative for aggressive lytic or sclerotic bone lesion.  IMPRESSION: 1. Mixed response to therapy. Mass in the left upper lobe is not significantly improved in the interval. 2. The mass in the left upper lobe is not significantly changed in size compared with previous exam. The right upper lobe lesion however a has decreased in size in the interval. 3. Indeterminate cystic structure within the anterior right upper lobe is stable from previous exam and may represent a benign abnormality. 4. No new or progressive disease identified within the chest abdomen or pelvis. 5. Atherosclerotic disease including 3 vessel coronary artery calcification. 6. Ectatic abdominal aorta at risk for aneurysm development. Recommend followup by ultrasound in 5 years. This recommendation follows ACR consensus guidelines: White Paper of the ACR Incidental Findings Committee II on Vascular Findings. J Am Coll Radiol 2013; 10:789-794.   Electronically Signed   By: Kerby Moors M.D.   On: 10/30/2014 16:47   Ct Abdomen Pelvis W Contrast  10/30/2014   CLINICAL DATA:  Followup lung cancer.  Chemotherapy on going.  EXAM: CT CHEST, ABDOMEN, AND PELVIS WITH CONTRAST  TECHNIQUE: Multidetector CT imaging of the chest, abdomen and pelvis was performed following the standard protocol during bolus administration of intravenous contrast.  CONTRAST:  184m OMNIPAQUE IOHEXOL 300 MG/ML  SOLN  COMPARISON:  PET-CT 07/31/2014   FINDINGS: CT CHEST FINDINGS  Mediastinum: Normal heart size. There is no pericardial effusion identified. Calcified atherosclerotic disease involves the thoracic aorta as well as the LAD, left circumflex and RCA Coronary arteries. No mediastinal or hilar adenopathy identified.  Lungs/Pleura: There is no pleural effusion identified. Index mass within the left upper lobe measures 3.9 x 3.4 cm, image 11/ series 5. Previously 3.7 x 3.5 cm. Right upper lobe pulmonary nodule measures 1.8 x 1.5 cm, image 16/series 5. Previously 2.3 x 1.8 cm. Anterior right middle lobe cystic lesion is again identified measuring 2.8 x 2.5 cm, image 23/ series 5. Previously 2.9 x 2.5 cm.  Musculoskeletal: There are no aggressive lytic or sclerotic bone lesions identified.  CT ABDOMEN AND PELVIS FINDINGS  Hepatobiliary: No suspicious liver abnormality identified. There is mild  hepatic steatosis. The gallbladder appears normal. No biliary dilatation.  Pancreas: Normal appearance of the pancreas.  Spleen: The spleen is negative.  Adrenals/Urinary Tract: Normal adrenal glands. Right renal cysts are again noted. Normal appearance of the left kidney. The urinary bladder appears normal.  Stomach/Bowel: The stomach and the small bowel loops have a normal course and caliber without obstruction. Normal appearance of the colon.  Vascular/Lymphatic: There is calcified atherosclerotic disease involving the abdominal aorta. The aorta measures 2.6 cm in maximum AP dimension. No enlarged retroperitoneal or mesenteric adenopathy. No enlarged pelvic or inguinal lymph nodes.  Reproductive: Previous hysterectomy.  No adnexal mass noted.  Other: There is no ascites or focal fluid collections within the abdomen or pelvis.  Musculoskeletal: Review of the visualized osseous structures is negative for aggressive lytic or sclerotic bone lesion.  IMPRESSION: 1. Mixed response to therapy. Mass in the left upper lobe is not significantly improved in the interval. 2. The  mass in the left upper lobe is not significantly changed in size compared with previous exam. The right upper lobe lesion however a has decreased in size in the interval. 3. Indeterminate cystic structure within the anterior right upper lobe is stable from previous exam and may represent a benign abnormality. 4. No new or progressive disease identified within the chest abdomen or pelvis. 5. Atherosclerotic disease including 3 vessel coronary artery calcification. 6. Ectatic abdominal aorta at risk for aneurysm development. Recommend followup by ultrasound in 5 years. This recommendation follows ACR consensus guidelines: White Paper of the ACR Incidental Findings Committee II on Vascular Findings. J Am Coll Radiol 2013; 10:789-794.   Electronically Signed   By: Kerby Moors M.D.   On: 10/30/2014 16:47    ASSESSMENT AND PLAN: This is a very pleasant 64 years old white female with stage IV non-small cell lung cancer currently undergoing systemic chemotherapy with carboplatin and Alimta status post 3 cycles. The patient tolerated the last cycle of her treatment fairly well. Her recent restaging CT scan revealed interval decrease in the size of her right upper lobe lesion with no new or progressive disease identified in the chest abdomen or pelvis. These results were reviewed with patient. The patient was discussed with and also seen by Dr. Julien Nordmann. She will proceed with cycle #5 of her systemic chemotherapy with carboplatin and Alimta today as scheduled. She will continue with weekly labs as previously scheduled. She will return in 3 weeks prior to the start of cycle #6. For depression, she will continue on Remeron 30 mg by mouth daily at bedtime. For the acid reflux, she will continue on Prilosec 20 mg by mouth twice a day.  She was advised to call immediately if she has any concerning symptoms in the interval. The patient voices understanding of current disease status and treatment options and is in  agreement with the current care plan.  All questions were answered. The patient knows to call the clinic with any problems, questions or concerns. We can certainly see the patient much sooner if necessary.  Carlton Adam, PA-C 11/21/2014  ADDENDUM:  Hematology/Oncology Attending:  I had a face to face encounter with the patient. I recommended her care plan. This is a very pleasant 64 years old white female with metastatic non-small cell lung cancer currently undergoing systemic chemotherapy with carboplatin and Alimta status post 4 cycles. The patient is tolerating the treatment fairly well with no specific complaints except for mild fatigue. I recommended for her to proceed with cycle #5  today as scheduled. She would come back for follow-up visit in 3 weeks for evaluation before starting cycle #6. The patient will continue on Remeron for depression and Prilosec for GERD as scheduled. She was advised to call immediately if she has any concerning symptoms in the interval.  Disclaimer: This note was dictated with voice recognition software. Similar sounding words can inadvertently be transcribed and may be missed upon review. [name]., MD 11/25/2014

## 2014-11-21 NOTE — Patient Instructions (Signed)
[facility] Discharge Instructions for Patients Receiving Chemotherapy  Today you received the following chemotherapy agents Alimta and Carboplatin.  To help prevent nausea and vomiting after your treatment, we encourage you to take your nausea medication as prescribed.   If you develop nausea and vomiting that is not controlled by your nausea medication, call the clinic.   BELOW ARE SYMPTOMS THAT SHOULD BE REPORTED IMMEDIATELY:  *FEVER GREATER THAN 100.5 F  *CHILLS WITH OR WITHOUT FEVER  NAUSEA AND VOMITING THAT IS NOT CONTROLLED WITH YOUR NAUSEA MEDICATION  *UNUSUAL SHORTNESS OF BREATH  *UNUSUAL BRUISING OR BLEEDING  TENDERNESS IN MOUTH AND THROAT WITH OR WITHOUT PRESENCE OF ULCERS  *URINARY PROBLEMS  *BOWEL PROBLEMS  UNUSUAL RASH Items with * indicate a potential emergency and should be followed up as soon as possible.  Feel free to call the clinic you have any questions or concerns. The clinic phone number is [phone].  Please show the CHEMO ALERT CARD at check-in to the Emergency Department and triage nurse.   

## 2014-11-27 ENCOUNTER — Other Ambulatory Visit: Payer: Self-pay | Admitting: Medical Oncology

## 2014-11-27 DIAGNOSIS — C3490 Malignant neoplasm of unspecified part of unspecified bronchus or lung: Secondary | ICD-10-CM

## 2014-11-28 ENCOUNTER — Other Ambulatory Visit: Payer: Self-pay | Admitting: Internal Medicine

## 2014-11-28 ENCOUNTER — Other Ambulatory Visit (HOSPITAL_BASED_OUTPATIENT_CLINIC_OR_DEPARTMENT_OTHER): Payer: Self-pay

## 2014-11-28 DIAGNOSIS — C3411 Malignant neoplasm of upper lobe, right bronchus or lung: Secondary | ICD-10-CM

## 2014-11-28 DIAGNOSIS — C3490 Malignant neoplasm of unspecified part of unspecified bronchus or lung: Secondary | ICD-10-CM

## 2014-11-28 LAB — COMPREHENSIVE METABOLIC PANEL (CC13)
ALT: 46 U/L (ref 0–55)
ANION GAP: 10 meq/L (ref 3–11)
AST: 34 U/L (ref 5–34)
Albumin: 3.7 g/dL (ref 3.5–5.0)
Alkaline Phosphatase: 121 U/L (ref 40–150)
BUN: 9.2 mg/dL (ref 7.0–26.0)
CALCIUM: 9.7 mg/dL (ref 8.4–10.4)
CHLORIDE: 104 meq/L (ref 98–109)
CO2: 27 meq/L (ref 22–29)
Creatinine: 0.6 mg/dL (ref 0.6–1.1)
EGFR: >90 mL/min/{1.73_m2} (ref >90)
Glucose: 105 mg/dl (ref 70–140)
POTASSIUM: 4.4 meq/L (ref 3.5–5.1)
SODIUM: 140 meq/L (ref 136–145)
Total Bilirubin: 0.3 mg/dL (ref 0.20–1.20)
Total Protein: 7.1 g/dL (ref 6.4–8.3)

## 2014-11-28 LAB — CBC WITH DIFFERENTIAL/PLATELET
BASO%: 0.5 % (ref 0.0–2.0)
Basophils Absolute: 0 10*3/uL (ref 0.0–0.1)
EOS%: 2 % (ref 0.0–7.0)
Eosinophils Absolute: 0.1 10*3/uL (ref 0.0–0.5)
HCT: 36.4 % (ref 34.8–46.6)
HGB: 11.9 g/dL (ref 11.6–15.9)
LYMPH%: 45 % (ref 14.0–49.7)
MCH: 31.5 pg (ref 25.1–34.0)
MCHC: 32.7 g/dL (ref 31.5–36.0)
MCV: 96.3 fL (ref 79.5–101.0)
MONO#: 0.6 10*3/uL (ref 0.1–0.9)
MONO%: 16 % — ABNORMAL HIGH (ref 0.0–14.0)
NEUT#: 1.4 10*3/uL — ABNORMAL LOW (ref 1.5–6.5)
NEUT%: 36.5 % — ABNORMAL LOW (ref 38.4–76.8)
Platelets: 222 10*3/uL (ref 145–400)
RBC: 3.78 10*6/uL (ref 3.70–5.45)
RDW: 15.4 % — ABNORMAL HIGH (ref 11.2–14.5)
WBC: 4 10*3/uL (ref 3.9–10.3)
lymph#: 1.8 10*3/uL (ref 0.9–3.3)

## 2014-12-05 ENCOUNTER — Other Ambulatory Visit (HOSPITAL_BASED_OUTPATIENT_CLINIC_OR_DEPARTMENT_OTHER): Payer: Self-pay

## 2014-12-05 ENCOUNTER — Ambulatory Visit: Payer: Self-pay | Admitting: Nutrition

## 2014-12-05 ENCOUNTER — Other Ambulatory Visit: Payer: Self-pay

## 2014-12-05 DIAGNOSIS — C3411 Malignant neoplasm of upper lobe, right bronchus or lung: Secondary | ICD-10-CM

## 2014-12-05 DIAGNOSIS — C3490 Malignant neoplasm of unspecified part of unspecified bronchus or lung: Secondary | ICD-10-CM

## 2014-12-05 LAB — CBC WITH DIFFERENTIAL/PLATELET
BASO%: 1 % (ref 0.0–2.0)
BASOS ABS: 0.1 10*3/uL (ref 0.0–0.1)
EOS ABS: 0 10*3/uL (ref 0.0–0.5)
EOS%: 0.8 % (ref 0.0–7.0)
HEMATOCRIT: 36 % (ref 34.8–46.6)
HGB: 12.1 g/dL (ref 11.6–15.9)
LYMPH%: 30.6 % (ref 14.0–49.7)
MCH: 32.5 pg (ref 25.1–34.0)
MCHC: 33.6 g/dL (ref 31.5–36.0)
MCV: 96.7 fL (ref 79.5–101.0)
MONO#: 0.8 10*3/uL (ref 0.1–0.9)
MONO%: 13.1 % (ref 0.0–14.0)
NEUT%: 54.5 % (ref 38.4–76.8)
NEUTROS ABS: 3.3 10*3/uL (ref 1.5–6.5)
PLATELETS: 222 10*3/uL (ref 145–400)
RBC: 3.72 10*6/uL (ref 3.70–5.45)
RDW: 15.7 % — ABNORMAL HIGH (ref 11.2–14.5)
WBC: 6.1 10*3/uL (ref 3.9–10.3)
lymph#: 1.9 10*3/uL (ref 0.9–3.3)

## 2014-12-05 LAB — COMPREHENSIVE METABOLIC PANEL (CC13)
ALK PHOS: 120 U/L (ref 40–150)
ALT: 34 U/L (ref 0–55)
AST: 28 U/L (ref 5–34)
Albumin: 3.6 g/dL (ref 3.5–5.0)
Anion Gap: 14 mEq/L — ABNORMAL HIGH (ref 3–11)
BUN: 8.5 mg/dL (ref 7.0–26.0)
CHLORIDE: 106 meq/L (ref 98–109)
CO2: 22 mEq/L (ref 22–29)
CREATININE: 0.7 mg/dL (ref 0.6–1.1)
Calcium: 9.5 mg/dL (ref 8.4–10.4)
EGFR: >90 mL/min/{1.73_m2} (ref >90)
Glucose: 103 mg/dl (ref 70–140)
POTASSIUM: 4.4 meq/L (ref 3.5–5.1)
Sodium: 142 mEq/L (ref 136–145)
Total Bilirubin: <0.2 mg/dL (ref 0.20–1.20)
Total Protein: 7 g/dL (ref 6.4–8.3)

## 2014-12-05 NOTE — Progress Notes (Signed)
64 year old female diagnosed with lung cancer.  She is a patient of Dr. Earlie Server.  Past medical history includes COPD, GERD, pneumonia, bronchitis, migraines and panic attacks.  Medications include Xanax, Decadron, Folvite, Remeron, multivitamin, Prilosec, MiraLAX, and Compazine.  Labs were reviewed.  Height: 65 inches. Weight: 197.7 pounds April 26. Usual body weight: 160 pounds in February 2016. BMI: 32.9.  Patient reports she is to receive 1 more chemotherapy treatment.  She will then begin radiation therapy. She has occasional nausea but no vomiting. States she has been on steroids. Patient very upset that she has gained greater than 35 pounds over 2 months. Patient reports she has not changed how or what she is eating. She denies changes in activity level. Attempted to get a 24-hour recall from patient, however information was vague despite prompting.   Patient complains of tightness/fluid retention especially in her "stomach: late at night. States she has told her MD.  Nutrition diagnosis:  Unintended weight gain related to diagnosis of lung cancer as evidenced by 35 pound weight gain.  Intervention:  Recommended patient document meal and snack intake in a food journal to bring to nutrition follow-up appointment. Educated patient on the importance of increasing overall protein in her diet. Recommended portion control. Educated patient on exercise recommendations with M.D. permission. Provided fact sheets.  Questions were answered.  Teach back method used.  Contact information was given.  Monitoring, evaluation, goals: Patient will tolerate adequate calories and protein to maintain lean muscle mass.  Next visit: Tuesday, May 17, during infusion.  **Disclaimer: This note was dictated with voice recognition software. Similar sounding words can inadvertently be transcribed and this note may contain transcription errors which may not have been corrected upon publication of  note.**

## 2014-12-11 ENCOUNTER — Other Ambulatory Visit: Payer: Self-pay | Admitting: Internal Medicine

## 2014-12-12 ENCOUNTER — Encounter: Payer: Self-pay | Admitting: Physician Assistant

## 2014-12-12 ENCOUNTER — Ambulatory Visit (HOSPITAL_BASED_OUTPATIENT_CLINIC_OR_DEPARTMENT_OTHER): Payer: Self-pay | Admitting: Physician Assistant

## 2014-12-12 ENCOUNTER — Ambulatory Visit: Payer: Self-pay | Admitting: Nutrition

## 2014-12-12 ENCOUNTER — Encounter: Payer: Self-pay | Admitting: Nutrition

## 2014-12-12 ENCOUNTER — Ambulatory Visit (HOSPITAL_BASED_OUTPATIENT_CLINIC_OR_DEPARTMENT_OTHER): Payer: Self-pay

## 2014-12-12 ENCOUNTER — Telehealth: Payer: Self-pay | Admitting: Internal Medicine

## 2014-12-12 ENCOUNTER — Other Ambulatory Visit (HOSPITAL_BASED_OUTPATIENT_CLINIC_OR_DEPARTMENT_OTHER): Payer: Self-pay

## 2014-12-12 ENCOUNTER — Ambulatory Visit: Payer: Self-pay

## 2014-12-12 VITALS — BP 149/90 | HR 100 | Temp 98.5°F | Resp 18 | Ht 65.0 in | Wt 203.8 lb

## 2014-12-12 DIAGNOSIS — C3411 Malignant neoplasm of upper lobe, right bronchus or lung: Secondary | ICD-10-CM

## 2014-12-12 DIAGNOSIS — C3490 Malignant neoplasm of unspecified part of unspecified bronchus or lung: Secondary | ICD-10-CM

## 2014-12-12 DIAGNOSIS — C3492 Malignant neoplasm of unspecified part of left bronchus or lung: Secondary | ICD-10-CM

## 2014-12-12 DIAGNOSIS — Z5111 Encounter for antineoplastic chemotherapy: Secondary | ICD-10-CM

## 2014-12-12 DIAGNOSIS — C3412 Malignant neoplasm of upper lobe, left bronchus or lung: Secondary | ICD-10-CM

## 2014-12-12 LAB — CBC WITH DIFFERENTIAL/PLATELET
BASO%: 1 % (ref 0.0–2.0)
BASOS ABS: 0.1 10*3/uL (ref 0.0–0.1)
EOS%: 0.2 % (ref 0.0–7.0)
Eosinophils Absolute: 0 10*3/uL (ref 0.0–0.5)
HCT: 37.4 % (ref 34.8–46.6)
HEMOGLOBIN: 12.7 g/dL (ref 11.6–15.9)
LYMPH%: 22.5 % (ref 14.0–49.7)
MCH: 33.4 pg (ref 25.1–34.0)
MCHC: 34.1 g/dL (ref 31.5–36.0)
MCV: 97.9 fL (ref 79.5–101.0)
MONO#: 1.7 10*3/uL — ABNORMAL HIGH (ref 0.1–0.9)
MONO%: 16.8 % — AB (ref 0.0–14.0)
NEUT#: 6.2 10*3/uL (ref 1.5–6.5)
NEUT%: 59.5 % (ref 38.4–76.8)
Platelets: 388 10*3/uL (ref 145–400)
RBC: 3.82 10*6/uL (ref 3.70–5.45)
RDW: 16.1 % — AB (ref 11.2–14.5)
WBC: 10.4 10*3/uL — ABNORMAL HIGH (ref 3.9–10.3)
lymph#: 2.3 10*3/uL (ref 0.9–3.3)

## 2014-12-12 LAB — COMPREHENSIVE METABOLIC PANEL (CC13)
ALBUMIN: 3.8 g/dL (ref 3.5–5.0)
ALT: 33 U/L (ref 0–55)
ANION GAP: 13 meq/L — AB (ref 3–11)
AST: 26 U/L (ref 5–34)
Alkaline Phosphatase: 124 U/L (ref 40–150)
BUN: 10.4 mg/dL (ref 7.0–26.0)
CO2: 27 mEq/L (ref 22–29)
CREATININE: 0.7 mg/dL (ref 0.6–1.1)
Calcium: 10.3 mg/dL (ref 8.4–10.4)
Chloride: 102 mEq/L (ref 98–109)
Glucose: 109 mg/dl (ref 70–140)
Potassium: 4.6 mEq/L (ref 3.5–5.1)
Sodium: 141 mEq/L (ref 136–145)
Total Bilirubin: <0.2 mg/dL (ref 0.20–1.20)
Total Protein: 7.4 g/dL (ref 6.4–8.3)

## 2014-12-12 MED ORDER — CYANOCOBALAMIN 1000 MCG/ML IJ SOLN
1000.0000 ug | Freq: Once | INTRAMUSCULAR | Status: AC
  Administered 2014-12-12: 1000 ug via INTRAMUSCULAR

## 2014-12-12 MED ORDER — SODIUM CHLORIDE 0.9 % IJ SOLN
10.0000 mL | INTRAMUSCULAR | Status: DC | PRN
  Administered 2014-12-12: 10 mL
  Filled 2014-12-12: qty 10

## 2014-12-12 MED ORDER — SODIUM CHLORIDE 0.9 % IV SOLN
Freq: Once | INTRAVENOUS | Status: AC
  Administered 2014-12-12: 11:00:00 via INTRAVENOUS
  Filled 2014-12-12: qty 8

## 2014-12-12 MED ORDER — CYANOCOBALAMIN 1000 MCG/ML IJ SOLN
INTRAMUSCULAR | Status: AC
  Filled 2014-12-12: qty 1

## 2014-12-12 MED ORDER — SODIUM CHLORIDE 0.9 % IV SOLN
635.0000 mg | Freq: Once | INTRAVENOUS | Status: AC
  Administered 2014-12-12: 640 mg via INTRAVENOUS
  Filled 2014-12-12: qty 64

## 2014-12-12 MED ORDER — SODIUM CHLORIDE 0.9 % IV SOLN
Freq: Once | INTRAVENOUS | Status: AC
  Administered 2014-12-12: 11:00:00 via INTRAVENOUS

## 2014-12-12 MED ORDER — HEPARIN SOD (PORK) LOCK FLUSH 100 UNIT/ML IV SOLN
500.0000 [IU] | Freq: Once | INTRAVENOUS | Status: AC | PRN
  Administered 2014-12-12: 500 [IU]
  Filled 2014-12-12: qty 5

## 2014-12-12 MED ORDER — SODIUM CHLORIDE 0.9 % IV SOLN
500.0000 mg/m2 | Freq: Once | INTRAVENOUS | Status: AC
  Administered 2014-12-12: 1000 mg via INTRAVENOUS
  Filled 2014-12-12: qty 40

## 2014-12-12 NOTE — Progress Notes (Signed)
Nutrition follow-up completed with patient. Patient weight has increased and was documented as 203.8 pounds May 17 increased from 197.7 pounds April 26. Patient reports she does not why she is gaining weight. 7 day dietary recall reveals patient is eating fast food at least once a day along with regular Sweet tea and lemonade 2-3 times daily. Expect patient consuming excess calories. Patient reports she likes to continue to "treat herself."  Nutrition diagnosis: Unintended weight gain continues.  Intervention: Provided suggestions for patient to reduce overall calories. Recommended patient replace sweetened drinks with water. Discouraged patient from consuming calorie rich foods such as Lucendia Herrlich and desserts. It does not seem patient is ready to make healthy lifestyle changes to her diet at this time.  Monitoring, evaluation, goals: Patient should attempt to reduce overall calories to avoid weight gain.  Next visit: Patient will contact me for questions or concerns.  **Disclaimer: This note was dictated with voice recognition software. Similar sounding words can inadvertently be transcribed and this note may contain transcription errors which may not have been corrected upon publication of note.**

## 2014-12-12 NOTE — Patient Instructions (Signed)
Continue weekly labs as scheduled Follow-up in 3 weeks with a restaging CT scan of your chest, abdomen and pelvis to reevaluate your disease and to discuss further treatment options

## 2014-12-12 NOTE — Progress Notes (Signed)
[place] [phone]   Fax:(336) [phone]  OFFICE PROGRESS NOTE  No PCP Per Patient No address on file  DIAGNOSIS: stage IV (T2a, N0, M1a) non-small cell lung cancer, adenocarcinoma presented with bilateral pulmonary lesions. This could be also consider as synchronous primary tumors.  PRIOR THERAPY: None.  CURRENT THERAPY: Systemic chemotherapy with carboplatin for AUC of 5 and Alimta 500 MG/M2 every 3 weeks. Status post 5 cycles  INTERVAL HISTORY: Heather Banks 64 y.o. female returns to the clinic today for follow-up visit. Overall she's tolerating her chemotherapy relatively well without any significant adverse effects. She notes increased fatigue for a few days after chemotherapy. She denied having any significant weight loss or night sweats. She is eating well and "eating healthy".  She reports that she is watching her sodium intake.  She denied having any significant nausea or vomiting.  She has no fever or chills. The patient denied having any significant chest pain, shortness breath, cough or hemoptysis. She is here today to start cycle #6 of her chemotherapy.   MEDICAL HISTORY: Past Medical History  Diagnosis Date  . COPD (chronic obstructive pulmonary disease)     Albuterol neb as needed;Pulmicort neb daily  . GERD (gastroesophageal reflux disease)     takes Pantoprazole and Zantac daily  . Lung mass   . Pneumonia     hx of-last time about 4+yrs ago  . History of bronchitis     >5 yrs ago  . History of migraine     last one 2 wks ago  . Panic attacks     but doesn't take any meds    ALLERGIES:  has No Known Allergies.  MEDICATIONS:  Current Outpatient Prescriptions  Medication Sig Dispense Refill  . albuterol (PROVENTIL) (2.5 MG/3ML) 0.083% nebulizer solution Take 3 mLs (2.5 mg total) by nebulization every 6 (six) hours as needed for wheezing or shortness of breath. 75 mL 12  . ALPRAZolam (XANAX) 0.5 MG tablet TAKE 1 TABLET BY MOUTH  EVERY NIGHT AT BEDTIME AS NEEDED FOR ANXIETY 30 tablet 0  . aspirin-acetaminophen-caffeine (EXCEDRIN MIGRAINE) 588-502-77 MG per tablet Take 2 tablets by mouth every 6 (six) hours as needed for headache.    . dexamethasone (DECADRON) 4 MG tablet 4 mg by mouth twice a day the day before, day of and day after the chemotherapy every 3 weeks 40 tablet 1  . Fluticasone Furoate-Vilanterol 100-25 MCG/INH AEPB Inhale 1 puff into the lungs daily. 1 each 5  . folic acid (FOLVITE) 1 MG tablet Take 1 tablet (1 mg total) by mouth daily. 30 tablet 4  . HYDROcodone-acetaminophen (NORCO/VICODIN) 5-325 MG per tablet Take 1 tablet by mouth every 6 (six) hours as needed for moderate pain or severe pain. 30 tablet 0  . lidocaine-prilocaine (EMLA) cream Apply 1 application topically as needed. (Patient taking differently: Apply 1 application topically as needed (For port-a-cath.). ) 30 g 1  . mirtazapine (REMERON) 30 MG tablet TAKE 1 TABLET BY MOUTH EVERY NIGHT AT BEDTIME 30 tablet 0  . Multiple Vitamins-Minerals (MULTIVITAMIN WITH MINERALS) tablet Take 1 tablet by mouth daily.    . nicotine (NICODERM CQ) 21 mg/24hr patch Place 1 patch (21 mg total) onto the skin daily. 28 patch 0  . omeprazole (PRILOSEC) 20 MG capsule Take 1 capsule (20 mg total) by mouth 2 (two) times daily before a meal. 60 capsule 1  . polyethylene glycol (MIRALAX / GLYCOLAX) packet Take 17 g by mouth daily. Takes  2 caps every night    . PRESCRIPTION MEDICATION She receives her treatments at the [facility]. Her oncologist is Dr. Julien Nordmann. She received Alimta '925mg'$  and Carboplatin '560mg'$  on 08/30/14.    Marland Kitchen prochlorperazine (COMPAZINE) 10 MG tablet Take 1 tablet (10 mg total) by mouth every 6 (six) hours as needed for nausea or vomiting. 60 tablet 0   No current facility-administered medications for this visit.   Facility-Administered Medications Ordered in Other Visits  Medication Dose Route Frequency Provider Last Rate Last Dose    . sodium chloride 0.9 % injection 10 mL  10 mL Intracatheter PRN Curt Bears, MD   10 mL at 12/12/14 1217    SURGICAL HISTORY:  Past Surgical History  Procedure Laterality Date  . Abdominal hysterectomy    . Oophorectomy    . Appendectomy    . Tonsillectomy    . Video bronchoscopy with endobronchial navigation N/A 08/14/2014    Procedure: VIDEO BRONCHOSCOPY WITH ENDOBRONCHIAL NAVIGATION;  Surgeon: Rigoberto Noel, MD;  Location: [place];  Service: Thoracic;  Laterality: N/A;  . Portacath placement  jan. 2016    REVIEW OF SYSTEMS:  A comprehensive review of systems was negative except for: Constitutional: positive for fatigue Gastrointestinal: positive for reflux symptoms   PHYSICAL EXAMINATION: General appearance: alert, cooperative and no distress Head: Normocephalic, without obvious abnormality, atraumatic Neck: no adenopathy, no JVD, supple, symmetrical, trachea midline and thyroid not enlarged, symmetric, no tenderness/mass/nodules Lymph nodes: Cervical, supraclavicular, and axillary nodes normal. Resp: clear to auscultation bilaterally Back: symmetric, no curvature. ROM normal. No CVA tenderness. Cardio: regular rate and rhythm, S1, S2 normal, no murmur, click, rub or gallop GI: soft, non-tender; bowel sounds normal; no masses,  no organomegaly Extremities: extremities normal, atraumatic, no cyanosis or edema  ECOG PERFORMANCE STATUS: 1 - Symptomatic but completely ambulatory  Blood pressure 149/90, pulse 100, temperature 98.5 F (36.9 C), temperature source Oral, resp. rate 18, height '5\' 5"'$  (1.651 m), weight 203 lb 12.8 oz (92.443 kg).  LABORATORY DATA: Lab Results  Component Value Date   WBC 10.4* 12/12/2014   HGB 12.7 12/12/2014   HCT 37.4 12/12/2014   MCV 97.9 12/12/2014   PLT 388 12/12/2014      Chemistry      Component Value Date/Time   NA 141 12/12/2014 0922   NA 138 08/11/2014 0837   K 4.6 12/12/2014 0922   K 3.8 08/11/2014 0837   CL 103 08/11/2014 0837    CO2 27 12/12/2014 0922   CO2 23 08/11/2014 0837   BUN 10.4 12/12/2014 0922   BUN 8 08/11/2014 0837   CREATININE 0.7 12/12/2014 0922   CREATININE 0.57 08/11/2014 0837      Component Value Date/Time   CALCIUM 10.3 12/12/2014 0922   CALCIUM 9.0 08/11/2014 0837   ALKPHOS 124 12/12/2014 0922   ALKPHOS 145* 07/10/2014 0459   AST 26 12/12/2014 0922   AST 11 07/10/2014 0459   ALT 33 12/12/2014 0922   ALT 8 07/10/2014 0459   BILITOT <0.20 12/12/2014 0922   BILITOT <0.2* 07/10/2014 0459       RADIOGRAPHIC STUDIES: No results found.  ASSESSMENT AND PLAN: This is a very pleasant 64 years old white female with stage IV non-small cell lung cancer currently undergoing systemic chemotherapy with carboplatin and Alimta status post 3 cycles. The patient tolerated the last cycle of her treatment fairly well. Her last restaging CT scan revealed interval decrease in the size of her right upper lobe lesion  with no new or progressive disease identified in the chest abdomen or pelvis.  She will proceed with cycle #6 of her systemic chemotherapy with carboplatin and Alimta today as scheduled. She will continue with weekly labs as previously scheduled. She will return in 3 weeks with a restaging CT scan of the chest, abdomen and pelvis with contrast to reevaluate her disease. Further treatment options will be discussed and will be based on the results of the restaging CT scan. For depression, she will continue on Remeron 30 mg by mouth daily at bedtime. For the acid reflux, she will continue on Prilosec 20 mg by mouth twice a day.  She was advised to call immediately if she has any concerning symptoms in the interval. The patient voices understanding of current disease status and treatment options and is in agreement with the current care plan.  All questions were answered. The patient knows to call the clinic with any problems, questions or concerns. We can certainly see the patient much sooner if  necessary.  Carlton Adam, PA-C 12/12/2014

## 2014-12-12 NOTE — Telephone Encounter (Signed)
Gave adn printed appt sched and avs fo rpt for May and JUNE

## 2014-12-12 NOTE — Patient Instructions (Signed)
[facility] Discharge Instructions for Patients Receiving Chemotherapy  Today you received the following chemotherapy agents Alimta, Carboplatin   To help prevent nausea and vomiting after your treatment, we encourage you to take your nausea medication as directed.   If you develop nausea and vomiting that is not controlled by your nausea medication, call the clinic.   BELOW ARE SYMPTOMS THAT SHOULD BE REPORTED IMMEDIATELY:  *FEVER GREATER THAN 100.5 F  *CHILLS WITH OR WITHOUT FEVER  NAUSEA AND VOMITING THAT IS NOT CONTROLLED WITH YOUR NAUSEA MEDICATION  *UNUSUAL SHORTNESS OF BREATH  *UNUSUAL BRUISING OR BLEEDING  TENDERNESS IN MOUTH AND THROAT WITH OR WITHOUT PRESENCE OF ULCERS  *URINARY PROBLEMS  *BOWEL PROBLEMS  UNUSUAL RASH Items with * indicate a potential emergency and should be followed up as soon as possible.  Feel free to call the clinic you have any questions or concerns. The clinic phone number is [phone].  Please show the [facility] at check-in to the Emergency Department and triage nurse.

## 2014-12-19 ENCOUNTER — Other Ambulatory Visit (HOSPITAL_BASED_OUTPATIENT_CLINIC_OR_DEPARTMENT_OTHER): Payer: Self-pay

## 2014-12-19 DIAGNOSIS — C3490 Malignant neoplasm of unspecified part of unspecified bronchus or lung: Secondary | ICD-10-CM

## 2014-12-19 DIAGNOSIS — C3411 Malignant neoplasm of upper lobe, right bronchus or lung: Secondary | ICD-10-CM

## 2014-12-19 LAB — COMPREHENSIVE METABOLIC PANEL (CC13)
ALBUMIN: 3.5 g/dL (ref 3.5–5.0)
ALT: 49 U/L (ref 0–55)
AST: 34 U/L (ref 5–34)
Alkaline Phosphatase: 109 U/L (ref 40–150)
Anion Gap: 12 mEq/L — ABNORMAL HIGH (ref 3–11)
BUN: 10.1 mg/dL (ref 7.0–26.0)
CHLORIDE: 103 meq/L (ref 98–109)
CO2: 26 mEq/L (ref 22–29)
Calcium: 9.1 mg/dL (ref 8.4–10.4)
Creatinine: 0.7 mg/dL (ref 0.6–1.1)
Glucose: 133 mg/dl (ref 70–140)
POTASSIUM: 4.4 meq/L (ref 3.5–5.1)
SODIUM: 141 meq/L (ref 136–145)
Total Bilirubin: 0.25 mg/dL (ref 0.20–1.20)
Total Protein: 6.7 g/dL (ref 6.4–8.3)

## 2014-12-19 LAB — CBC WITH DIFFERENTIAL/PLATELET
BASO%: 0.3 % (ref 0.0–2.0)
Basophils Absolute: 0 10*3/uL (ref 0.0–0.1)
EOS%: 1.3 % (ref 0.0–7.0)
Eosinophils Absolute: 0.1 10*3/uL (ref 0.0–0.5)
HCT: 35.6 % (ref 34.8–46.6)
HGB: 11.9 g/dL (ref 11.6–15.9)
LYMPH%: 49.4 % (ref 14.0–49.7)
MCH: 32.9 pg (ref 25.1–34.0)
MCHC: 33.4 g/dL (ref 31.5–36.0)
MCV: 98.3 fL (ref 79.5–101.0)
MONO#: 0.5 10*3/uL (ref 0.1–0.9)
MONO%: 12.6 % (ref 0.0–14.0)
NEUT#: 1.4 10*3/uL — ABNORMAL LOW (ref 1.5–6.5)
NEUT%: 36.4 % — ABNORMAL LOW (ref 38.4–76.8)
Platelets: 171 10*3/uL (ref 145–400)
RBC: 3.62 10*6/uL — ABNORMAL LOW (ref 3.70–5.45)
RDW: 14.8 % — ABNORMAL HIGH (ref 11.2–14.5)
WBC: 3.9 10*3/uL (ref 3.9–10.3)
lymph#: 1.9 10*3/uL (ref 0.9–3.3)

## 2014-12-20 ENCOUNTER — Telehealth: Payer: Self-pay | Admitting: Oncology

## 2014-12-20 NOTE — Telephone Encounter (Signed)
Gave patient avs report and appointments for May thru July.

## 2014-12-26 ENCOUNTER — Other Ambulatory Visit (HOSPITAL_BASED_OUTPATIENT_CLINIC_OR_DEPARTMENT_OTHER): Payer: Medicaid - Out of State

## 2014-12-26 DIAGNOSIS — C3411 Malignant neoplasm of upper lobe, right bronchus or lung: Secondary | ICD-10-CM

## 2014-12-26 DIAGNOSIS — C3412 Malignant neoplasm of upper lobe, left bronchus or lung: Secondary | ICD-10-CM | POA: Diagnosis not present

## 2014-12-26 DIAGNOSIS — C3490 Malignant neoplasm of unspecified part of unspecified bronchus or lung: Secondary | ICD-10-CM

## 2014-12-26 LAB — COMPREHENSIVE METABOLIC PANEL (CC13)
ALT: 32 U/L (ref 0–55)
AST: 24 U/L (ref 5–34)
Albumin: 3.7 g/dL (ref 3.5–5.0)
Alkaline Phosphatase: 115 U/L (ref 40–150)
Anion Gap: 10 mEq/L (ref 3–11)
BUN: 8.3 mg/dL (ref 7.0–26.0)
CALCIUM: 9.1 mg/dL (ref 8.4–10.4)
CHLORIDE: 106 meq/L (ref 98–109)
CO2: 24 mEq/L (ref 22–29)
CREATININE: 0.7 mg/dL (ref 0.6–1.1)
GLUCOSE: 104 mg/dL (ref 70–140)
Potassium: 3.9 mEq/L (ref 3.5–5.1)
Sodium: 139 mEq/L (ref 136–145)
Total Bilirubin: 0.29 mg/dL (ref 0.20–1.20)
Total Protein: 7.2 g/dL (ref 6.4–8.3)

## 2014-12-26 LAB — CBC WITH DIFFERENTIAL/PLATELET
BASO%: 0.2 % (ref 0.0–2.0)
Basophils Absolute: 0 10*3/uL (ref 0.0–0.1)
EOS%: 0.8 % (ref 0.0–7.0)
Eosinophils Absolute: 0.1 10*3/uL (ref 0.0–0.5)
HCT: 36.9 % (ref 34.8–46.6)
HGB: 12.3 g/dL (ref 11.6–15.9)
LYMPH%: 38.3 % (ref 14.0–49.7)
MCH: 32.9 pg (ref 25.1–34.0)
MCHC: 33.3 g/dL (ref 31.5–36.0)
MCV: 98.7 fL (ref 79.5–101.0)
MONO#: 1 10*3/uL — ABNORMAL HIGH (ref 0.1–0.9)
MONO%: 16.2 % — ABNORMAL HIGH (ref 0.0–14.0)
NEUT#: 2.9 10*3/uL (ref 1.5–6.5)
NEUT%: 44.5 % (ref 38.4–76.8)
PLATELETS: 198 10*3/uL (ref 145–400)
RBC: 3.74 10*6/uL (ref 3.70–5.45)
RDW: 15.4 % — ABNORMAL HIGH (ref 11.2–14.5)
WBC: 6.4 10*3/uL (ref 3.9–10.3)
lymph#: 2.5 10*3/uL (ref 0.9–3.3)

## 2014-12-27 ENCOUNTER — Other Ambulatory Visit: Payer: Self-pay | Admitting: Internal Medicine

## 2014-12-28 ENCOUNTER — Encounter (HOSPITAL_COMMUNITY): Payer: Self-pay

## 2014-12-28 ENCOUNTER — Ambulatory Visit (HOSPITAL_COMMUNITY)
Admission: RE | Admit: 2014-12-28 | Discharge: 2014-12-28 | Disposition: A | Discharge status: Home / Self Care | Payer: Medicaid - Out of State | Source: Ambulatory Visit | Attending: Physician Assistant | Admitting: Physician Assistant

## 2014-12-28 DIAGNOSIS — C3490 Malignant neoplasm of unspecified part of unspecified bronchus or lung: Secondary | ICD-10-CM | POA: Insufficient documentation

## 2014-12-28 DIAGNOSIS — C3492 Malignant neoplasm of unspecified part of left bronchus or lung: Secondary | ICD-10-CM

## 2014-12-28 DIAGNOSIS — R911 Solitary pulmonary nodule: Secondary | ICD-10-CM | POA: Diagnosis not present

## 2014-12-28 MED ORDER — IOHEXOL 300 MG/ML  SOLN
100.0000 mL | Freq: Once | INTRAMUSCULAR | Status: AC | PRN
  Administered 2014-12-28: 100 mL via INTRAVENOUS

## 2014-12-31 ENCOUNTER — Other Ambulatory Visit: Payer: Self-pay | Admitting: Pulmonary Disease

## 2015-01-02 ENCOUNTER — Encounter: Payer: Self-pay | Admitting: *Deleted

## 2015-01-02 ENCOUNTER — Ambulatory Visit (HOSPITAL_BASED_OUTPATIENT_CLINIC_OR_DEPARTMENT_OTHER): Payer: Medicaid - Out of State | Admitting: Physician Assistant

## 2015-01-02 ENCOUNTER — Other Ambulatory Visit (HOSPITAL_BASED_OUTPATIENT_CLINIC_OR_DEPARTMENT_OTHER): Payer: Medicaid - Out of State

## 2015-01-02 ENCOUNTER — Ambulatory Visit: Payer: Medicaid - Out of State

## 2015-01-02 ENCOUNTER — Encounter: Payer: Self-pay | Admitting: Physician Assistant

## 2015-01-02 VITALS — BP 132/82 | HR 113 | Temp 98.4°F | Resp 21 | Ht 65.0 in | Wt 205.5 lb

## 2015-01-02 DIAGNOSIS — C3412 Malignant neoplasm of upper lobe, left bronchus or lung: Secondary | ICD-10-CM

## 2015-01-02 DIAGNOSIS — C3490 Malignant neoplasm of unspecified part of unspecified bronchus or lung: Secondary | ICD-10-CM

## 2015-01-02 DIAGNOSIS — F32A Depression, unspecified: Secondary | ICD-10-CM

## 2015-01-02 DIAGNOSIS — C3411 Malignant neoplasm of upper lobe, right bronchus or lung: Secondary | ICD-10-CM

## 2015-01-02 DIAGNOSIS — C3492 Malignant neoplasm of unspecified part of left bronchus or lung: Secondary | ICD-10-CM

## 2015-01-02 DIAGNOSIS — F329 Major depressive disorder, single episode, unspecified: Secondary | ICD-10-CM

## 2015-01-02 LAB — CBC WITH DIFFERENTIAL/PLATELET
BASO%: 0.1 % (ref 0.0–2.0)
BASOS ABS: 0 10*3/uL (ref 0.0–0.1)
EOS ABS: 0 10*3/uL (ref 0.0–0.5)
EOS%: 0.2 % (ref 0.0–7.0)
HCT: 35.7 % (ref 34.8–46.6)
HGB: 12 g/dL (ref 11.6–15.9)
LYMPH%: 26.3 % (ref 14.0–49.7)
MCH: 33.1 pg (ref 25.1–34.0)
MCHC: 33.6 g/dL (ref 31.5–36.0)
MCV: 98.6 fL (ref 79.5–101.0)
MONO#: 1.1 10*3/uL — AB (ref 0.1–0.9)
MONO%: 12.5 % (ref 0.0–14.0)
NEUT#: 5.4 10*3/uL (ref 1.5–6.5)
NEUT%: 60.9 % (ref 38.4–76.8)
Platelets: 286 10*3/uL (ref 145–400)
RBC: 3.62 10*6/uL — ABNORMAL LOW (ref 3.70–5.45)
RDW: 15.5 % — ABNORMAL HIGH (ref 11.2–14.5)
WBC: 8.9 10*3/uL (ref 3.9–10.3)
lymph#: 2.4 10*3/uL (ref 0.9–3.3)

## 2015-01-02 LAB — COMPREHENSIVE METABOLIC PANEL (CC13)
ALT: 34 U/L (ref 0–55)
ANION GAP: 10 meq/L (ref 3–11)
AST: 26 U/L (ref 5–34)
Albumin: 3.6 g/dL (ref 3.5–5.0)
Alkaline Phosphatase: 115 U/L (ref 40–150)
BUN: 6.4 mg/dL — ABNORMAL LOW (ref 7.0–26.0)
CO2: 23 meq/L (ref 22–29)
CREATININE: 0.7 mg/dL (ref 0.6–1.1)
Calcium: 9.2 mg/dL (ref 8.4–10.4)
Chloride: 108 mEq/L (ref 98–109)
EGFR: 88 mL/min/{1.73_m2} — ABNORMAL LOW (ref >90)
GLUCOSE: 138 mg/dL (ref 70–140)
Potassium: 3.5 mEq/L (ref 3.5–5.1)
Sodium: 141 mEq/L (ref 136–145)
Total Bilirubin: <0.2 mg/dL (ref 0.20–1.20)
Total Protein: 7 g/dL (ref 6.4–8.3)

## 2015-01-02 NOTE — Patient Instructions (Signed)
Your restaging CT scan revealed further improvement and the size of your right upper lobe lesion with overall stable disease. You're being referred for consideration for a clinical trial for maintenance chemotherapy. Your next follow-up appointment will be based on if you meet eligibility for the clinical trial and begin that study versus needing to be scheduled for maintenance single agent Alimta.

## 2015-01-02 NOTE — Progress Notes (Addendum)
[place] Telephone:(336) [phone]   Fax:(336) [phone]  OFFICE PROGRESS NOTE  No PCP Per Patient No address on file  DIAGNOSIS: stage IV (T2a, N0, M1a) non-small cell lung cancer, adenocarcinoma presented with bilateral pulmonary lesions. This could be also consider as synchronous primary tumors.  PRIOR THERAPY: None.  CURRENT THERAPY: Systemic chemotherapy with carboplatin for AUC of 5 and Alimta 500 MG/M2 every 3 weeks. Status post 6 cycles  INTERVAL HISTORY: Heather Banks 64 y.o. female returns to the clinic today for follow-up visit. Overall she's tolerating her chemotherapy relatively well without any significant adverse effects. She status post 6 cycles of chemotherapy with carboplatin and Alimta. She recently had a restaging CT scan of her chest, abdomen and pelvis and presents to discuss the results. She did note increased fatigue for a few days after chemotherapy. She denied having any significant weight loss or night sweats. She is eating well.    She denied having any significant nausea or vomiting.  She has no fever or chills. The patient denied having any significant chest pain, shortness breath, cough or hemoptysis.   MEDICAL HISTORY: Past Medical History  Diagnosis Date  . COPD (chronic obstructive pulmonary disease)     Albuterol neb as needed;Pulmicort neb daily  . GERD (gastroesophageal reflux disease)     takes Pantoprazole and Zantac daily  . Lung mass   . Pneumonia     hx of-last time about 4+yrs ago  . History of bronchitis     >5 yrs ago  . History of migraine     last one 2 wks ago  . Panic attacks     but doesn't take any meds    ALLERGIES:  has No Known Allergies.  MEDICATIONS:  Current Outpatient Prescriptions  Medication Sig Dispense Refill  . albuterol (PROVENTIL) (2.5 MG/3ML) 0.083% nebulizer solution Take 3 mLs (2.5 mg total) by nebulization every 6 (six) hours as needed for wheezing or shortness of breath. 75 mL 12  .  ALPRAZolam (XANAX) 0.5 MG tablet TAKE 1 TABLET BY MOUTH EVERY NIGHT AT BEDTIME AS NEEDED FOR ANXIETY 30 tablet 0  . aspirin-acetaminophen-caffeine (EXCEDRIN MIGRAINE) 202-542-70 MG per tablet Take 2 tablets by mouth every 6 (six) hours as needed for headache.    . dexamethasone (DECADRON) 4 MG tablet 4 mg by mouth twice a day the day before, day of and day after the chemotherapy every 3 weeks 40 tablet 1  . Fluticasone Furoate-Vilanterol 100-25 MCG/INH AEPB Inhale 1 puff into the lungs daily. 1 each 5  . folic acid (FOLVITE) 1 MG tablet Take 1 tablet (1 mg total) by mouth daily. 30 tablet 4  . HYDROcodone-acetaminophen (NORCO/VICODIN) 5-325 MG per tablet Take 1 tablet by mouth every 6 (six) hours as needed for moderate pain or severe pain. 30 tablet 0  . lidocaine-prilocaine (EMLA) cream Apply 1 application topically as needed. (Patient taking differently: Apply 1 application topically as needed (For port-a-cath.). ) 30 g 1  . mirtazapine (REMERON) 30 MG tablet TAKE 1 TABLET BY MOUTH EVERY NIGHT AT BEDTIME 30 tablet 0  . Multiple Vitamins-Minerals (MULTIVITAMIN WITH MINERALS) tablet Take 1 tablet by mouth daily.    . nicotine (NICODERM CQ) 21 mg/24hr patch Place 1 patch (21 mg total) onto the skin daily. 28 patch 0  . omeprazole (PRILOSEC) 20 MG capsule TAKE 1 CAPSULE BY MOUTH TWICE DAILY BEFORE A MEAL 60 capsule 0  . polyethylene glycol (MIRALAX / GLYCOLAX) packet Take 17 g  by mouth daily. Takes 2 caps every night    . PRESCRIPTION MEDICATION She receives her treatments at the [facility]. Her oncologist is Dr. Julien Nordmann. She received Alimta 996m and Carboplatin 562mon 08/30/14.    . Marland Kitchenrochlorperazine (COMPAZINE) 10 MG tablet Take 1 tablet (10 mg total) by mouth every 6 (six) hours as needed for nausea or vomiting. 60 tablet 0   No current facility-administered medications for this visit.    SURGICAL HISTORY:  Past Surgical History  Procedure Laterality Date  . Abdominal  hysterectomy    . Oophorectomy    . Appendectomy    . Tonsillectomy    . Video bronchoscopy with endobronchial navigation N/A 08/14/2014    Procedure: VIDEO BRONCHOSCOPY WITH ENDOBRONCHIAL NAVIGATION;  Surgeon: RaRigoberto NoelMD;  [place] Service: Thoracic;  Laterality: N/A;  . Portacath placement  jan. 2016    REVIEW OF SYSTEMS:  A comprehensive review of systems was negative except for: Constitutional: positive for fatigue Gastrointestinal: positive for reflux symptoms   PHYSICAL EXAMINATION: General appearance: alert, cooperative and no distress Head: Normocephalic, without obvious abnormality, atraumatic Neck: no adenopathy, no JVD, supple, symmetrical, trachea midline and thyroid not enlarged, symmetric, no tenderness/mass/nodules Lymph nodes: Cervical, supraclavicular, and axillary nodes normal. Resp: clear to auscultation bilaterally Back: symmetric, no curvature. ROM normal. No CVA tenderness. Cardio: regular rate and rhythm, S1, S2 normal, no murmur, click, rub or gallop GI: soft, non-tender; bowel sounds normal; no masses,  no organomegaly Extremities: extremities normal, atraumatic, no cyanosis or edema  ECOG PERFORMANCE STATUS: 1 - Symptomatic but completely ambulatory  Blood pressure 132/82, pulse 113, temperature 98.4 F (36.9 C), temperature source Oral, resp. rate 21, height 5' 5"  (1.651 m), weight 205 lb 8 oz (93.214 kg), SpO2 100 %.  LABORATORY DATA: Lab Results  Component Value Date   WBC 8.9 01/02/2015   HGB 12.0 01/02/2015   HCT 35.7 01/02/2015   MCV 98.6 01/02/2015   PLT 286 01/02/2015      Chemistry      Component Value Date/Time   NA 141 01/02/2015 1129   NA 138 08/11/2014 0837   K 3.5 01/02/2015 1129   K 3.8 08/11/2014 0837   CL 103 08/11/2014 0837   CO2 23 01/02/2015 1129   CO2 23 08/11/2014 0837   BUN 6.4* 01/02/2015 1129   BUN 8 08/11/2014 0837   CREATININE 0.7 01/02/2015 1129   CREATININE 0.57 08/11/2014 0837      Component  Value Date/Time   CALCIUM 9.2 01/02/2015 1129   CALCIUM 9.0 08/11/2014 0837   ALKPHOS 115 01/02/2015 1129   ALKPHOS 145* 07/10/2014 0459   AST 26 01/02/2015 1129   AST 11 07/10/2014 0459   ALT 34 01/02/2015 1129   ALT 8 07/10/2014 0459   BILITOT <0.20 01/02/2015 1129   BILITOT <0.2* 07/10/2014 0459       RADIOGRAPHIC STUDIES: Ct Chest W Contrast  12/28/2014   CLINICAL DATA:  Subsequent treatment strategy for lung carcinoma.  EXAM: CT CHEST, ABDOMEN, AND PELVIS WITH CONTRAST  TECHNIQUE: Multidetector CT imaging of the chest, abdomen and pelvis was performed following the standard protocol during bolus administration of intravenous contrast.  CONTRAST:  100106mMNIPAQUE IOHEXOL 300 MG/ML  SOLN  COMPARISON:  CT 10/30/2014, PET-CT 07/31/2014  FINDINGS: CT CHEST FINDINGS  Mediastinum/Nodes: No axillary supraclavicular lymphadenopathy. No mediastinal hilar lymphadenopathy. Prevascular lymph node measures 6 mm unchanged. No central pulmonary embolism. No pericardial fluid. Esophagus normal.  Lungs/Pleura: Left  upper lobe mass measures 39 x 35 mm compared to 39 by 34 mm on prior.  Right upper lobe nodule measures 13 x 14 mm compared to 18 x 15 mm.  Mild nodular reticulation within the right middle lobe is not changed (image 22, series 5). No new pulmonary nodules.  CT ABDOMEN AND PELVIS FINDINGS  Hepatobiliary: No focal hepatic lesion. No biliary duct dilatation. Gallbladder is normal. Common bile duct is normal.  Pancreas: Pancreas is normal. No ductal dilatation. No pancreatic inflammation.  Spleen: Normal spleen  Adrenals/urinary tract: Mild thickening of the left adrenal gland is unchanged. Nonenhancing cysts of the right kidney is unchanged.  Stomach/Bowel: Small hiatal hernia.  Vascular/Lymphatic: Abdominal aorta is normal caliber. There is no retroperitoneal or periportal lymphadenopathy. No pelvic lymphadenopathy.  Reproductive: Post hysterectomy anatomy.  Musculoskeletal: No aggressive osseous  lesion.  Other: No free fluid.  IMPRESSION: Chest Impression:  1. Continued decrease in volume of right upper lobe nodule. 2. No significant change in size of left upper lobe mass. 3. Reticular nodular pattern in the right middle lobe is stable favored to be benign. 4. No evidence disease progression in the thorax.  Abdomen / Pelvis Impression:  1. No evidence of metastatic disease in the abdomen pelvis. 2. No skeletal metastasis   Electronically Signed   By: Suzy Bouchard M.D.   On: 12/28/2014 13:06   Ct Abdomen Pelvis W Contrast  12/28/2014   CLINICAL DATA:  Subsequent treatment strategy for lung carcinoma.  EXAM: CT CHEST, ABDOMEN, AND PELVIS WITH CONTRAST  TECHNIQUE: Multidetector CT imaging of the chest, abdomen and pelvis was performed following the standard protocol during bolus administration of intravenous contrast.  CONTRAST:  153m OMNIPAQUE IOHEXOL 300 MG/ML  SOLN  COMPARISON:  CT 10/30/2014, PET-CT 07/31/2014  FINDINGS: CT CHEST FINDINGS  Mediastinum/Nodes: No axillary supraclavicular lymphadenopathy. No mediastinal hilar lymphadenopathy. Prevascular lymph node measures 6 mm unchanged. No central pulmonary embolism. No pericardial fluid. Esophagus normal.  Lungs/Pleura: Left upper lobe mass measures 39 x 35 mm compared to 39 by 34 mm on prior.  Right upper lobe nodule measures 13 x 14 mm compared to 18 x 15 mm.  Mild nodular reticulation within the right middle lobe is not changed (image 22, series 5). No new pulmonary nodules.  CT ABDOMEN AND PELVIS FINDINGS  Hepatobiliary: No focal hepatic lesion. No biliary duct dilatation. Gallbladder is normal. Common bile duct is normal.  Pancreas: Pancreas is normal. No ductal dilatation. No pancreatic inflammation.  Spleen: Normal spleen  Adrenals/urinary tract: Mild thickening of the left adrenal gland is unchanged. Nonenhancing cysts of the right kidney is unchanged.  Stomach/Bowel: Small hiatal hernia.  Vascular/Lymphatic: Abdominal aorta is normal  caliber. There is no retroperitoneal or periportal lymphadenopathy. No pelvic lymphadenopathy.  Reproductive: Post hysterectomy anatomy.  Musculoskeletal: No aggressive osseous lesion.  Other: No free fluid.  IMPRESSION: Chest Impression:  1. Continued decrease in volume of right upper lobe nodule. 2. No significant change in size of left upper lobe mass. 3. Reticular nodular pattern in the right middle lobe is stable favored to be benign. 4. No evidence disease progression in the thorax.  Abdomen / Pelvis Impression:  1. No evidence of metastatic disease in the abdomen pelvis. 2. No skeletal metastasis   Electronically Signed   By: SSuzy BouchardM.D.   On: 12/28/2014 13:06    ASSESSMENT AND PLAN: This is a very pleasant 64years old white female with stage IV non-small cell lung cancer currently undergoing systemic chemotherapy  with carboplatin and Alimta status post 6 cycles. The patient tolerated the last cycle of her treatment fairly well. Her recent restaging CT scan revealed further interval decrease in the size of her right upper lobe lesion with no new or progressive disease identified in the chest abdomen or pelvis.  Patient was discussed with and also seen by Dr. Julien Nordmann. As her disease is improved/stable she will be referred for consideration for BMS checked in 02/27/1969 clinical trial with Alimta versus nivolumab versus Alimta + nivolumab. She is interested in pretty spinning in the clinical trial and met with one of our clinical research staff today. If she is eligible for the study we will schedule her follow-up accordingly per protocol. If she is not eligible for the protocol we will have her return in one to 2 weeks to begin maintenance chemotherapy with single agent Alimta at 500 mg/m given every 3 weeks. For depression, she will continue on Remeron 30 mg by mouth daily at bedtime. For the acid reflux, she will continue on Prilosec 20 mg by mouth twice a day.  She was advised to call  immediately if she has any concerning symptoms in the interval. The patient voices understanding of current disease status and treatment options and is in agreement with the current care plan.  All questions were answered. The patient knows to call the clinic with any problems, questions or concerns. We can certainly see the patient much sooner if necessary.  Carlton Adam, PA-C 01/02/2015  ADDENDUM: Hematology/Oncology Attending: I had a face to face encounter with the patient today. I recommended his care plan. This is a very pleasant 63 years old white female with metastatic non-small cell lung cancer, adenocarcinoma who completed 6 cycles of induction chemotherapy with carboplatin and Alimta with no evidence for disease progression after cycle #6. The patient tolerated her treatment fairly well with no significant adverse effects except for mild fatigue. I discussed the scan results with the patient. I also discussed with her other treatment options including maintenance chemotherapy with single agent Alimta versus observation versus enrollment in the BMS CheckMate 370 clinical trial with the patient would be randomized for maintenance treatment with Alimta versus Nivolumab versus Alimta and Nivolumab. She is interested in the clinical trial and she will be seen by the clinical research nurse later today for evaluation of eligibility. We will arrange a follow-up visit based on her eligibility for the clinical trial. She was advised to call immediately if she has any concerning symptoms.  Disclaimer: This note was dictated with voice recognition software. Similar sounding words can inadvertently be transcribed and may be missed upon review. [name]., MD 01/02/2015

## 2015-01-04 ENCOUNTER — Telehealth: Payer: Self-pay | Admitting: Pulmonary Disease

## 2015-01-04 DIAGNOSIS — C801 Malignant (primary) neoplasm, unspecified: Secondary | ICD-10-CM

## 2015-01-04 NOTE — Telephone Encounter (Signed)
Called and spoke to pt. Pt requesting albuterol HFA to use prn and also an order to d/c her O2. Pt stated she has not used her O2 since February nor has she needed it. Pt stated she her spO2 has maintained above 94%.   RA please advise.

## 2015-01-04 NOTE — Telephone Encounter (Signed)
Okay to discontinue oxygen Okay for albuterol prescription

## 2015-01-05 MED ORDER — ALBUTEROL SULFATE (2.5 MG/3ML) 0.083% IN NEBU: 2.5000 mg | INHALATION_SOLUTION | Freq: Four times a day (QID) | RESPIRATORY_TRACT | Status: DC | PRN

## 2015-01-05 NOTE — Telephone Encounter (Signed)
Patient notified.  Rx sent to pharmacy, order for d/c oxygen sent to [facility]. Nothing further needed.

## 2015-01-08 ENCOUNTER — Other Ambulatory Visit: Payer: Self-pay | Admitting: Medical Oncology

## 2015-01-08 DIAGNOSIS — C3490 Malignant neoplasm of unspecified part of unspecified bronchus or lung: Secondary | ICD-10-CM

## 2015-01-09 ENCOUNTER — Other Ambulatory Visit: Payer: Self-pay | Admitting: Medical Oncology

## 2015-01-09 ENCOUNTER — Encounter: Payer: Medicaid - Out of State | Admitting: Medical Oncology

## 2015-01-09 ENCOUNTER — Other Ambulatory Visit (HOSPITAL_BASED_OUTPATIENT_CLINIC_OR_DEPARTMENT_OTHER): Payer: Medicaid - Out of State

## 2015-01-09 ENCOUNTER — Ambulatory Visit: Payer: Medicaid - Out of State

## 2015-01-09 ENCOUNTER — Other Ambulatory Visit: Payer: Self-pay

## 2015-01-09 DIAGNOSIS — C3490 Malignant neoplasm of unspecified part of unspecified bronchus or lung: Secondary | ICD-10-CM

## 2015-01-09 DIAGNOSIS — Z79899 Other long term (current) drug therapy: Secondary | ICD-10-CM | POA: Diagnosis not present

## 2015-01-09 LAB — CBC WITH DIFFERENTIAL/PLATELET
BASO%: 0.2 % (ref 0.0–2.0)
BASOS ABS: 0 10*3/uL (ref 0.0–0.1)
EOS ABS: 0.1 10*3/uL (ref 0.0–0.5)
EOS%: 0.6 % (ref 0.0–7.0)
HCT: 37.4 % (ref 34.8–46.6)
HEMOGLOBIN: 12.4 g/dL (ref 11.6–15.9)
LYMPH%: 24.8 % (ref 14.0–49.7)
MCH: 32.9 pg (ref 25.1–34.0)
MCHC: 33.2 g/dL (ref 31.5–36.0)
MCV: 99.2 fL (ref 79.5–101.0)
MONO#: 1.3 10*3/uL — ABNORMAL HIGH (ref 0.1–0.9)
MONO%: 14 % (ref 0.0–14.0)
NEUT%: 60.4 % (ref 38.4–76.8)
NEUTROS ABS: 5.5 10*3/uL (ref 1.5–6.5)
PLATELETS: 237 10*3/uL (ref 145–400)
RBC: 3.77 10*6/uL (ref 3.70–5.45)
RDW: 15.2 % — ABNORMAL HIGH (ref 11.2–14.5)
WBC: 9 10*3/uL (ref 3.9–10.3)
lymph#: 2.2 10*3/uL (ref 0.9–3.3)

## 2015-01-09 LAB — COMPREHENSIVE METABOLIC PANEL (CC13)
ALBUMIN: 3.4 g/dL — AB (ref 3.5–5.0)
ALT: 30 U/L (ref 0–55)
AST: 26 U/L (ref 5–34)
Alkaline Phosphatase: 109 U/L (ref 40–150)
Anion Gap: 13 mEq/L — ABNORMAL HIGH (ref 3–11)
BUN: 7.3 mg/dL (ref 7.0–26.0)
CO2: 23 mEq/L (ref 22–29)
Calcium: 9 mg/dL (ref 8.4–10.4)
Chloride: 105 mEq/L (ref 98–109)
Creatinine: 0.7 mg/dL (ref 0.6–1.1)
GLUCOSE: 102 mg/dL (ref 70–140)
POTASSIUM: 3.8 meq/L (ref 3.5–5.1)
Sodium: 141 mEq/L (ref 136–145)
Total Bilirubin: 0.26 mg/dL (ref 0.20–1.20)
Total Protein: 7.1 g/dL (ref 6.4–8.3)

## 2015-01-09 LAB — HEPATITIS C ANTIBODY: HCV Ab: NEGATIVE

## 2015-01-09 LAB — TSH CHCC: TSH: 1.503 m[IU]/L (ref 0.308–3.960)

## 2015-01-09 LAB — MAGNESIUM (CC13): Magnesium: 2.1 mg/dl (ref 1.5–2.5)

## 2015-01-09 LAB — HEPATITIS B SURFACE ANTIGEN: Hepatitis B Surface Ag: NEGATIVE

## 2015-01-09 LAB — T4, FREE: Free T4: 0.94 ng/dL (ref 0.80–1.80)

## 2015-01-09 LAB — T3, FREE: T3 FREE: 3.2 pg/mL (ref 2.3–4.2)

## 2015-01-09 LAB — LACTATE DEHYDROGENASE (CC13): LDH: 193 U/L (ref 125–245)

## 2015-01-09 LAB — PHOSPHORUS: PHOSPHORUS: 3.3 mg/dL (ref 2.3–4.6)

## 2015-01-11 ENCOUNTER — Telehealth: Payer: Self-pay | Admitting: Medical Oncology

## 2015-01-12 ENCOUNTER — Telehealth: Payer: Self-pay | Admitting: Internal Medicine

## 2015-01-12 ENCOUNTER — Other Ambulatory Visit: Payer: Self-pay | Admitting: Medical Oncology

## 2015-01-12 DIAGNOSIS — C3490 Malignant neoplasm of unspecified part of unspecified bronchus or lung: Secondary | ICD-10-CM

## 2015-01-12 NOTE — Telephone Encounter (Signed)
Called and left a message with split appointment information

## 2015-01-15 ENCOUNTER — Other Ambulatory Visit: Payer: Self-pay | Admitting: Medical Oncology

## 2015-01-15 DIAGNOSIS — C3490 Malignant neoplasm of unspecified part of unspecified bronchus or lung: Secondary | ICD-10-CM

## 2015-01-16 ENCOUNTER — Ambulatory Visit (HOSPITAL_BASED_OUTPATIENT_CLINIC_OR_DEPARTMENT_OTHER): Payer: Medicaid - Out of State

## 2015-01-16 ENCOUNTER — Encounter: Payer: Self-pay | Admitting: Medical Oncology

## 2015-01-16 ENCOUNTER — Encounter: Payer: Self-pay | Admitting: Physician Assistant

## 2015-01-16 ENCOUNTER — Encounter: Payer: Self-pay | Admitting: Internal Medicine

## 2015-01-16 ENCOUNTER — Ambulatory Visit (HOSPITAL_BASED_OUTPATIENT_CLINIC_OR_DEPARTMENT_OTHER): Payer: Medicaid - Out of State | Admitting: Physician Assistant

## 2015-01-16 ENCOUNTER — Telehealth: Payer: Self-pay | Admitting: *Deleted

## 2015-01-16 ENCOUNTER — Telehealth: Payer: Self-pay | Admitting: Physician Assistant

## 2015-01-16 ENCOUNTER — Other Ambulatory Visit: Payer: Medicaid - Out of State

## 2015-01-16 VITALS — BP 147/92 | HR 97 | Temp 98.1°F | Resp 19 | Ht 65.0 in | Wt 206.3 lb

## 2015-01-16 DIAGNOSIS — Z006 Encounter for examination for normal comparison and control in clinical research program: Secondary | ICD-10-CM | POA: Diagnosis not present

## 2015-01-16 DIAGNOSIS — F329 Major depressive disorder, single episode, unspecified: Secondary | ICD-10-CM

## 2015-01-16 DIAGNOSIS — C3490 Malignant neoplasm of unspecified part of unspecified bronchus or lung: Secondary | ICD-10-CM

## 2015-01-16 DIAGNOSIS — C3411 Malignant neoplasm of upper lobe, right bronchus or lung: Secondary | ICD-10-CM | POA: Diagnosis not present

## 2015-01-16 DIAGNOSIS — C3412 Malignant neoplasm of upper lobe, left bronchus or lung: Secondary | ICD-10-CM | POA: Diagnosis not present

## 2015-01-16 DIAGNOSIS — Z5111 Encounter for antineoplastic chemotherapy: Secondary | ICD-10-CM

## 2015-01-16 LAB — RESEARCH LABS

## 2015-01-16 MED ORDER — ALPRAZOLAM 0.5 MG PO TABS: 0.5000 mg | ORAL_TABLET | Freq: Every evening | ORAL | Status: DC | PRN

## 2015-01-16 MED ORDER — PROCHLORPERAZINE MALEATE 10 MG PO TABS: 10.0000 mg | ORAL_TABLET | Freq: Four times a day (QID) | ORAL | Status: DC | PRN

## 2015-01-16 MED ORDER — CYANOCOBALAMIN 1000 MCG/ML IJ SOLN
1000.0000 ug | Freq: Once | INTRAMUSCULAR | Status: AC
  Administered 2015-01-16: 1000 ug via INTRAMUSCULAR

## 2015-01-16 NOTE — Progress Notes (Addendum)
[place] [phone]   [phone]  OFFICE PROGRESS NOTE  No PCP Per Patient No address on file  DIAGNOSIS: stage IV (T2a, N0, M1a) non-small cell lung cancer, adenocarcinoma presented with bilateral pulmonary lesions. This could be also consider as synchronous primary tumors.  PRIOR THERAPY: Systemic chemotherapy with carboplatin for AUC of 5 and Alimta 500 MG/M2 every 3 weeks. Status post 6 cycles  CURRENT THERAPY: Participation and the BMS checkmate 370 clinical trial. Randomized to group a arm be 1 treatment with pemetrexed (Alimta) 500 mg/m given every 3 weeks.  INTERVAL HISTORY: Heather Banks 64 y.o. female returns to the clinic today for follow-up visit. She presents to proceed with treatment according to the be MS checkmated 370 clinical trial and has been randomized to pemetrexed every 3 weeks. She requests a refill for her Xanax that she takes for occasional anxiety as well as a refill for her Compazine that manages her nausea well. . She denied having any significant weight loss or night sweats. She is eating well.    She denied having any significant nausea or vomiting.  She has no fever or chills. The patient denied having any significant chest pain, shortness breath, cough or hemoptysis. 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  MEDICAL HISTORY: Past Medical History  Diagnosis Date  . COPD (chronic obstructive pulmonary disease)     Albuterol neb as needed;Pulmicort neb daily  . GERD (gastroesophageal reflux disease)     takes Pantoprazole and Zantac daily  . Lung mass   . Pneumonia     hx of-last time about 4+yrs ago  . History of bronchitis     >5 yrs ago  . History of migraine     last one 2 wks ago  . Panic attacks     but doesn't take any meds    ALLERGIES:  has No Known Allergies.  MEDICATIONS:  Current Outpatient  Prescriptions  Medication Sig Dispense Refill  . albuterol (PROVENTIL) (2.5 MG/3ML) 0.083% nebulizer solution Take 3 mLs (2.5 mg total) by nebulization every 6 (six) hours as needed for wheezing or shortness of breath. 75 mL 12  . ALPRAZolam (XANAX) 0.5 MG tablet TAKE 1 TABLET BY MOUTH EVERY NIGHT AT BEDTIME AS NEEDED FOR ANXIETY 30 tablet 0  . ALPRAZolam (XANAX) 0.5 MG tablet Take 1 tablet (0.5 mg total) by mouth at bedtime as needed for anxiety. 30 tablet 0  . aspirin-acetaminophen-caffeine (EXCEDRIN MIGRAINE) 937-902-40 MG per tablet Take 2 tablets by mouth every 6 (six) hours as needed for headache.    . dexamethasone (DECADRON) 4 MG tablet 4 mg by mouth twice a day the day before, day of and day after the chemotherapy every 3 weeks 40 tablet 1  . Fluticasone Furoate-Vilanterol 100-25 MCG/INH AEPB Inhale 1 puff into the lungs daily. 1 each 5  . folic acid (FOLVITE) 1 MG tablet Take 1 tablet (1 mg total) by mouth daily. 30 tablet 4  . HYDROcodone-acetaminophen (NORCO/VICODIN) 5-325 MG per tablet Take 1 tablet by mouth every 6 (six) hours as needed for moderate pain or severe pain. 30 tablet 0  . lidocaine-prilocaine (EMLA) cream Apply 1 application topically as needed. (Patient taking differently: Apply 1 application topically as needed (For port-a-cath.). ) 30 g 1  . mirtazapine (REMERON) 30 MG tablet TAKE 1 TABLET BY MOUTH EVERY NIGHT AT BEDTIME 30 tablet 0  .  Multiple Vitamins-Minerals (MULTIVITAMIN WITH MINERALS) tablet Take 1 tablet by mouth daily.    . nicotine (NICODERM CQ) 21 mg/24hr patch Place 1 patch (21 mg total) onto the skin daily. 28 patch 0  . omeprazole (PRILOSEC) 20 MG capsule TAKE 1 CAPSULE BY MOUTH TWICE DAILY BEFORE A MEAL 60 capsule 0  . polyethylene glycol (MIRALAX / GLYCOLAX) packet Take 17 g by mouth daily. Takes 2 caps every night    . PRESCRIPTION MEDICATION She receives her treatments at the [facility]. Her oncologist is Dr. Julien Nordmann. She received  Alimta '925mg'$  and Carboplatin '560mg'$  on 08/30/14.    Marland Kitchen prochlorperazine (COMPAZINE) 10 MG tablet Take 1 tablet (10 mg total) by mouth every 6 (six) hours as needed for nausea or vomiting. 60 tablet 0   No current facility-administered medications for this visit.    SURGICAL HISTORY:  Past Surgical History  Procedure Laterality Date  . Abdominal hysterectomy    . Oophorectomy    . Appendectomy    . Tonsillectomy    . Video bronchoscopy with endobronchial navigation N/A 08/14/2014    Procedure: VIDEO BRONCHOSCOPY WITH ENDOBRONCHIAL NAVIGATION;  Surgeon: Rigoberto Noel, MD;  Location: [place];  Service: Thoracic;  Laterality: N/A;  . Portacath placement  jan. 2016    REVIEW OF SYSTEMS:  A comprehensive review of systems was negative except for: Constitutional: positive for fatigue Gastrointestinal: positive for reflux symptoms   PHYSICAL EXAMINATION: General appearance: alert, cooperative and no distress Head: Normocephalic, without obvious abnormality, atraumatic Neck: no adenopathy, no JVD, supple, symmetrical, trachea midline and thyroid not enlarged, symmetric, no tenderness/mass/nodules Lymph nodes: Cervical, supraclavicular, and axillary nodes normal. Resp: clear to auscultation bilaterally Back: symmetric, no curvature. ROM normal. No CVA tenderness. Cardio: regular rate and rhythm, S1, S2 normal, no murmur, click, rub or gallop GI: soft, non-tender; bowel sounds normal; no masses,  no organomegaly Extremities: extremities normal, atraumatic, no cyanosis or edema and edema trace to 1+ soft tissue edema  ECOG PERFORMANCE STATUS: 1 - Symptomatic but completely ambulatory  Blood pressure 147/92, pulse 97, temperature 98.1 F (36.7 C), temperature source Oral, resp. rate 19, height '5\' 5"'$  (1.651 m), weight 206 lb 4.8 oz (93.577 kg), SpO2 98 %.  LABORATORY DATA: Lab Results  Component Value Date   WBC 9.0 01/09/2015   HGB 12.4 01/09/2015   HCT 37.4 01/09/2015   MCV 99.2 01/09/2015    PLT 237 01/09/2015      Chemistry      Component Value Date/Time   NA 141 01/09/2015 1017   NA 138 08/11/2014 0837   K 3.8 01/09/2015 1017   K 3.8 08/11/2014 0837   CL 103 08/11/2014 0837   CO2 23 01/09/2015 1017   CO2 23 08/11/2014 0837   BUN 7.3 01/09/2015 1017   BUN 8 08/11/2014 0837   CREATININE 0.7 01/09/2015 1017   CREATININE 0.57 08/11/2014 0837      Component Value Date/Time   CALCIUM 9.0 01/09/2015 1017   CALCIUM 9.0 08/11/2014 0837   ALKPHOS 109 01/09/2015 1017   ALKPHOS 145* 07/10/2014 0459   AST 26 01/09/2015 1017   AST 11 07/10/2014 0459   ALT 30 01/09/2015 1017   ALT 8 07/10/2014 0459   BILITOT 0.26 01/09/2015 1017   BILITOT <0.2* 07/10/2014 0459       RADIOGRAPHIC STUDIES: Ct Chest W Contrast  12/28/2014   CLINICAL DATA:  Subsequent treatment strategy for lung carcinoma.  EXAM: CT CHEST, ABDOMEN, AND PELVIS WITH CONTRAST  TECHNIQUE: Multidetector CT imaging of the chest, abdomen and pelvis was performed following the standard protocol during bolus administration of intravenous contrast.  CONTRAST:  151m OMNIPAQUE IOHEXOL 300 MG/ML  SOLN  COMPARISON:  CT 10/30/2014, PET-CT 07/31/2014  FINDINGS: CT CHEST FINDINGS  Mediastinum/Nodes: No axillary supraclavicular lymphadenopathy. No mediastinal hilar lymphadenopathy. Prevascular lymph node measures 6 mm unchanged. No central pulmonary embolism. No pericardial fluid. Esophagus normal.  Lungs/Pleura: Left upper lobe mass measures 39 x 35 mm compared to 39 by 34 mm on prior.  Right upper lobe nodule measures 13 x 14 mm compared to 18 x 15 mm.  Mild nodular reticulation within the right middle lobe is not changed (image 22, series 5). No new pulmonary nodules.  CT ABDOMEN AND PELVIS FINDINGS  Hepatobiliary: No focal hepatic lesion. No biliary duct dilatation. Gallbladder is normal. Common bile duct is normal.  Pancreas: Pancreas is normal. No ductal dilatation. No pancreatic inflammation.  Spleen: Normal spleen   Adrenals/urinary tract: Mild thickening of the left adrenal gland is unchanged. Nonenhancing cysts of the right kidney is unchanged.  Stomach/Bowel: Small hiatal hernia.  Vascular/Lymphatic: Abdominal aorta is normal caliber. There is no retroperitoneal or periportal lymphadenopathy. No pelvic lymphadenopathy.  Reproductive: Post hysterectomy anatomy.  Musculoskeletal: No aggressive osseous lesion.  Other: No free fluid.  IMPRESSION: Chest Impression:  1. Continued decrease in volume of right upper lobe nodule. 2. No significant change in size of left upper lobe mass. 3. Reticular nodular pattern in the right middle lobe is stable favored to be benign. 4. No evidence disease progression in the thorax.  Abdomen / Pelvis Impression:  1. No evidence of metastatic disease in the abdomen pelvis. 2. No skeletal metastasis   Electronically Signed   By: SSuzy BouchardM.D.   On: 12/28/2014 13:06   Ct Abdomen Pelvis W Contrast  12/28/2014   CLINICAL DATA:  Subsequent treatment strategy for lung carcinoma.  EXAM: CT CHEST, ABDOMEN, AND PELVIS WITH CONTRAST  TECHNIQUE: Multidetector CT imaging of the chest, abdomen and pelvis was performed following the standard protocol during bolus administration of intravenous contrast.  CONTRAST:  1069mOMNIPAQUE IOHEXOL 300 MG/ML  SOLN  COMPARISON:  CT 10/30/2014, PET-CT 07/31/2014  FINDINGS: CT CHEST FINDINGS  Mediastinum/Nodes: No axillary supraclavicular lymphadenopathy. No mediastinal hilar lymphadenopathy. Prevascular lymph node measures 6 mm unchanged. No central pulmonary embolism. No pericardial fluid. Esophagus normal.  Lungs/Pleura: Left upper lobe mass measures 39 x 35 mm compared to 39 by 34 mm on prior.  Right upper lobe nodule measures 13 x 14 mm compared to 18 x 15 mm.  Mild nodular reticulation within the right middle lobe is not changed (image 22, series 5). No new pulmonary nodules.  CT ABDOMEN AND PELVIS FINDINGS  Hepatobiliary: No focal hepatic lesion. No biliary  duct dilatation. Gallbladder is normal. Common bile duct is normal.  Pancreas: Pancreas is normal. No ductal dilatation. No pancreatic inflammation.  Spleen: Normal spleen  Adrenals/urinary tract: Mild thickening of the left adrenal gland is unchanged. Nonenhancing cysts of the right kidney is unchanged.  Stomach/Bowel: Small hiatal hernia.  Vascular/Lymphatic: Abdominal aorta is normal caliber. There is no retroperitoneal or periportal lymphadenopathy. No pelvic lymphadenopathy.  Reproductive: Post hysterectomy anatomy.  Musculoskeletal: No aggressive osseous lesion.  Other: No free fluid.  IMPRESSION: Chest Impression:  1. Continued decrease in volume of right upper lobe nodule. 2. No significant change in size of left upper lobe mass. 3. Reticular nodular pattern in the right middle lobe is stable favored to  be benign. 4. No evidence disease progression in the thorax.  Abdomen / Pelvis Impression:  1. No evidence of metastatic disease in the abdomen pelvis. 2. No skeletal metastasis   Electronically Signed   By: Suzy Bouchard M.D.   On: 12/28/2014 13:06    ASSESSMENT AND PLAN: This is a very pleasant 64 years old white female with stage IV non-small cell lung cancer currently undergoing systemic chemotherapy with carboplatin and Alimta status post 6 cycles. The patient tolerated the last cycle of her treatment fairly well. Her recent restaging CT scan revealed further interval decrease in the size of her right upper lobe lesion with no new or progressive disease identified in the chest abdomen or pelvis.  Patient was discussed with and also seen by Dr. Julien Nordmann. As her disease is improved/stable she was accepted and enrolled in the BMS checked in 02/27/1969 clinical trial with Alimta versus nivolumab versus Alimta + nivolumab. She has been randomized to the Alimta arm. She will proceed for cycle 1 as scheduled. She'll receive B 12 injection today She'll follow-up as per protocol. For depression, she will  continue on Remeron 30 mg by mouth daily at bedtime. For the acid reflux, she will continue on Prilosec 20 mg by mouth twice a day. She was given a refill for her Xanax tablets 0.5 mg tablets as well as her Compazine was also refilled.  She was advised to call immediately if she has any concerning symptoms in the interval. The patient voices understanding of current disease status and treatment options and is in agreement with the current care plan.  All questions were answered. The patient knows to call the clinic with any problems, questions or concerns. We can certainly see the patient much sooner if necessary.  Carlton Adam, PA-C 01/16/2015  ADDENDUM: Hematology/Oncology Attending: I had a face to face encounter with the patient. I recommended his care plan. This is a very pleasant 64 years old white female with metastatic non-small cell lung cancer, adenocarcinoma who completed 6 cycles of induction chemotherapy with carboplatin and Alimta. The patient is improved and the BMS checked metastases 370 clinical trial and she was randomized to the maintenance treatment with single agent Alimta. She will start the first cycle of her treatment today. I recommended for the patient to proceed with her treatment as scheduled. For depression she will continue on Remeron 30 mg by mouth daily at bedtime. She was also given a refill of Xanax and Compazine. The patient would come back for follow-up visit in 3 weeks for reevaluation before starting cycle #2. She was advised to call immediately if she has any concerning symptoms in the interval.  Disclaimer: This note was dictated with voice recognition software. Similar sounding words can inadvertently be transcribed and may not be corrected upon review. [name]., MD 01/21/2015

## 2015-01-16 NOTE — Telephone Encounter (Signed)
Per staff message and POF I have scheduled appts. Advised scheduler of appts. JMW  

## 2015-01-16 NOTE — Progress Notes (Signed)
BMS [id number] - questionnaires (PROs) - patient into the cancer center for routine visit. Patient was given PROs upon arrival to the cancer center. The patient completed her PROs (EQ-5D-3L first and then the FACT-L) before any study procedures were performed. I checked the PROs for completeness.  Research blood was drawn.  The patient was thanked for her continued support of this clinical trial.

## 2015-01-16 NOTE — Telephone Encounter (Signed)
per pof to sch pt trmt-sent Mw email to sch trmt-will call pt after reply

## 2015-01-16 NOTE — Progress Notes (Signed)
BMS 370: Group A: Arm B-1 Patient here today for start of Cycle 1, lab appointment and office visit and cycle 1 treatment to start tomorrow (6/21). I met with patient, after she completed study required PRO's and research labs, in exam room with PA, Awilda Metro and Dr. Julien Nordmann. Reviewed concomitant medications and baseline A/E's again, as well as patient's smoking history. Patient reports no new changes since my last visit with her. Per Dr. Julien Nordmann,  patient is clear to proceed with Cycle 1 of on-study Alimta tomorrow as scheduled. Dr Julien Nordmann reminded patient of instructions on how and when to take decadron and folic acid while on treatment. Patient verbalized understanding. Patient denies further questions at this time and I encouraged her to call me should she have any questions prior to tomorrow's appointment and that I will see her during her Cycle 1 treatment appointment tomorrow. All patient's questions answered to her satisfaction, patient knows to call for questions/concerns. Adele Dan, RN, Clinical Research 01/16/2015 4:02 PM

## 2015-01-17 ENCOUNTER — Encounter: Payer: Self-pay | Admitting: Medical Oncology

## 2015-01-17 ENCOUNTER — Ambulatory Visit (HOSPITAL_BASED_OUTPATIENT_CLINIC_OR_DEPARTMENT_OTHER): Payer: Medicaid - Out of State

## 2015-01-17 ENCOUNTER — Other Ambulatory Visit: Payer: Self-pay | Admitting: Internal Medicine

## 2015-01-17 DIAGNOSIS — C3411 Malignant neoplasm of upper lobe, right bronchus or lung: Secondary | ICD-10-CM | POA: Diagnosis not present

## 2015-01-17 DIAGNOSIS — C3412 Malignant neoplasm of upper lobe, left bronchus or lung: Secondary | ICD-10-CM

## 2015-01-17 DIAGNOSIS — Z5111 Encounter for antineoplastic chemotherapy: Secondary | ICD-10-CM

## 2015-01-17 DIAGNOSIS — C3490 Malignant neoplasm of unspecified part of unspecified bronchus or lung: Secondary | ICD-10-CM

## 2015-01-17 DIAGNOSIS — Z006 Encounter for examination for normal comparison and control in clinical research program: Secondary | ICD-10-CM | POA: Diagnosis not present

## 2015-01-17 MED ORDER — SODIUM CHLORIDE 0.9 % IJ SOLN
10.0000 mL | INTRAMUSCULAR | Status: DC | PRN
  Administered 2015-01-17: 10 mL
  Filled 2015-01-17: qty 10

## 2015-01-17 MED ORDER — HEPARIN SOD (PORK) LOCK FLUSH 100 UNIT/ML IV SOLN
500.0000 [IU] | Freq: Once | INTRAVENOUS | Status: AC | PRN
  Administered 2015-01-17: 500 [IU]
  Filled 2015-01-17: qty 5

## 2015-01-17 MED ORDER — SODIUM CHLORIDE 0.9 % IV SOLN
Freq: Once | INTRAVENOUS | Status: AC
  Administered 2015-01-17: 13:00:00 via INTRAVENOUS
  Filled 2015-01-17: qty 4

## 2015-01-17 MED ORDER — SODIUM CHLORIDE 0.9 % IV SOLN
Freq: Once | INTRAVENOUS | Status: AC
  Administered 2015-01-17: 13:00:00 via INTRAVENOUS

## 2015-01-17 MED ORDER — SODIUM CHLORIDE 0.9 % IV SOLN
1000.0000 mg | Freq: Once | INTRAVENOUS | Status: AC
  Administered 2015-01-17: 1000 mg via INTRAVENOUS
  Filled 2015-01-17: qty 40

## 2015-01-17 NOTE — Progress Notes (Signed)
BMS 370 Group A: B-1 Cycle 1 Met patient today in treatment room to start Cycle 1 of Alimta on study. Treatment infusion sign given to [employer]. Patient was provided with on-study Patient ID card with all required information completed on card and patient was educated on how and when to use card. Patient gave verbal understanding. Informed patient that treatments will be scheduled every 3 weeks with labs and office visit prior to treatment. Patient with no changes since yesterday's office visit. All her questions answered to her satisfaction and I thanked patient for her willingness to participate in study and encouraged her to call Dr. Julien Nordmann or myself with any questions or concerns that she may have. Patient with Cycle 2 appointments July 12th. Adele Dan, RN, Clinical Research 01/17/2015 3:26 PM

## 2015-01-17 NOTE — Progress Notes (Signed)
Per Rubin Payor, RN okay to treat pt based on labs from 01/09/15.

## 2015-01-17 NOTE — Patient Instructions (Signed)
[facility] Discharge Instructions for Patients Receiving Chemotherapy  Today you received the following chemotherapy agents; Alimta.   To help prevent nausea and vomiting after your treatment, we encourage you to take your nausea medication as directed.    If you develop nausea and vomiting that is not controlled by your nausea medication, call the clinic.   BELOW ARE SYMPTOMS THAT SHOULD BE REPORTED IMMEDIATELY:  *FEVER GREATER THAN 100.5 F  *CHILLS WITH OR WITHOUT FEVER  NAUSEA AND VOMITING THAT IS NOT CONTROLLED WITH YOUR NAUSEA MEDICATION  *UNUSUAL SHORTNESS OF BREATH  *UNUSUAL BRUISING OR BLEEDING  TENDERNESS IN MOUTH AND THROAT WITH OR WITHOUT PRESENCE OF ULCERS  *URINARY PROBLEMS  *BOWEL PROBLEMS  UNUSUAL RASH Items with * indicate a potential emergency and should be followed up as soon as possible.  Feel free to call the clinic you have any questions or concerns. The clinic phone number is [phone].  Please show the CHEMO ALERT CARD at check-in to the Emergency Department and triage nurse.   

## 2015-01-20 NOTE — Patient Instructions (Signed)
Continue labs, chemotherapy, and follow-up per protocol

## 2015-01-21 ENCOUNTER — Encounter: Payer: Self-pay | Admitting: Physician Assistant

## 2015-01-21 DIAGNOSIS — Z5111 Encounter for antineoplastic chemotherapy: Secondary | ICD-10-CM

## 2015-01-21 HISTORY — DX: Encounter for antineoplastic chemotherapy: Z51.11

## 2015-01-22 NOTE — Telephone Encounter (Signed)
Phone busy 01/11/15

## 2015-01-24 ENCOUNTER — Other Ambulatory Visit: Payer: Self-pay | Admitting: Internal Medicine

## 2015-02-05 ENCOUNTER — Other Ambulatory Visit: Payer: Self-pay | Admitting: Medical Oncology

## 2015-02-05 ENCOUNTER — Encounter: Payer: Self-pay | Admitting: Adult Health

## 2015-02-05 ENCOUNTER — Ambulatory Visit (INDEPENDENT_AMBULATORY_CARE_PROVIDER_SITE_OTHER): Payer: Medicaid - Out of State | Admitting: Adult Health

## 2015-02-05 VITALS — BP 132/88 | HR 93 | Temp 98.5°F | Ht 64.0 in | Wt 207.0 lb

## 2015-02-05 DIAGNOSIS — Z720 Tobacco use: Secondary | ICD-10-CM

## 2015-02-05 DIAGNOSIS — C3490 Malignant neoplasm of unspecified part of unspecified bronchus or lung: Secondary | ICD-10-CM

## 2015-02-05 DIAGNOSIS — J449 Chronic obstructive pulmonary disease, unspecified: Secondary | ICD-10-CM | POA: Diagnosis not present

## 2015-02-05 DIAGNOSIS — F17200 Nicotine dependence, unspecified, uncomplicated: Secondary | ICD-10-CM

## 2015-02-05 MED ORDER — TIOTROPIUM BROMIDE MONOHYDRATE 18 MCG IN CAPS: 18.0000 ug | ORAL_CAPSULE | Freq: Every day | RESPIRATORY_TRACT | Status: DC

## 2015-02-05 NOTE — Patient Instructions (Addendum)
Continue on BREO  Add Spiriva daily .  Work on not smoking .  Follow up Dr. Elsworth Soho  In 4 months and As needed

## 2015-02-05 NOTE — Assessment & Plan Note (Signed)
Not optimally controlled in smoker   Plan  Continue on BREO  Add Spiriva daily .  Work on not smoking .  Follow up Dr. Elsworth Soho  In 4 months and As needed

## 2015-02-05 NOTE — Assessment & Plan Note (Signed)
Smoking cessation discussed 

## 2015-02-05 NOTE — Progress Notes (Signed)
   Subjective:    Patient ID: Heather Banks, female    DOB: 1950/11/28, 64 y.o.   MRN: [id number]  HPI  64 yo smoker with COPD and adenocarcinoma lung, stage IV.  Admitted 06/1314 for COPD exacerbation.  CT showed bilateral upper lobe lung mass w/ minmally enlarged hilar and mediastinal nodes, hypermetabolic on PET She was discharged on oxygen at 3 L. PFT showed an FEV1 at 46%, ratio of 59. Diffusing capacity was decreased at 56% , FVC was 60%  Underwent ENB guided biopsy , T1 (LUL ) neg, T2 & T3 (RUL) showed adenoCA.    10/05/14 Follow up  On chemo - carboplatin + alimta cycle #3 - tolerated first 2 well -minimla hair loss C/o costipation Headaches worse - hasnot seen PCP -wants refill on vicodin Pulmicort has fallen off list - Breo worked,needs Rx Reports smoking is decreased to 1-2cigs per days  02/05/2015 Follow up : COPD and Lung Cancer  Patient returns for a four-month follow-up. States overall she is doing okay. Does feel her breathing gets worse in evening , like her inhaler wears off.  She remains on BREO .  No flare of cough or wheezing. No emergency room or hospitalizations since last visit. Denies chest pain, orthopnea, edema or fever.   Patient has stage IV non-small cell lung cancer  Treated with systemic chemotherapy with carboplatin and Alimta status post 6 cycles She has recently been enrolled in a clinical trial with Alimta versus nivolumab versus Alimta + nivolumab. She has been randomized to the Alimta arm. Most recent CT chest showed a decrease in the right upper lobe nodule, no change in the left upper lobe mass. And stable right middle lobe nodular pattern. There was no evidence of disease progression. Says she is tolerating treatment .     Review of Systems neg for any significant sore throat, dysphagia, itching, sneezing, nasal congestion or excess/ purulent secretions, fever, chills, sweats, unintended wt loss, pleuritic or exertional cp, hempoptysis,  orthopnea pnd or change in chronic leg swelling. Also denies presyncope, palpitations, heartburn, abdominal pain, nausea, vomiting, diarrhea or change in bowel or urinary habits, dysuria,hematuria, rash, arthralgias, visual complaints, headache, numbness weakness or ataxia.     Objective:   Physical Exam  Gen. Pleasant, well-nourished, in no distress ENT - no lesions, no post nasal drip, edentulous  Neck: No JVD, no thyromegaly, no carotid bruits Lungs: no use of accessory muscles, no dullness to percussion, decreased BS in bases , w/ no wheezing or rales.  Cardiovascular: Rhythm regular, heart sounds  normal, no murmurs or gallops, tr  peripheral edema Musculoskeletal: No deformities, no cyanosis or clubbing        Assessment & Plan:

## 2015-02-05 NOTE — Assessment & Plan Note (Signed)
Most recent CT w/ no disease progression  Cont w/ oncology follow up

## 2015-02-06 ENCOUNTER — Telehealth: Payer: Self-pay | Admitting: *Deleted

## 2015-02-06 ENCOUNTER — Other Ambulatory Visit (HOSPITAL_COMMUNITY)
Admission: RE | Admit: 2015-02-06 | Discharge: 2015-02-06 | Disposition: A | Discharge status: Home / Self Care | Payer: Medicaid - Out of State | Source: Ambulatory Visit | Attending: Internal Medicine | Admitting: Internal Medicine

## 2015-02-06 ENCOUNTER — Encounter: Payer: Self-pay | Admitting: Internal Medicine

## 2015-02-06 ENCOUNTER — Ambulatory Visit (HOSPITAL_BASED_OUTPATIENT_CLINIC_OR_DEPARTMENT_OTHER): Payer: Medicaid - Out of State | Admitting: Physician Assistant

## 2015-02-06 ENCOUNTER — Telehealth: Payer: Self-pay | Admitting: Physician Assistant

## 2015-02-06 ENCOUNTER — Encounter: Payer: Self-pay | Admitting: Physician Assistant

## 2015-02-06 ENCOUNTER — Encounter: Payer: Self-pay | Admitting: Medical Oncology

## 2015-02-06 ENCOUNTER — Other Ambulatory Visit (HOSPITAL_BASED_OUTPATIENT_CLINIC_OR_DEPARTMENT_OTHER): Payer: Medicaid - Out of State

## 2015-02-06 ENCOUNTER — Ambulatory Visit (HOSPITAL_BASED_OUTPATIENT_CLINIC_OR_DEPARTMENT_OTHER): Payer: Medicaid - Out of State

## 2015-02-06 VITALS — BP 154/77 | HR 116 | Temp 98.6°F | Resp 18 | Ht 64.0 in | Wt 207.6 lb

## 2015-02-06 DIAGNOSIS — C3411 Malignant neoplasm of upper lobe, right bronchus or lung: Secondary | ICD-10-CM

## 2015-02-06 DIAGNOSIS — Z006 Encounter for examination for normal comparison and control in clinical research program: Secondary | ICD-10-CM

## 2015-02-06 DIAGNOSIS — C3490 Malignant neoplasm of unspecified part of unspecified bronchus or lung: Secondary | ICD-10-CM

## 2015-02-06 DIAGNOSIS — G43009 Migraine without aura, not intractable, without status migrainosus: Secondary | ICD-10-CM

## 2015-02-06 DIAGNOSIS — F329 Major depressive disorder, single episode, unspecified: Secondary | ICD-10-CM | POA: Diagnosis not present

## 2015-02-06 DIAGNOSIS — C3412 Malignant neoplasm of upper lobe, left bronchus or lung: Secondary | ICD-10-CM

## 2015-02-06 DIAGNOSIS — Z720 Tobacco use: Secondary | ICD-10-CM | POA: Diagnosis not present

## 2015-02-06 DIAGNOSIS — Z5111 Encounter for antineoplastic chemotherapy: Secondary | ICD-10-CM

## 2015-02-06 LAB — CBC WITH DIFFERENTIAL/PLATELET
BASO%: 0.9 % (ref 0.0–2.0)
Basophils Absolute: 0.1 10*3/uL (ref 0.0–0.1)
EOS%: 0 % (ref 0.0–7.0)
Eosinophils Absolute: 0 10*3/uL (ref 0.0–0.5)
HEMATOCRIT: 38.6 % (ref 34.8–46.6)
HGB: 12.9 g/dL (ref 11.6–15.9)
LYMPH#: 1.3 10*3/uL (ref 0.9–3.3)
LYMPH%: 11.5 % — AB (ref 14.0–49.7)
MCH: 32.1 pg (ref 25.1–34.0)
MCHC: 33.4 g/dL (ref 31.5–36.0)
MCV: 96 fL (ref 79.5–101.0)
MONO#: 1.1 10*3/uL — ABNORMAL HIGH (ref 0.1–0.9)
MONO%: 9.9 % (ref 0.0–14.0)
NEUT%: 77.7 % — AB (ref 38.4–76.8)
NEUTROS ABS: 9 10*3/uL — AB (ref 1.5–6.5)
Platelets: 391 10*3/uL (ref 145–400)
RBC: 4.02 10*6/uL (ref 3.70–5.45)
RDW: 15 % — ABNORMAL HIGH (ref 11.2–14.5)
WBC: 11.6 10*3/uL — ABNORMAL HIGH (ref 3.9–10.3)

## 2015-02-06 LAB — COMPREHENSIVE METABOLIC PANEL (CC13)
ALT: 32 U/L (ref 0–55)
AST: 25 U/L (ref 5–34)
Albumin: 3.7 g/dL (ref 3.5–5.0)
Alkaline Phosphatase: 126 U/L (ref 40–150)
Anion Gap: 12 mEq/L — ABNORMAL HIGH (ref 3–11)
BUN: 8.3 mg/dL (ref 7.0–26.0)
CO2: 22 mEq/L (ref 22–29)
Calcium: 10.2 mg/dL (ref 8.4–10.4)
Chloride: 106 mEq/L (ref 98–109)
Creatinine: 0.7 mg/dL (ref 0.6–1.1)
EGFR: >90 mL/min/{1.73_m2} (ref >90)
GLUCOSE: 138 mg/dL (ref 70–140)
POTASSIUM: 3.6 meq/L (ref 3.5–5.1)
Sodium: 140 mEq/L (ref 136–145)
TOTAL PROTEIN: 7.5 g/dL (ref 6.4–8.3)
Total Bilirubin: <0.2 mg/dL (ref 0.20–1.20)

## 2015-02-06 LAB — LACTATE DEHYDROGENASE (CC13): LDH: 240 U/L (ref 125–245)

## 2015-02-06 LAB — MAGNESIUM (CC13): MAGNESIUM: 2 mg/dL (ref 1.5–2.5)

## 2015-02-06 LAB — PHOSPHORUS: PHOSPHORUS: 2.8 mg/dL (ref 2.5–4.6)

## 2015-02-06 MED ORDER — SODIUM CHLORIDE 0.9 % IJ SOLN
10.0000 mL | INTRAMUSCULAR | Status: DC | PRN
  Administered 2015-02-06: 10 mL
  Filled 2015-02-06: qty 10

## 2015-02-06 MED ORDER — PEMETREXED DISODIUM CHEMO INJECTION 500 MG
1000.0000 mg | Freq: Once | INTRAVENOUS | Status: AC
  Administered 2015-02-06: 1000 mg via INTRAVENOUS
  Filled 2015-02-06: qty 40

## 2015-02-06 MED ORDER — DEXAMETHASONE SODIUM PHOSPHATE 100 MG/10ML IJ SOLN
Freq: Once | INTRAMUSCULAR | Status: AC
  Administered 2015-02-06: 11:00:00 via INTRAVENOUS
  Filled 2015-02-06: qty 4

## 2015-02-06 MED ORDER — SODIUM CHLORIDE 0.9 % IV SOLN
Freq: Once | INTRAVENOUS | Status: AC
  Administered 2015-02-06: 10:00:00 via INTRAVENOUS

## 2015-02-06 MED ORDER — HEPARIN SOD (PORK) LOCK FLUSH 100 UNIT/ML IV SOLN
500.0000 [IU] | Freq: Once | INTRAVENOUS | Status: AC | PRN
  Administered 2015-02-06: 500 [IU]
  Filled 2015-02-06: qty 5

## 2015-02-06 NOTE — Patient Instructions (Signed)
Follow-up in 3 weeks

## 2015-02-06 NOTE — Progress Notes (Signed)
Reviewed & agree with plan  

## 2015-02-06 NOTE — Progress Notes (Signed)
BMS [id number] - questionnaires (PROs) - patient into the cancer center for routine visit. Patient was given PROs upon arrival to the cancer center. The patient completed her PROs (EQ-5D-3L first and then the FACT-L) before any study procedures were performed. I checked the PROs for completeness.  Patient's research blood was drawn 01/16/2015.  The patient was thanked for her continued support of this clinical trial. Barb Joeanne Robicheaux 02/06/2015 9:01 AM

## 2015-02-06 NOTE — Telephone Encounter (Signed)
Per staff message and POF I have scheduled appts. Advised scheduler of appts. JMW  

## 2015-02-06 NOTE — Progress Notes (Signed)
BMS 370 Group A, Arm B-1: Cycle 2/week 3 Met with patient today during office visit this morning. PRO's completed prior to patient's lab appointment today by research assistant [facility], Resting vital signs with O2 completed. Patient's heart rate elevated today to 116, patient c/o headache this morning with pain rating of 10/10, states headache is "working on getting a migraine." Patient states she is experiencing stress due to news of a friend suffering a heart attack over the weekend and she had difficulty sleeping last night. Patient continues with SOB with exertion, also BLE edema +1 pitting present to ankles. Patient denies: bowel/bladder concerns, bleeding, mouth sores, chest pains, skin problems, neuro concerns, eye problems and reports no new symptoms. Concomitant medication reviewed and patient reports to have a new prescription for Spiriva, which she has not filled yet, and has started taking an OTC probiotic approximately a week or so ago. Dr. Julien Nordmann reviewed patient's labs and met with patient today, non clinically significant abnormal labs reviewed as well and per MD, patient ok to proceed with Cycle 2 today. Patient encouraged to contact clinic with any questions and concerns and worsening of headache or lack of resolution to it and any new symptoms. Patient gave verbal understanding. Patient thanked for her time and continued support of study.  Return appointment for Cycle 3 scheduled 08/02. Adele Dan, RN, Clinical Research 02/06/2015 10:40 AM

## 2015-02-06 NOTE — Patient Instructions (Signed)
[facility] Discharge Instructions for Patients Receiving Chemotherapy  Today you received the following chemotherapy agents; Alimta.   To help prevent nausea and vomiting after your treatment, we encourage you to take your nausea medication as directed.    If you develop nausea and vomiting that is not controlled by your nausea medication, call the clinic.   BELOW ARE SYMPTOMS THAT SHOULD BE REPORTED IMMEDIATELY:  *FEVER GREATER THAN 100.5 F  *CHILLS WITH OR WITHOUT FEVER  NAUSEA AND VOMITING THAT IS NOT CONTROLLED WITH YOUR NAUSEA MEDICATION  *UNUSUAL SHORTNESS OF BREATH  *UNUSUAL BRUISING OR BLEEDING  TENDERNESS IN MOUTH AND THROAT WITH OR WITHOUT PRESENCE OF ULCERS  *URINARY PROBLEMS  *BOWEL PROBLEMS  UNUSUAL RASH Items with * indicate a potential emergency and should be followed up as soon as possible.  Feel free to call the clinic you have any questions or concerns. The clinic phone number is [phone].  Please show the CHEMO ALERT CARD at check-in to the Emergency Department and triage nurse.   

## 2015-02-06 NOTE — Telephone Encounter (Signed)
Pt confirmed labs/ov per 07/12 POF, gave pt AVS and Calendar.Cherylann Banas, sent msg to add chemo

## 2015-02-06 NOTE — Progress Notes (Addendum)
[place] [phone]   [phone]  OFFICE PROGRESS NOTE  No PCP Per Patient No address on file  DIAGNOSIS: stage IV (T2a, N0, M1a) non-small cell lung cancer, adenocarcinoma presented with bilateral pulmonary lesions. This could be also consider as synchronous primary tumors.  PRIOR THERAPY: Systemic chemotherapy with carboplatin for AUC of 5 and Alimta 500 MG/M2 every 3 weeks. Status post 6 cycles  CURRENT THERAPY: Participation and the BMS checkmate 370 clinical trial. Randomized to group a arm be 1 treatment with pemetrexed (Alimta) 500 mg/m given every 3 weeks. Status post 1 cycle  INTERVAL HISTORY: Heather Banks 64 y.o. female returns to the clinic today for follow-up visit. She presents to proceed with treatment according to the BMS Checkmate 370 clinical trial and has been randomized to pemetrexed every 3 weeks. She is status post 1 cycle. She is tolerating her single agent Alimta without difficulty. She reports having a migraine headache today. She is under a lot of social stresses, having a  close friend who recently had a heart attack. She usually takes Excedrin Migraine for headaches with reasonable relief.  She denied having any significant weight loss or night sweats. She is eating well.    She denied having any significant nausea or vomiting.  She has no fever or chills. The patient denied having any significant chest pain, shortness breath, cough or hemoptysis. She continues to note some lower extremity swelling.   MEDICAL HISTORY: Past Medical History  Diagnosis Date  . COPD (chronic obstructive pulmonary disease)     Albuterol neb as needed;Pulmicort neb daily  . GERD (gastroesophageal reflux disease)     takes Pantoprazole and Zantac daily  . Lung mass   . Pneumonia     hx of-last time about 4+yrs ago  . History of bronchitis     >5 yrs ago  . History of migraine     last one 2 wks ago  . Panic attacks     but doesn't take  any meds  . Encounter for antineoplastic chemotherapy 01/21/2015    ALLERGIES:  has No Known Allergies.  MEDICATIONS:  Current Outpatient Prescriptions  Medication Sig Dispense Refill  . albuterol (PROVENTIL) (2.5 MG/3ML) 0.083% nebulizer solution Take 3 mLs (2.5 mg total) by nebulization every 6 (six) hours as needed for wheezing or shortness of breath. 75 mL 12  . ALPRAZolam (XANAX) 0.5 MG tablet TAKE 1 TABLET BY MOUTH EVERY NIGHT AT BEDTIME AS NEEDED FOR ANXIETY 30 tablet 0  . aspirin-acetaminophen-caffeine (EXCEDRIN MIGRAINE) 357-017-79 MG per tablet Take 2 tablets by mouth every 6 (six) hours as needed for headache.    . dexamethasone (DECADRON) 4 MG tablet 4 mg by mouth twice a day the day before, day of and day after the chemotherapy every 3 weeks 40 tablet 1  . Fluticasone Furoate-Vilanterol 100-25 MCG/INH AEPB Inhale 1 puff into the lungs daily. 1 each 5  . folic acid (FOLVITE) 1 MG tablet TAKE 1 TABLET BY MOUTH EVERY DAY 30 tablet 0  . lidocaine-prilocaine (EMLA) cream Apply 1 application topically as needed. (Patient taking differently: Apply 1 application topically as needed (For port-a-cath.). ) 30 g 1  . mirtazapine (REMERON) 30 MG tablet TAKE 1 TABLET BY MOUTH EVERY NIGHT AT BEDTIME 30 tablet 0  . Multiple Vitamins-Minerals (MULTIVITAMIN WITH MINERALS) tablet Take 1 tablet by mouth daily.    . nicotine (NICODERM CQ) 21 mg/24hr patch Place 1 patch (21 mg total) onto the skin  daily. 28 patch 0  . omeprazole (PRILOSEC) 20 MG capsule TAKE 1 CAPSULE BY MOUTH TWICE DAILY BEFORE A MEAL 60 capsule 0  . PRESCRIPTION MEDICATION She receives her treatments at the [facility]. Her oncologist is Dr. Julien Nordmann. She received Alimta '925mg'$  and Carboplatin '560mg'$  on 08/30/14.    . Probiotic Product (PROBIOTIC DAILY PO) Take by mouth.    . prochlorperazine (COMPAZINE) 10 MG tablet Take 1 tablet (10 mg total) by mouth every 6 (six) hours as needed for nausea or vomiting. 60 tablet 0   . tiotropium (SPIRIVA HANDIHALER) 18 MCG inhalation capsule Place 1 capsule (18 mcg total) into inhaler and inhale daily. 30 capsule 5  . HYDROcodone-acetaminophen (NORCO/VICODIN) 5-325 MG per tablet Take 1 tablet by mouth every 6 (six) hours as needed for moderate pain or severe pain. (Patient not taking: Reported on 02/05/2015) 30 tablet 0  . polyethylene glycol (MIRALAX / GLYCOLAX) packet Take 17 g by mouth daily. Takes 2 caps every night     No current facility-administered medications for this visit.    SURGICAL HISTORY:  Past Surgical History  Procedure Laterality Date  . Abdominal hysterectomy    . Oophorectomy    . Appendectomy    . Tonsillectomy    . Video bronchoscopy with endobronchial navigation N/A 08/14/2014    Procedure: VIDEO BRONCHOSCOPY WITH ENDOBRONCHIAL NAVIGATION;  Surgeon: Rigoberto Noel, MD;  Location: [place] [facility];  Service: Thoracic;  Laterality: N/A;  . Portacath placement  jan. 2016    REVIEW OF SYSTEMS:  A comprehensive review of systems was negative except for: Constitutional: positive for fatigue Gastrointestinal: positive for reflux symptoms   PHYSICAL EXAMINATION: General appearance: alert, cooperative and no distress Head: Normocephalic, without obvious abnormality, atraumatic Neck: no adenopathy, no JVD, supple, symmetrical, trachea midline and thyroid not enlarged, symmetric, no tenderness/mass/nodules Lymph nodes: Cervical, supraclavicular, and axillary nodes normal. Resp: clear to auscultation bilaterally Back: symmetric, no curvature. ROM normal. No CVA tenderness. Cardio: regular rate and rhythm, S1, S2 normal, no murmur, click, rub or gallop GI: soft, non-tender; bowel sounds normal; no masses,  no organomegaly Extremities: extremities normal, atraumatic, no cyanosis or edema and edema trace to 1+ soft tissue edema  ECOG PERFORMANCE STATUS: 1 - Symptomatic but completely ambulatory  Blood pressure 154/77, pulse 116, temperature 98.6 F (37 C),  temperature source Oral, resp. rate 18, height '5\' 4"'$  (1.626 m), weight 207 lb 9.6 oz (94.167 kg), SpO2 96 %.  LABORATORY DATA: Lab Results  Component Value Date   WBC 11.6* 02/06/2015   HGB 12.9 02/06/2015   HCT 38.6 02/06/2015   MCV 96.0 02/06/2015   PLT 391 02/06/2015      Chemistry      Component Value Date/Time   NA 141 01/09/2015 1017   NA 138 08/11/2014 0837   K 3.8 01/09/2015 1017   K 3.8 08/11/2014 0837   CL 103 08/11/2014 0837   CO2 23 01/09/2015 1017   CO2 23 08/11/2014 0837   BUN 7.3 01/09/2015 1017   BUN 8 08/11/2014 0837   CREATININE 0.7 01/09/2015 1017   CREATININE 0.57 08/11/2014 0837      Component Value Date/Time   CALCIUM 9.0 01/09/2015 1017   CALCIUM 9.0 08/11/2014 0837   ALKPHOS 109 01/09/2015 1017   ALKPHOS 145* 07/10/2014 0459   AST 26 01/09/2015 1017   AST 11 07/10/2014 0459   ALT 30 01/09/2015 1017   ALT 8 07/10/2014 0459   BILITOT 0.26 01/09/2015 1017  BILITOT <0.2* 07/10/2014 0459       RADIOGRAPHIC STUDIES: No results found.  ASSESSMENT AND PLAN: This is a very pleasant 64 years old white female with stage IV non-small cell lung cancer currently undergoing systemic chemotherapy with carboplatin and Alimta status post 6 cycles. The patient tolerated the last cycle of her treatment fairly well. Her recent restaging CT scan revealed further interval decrease in the size of her right upper lobe lesion with no new or progressive disease identified in the chest abdomen or pelvis.  Patient was discussed with and also seen by Dr. Julien Nordmann. As her disease is improved/stable she was accepted and enrolled in the BMS checked in 02/27/1969 clinical trial with Alimta versus nivolumab versus Alimta + nivolumab. She has been randomized to the Alimta arm. She is status post 1 cycle. She will proceed for cycle 2 today as scheduled. She'll continue her Excedrin Migraine for treatment of her migraine headaches. She will be due for her next restaging CT scan on  03/21/2015. She'll follow-up as per protocol. For depression, she will continue on Remeron 30 mg by mouth daily at bedtime. For the acid reflux, she will continue on Prilosec 20 mg by mouth twice a day.  She was advised to call immediately if she has any concerning symptoms in the interval. The patient voices understanding of current disease status and treatment options and is in agreement with the current care plan.  All questions were answered. The patient knows to call the clinic with any problems, questions or concerns. We can certainly see the patient much sooner if necessary.  Carlton Adam, PA-C 02/06/2015    ADDENDUM: Hematology/Oncology Attending: I had a face to face encounter with the patient. I recommended her care plan. This is a very pleasant 64 years old white female with metastatic non-small cell lung cancer, adenocarcinoma status post induction chemotherapy was carboplatin and Alimta and she is currently on the BMS checkmate 370 clinical trial and was randomized to maintenance treatment with single agent Alimta every 3 weeks. The patient status post one cycle. She is tolerating her treatment well with no specific complaints. She is currently under a lot of stress after her close friend had heart attack.  I recommended for the patient to proceed with cycle #2 today as a scheduled. For depression she will continue on Remeron. She would come back for follow-up visit in 3 weeks for reevaluation before starting cycle #3. The patient was advised to call immediately if she has any concerning symptoms in the interval.  Disclaimer: This note was dictated with voice recognition software. Similar sounding words can inadvertently be transcribed and may be missed upon review.  [name]., MD 02/08/2015

## 2015-02-26 ENCOUNTER — Other Ambulatory Visit: Payer: Self-pay | Admitting: Medical Oncology

## 2015-02-26 ENCOUNTER — Other Ambulatory Visit (HOSPITAL_COMMUNITY)
Admission: RE | Admit: 2015-02-26 | Discharge: 2015-02-26 | Disposition: A | Discharge status: Home / Self Care | Payer: Medicaid - Out of State | Source: Ambulatory Visit | Attending: Internal Medicine | Admitting: Internal Medicine

## 2015-02-26 DIAGNOSIS — C3490 Malignant neoplasm of unspecified part of unspecified bronchus or lung: Secondary | ICD-10-CM

## 2015-02-27 ENCOUNTER — Other Ambulatory Visit (HOSPITAL_BASED_OUTPATIENT_CLINIC_OR_DEPARTMENT_OTHER): Payer: Self-pay

## 2015-02-27 ENCOUNTER — Ambulatory Visit (HOSPITAL_BASED_OUTPATIENT_CLINIC_OR_DEPARTMENT_OTHER): Payer: Self-pay

## 2015-02-27 ENCOUNTER — Ambulatory Visit: Payer: Medicaid - Out of State

## 2015-02-27 ENCOUNTER — Encounter: Payer: Self-pay | Admitting: *Deleted

## 2015-02-27 ENCOUNTER — Encounter: Payer: Self-pay | Admitting: Physician Assistant

## 2015-02-27 ENCOUNTER — Other Ambulatory Visit (HOSPITAL_COMMUNITY)
Admission: RE | Admit: 2015-02-27 | Discharge: 2015-02-27 | Disposition: A | Discharge status: Home / Self Care | Payer: Self-pay | Source: Ambulatory Visit | Attending: Internal Medicine | Admitting: Internal Medicine

## 2015-02-27 ENCOUNTER — Encounter: Payer: Self-pay | Admitting: Medical Oncology

## 2015-02-27 ENCOUNTER — Telehealth: Payer: Self-pay | Admitting: Internal Medicine

## 2015-02-27 ENCOUNTER — Ambulatory Visit (HOSPITAL_BASED_OUTPATIENT_CLINIC_OR_DEPARTMENT_OTHER): Payer: Self-pay | Admitting: Physician Assistant

## 2015-02-27 VITALS — BP 141/87 | HR 111 | Temp 98.4°F | Resp 21 | Ht 64.0 in | Wt 210.0 lb

## 2015-02-27 DIAGNOSIS — Z006 Encounter for examination for normal comparison and control in clinical research program: Secondary | ICD-10-CM

## 2015-02-27 DIAGNOSIS — C3490 Malignant neoplasm of unspecified part of unspecified bronchus or lung: Secondary | ICD-10-CM

## 2015-02-27 DIAGNOSIS — C3411 Malignant neoplasm of upper lobe, right bronchus or lung: Secondary | ICD-10-CM

## 2015-02-27 DIAGNOSIS — Z720 Tobacco use: Secondary | ICD-10-CM | POA: Insufficient documentation

## 2015-02-27 DIAGNOSIS — C3412 Malignant neoplasm of upper lobe, left bronchus or lung: Secondary | ICD-10-CM

## 2015-02-27 DIAGNOSIS — Z5111 Encounter for antineoplastic chemotherapy: Secondary | ICD-10-CM

## 2015-02-27 LAB — COMPREHENSIVE METABOLIC PANEL (CC13)
ALBUMIN: 3.8 g/dL (ref 3.5–5.0)
ALT: 29 U/L (ref 0–55)
ANION GAP: 12 meq/L — AB (ref 3–11)
AST: 22 U/L (ref 5–34)
Alkaline Phosphatase: 134 U/L (ref 40–150)
BILIRUBIN TOTAL: 0.26 mg/dL (ref 0.20–1.20)
BUN: 9 mg/dL (ref 7.0–26.0)
CALCIUM: 9.9 mg/dL (ref 8.4–10.4)
CHLORIDE: 104 meq/L (ref 98–109)
CO2: 23 mEq/L (ref 22–29)
Creatinine: 0.8 mg/dL (ref 0.6–1.1)
EGFR: 85 mL/min/{1.73_m2} — ABNORMAL LOW (ref >90)
Glucose: 152 mg/dl — ABNORMAL HIGH (ref 70–140)
Potassium: 3.9 mEq/L (ref 3.5–5.1)
SODIUM: 139 meq/L (ref 136–145)
Total Protein: 8 g/dL (ref 6.4–8.3)

## 2015-02-27 LAB — CBC WITH DIFFERENTIAL/PLATELET
BASO%: 0.5 % (ref 0.0–2.0)
BASOS ABS: 0.1 10*3/uL (ref 0.0–0.1)
EOS ABS: 0 10*3/uL (ref 0.0–0.5)
EOS%: 0 % (ref 0.0–7.0)
HCT: 39.3 % (ref 34.8–46.6)
HGB: 13.3 g/dL (ref 11.6–15.9)
LYMPH#: 1.6 10*3/uL (ref 0.9–3.3)
LYMPH%: 15.7 % (ref 14.0–49.7)
MCH: 32.1 pg (ref 25.1–34.0)
MCHC: 33.7 g/dL (ref 31.5–36.0)
MCV: 95.3 fL (ref 79.5–101.0)
MONO#: 0.7 10*3/uL (ref 0.1–0.9)
MONO%: 7.3 % (ref 0.0–14.0)
NEUT%: 76.5 % (ref 38.4–76.8)
NEUTROS ABS: 7.8 10*3/uL — AB (ref 1.5–6.5)
PLATELETS: 417 10*3/uL — AB (ref 145–400)
RBC: 4.12 10*6/uL (ref 3.70–5.45)
RDW: 15.2 % — AB (ref 11.2–14.5)
WBC: 10.2 10*3/uL (ref 3.9–10.3)

## 2015-02-27 LAB — RESEARCH LABS

## 2015-02-27 LAB — TSH CHCC: TSH: 0.448 m[IU]/L (ref 0.308–3.960)

## 2015-02-27 LAB — PHOSPHORUS: PHOSPHORUS: 3.7 mg/dL (ref 2.5–4.6)

## 2015-02-27 LAB — MAGNESIUM (CC13): Magnesium: 2 mg/dl (ref 1.5–2.5)

## 2015-02-27 LAB — LACTATE DEHYDROGENASE (CC13): LDH: 234 U/L (ref 125–245)

## 2015-02-27 MED ORDER — SODIUM CHLORIDE 0.9 % IJ SOLN
10.0000 mL | INTRAMUSCULAR | Status: DC | PRN
  Administered 2015-02-27: 10 mL
  Filled 2015-02-27: qty 10

## 2015-02-27 MED ORDER — SODIUM CHLORIDE 0.9 % IV SOLN
1000.0000 mg | Freq: Once | INTRAVENOUS | Status: AC
  Administered 2015-02-27: 1000 mg via INTRAVENOUS
  Filled 2015-02-27: qty 40

## 2015-02-27 MED ORDER — MIRTAZAPINE 30 MG PO TABS
30.0000 mg | ORAL_TABLET | Freq: Every day | ORAL | Status: DC
Start: 2015-02-27 — End: 2015-03-30

## 2015-02-27 MED ORDER — HEPARIN SOD (PORK) LOCK FLUSH 100 UNIT/ML IV SOLN
500.0000 [IU] | Freq: Once | INTRAVENOUS | Status: AC | PRN
  Administered 2015-02-27: 500 [IU]
  Filled 2015-02-27: qty 5

## 2015-02-27 MED ORDER — OMEPRAZOLE 20 MG PO CPDR: DELAYED_RELEASE_CAPSULE | ORAL | Status: DC

## 2015-02-27 MED ORDER — SODIUM CHLORIDE 0.9 % IV SOLN
Freq: Once | INTRAVENOUS | Status: AC
  Administered 2015-02-27: 11:00:00 via INTRAVENOUS

## 2015-02-27 MED ORDER — FOLIC ACID 1 MG PO TABS: 1.0000 mg | ORAL_TABLET | Freq: Every day | ORAL | Status: DC

## 2015-02-27 MED ORDER — SODIUM CHLORIDE 0.9 % IV SOLN
Freq: Once | INTRAVENOUS | Status: AC
  Administered 2015-02-27: 11:00:00 via INTRAVENOUS
  Filled 2015-02-27: qty 4

## 2015-02-27 NOTE — Progress Notes (Signed)
BMS 370: Group A, Arm B1: Cycle 3/ week 6 Patient here today for cycle 3/week 6 labs/office visit/treatment, shared visit with PA Awilda Metro and Dr. Julien Nordmann. Met with patient after patient had PRO's completed with research assistant, Marthann Schiller, and lab appointment. Patient's resting vitals completed and patient with elevated heart rate at 111 bpm, in which the patient has a history of tachycardia. Patient maintaining weight. Concomitant medications reviewed with patient and she denies any new medications or changes. Patient reports that she has noticed with her last two treatments having skin sensitivity to the chest area, she states it makes it difficult to wear a bra or clothes that fit too snugly. Patient reports she thought nothing of it with the first cycle so she did not report it and thought she should report it this time since she experienced it again with Cycle 2. Patient reports the skin sensitivity starts the next day following Alimta treatment and last for approximately 4-5 days, denies rash or change of skin color and is occurs only to the front chest area, bilateral breast area up to bilateral collarbone area. Per Dr. Julien Nordmann, for this cycle, patient to take children's benadryl and to not take decadron today or tomorrow to see if there is improvement. Patient expressed understanding. Patient does report increase in fatigue that starts approximately a week after treatment and lasts approx for 2 weeks after, but does not interfere with her ADL's, she has noticed though that she is more "sleepy." Patient denies vomiting, denies bowel or bladder issues, denies pain, denies rash, denies eye/visual concerns, denies neurological concerns. Patient does continue to report occassional nausea, continued SOB with exertion and has mild edema present to bilateral feet and ankles, more to left LE than right. Inquired with patient if she continues to smoke, patient confirms that she is still smoking but states  she smokes only "when stressed or nervous, and a pack of cigarettes lasts me for a month or more." Patient encouraged to quit. Infusion treatment sign provided to [facility] RN, denies any questions. Patient to return for Week 9 scan on 08/18 and Cycle 4 08/24. All patient's questions answered to her satisfaction and patient strongly encouraged to call Dr. Julien Nordmann or myself with any questions or concerns. Adele Dan, RN, BSN Clinical Research 02/27/2015 11:46 AM

## 2015-02-27 NOTE — Telephone Encounter (Signed)
lvm for pt regarding to Aug appt... °

## 2015-02-27 NOTE — Progress Notes (Addendum)
[place] [phone]   [phone]  OFFICE PROGRESS NOTE  No PCP Per Patient No address on file  DIAGNOSIS: stage IV (T2a, N0, M1a) non-small cell lung cancer, adenocarcinoma presented with bilateral pulmonary lesions. This could be also consider as synchronous primary tumors.  PRIOR THERAPY: Systemic chemotherapy with carboplatin for AUC of 5 and Alimta 500 MG/M2 every 3 weeks. Status post 6 cycles  CURRENT THERAPY: Participation and the BMS checkmate 370 clinical trial. Randomized to group a arm be 1 treatment with pemetrexed (Alimta) 500 mg/m given every 3 weeks. Status post 2 cycles  INTERVAL HISTORY: Heather Banks 64 y.o. female returns to the clinic today for follow-up visit. She presents to proceed with treatment according to the BMS Checkmate 370 clinical trial and has been randomized to pemetrexed every 3 weeks. She is status post 2 cycles. She is tolerating her single agent Alimta for the most part with the exception of some skin sensitivity and discomfort starting day or 2 after chemotherapy and lasting for approximately 3 days. She states that this time that she cannot stand for anything to touch her skin and wears a 2 XL so that material is not directly against her skin. She denied having any significant weight loss or night sweats. She is eating well. She denied having any significant nausea or vomiting.  She has no fever or chills. The patient denied having any significant chest pain, shortness breath, cough or hemoptysis.   MEDICAL HISTORY: Past Medical History  Diagnosis Date  . COPD (chronic obstructive pulmonary disease)     Albuterol neb as needed;Pulmicort neb daily  . GERD (gastroesophageal reflux disease)     takes Pantoprazole and Zantac daily  . Lung mass   . Pneumonia     hx of-last time about 4+yrs ago  . History of bronchitis     >5 yrs ago  . History of migraine     last one 2 wks ago  . Panic attacks     but  doesn't take any meds  . Encounter for antineoplastic chemotherapy 01/21/2015    ALLERGIES:  has No Known Allergies.  MEDICATIONS:  Current Outpatient Prescriptions  Medication Sig Dispense Refill  . albuterol (PROVENTIL) (2.5 MG/3ML) 0.083% nebulizer solution Take 3 mLs (2.5 mg total) by nebulization every 6 (six) hours as needed for wheezing or shortness of breath. 75 mL 12  . ALPRAZolam (XANAX) 0.5 MG tablet TAKE 1 TABLET BY MOUTH EVERY NIGHT AT BEDTIME AS NEEDED FOR ANXIETY 30 tablet 0  . aspirin-acetaminophen-caffeine (EXCEDRIN MIGRAINE) 703-500-93 MG per tablet Take 2 tablets by mouth every 6 (six) hours as needed for headache.    . dexamethasone (DECADRON) 4 MG tablet 4 mg by mouth twice a day the day before, day of and day after the chemotherapy every 3 weeks 40 tablet 1  . Fluticasone Furoate-Vilanterol 100-25 MCG/INH AEPB Inhale 1 puff into the lungs daily. 1 each 5  . folic acid (FOLVITE) 1 MG tablet Take 1 tablet (1 mg total) by mouth daily. 30 tablet 2  . HYDROcodone-acetaminophen (NORCO/VICODIN) 5-325 MG per tablet Take 1 tablet by mouth every 6 (six) hours as needed for moderate pain or severe pain. (Patient not taking: Reported on 02/05/2015) 30 tablet 0  . lidocaine-prilocaine (EMLA) cream Apply 1 application topically as needed. (Patient taking differently: Apply 1 application topically as needed (For port-a-cath.). ) 30 g 1  . mirtazapine (REMERON) 30 MG tablet Take 1 tablet (30 mg  total) by mouth at bedtime. 30 tablet 0  . Multiple Vitamins-Minerals (MULTIVITAMIN WITH MINERALS) tablet Take 1 tablet by mouth daily.    . nicotine (NICODERM CQ) 21 mg/24hr patch Place 1 patch (21 mg total) onto the skin daily. 28 patch 0  . omeprazole (PRILOSEC) 20 MG capsule TAKE 1 CAPSULE BY MOUTH TWICE DAILY BEFORE A MEAL 60 capsule 0  . polyethylene glycol (MIRALAX / GLYCOLAX) packet Take 17 g by mouth daily. Takes 2 caps every night    . PRESCRIPTION MEDICATION She receives her treatments at  the [facility]. Her oncologist is Dr. Julien Nordmann. She received Alimta '925mg'$  and Carboplatin '560mg'$  on 08/30/14.    . Probiotic Product (PROBIOTIC DAILY PO) Take by mouth.    . prochlorperazine (COMPAZINE) 10 MG tablet Take 1 tablet (10 mg total) by mouth every 6 (six) hours as needed for nausea or vomiting. 60 tablet 0  . tiotropium (SPIRIVA HANDIHALER) 18 MCG inhalation capsule Place 1 capsule (18 mcg total) into inhaler and inhale daily. 30 capsule 5   No current facility-administered medications for this visit.   Facility-Administered Medications Ordered in Other Visits  Medication Dose Route Frequency Provider Last Rate Last Dose  . sodium chloride 0.9 % injection 10 mL  10 mL Intracatheter PRN Curt Bears, MD   10 mL at 02/27/15 1150    SURGICAL HISTORY:  Past Surgical History  Procedure Laterality Date  . Abdominal hysterectomy    . Oophorectomy    . Appendectomy    . Tonsillectomy    . Video bronchoscopy with endobronchial navigation N/A 08/14/2014    Procedure: VIDEO BRONCHOSCOPY WITH ENDOBRONCHIAL NAVIGATION;  Surgeon: Rigoberto Noel, MD;  Location: [place];  Service: Thoracic;  Laterality: N/A;  . Portacath placement  jan. 2016    REVIEW OF SYSTEMS:  A comprehensive review of systems was negative except for: Constitutional: positive for fatigue Gastrointestinal: positive for reflux symptoms Integument/breast: positive for Skin sensitivity without rash starting the day after chemotherapy,  lasting for 3-4 days then spontaneously resolves   PHYSICAL EXAMINATION: General appearance: alert, cooperative and no distress Head: Normocephalic, without obvious abnormality, atraumatic Neck: no adenopathy, no JVD, supple, symmetrical, trachea midline and thyroid not enlarged, symmetric, no tenderness/mass/nodules Lymph nodes: Cervical, supraclavicular, and axillary nodes normal. Resp: clear to auscultation bilaterally Back: symmetric, no curvature. ROM normal. No CVA  tenderness. Cardio: regular rate and rhythm, S1, S2 normal, no murmur, click, rub or gallop GI: soft, non-tender; bowel sounds normal; no masses,  no organomegaly Extremities: extremities normal, atraumatic, no cyanosis or edema and edema trace to 1+ soft tissue edema Skin: No skin lesions, rash or skin eruptions  ECOG PERFORMANCE STATUS: 1 - Symptomatic but completely ambulatory  Blood pressure 141/87, pulse 111, temperature 98.4 F (36.9 C), temperature source Oral, resp. rate 21, height '5\' 4"'$  (1.626 m), weight 210 lb (95.255 kg), SpO2 96 %.  LABORATORY DATA: Lab Results  Component Value Date   WBC 10.2 02/27/2015   HGB 13.3 02/27/2015   HCT 39.3 02/27/2015   MCV 95.3 02/27/2015   PLT 417* 02/27/2015      Chemistry      Component Value Date/Time   NA 139 02/27/2015 0934   NA 138 08/11/2014 0837   K 3.9 02/27/2015 0934   K 3.8 08/11/2014 0837   CL 103 08/11/2014 0837   CO2 23 02/27/2015 0934   CO2 23 08/11/2014 0837   BUN 9.0 02/27/2015 0934   BUN 8 08/11/2014 0837  CREATININE 0.8 02/27/2015 0934   CREATININE 0.57 08/11/2014 0837      Component Value Date/Time   CALCIUM 9.9 02/27/2015 0934   CALCIUM 9.0 08/11/2014 0837   ALKPHOS 134 02/27/2015 0934   ALKPHOS 145* 07/10/2014 0459   AST 22 02/27/2015 0934   AST 11 07/10/2014 0459   ALT 29 02/27/2015 0934   ALT 8 07/10/2014 0459   BILITOT 0.26 02/27/2015 0934   BILITOT <0.2* 07/10/2014 0459       RADIOGRAPHIC STUDIES: No results found.  ASSESSMENT AND PLAN: This is a very pleasant 64 years old white female with stage IV non-small cell lung cancer currently undergoing systemic chemotherapy with carboplatin and Alimta status post 6 cycles. The patient tolerated the last cycle of her treatment fairly well. Her recent restaging CT scan revealed further interval decrease in the size of her right upper lobe lesion with no new or progressive disease identified in the chest abdomen or pelvis.  Patient was discussed with  and also seen by Dr. Julien Nordmann. As her disease is improved/stable she was accepted and enrolled in the BMS checked in 02/27/1969 clinical trial with Alimta versus nivolumab versus Alimta + nivolumab. She has been randomized to the Alimta arm. She is status post 2 cycles. She will proceed for cycle 3 today as scheduled. She was advised to discontinue the dexamethasone as perhaps this might be causing her symptoms. She may take Benadryl to help with her skin sensitivity symptoms. We will reschedule her restaging CT scan to occur either 03/15/2015 R 03/16/2015 so that the results are available prior to her follow-up in 3 weeks, prior to the start of cycle #4 She'll follow-up as per protocol. For depression, she will continue on Remeron 30 mg by mouth daily at bedtime. For the acid reflux, she will continue on Prilosec 20 mg by mouth twice a day. Refill prescriptions for her folic acid, Remeron and omeprazole were sent to her pharmacy of record via E scribe She was advised to call immediately if she has any concerning symptoms in the interval. The patient voices understanding of current disease status and treatment options and is in agreement with the current care plan.  All questions were answered. The patient knows to call the clinic with any problems, questions or concerns. We can certainly see the patient much sooner if necessary.  Carlton Adam, PA-C 02/27/2015    ADDENDUM: Hematology/Oncology Attending: I had a face to face encounter with the patient. I recommended her care plan. This is a very pleasant 64 years old white female with a stage IV non-small cell lung cancer status post induction systemic chemotherapy was carboplatin and Alimta and currently undergoing treatment with maintenance single agent Alimta 500 MG/M2 every 3 weeks status post 2 cycles according to the BMS checked minutes 370 clinical trial. The patient is tolerating her treatment fairly well except for occasional skin  soreness after the chemotherapy mainly on the chest area. I recommended for her to continue her treatment with Alimta as a scheduled. She was advised to use Benadryl for the skin sensitivity after her treatment. She will come back for follow-up visit in 3 weeks for evaluation after repeating CT scan of the chest, abdomen and pelvis for restaging of her disease. The patient was advised to call immediately if she has any concerning symptoms in the interval.  Disclaimer: This note was dictated with voice recognition software. Similar sounding words can inadvertently be transcribed and may be missed upon review. [name]., MD 02/27/2015

## 2015-02-27 NOTE — Patient Instructions (Signed)
Stop taking the dexamethasone as recommended. He may take Benadryl as needed for symptomatic relief Follow-up in 3 weeks per protocol with a restaging CT scan of your chest, abdomen and pelvis to reevaluate your disease.

## 2015-02-27 NOTE — Patient Instructions (Signed)
[facility] Discharge Instructions for Patients Receiving Chemotherapy  Today you received the following chemotherapy agents alimta  To help prevent nausea and vomiting after your treatment, we encourage you to take your nausea medication as directed  If you develop nausea and vomiting that is not controlled by your nausea medication, call the clinic.   BELOW ARE SYMPTOMS THAT SHOULD BE REPORTED IMMEDIATELY:  *FEVER GREATER THAN 100.5 F  *CHILLS WITH OR WITHOUT FEVER  NAUSEA AND VOMITING THAT IS NOT CONTROLLED WITH YOUR NAUSEA MEDICATION  *UNUSUAL SHORTNESS OF BREATH  *UNUSUAL BRUISING OR BLEEDING  TENDERNESS IN MOUTH AND THROAT WITH OR WITHOUT PRESENCE OF ULCERS  *URINARY PROBLEMS  *BOWEL PROBLEMS  UNUSUAL RASH Items with * indicate a potential emergency and should be followed up as soon as possible.  Feel free to call the clinic you have any questions or concerns. The clinic phone number is [phone].  

## 2015-02-27 NOTE — Progress Notes (Signed)
BMS [id number] - questionnaires (PROs) - patient into the cancer center for routine visit. Patient was given PROs upon arrival to the cancer center. The patient completed her PROs (EQ-5D-3L first and then FACT-L) before any study procedures were performed. I checked the PROs for completeness.  Research blood was drawn. The patient was thanked for her continued support of this clinical trial.

## 2015-03-01 ENCOUNTER — Encounter: Payer: Self-pay | Admitting: Medical Oncology

## 2015-03-01 DIAGNOSIS — C3490 Malignant neoplasm of unspecified part of unspecified bronchus or lung: Secondary | ICD-10-CM

## 2015-03-01 NOTE — Progress Notes (Signed)
BMS 370: Group A, Arm B1 Follow up call to patient to inquire how her last treatment went and if she had any continued skin sensitivity the day after following treatment. Patient states she had some slight "soreness, but nothing like before, it's tolerable." Patient reports to be out shopping, at the time of our conversation, states she continues with fatigue but is trying to get some exercise in. Patient also reports that she had not taken any benadryl as Dr. Julien Nordmann suggested, but she did stop the decadron as he instructed the day of her treatment. Patient with no other complaints or questions. I encouraged her to contact clinic with any concerns or questions that she may have prior to her next appointment. Adele Dan, RN, BSN Clinical Research 03/01/2015 3:20 PM

## 2015-03-12 ENCOUNTER — Telehealth: Payer: Self-pay | Admitting: Medical Oncology

## 2015-03-12 NOTE — Telephone Encounter (Signed)
LVMOM with patient inquiring as to whether she had received the required contrast to drink for scheduled CT on the 08/18. Asked patient to please call me back at her earliest convenience if she has not or with any questions/concerns.  Adele Dan, RN, BSN Clinical Research 03/12/2015 1:16 PM

## 2015-03-14 ENCOUNTER — Telehealth: Payer: Self-pay | Admitting: Medical Oncology

## 2015-03-14 NOTE — Telephone Encounter (Signed)
Confirmed with patient her CT appointment tomorrow and that she does have the required contrast to drink for procedure. Patient reports to be doing well, no new symptoms, states she is still fatigued and is concerned about tomorrow's scan results. Encouraged patient to stay positive and offered support as well as encouraged her to call me with any concerns or questions she has.

## 2015-03-15 ENCOUNTER — Encounter (HOSPITAL_COMMUNITY): Payer: Self-pay

## 2015-03-15 ENCOUNTER — Ambulatory Visit (HOSPITAL_COMMUNITY)
Admission: RE | Admit: 2015-03-15 | Discharge: 2015-03-15 | Disposition: A | Discharge status: Home / Self Care | Payer: Self-pay | Source: Ambulatory Visit | Attending: Physician Assistant | Admitting: Physician Assistant

## 2015-03-15 DIAGNOSIS — K760 Fatty (change of) liver, not elsewhere classified: Secondary | ICD-10-CM | POA: Insufficient documentation

## 2015-03-15 DIAGNOSIS — J984 Other disorders of lung: Secondary | ICD-10-CM | POA: Insufficient documentation

## 2015-03-15 DIAGNOSIS — I700 Atherosclerosis of aorta: Secondary | ICD-10-CM | POA: Insufficient documentation

## 2015-03-15 DIAGNOSIS — R590 Localized enlarged lymph nodes: Secondary | ICD-10-CM | POA: Insufficient documentation

## 2015-03-15 DIAGNOSIS — Z08 Encounter for follow-up examination after completed treatment for malignant neoplasm: Secondary | ICD-10-CM | POA: Insufficient documentation

## 2015-03-15 DIAGNOSIS — C3490 Malignant neoplasm of unspecified part of unspecified bronchus or lung: Secondary | ICD-10-CM | POA: Insufficient documentation

## 2015-03-15 MED ORDER — IOHEXOL 300 MG/ML  SOLN
100.0000 mL | Freq: Once | INTRAMUSCULAR | Status: AC | PRN
Start: 2015-03-15 — End: 2015-03-15
  Administered 2015-03-15: 100 mL via INTRAVENOUS

## 2015-03-19 ENCOUNTER — Other Ambulatory Visit: Payer: Self-pay | Admitting: Medical Oncology

## 2015-03-19 DIAGNOSIS — C3490 Malignant neoplasm of unspecified part of unspecified bronchus or lung: Secondary | ICD-10-CM

## 2015-03-20 ENCOUNTER — Other Ambulatory Visit (HOSPITAL_BASED_OUTPATIENT_CLINIC_OR_DEPARTMENT_OTHER): Payer: Medicaid - Out of State

## 2015-03-20 ENCOUNTER — Ambulatory Visit (HOSPITAL_BASED_OUTPATIENT_CLINIC_OR_DEPARTMENT_OTHER): Payer: Medicaid - Out of State

## 2015-03-20 ENCOUNTER — Encounter: Payer: Self-pay | Admitting: Internal Medicine

## 2015-03-20 ENCOUNTER — Ambulatory Visit (HOSPITAL_BASED_OUTPATIENT_CLINIC_OR_DEPARTMENT_OTHER): Payer: Medicaid - Out of State | Admitting: Internal Medicine

## 2015-03-20 ENCOUNTER — Other Ambulatory Visit: Payer: Self-pay | Admitting: Medical Oncology

## 2015-03-20 ENCOUNTER — Telehealth: Payer: Self-pay | Admitting: Internal Medicine

## 2015-03-20 ENCOUNTER — Ambulatory Visit: Payer: Medicaid - Out of State

## 2015-03-20 ENCOUNTER — Other Ambulatory Visit (HOSPITAL_COMMUNITY)
Admission: RE | Admit: 2015-03-20 | Discharge: 2015-03-20 | Disposition: A | Discharge status: Home / Self Care | Payer: Medicaid - Out of State | Source: Ambulatory Visit | Attending: Internal Medicine | Admitting: Internal Medicine

## 2015-03-20 ENCOUNTER — Ambulatory Visit: Payer: Self-pay

## 2015-03-20 ENCOUNTER — Encounter: Payer: Self-pay | Admitting: Medical Oncology

## 2015-03-20 VITALS — BP 137/84 | HR 95 | Temp 97.9°F | Resp 18 | Ht 64.0 in | Wt 209.4 lb

## 2015-03-20 DIAGNOSIS — Z5111 Encounter for antineoplastic chemotherapy: Secondary | ICD-10-CM | POA: Diagnosis not present

## 2015-03-20 DIAGNOSIS — F32A Depression, unspecified: Secondary | ICD-10-CM

## 2015-03-20 DIAGNOSIS — C3412 Malignant neoplasm of upper lobe, left bronchus or lung: Secondary | ICD-10-CM | POA: Diagnosis not present

## 2015-03-20 DIAGNOSIS — C3490 Malignant neoplasm of unspecified part of unspecified bronchus or lung: Secondary | ICD-10-CM

## 2015-03-20 DIAGNOSIS — C3411 Malignant neoplasm of upper lobe, right bronchus or lung: Secondary | ICD-10-CM

## 2015-03-20 DIAGNOSIS — Z006 Encounter for examination for normal comparison and control in clinical research program: Secondary | ICD-10-CM | POA: Diagnosis not present

## 2015-03-20 DIAGNOSIS — Z720 Tobacco use: Secondary | ICD-10-CM | POA: Insufficient documentation

## 2015-03-20 DIAGNOSIS — Z95828 Presence of other vascular implants and grafts: Secondary | ICD-10-CM

## 2015-03-20 DIAGNOSIS — F329 Major depressive disorder, single episode, unspecified: Secondary | ICD-10-CM

## 2015-03-20 LAB — CBC WITH DIFFERENTIAL/PLATELET
BASO%: 0.7 % (ref 0.0–2.0)
Basophils Absolute: 0.1 10*3/uL (ref 0.0–0.1)
EOS%: 0.1 % (ref 0.0–7.0)
Eosinophils Absolute: 0 10*3/uL (ref 0.0–0.5)
HEMATOCRIT: 38.8 % (ref 34.8–46.6)
HEMOGLOBIN: 12.7 g/dL (ref 11.6–15.9)
LYMPH#: 1 10*3/uL (ref 0.9–3.3)
LYMPH%: 10.4 % — ABNORMAL LOW (ref 14.0–49.7)
MCH: 31 pg (ref 25.1–34.0)
MCHC: 32.7 g/dL (ref 31.5–36.0)
MCV: 94.9 fL (ref 79.5–101.0)
MONO#: 0.3 10*3/uL (ref 0.1–0.9)
MONO%: 2.9 % (ref 0.0–14.0)
NEUT%: 85.9 % — ABNORMAL HIGH (ref 38.4–76.8)
NEUTROS ABS: 8 10*3/uL — AB (ref 1.5–6.5)
Platelets: 414 10*3/uL — ABNORMAL HIGH (ref 145–400)
RBC: 4.08 10*6/uL (ref 3.70–5.45)
RDW: 16 % — AB (ref 11.2–14.5)
WBC: 9.3 10*3/uL (ref 3.9–10.3)

## 2015-03-20 LAB — COMPREHENSIVE METABOLIC PANEL (CC13)
ALBUMIN: 3.7 g/dL (ref 3.5–5.0)
ALK PHOS: 119 U/L (ref 40–150)
ALT: 26 U/L (ref 0–55)
AST: 26 U/L (ref 5–34)
Anion Gap: 15 mEq/L — ABNORMAL HIGH (ref 3–11)
BUN: 8.7 mg/dL (ref 7.0–26.0)
CALCIUM: 9.6 mg/dL (ref 8.4–10.4)
CO2: 19 mEq/L — ABNORMAL LOW (ref 22–29)
CREATININE: 0.8 mg/dL (ref 0.6–1.1)
Chloride: 108 mEq/L (ref 98–109)
EGFR: 85 mL/min/{1.73_m2} — ABNORMAL LOW (ref >90)
GLUCOSE: 163 mg/dL — AB (ref 70–140)
Potassium: 3.7 mEq/L (ref 3.5–5.1)
SODIUM: 142 meq/L (ref 136–145)
TOTAL PROTEIN: 7.5 g/dL (ref 6.4–8.3)

## 2015-03-20 LAB — LACTATE DEHYDROGENASE (CC13): LDH: 259 U/L — ABNORMAL HIGH (ref 125–245)

## 2015-03-20 LAB — PHOSPHORUS: Phosphorus: 2.6 mg/dL (ref 2.5–4.6)

## 2015-03-20 LAB — MAGNESIUM (CC13): MAGNESIUM: 2.1 mg/dL (ref 1.5–2.5)

## 2015-03-20 MED ORDER — HEPARIN SOD (PORK) LOCK FLUSH 100 UNIT/ML IV SOLN
500.0000 [IU] | Freq: Once | INTRAVENOUS | Status: AC | PRN
  Administered 2015-03-20: 500 [IU]
  Filled 2015-03-20: qty 5

## 2015-03-20 MED ORDER — SODIUM CHLORIDE 0.9 % IV SOLN
Freq: Once | INTRAVENOUS | Status: AC
  Administered 2015-03-20: 12:00:00 via INTRAVENOUS
  Filled 2015-03-20: qty 4

## 2015-03-20 MED ORDER — SODIUM CHLORIDE 0.9 % IJ SOLN
10.0000 mL | INTRAMUSCULAR | Status: DC | PRN
  Administered 2015-03-20: 10 mL via INTRAVENOUS
  Filled 2015-03-20: qty 10

## 2015-03-20 MED ORDER — CYANOCOBALAMIN 1000 MCG/ML IJ SOLN
1000.0000 ug | Freq: Once | INTRAMUSCULAR | Status: AC
  Administered 2015-03-20: 1000 ug via INTRAMUSCULAR

## 2015-03-20 MED ORDER — ALPRAZOLAM 0.5 MG PO TABS: 0.5000 mg | ORAL_TABLET | Freq: Every evening | ORAL | Status: DC | PRN

## 2015-03-20 MED ORDER — SODIUM CHLORIDE 0.9 % IV SOLN
1000.0000 mg | Freq: Once | INTRAVENOUS | Status: AC
  Administered 2015-03-20: 1000 mg via INTRAVENOUS
  Filled 2015-03-20: qty 40

## 2015-03-20 MED ORDER — CYANOCOBALAMIN 1000 MCG/ML IJ SOLN
INTRAMUSCULAR | Status: AC
  Filled 2015-03-20: qty 1

## 2015-03-20 MED ORDER — SODIUM CHLORIDE 0.9 % IV SOLN
Freq: Once | INTRAVENOUS | Status: AC
  Administered 2015-03-20: 11:00:00 via INTRAVENOUS

## 2015-03-20 MED ORDER — SODIUM CHLORIDE 0.9 % IJ SOLN
10.0000 mL | INTRAMUSCULAR | Status: DC | PRN
  Administered 2015-03-20: 10 mL
  Filled 2015-03-20: qty 10

## 2015-03-20 NOTE — Telephone Encounter (Signed)
Gave adn printed appt sched and avs for pt for Sept °

## 2015-03-20 NOTE — Progress Notes (Signed)
BMS 370: Group A, Arm B1: Cycle 4/Week 9 Patient here for cycle 4 treatment today. PRO's completed with Marthann Schiller, prior to patient's lab appointment. I met with patient and patient's fiance this morning in exam room. Resting V/S obtained, patient maintaining weight, O2 saturation @ 99%. Patient reports no new symptoms and denies new medications. She does report to have decreased her Decadron to 1 tablet day before chemo treatment, the day of and the day after, per MD ok.  Patient denies pain, vomiting, change in bowel or bladder habits, no rash or skin problems. Patient continues to report SOB with exertion, her baseline "queasiness," usual fatigue after treatment and continues with BLE edema to ankles with right ankle greater than left. Dr. Julien Nordmann reviewed patient's recent CT scan and informed patient of slight increase to target lesions but overall patient with stable disease based on RECIST and per protocol to continue with current treatment plan, patient gave verbal understanding and agreement. Labs, as well as NCS labs, and A/E's reviewed by MD and patient cleared for Cycle 4 today. All patient 's questions answered to her satisfaction. Patient was encouraged to call Dr. Julien Nordmann or myself with any questions or concerns. Cycle 5 scheduled for 09/13. Infusion sign provided to Mayra Reel RN. Adele Dan, RN, BSN Clinical Research 03/20/2015 11:45 AM

## 2015-03-20 NOTE — Progress Notes (Signed)
[facility] [phone]   [phone]  OFFICE PROGRESS NOTE  No PCP Per Patient No address on file  DIAGNOSIS: Stage IV (T2a, N0, M1a) non-small cell lung cancer, adenocarcinoma presented with bilateral pulmonary lesions. This could be also consider as synchronous primary tumors.  PRIOR THERAPY: Systemic chemotherapy with carboplatin for AUC of 5 and Alimta 500 MG/M2 every 3 weeks. Status post 6 cycles  CURRENT THERAPY: Participation and the BMS checkmate 370 clinical trial. Randomized to group a arm be 1 treatment with pemetrexed (Alimta) 500 mg/m given every 3 weeks. Status post 3 cycles  INTERVAL HISTORY: Heather Banks 64 y.o. female returns to the clinic today for follow-up visit accompanied by her friend. The patient is tolerating her treatment fairly well with no significant adverse effects. She denied having any significant weight loss or night sweats. She denied having any significant nausea or vomiting. She has no fever or chills. The patient denied having any significant chest pain, shortness breath, cough or hemoptysis. She had repeat CT scan of the chest, abdomen and pelvis performed recently and she is here for evaluation and discussion of her scan results.  MEDICAL HISTORY: Past Medical History  Diagnosis Date  . COPD (chronic obstructive pulmonary disease)     Albuterol neb as needed;Pulmicort neb daily  . GERD (gastroesophageal reflux disease)     takes Pantoprazole and Zantac daily  . Lung mass   . Pneumonia     hx of-last time about 4+yrs ago  . History of bronchitis     >5 yrs ago  . History of migraine     last one 2 wks ago  . Panic attacks     but doesn't take any meds  . Encounter for antineoplastic chemotherapy 01/21/2015    ALLERGIES:  has No Known Allergies.  MEDICATIONS:  Current Outpatient Prescriptions  Medication Sig Dispense Refill  . albuterol (PROVENTIL) (2.5 MG/3ML) 0.083% nebulizer solution Take 3 mLs  (2.5 mg total) by nebulization every 6 (six) hours as needed for wheezing or shortness of breath. 75 mL 12  . ALPRAZolam (XANAX) 0.5 MG tablet TAKE 1 TABLET BY MOUTH EVERY NIGHT AT BEDTIME AS NEEDED FOR ANXIETY 30 tablet 0  . aspirin-acetaminophen-caffeine (EXCEDRIN MIGRAINE) 245-809-98 MG per tablet Take 2 tablets by mouth every 6 (six) hours as needed for headache.    . dexamethasone (DECADRON) 4 MG tablet 4 mg by mouth twice a day the day before, day of and day after the chemotherapy every 3 weeks 40 tablet 1  . Fluticasone Furoate-Vilanterol 100-25 MCG/INH AEPB Inhale 1 puff into the lungs daily. 1 each 5  . folic acid (FOLVITE) 1 MG tablet Take 1 tablet (1 mg total) by mouth daily. 30 tablet 2  . HYDROcodone-acetaminophen (NORCO/VICODIN) 5-325 MG per tablet Take 1 tablet by mouth every 6 (six) hours as needed for moderate pain or severe pain. (Patient not taking: Reported on 02/05/2015) 30 tablet 0  . lidocaine-prilocaine (EMLA) cream Apply 1 application topically as needed. (Patient taking differently: Apply 1 application topically as needed (For port-a-cath.). ) 30 g 1  . mirtazapine (REMERON) 30 MG tablet Take 1 tablet (30 mg total) by mouth at bedtime. 30 tablet 0  . Multiple Vitamins-Minerals (MULTIVITAMIN WITH MINERALS) tablet Take 1 tablet by mouth daily.    . nicotine (NICODERM CQ) 21 mg/24hr patch Place 1 patch (21 mg total) onto the skin daily. 28 patch 0  . omeprazole (PRILOSEC) 20 MG capsule TAKE 1  CAPSULE BY MOUTH TWICE DAILY BEFORE A MEAL 60 capsule 0  . polyethylene glycol (MIRALAX / GLYCOLAX) packet Take 17 g by mouth daily. Takes 2 caps every night    . PRESCRIPTION MEDICATION She receives her treatments at the [facility]. Her oncologist is Dr. Julien Nordmann. She received Alimta '925mg'$  and Carboplatin '560mg'$  on 08/30/14.    . Probiotic Product (PROBIOTIC DAILY PO) Take by mouth.    . prochlorperazine (COMPAZINE) 10 MG tablet Take 1 tablet (10 mg total) by mouth every  6 (six) hours as needed for nausea or vomiting. 60 tablet 0  . tiotropium (SPIRIVA HANDIHALER) 18 MCG inhalation capsule Place 1 capsule (18 mcg total) into inhaler and inhale daily. 30 capsule 5   No current facility-administered medications for this visit.    SURGICAL HISTORY:  Past Surgical History  Procedure Laterality Date  . Abdominal hysterectomy    . Oophorectomy    . Appendectomy    . Tonsillectomy    . Video bronchoscopy with endobronchial navigation N/A 08/14/2014    Procedure: VIDEO BRONCHOSCOPY WITH ENDOBRONCHIAL NAVIGATION;  Surgeon: Rigoberto Noel, MD;  Location: [facility];  Service: Thoracic;  Laterality: N/A;  . Portacath placement  jan. 2016    REVIEW OF SYSTEMS:  Constitutional: negative Eyes: negative Ears, nose, mouth, throat, and face: negative Respiratory: negative Cardiovascular: negative Gastrointestinal: negative Genitourinary:negative Integument/breast: negative Hematologic/lymphatic: negative Musculoskeletal:negative Neurological: negative Behavioral/Psych: negative Endocrine: negative Allergic/Immunologic: negative   PHYSICAL EXAMINATION: General appearance: alert, cooperative and no distress Head: Normocephalic, without obvious abnormality, atraumatic Neck: no adenopathy, no JVD, supple, symmetrical, trachea midline and thyroid not enlarged, symmetric, no tenderness/mass/nodules Lymph nodes: Cervical, supraclavicular, and axillary nodes normal. Resp: clear to auscultation bilaterally Back: symmetric, no curvature. ROM normal. No CVA tenderness. Cardio: regular rate and rhythm, S1, S2 normal, no murmur, click, rub or gallop GI: soft, non-tender; bowel sounds normal; no masses,  no organomegaly Extremities: extremities normal, atraumatic, no cyanosis or edema Neurologic: Alert and oriented X 3, normal strength and tone. Normal symmetric reflexes. Normal coordination and gait  ECOG PERFORMANCE STATUS: 1 - Symptomatic but completely ambulatory  Blood  pressure 137/84, pulse 95, temperature 97.9 F (36.6 C), temperature source Oral, resp. rate 18, height '5\' 4"'$  (1.626 m), weight 209 lb 6.4 oz (94.983 kg), SpO2 99 %.  LABORATORY DATA: Lab Results  Component Value Date   WBC 9.3 03/20/2015   HGB 12.7 03/20/2015   HCT 38.8 03/20/2015   MCV 94.9 03/20/2015   PLT 414* 03/20/2015      Chemistry      Component Value Date/Time   NA 139 02/27/2015 0934   NA 138 08/11/2014 0837   K 3.9 02/27/2015 0934   K 3.8 08/11/2014 0837   CL 103 08/11/2014 0837   CO2 23 02/27/2015 0934   CO2 23 08/11/2014 0837   BUN 9.0 02/27/2015 0934   BUN 8 08/11/2014 0837   CREATININE 0.8 02/27/2015 0934   CREATININE 0.57 08/11/2014 0837      Component Value Date/Time   CALCIUM 9.9 02/27/2015 0934   CALCIUM 9.0 08/11/2014 0837   ALKPHOS 134 02/27/2015 0934   ALKPHOS 145* 07/10/2014 0459   AST 22 02/27/2015 0934   AST 11 07/10/2014 0459   ALT 29 02/27/2015 0934   ALT 8 07/10/2014 0459   BILITOT 0.26 02/27/2015 0934   BILITOT <0.2* 07/10/2014 0459       RADIOGRAPHIC STUDIES: Ct Chest W Contrast  03/15/2015   CLINICAL DATA:  Restaging adenocarcinoma  of the left lung. Initial diagnosis January 2016. Ongoing chemotherapy.  Recist version 1.1  EXAM: CT CHEST, ABDOMEN, AND PELVIS WITH CONTRAST  TECHNIQUE: Multidetector CT imaging of the chest, abdomen and pelvis was performed following the standard protocol during bolus administration of intravenous contrast.  CONTRAST:  147m OMNIPAQUE IOHEXOL 300 MG/ML  SOLN  COMPARISON:  12/28/2014 chest CT and 10/30/2014 abdomen CT scan. PET-CT 07/31/2014  FINDINGS: Recist 1.1  Target lesions:  1. 4.5 x 4.4 cm left upper lobe lung mass on image number 13. This previously measured 4.0 x 3.5 cm. Please note, the previous report stated right upper lobe but it is the left upper lobe. 2. 16 x 13 mm right upper lobe lesion on image 18. This previously measured 15 x 12.5 mm. Non target lesions:  1. None.  CT CHEST FINDINGS  Chest  wall: No chest wall mass, supraclavicular or axillary lymphadenopathy. A right-sided Port-A-Cath is noted. The bony thorax is intact. No destructive bone lesions or spinal canal compromise.  Mediastinum: The heart is normal in size. No pericardial effusion. No enlarged mediastinal or hilar lymph nodes. A prevascular node on image number 23 is stable. A fatty pretracheal node is also stable. The esophagus is unremarkable and unchanged. Stable coronary artery calcifications.  Lungs/ pleura: Enlarging left upper lobe pulmonary mass now measuring 4.5 x 4.4 cm. The right upper lobe pulmonary lesion is also slightly larger measuring 16 x 13 mm. Patchy ill-defined the area of scarring type change in the right upper lobe appears stable. Stable 3 mm left lower lobe pulmonary nodule on image number 31. No new pulmonary nodules, pleural effusion or acute pulmonary findings. Stable emphysematous changes in the upper lobes bilaterally.  CT ABDOMEN AND PELVIS FINDINGS  Hepatobiliary: Diffuse fatty infiltration of the liver but no focal hepatic lesions to suggest metastatic disease. The gallbladder is mildly distended. No common bile duct dilatation.  Pancreas: No mass, inflammation or ductal dilatation.  Spleen: Normal size.  No focal lesions.  Adrenals/Urinary Tract: The adrenal glands and kidneys are unremarkable and stable. Small renal cysts are noted.  Stomach/Bowel: The stomach, duodenum, small bowel and colon are unremarkable. No inflammatory changes, mass lesions or obstructive findings. The terminal ileum is normal.  Vascular/Lymphatic: No mesenteric or retroperitoneal mass or adenopathy. Stable advanced atherosclerotic calcifications involving the aorta and branch vessels.  Other: No pelvic mass or adenopathy. Uterus is surgically absent. The bladder is normal. No inguinal mass or adenopathy.  Musculoskeletal: No significant bony findings. No CT evidence osseous metastatic disease.  IMPRESSION: 1. Slight interval  enlargement of the left upper lobe and right upper lobe pulmonary lesions. 2. No mediastinal or hilar mass or adenopathy. Stable small scattered mediastinal lymph nodes. 3. Stable small left lower lobe pulmonary nodule. No findings suspicious for pulmonary metastatic disease. 4. No findings for abdominal/pelvic metastatic disease. 5. Diffuse fatty infiltration of the liver. 6. Advanced atherosclerotic calcifications involving the aorta and iliac arteries.   Electronically Signed   By: PMarijo SanesM.D.   On: 03/15/2015 15:07   Ct Abdomen Pelvis W Contrast  03/15/2015   CLINICAL DATA:  Restaging adenocarcinoma of the left lung. Initial diagnosis January 2016. Ongoing chemotherapy.  Recist version 1.1  EXAM: CT CHEST, ABDOMEN, AND PELVIS WITH CONTRAST  TECHNIQUE: Multidetector CT imaging of the chest, abdomen and pelvis was performed following the standard protocol during bolus administration of intravenous contrast.  CONTRAST:  1059mOMNIPAQUE IOHEXOL 300 MG/ML  SOLN  COMPARISON:  12/28/2014 chest CT and 10/30/2014  abdomen CT scan. PET-CT 07/31/2014  FINDINGS: Recist 1.1  Target lesions:  1. 4.5 x 4.4 cm left upper lobe lung mass on image number 13. This previously measured 4.0 x 3.5 cm. Please note, the previous report stated right upper lobe but it is the left upper lobe. 2. 16 x 13 mm right upper lobe lesion on image 18. This previously measured 15 x 12.5 mm. Non target lesions:  1. None.  CT CHEST FINDINGS  Chest wall: No chest wall mass, supraclavicular or axillary lymphadenopathy. A right-sided Port-A-Cath is noted. The bony thorax is intact. No destructive bone lesions or spinal canal compromise.  Mediastinum: The heart is normal in size. No pericardial effusion. No enlarged mediastinal or hilar lymph nodes. A prevascular node on image number 23 is stable. A fatty pretracheal node is also stable. The esophagus is unremarkable and unchanged. Stable coronary artery calcifications.  Lungs/ pleura: Enlarging  left upper lobe pulmonary mass now measuring 4.5 x 4.4 cm. The right upper lobe pulmonary lesion is also slightly larger measuring 16 x 13 mm. Patchy ill-defined the area of scarring type change in the right upper lobe appears stable. Stable 3 mm left lower lobe pulmonary nodule on image number 31. No new pulmonary nodules, pleural effusion or acute pulmonary findings. Stable emphysematous changes in the upper lobes bilaterally.  CT ABDOMEN AND PELVIS FINDINGS  Hepatobiliary: Diffuse fatty infiltration of the liver but no focal hepatic lesions to suggest metastatic disease. The gallbladder is mildly distended. No common bile duct dilatation.  Pancreas: No mass, inflammation or ductal dilatation.  Spleen: Normal size.  No focal lesions.  Adrenals/Urinary Tract: The adrenal glands and kidneys are unremarkable and stable. Small renal cysts are noted.  Stomach/Bowel: The stomach, duodenum, small bowel and colon are unremarkable. No inflammatory changes, mass lesions or obstructive findings. The terminal ileum is normal.  Vascular/Lymphatic: No mesenteric or retroperitoneal mass or adenopathy. Stable advanced atherosclerotic calcifications involving the aorta and branch vessels.  Other: No pelvic mass or adenopathy. Uterus is surgically absent. The bladder is normal. No inguinal mass or adenopathy.  Musculoskeletal: No significant bony findings. No CT evidence osseous metastatic disease.  IMPRESSION: 1. Slight interval enlargement of the left upper lobe and right upper lobe pulmonary lesions. 2. No mediastinal or hilar mass or adenopathy. Stable small scattered mediastinal lymph nodes. 3. Stable small left lower lobe pulmonary nodule. No findings suspicious for pulmonary metastatic disease. 4. No findings for abdominal/pelvic metastatic disease. 5. Diffuse fatty infiltration of the liver. 6. Advanced atherosclerotic calcifications involving the aorta and iliac arteries.   Electronically Signed   By: Marijo Sanes M.D.    On: 03/15/2015 15:07    ASSESSMENT AND PLAN: This is a very pleasant 64 years old white female with stage IV non-small cell lung cancer currently undergoing systemic chemotherapy with carboplatin and Alimta status post 6 cycles.  She is currently undergoing maintenance chemotherapy with single agent Alimta status post 3 cycles and tolerating her treatment fairly well. The recent CT scan of the chest, abdomen and pelvis showed slight increase in her disease in the chest but not enough by RECIST criteria to call it disease progression. I discussed the scan results with the patient today. I recommended for her to continue with her current maintenance chemotherapy as scheduled. She will receive cycle #4 today. The patient would come back for follow-up visit in 3 weeks for reevaluation with the start of cycle #5. For depression, she will continue on Remeron 30 mg by mouth  daily at bedtime. For the acid reflux, the patient will continue Prilosec 20 mg by mouth twice a day. She was advised to call immediately if she has any concerning symptoms in the interval. The patient voices understanding of current disease status and treatment options and is in agreement with the current care plan.  All questions were answered. The patient knows to call the clinic with any problems, questions or concerns. We can certainly see the patient much sooner if necessary.  Disclaimer: This note was dictated with voice recognition software. Similar sounding words can inadvertently be transcribed and may not be corrected upon review.

## 2015-03-20 NOTE — Patient Instructions (Signed)
Cancer Center Discharge Instructions for Patients Receiving Chemotherapy  Today you received the following chemotherapy agents alimta  To help prevent nausea and vomiting after your treatment, we encourage you to take your nausea medication as directed  If you develop nausea and vomiting that is not controlled by your nausea medication, call the clinic.   BELOW ARE SYMPTOMS THAT SHOULD BE REPORTED IMMEDIATELY:  *FEVER GREATER THAN 100.5 F  *CHILLS WITH OR WITHOUT FEVER  NAUSEA AND VOMITING THAT IS NOT CONTROLLED WITH YOUR NAUSEA MEDICATION  *UNUSUAL SHORTNESS OF BREATH  *UNUSUAL BRUISING OR BLEEDING  TENDERNESS IN MOUTH AND THROAT WITH OR WITHOUT PRESENCE OF ULCERS  *URINARY PROBLEMS  *BOWEL PROBLEMS  UNUSUAL RASH Items with * indicate a potential emergency and should be followed up as soon as possible.  Feel free to call the clinic you have any questions or concerns. The clinic phone number is [phone].  

## 2015-03-20 NOTE — Progress Notes (Signed)
BMS [id number] - questionnaires (PROs) - patient into the cancer center for routine visit. Patient was given PROs upon arrival to the cancer center. The patient completed her PROs (EQ-5D-3L first and then the FACT-L) before any study procedures were performed. I checked the PROs for completeness.  Research blood was drawn. Patient's research blood was drawn 02/27/2015.  The patient was thanked for her continued support of this clinical trial.

## 2015-03-21 ENCOUNTER — Ambulatory Visit (HOSPITAL_COMMUNITY): Payer: Medicaid - Out of State

## 2015-03-30 ENCOUNTER — Other Ambulatory Visit: Payer: Self-pay | Admitting: Internal Medicine

## 2015-03-30 ENCOUNTER — Other Ambulatory Visit: Payer: Self-pay | Admitting: *Deleted

## 2015-03-30 MED ORDER — MIRTAZAPINE 30 MG PO TABS: 30.0000 mg | ORAL_TABLET | Freq: Every day | ORAL | Status: DC

## 2015-03-30 MED ORDER — OMEPRAZOLE 20 MG PO CPDR: DELAYED_RELEASE_CAPSULE | ORAL | Status: DC

## 2015-04-06 ENCOUNTER — Other Ambulatory Visit: Payer: Self-pay | Admitting: Medical Oncology

## 2015-04-06 ENCOUNTER — Encounter: Payer: Self-pay | Admitting: Medical Oncology

## 2015-04-06 DIAGNOSIS — C3490 Malignant neoplasm of unspecified part of unspecified bronchus or lung: Secondary | ICD-10-CM

## 2015-04-10 ENCOUNTER — Ambulatory Visit: Payer: Self-pay

## 2015-04-10 ENCOUNTER — Encounter: Payer: Self-pay | Admitting: Physician Assistant

## 2015-04-10 ENCOUNTER — Other Ambulatory Visit (HOSPITAL_COMMUNITY)
Admission: RE | Admit: 2015-04-10 | Discharge: 2015-04-10 | Disposition: A | Discharge status: Home / Self Care | Payer: Medicaid - Out of State | Source: Ambulatory Visit | Attending: Internal Medicine | Admitting: Internal Medicine

## 2015-04-10 ENCOUNTER — Ambulatory Visit (HOSPITAL_BASED_OUTPATIENT_CLINIC_OR_DEPARTMENT_OTHER): Payer: Self-pay

## 2015-04-10 ENCOUNTER — Encounter: Payer: Self-pay | Admitting: Oncology

## 2015-04-10 ENCOUNTER — Telehealth: Payer: Self-pay | Admitting: Physician Assistant

## 2015-04-10 ENCOUNTER — Encounter: Payer: Self-pay | Admitting: Medical Oncology

## 2015-04-10 ENCOUNTER — Other Ambulatory Visit (HOSPITAL_BASED_OUTPATIENT_CLINIC_OR_DEPARTMENT_OTHER): Payer: Self-pay

## 2015-04-10 ENCOUNTER — Ambulatory Visit (HOSPITAL_BASED_OUTPATIENT_CLINIC_OR_DEPARTMENT_OTHER): Payer: Self-pay | Admitting: Physician Assistant

## 2015-04-10 ENCOUNTER — Other Ambulatory Visit: Payer: Self-pay | Admitting: Medical Oncology

## 2015-04-10 VITALS — BP 144/79 | HR 84 | Temp 98.6°F | Resp 18 | Ht 64.0 in | Wt 206.1 lb

## 2015-04-10 DIAGNOSIS — Z720 Tobacco use: Secondary | ICD-10-CM | POA: Diagnosis not present

## 2015-04-10 DIAGNOSIS — C3411 Malignant neoplasm of upper lobe, right bronchus or lung: Secondary | ICD-10-CM

## 2015-04-10 DIAGNOSIS — Z5111 Encounter for antineoplastic chemotherapy: Secondary | ICD-10-CM

## 2015-04-10 DIAGNOSIS — C3490 Malignant neoplasm of unspecified part of unspecified bronchus or lung: Secondary | ICD-10-CM

## 2015-04-10 DIAGNOSIS — Z006 Encounter for examination for normal comparison and control in clinical research program: Secondary | ICD-10-CM

## 2015-04-10 DIAGNOSIS — J449 Chronic obstructive pulmonary disease, unspecified: Secondary | ICD-10-CM

## 2015-04-10 DIAGNOSIS — Z95828 Presence of other vascular implants and grafts: Secondary | ICD-10-CM

## 2015-04-10 DIAGNOSIS — Z23 Encounter for immunization: Secondary | ICD-10-CM

## 2015-04-10 DIAGNOSIS — C3412 Malignant neoplasm of upper lobe, left bronchus or lung: Secondary | ICD-10-CM

## 2015-04-10 LAB — CBC WITH DIFFERENTIAL/PLATELET
BASO%: 0.1 % (ref 0.0–2.0)
BASOS ABS: 0 10*3/uL (ref 0.0–0.1)
EOS%: 0.7 % (ref 0.0–7.0)
Eosinophils Absolute: 0.1 10*3/uL (ref 0.0–0.5)
HCT: 38.9 % (ref 34.8–46.6)
HEMOGLOBIN: 12.8 g/dL (ref 11.6–15.9)
LYMPH#: 1.6 10*3/uL (ref 0.9–3.3)
LYMPH%: 18.4 % (ref 14.0–49.7)
MCH: 31.4 pg (ref 25.1–34.0)
MCHC: 32.9 g/dL (ref 31.5–36.0)
MCV: 95.4 fL (ref 79.5–101.0)
MONO#: 0.7 10*3/uL (ref 0.1–0.9)
MONO%: 8.3 % (ref 0.0–14.0)
NEUT#: 6.4 10*3/uL (ref 1.5–6.5)
NEUT%: 72.5 % (ref 38.4–76.8)
Platelets: 387 10*3/uL (ref 145–400)
RBC: 4.08 10*6/uL (ref 3.70–5.45)
RDW: 17 % — AB (ref 11.2–14.5)
WBC: 8.8 10*3/uL (ref 3.9–10.3)

## 2015-04-10 LAB — COMPREHENSIVE METABOLIC PANEL (CC13)
ALT: 20 U/L (ref 0–55)
ANION GAP: 13 meq/L — AB (ref 3–11)
AST: 20 U/L (ref 5–34)
Albumin: 3.7 g/dL (ref 3.5–5.0)
Alkaline Phosphatase: 114 U/L (ref 40–150)
BUN: 12.6 mg/dL (ref 7.0–26.0)
CHLORIDE: 106 meq/L (ref 98–109)
CO2: 21 meq/L — AB (ref 22–29)
Calcium: 9.6 mg/dL (ref 8.4–10.4)
Creatinine: 0.7 mg/dL (ref 0.6–1.1)
GLUCOSE: 138 mg/dL (ref 70–140)
Potassium: 3.6 mEq/L (ref 3.5–5.1)
SODIUM: 140 meq/L (ref 136–145)
Total Bilirubin: <0.2 mg/dL (ref 0.20–1.20)
Total Protein: 7.5 g/dL (ref 6.4–8.3)

## 2015-04-10 LAB — RESEARCH LABS

## 2015-04-10 LAB — LACTATE DEHYDROGENASE (CC13): LDH: 214 U/L (ref 125–245)

## 2015-04-10 LAB — T3, FREE: T3 FREE: 1.7 pg/mL — AB (ref 2.3–4.2)

## 2015-04-10 LAB — TSH CHCC: TSH: 0.23 m[IU]/L — AB (ref 0.308–3.960)

## 2015-04-10 LAB — T4, FREE: Free T4: 0.76 ng/dL — ABNORMAL LOW (ref 0.80–1.80)

## 2015-04-10 LAB — MAGNESIUM (CC13): Magnesium: 2 mg/dl (ref 1.5–2.5)

## 2015-04-10 LAB — PHOSPHORUS: PHOSPHORUS: 3.5 mg/dL (ref 2.5–4.6)

## 2015-04-10 MED ORDER — HEPARIN SOD (PORK) LOCK FLUSH 100 UNIT/ML IV SOLN
500.0000 [IU] | Freq: Once | INTRAVENOUS | Status: AC
  Administered 2015-04-10: 500 [IU] via INTRAVENOUS
  Filled 2015-04-10: qty 5

## 2015-04-10 MED ORDER — SODIUM CHLORIDE 0.9 % IV SOLN
Freq: Once | INTRAVENOUS | Status: AC
  Administered 2015-04-10: 10:00:00 via INTRAVENOUS
  Filled 2015-04-10: qty 4

## 2015-04-10 MED ORDER — SODIUM CHLORIDE 0.9 % IV SOLN
Freq: Once | INTRAVENOUS | Status: AC
  Administered 2015-04-10: 10:00:00 via INTRAVENOUS

## 2015-04-10 MED ORDER — SODIUM CHLORIDE 0.9 % IV SOLN
1000.0000 mg | Freq: Once | INTRAVENOUS | Status: AC
  Administered 2015-04-10: 1000 mg via INTRAVENOUS
  Filled 2015-04-10: qty 40

## 2015-04-10 MED ORDER — INFLUENZA VAC SPLIT QUAD 0.5 ML IM SUSY
0.5000 mL | PREFILLED_SYRINGE | Freq: Once | INTRAMUSCULAR | Status: AC
  Administered 2015-04-10: 0.5 mL via INTRAMUSCULAR
  Filled 2015-04-10: qty 0.5

## 2015-04-10 MED ORDER — HEPARIN SOD (PORK) LOCK FLUSH 100 UNIT/ML IV SOLN
500.0000 [IU] | Freq: Once | INTRAVENOUS | Status: AC | PRN
  Filled 2015-04-10: qty 5

## 2015-04-10 MED ORDER — SODIUM CHLORIDE 0.9 % IJ SOLN
10.0000 mL | INTRAMUSCULAR | Status: DC | PRN
  Administered 2015-04-10: 10 mL
  Filled 2015-04-10: qty 10

## 2015-04-10 MED ORDER — SODIUM CHLORIDE 0.9 % IJ SOLN
10.0000 mL | INTRAMUSCULAR | Status: DC | PRN
  Administered 2015-04-10: 10 mL via INTRAVENOUS
  Filled 2015-04-10: qty 10

## 2015-04-10 NOTE — Patient Instructions (Signed)
[facility] Discharge Instructions for Patients Receiving Chemotherapy  Today you received the following chemotherapy agents:  Alimta  To help prevent nausea and vomiting after your treatment, we encourage you to take your nausea medication as ordered per MD.   If you develop nausea and vomiting that is not controlled by your nausea medication, call the clinic.   BELOW ARE SYMPTOMS THAT SHOULD BE REPORTED IMMEDIATELY:  *FEVER GREATER THAN 100.5 F  *CHILLS WITH OR WITHOUT FEVER  NAUSEA AND VOMITING THAT IS NOT CONTROLLED WITH YOUR NAUSEA MEDICATION  *UNUSUAL SHORTNESS OF BREATH  *UNUSUAL BRUISING OR BLEEDING  TENDERNESS IN MOUTH AND THROAT WITH OR WITHOUT PRESENCE OF ULCERS  *URINARY PROBLEMS  *BOWEL PROBLEMS  UNUSUAL RASH Items with * indicate a potential emergency and should be followed up as soon as possible.  Feel free to call the clinic you have any questions or concerns. The clinic phone number is [phone].  Please show the CHEMO ALERT CARD at check-in to the Emergency Department and triage nurse.   

## 2015-04-10 NOTE — Progress Notes (Signed)
BMS [id number] questionnaires (PRO's) - patient into cancer center for routine visit.  Patient was given PRO's upon arrival to the cancer center.  The patient completed her PRO's (EQ-5D-3L first and then the FACT-L) before any study procedures were performed.  I checked the PRO's for completeness.  The patient then had her research blood drawn 04/10/15. The patient was thanked for her continued support in this clinical trial. [name] 04/10/15 - 8:15 am

## 2015-04-10 NOTE — Telephone Encounter (Signed)
per pof to sch pt apt-gave pt copy of avs-sent MW email to sch trmt-pt awre of appt

## 2015-04-10 NOTE — Patient Instructions (Signed)

## 2015-04-10 NOTE — Progress Notes (Addendum)
[place] [phone]   [phone]  OFFICE PROGRESS NOTE  No PCP Per Patient No address on file  DIAGNOSIS: Stage IV (T2a, N0, M1a) non-small cell lung cancer, adenocarcinoma presented with bilateral pulmonary lesions. This could be also consider as synchronous primary tumors.  PRIOR THERAPY: Systemic chemotherapy with carboplatin for AUC of 5 and Alimta 500 MG/M2 every 3 weeks. Status post 6 cycles  CURRENT THERAPY: Participation and the BMS checkmate 370 clinical trial. Randomized to group a arm be 1 treatment with pemetrexed (Alimta) 500 mg/m given every 3 weeks. Status post 4 cycles  INTERVAL HISTORY: Heather Banks 64 y.o. female returns to the clinic today for follow-up visit accompanied by her friend. The patient is tolerating her treatment fairly well with no significant adverse effects. She denied having any significant weight loss or night sweats. She denied having any significant nausea or vomiting. She has no fever or chills. The patient denied having any significant chest pain, shortness breath, cough or hemoptysis. She requests a refill for her Spiriva as she will need to establish care with a pulmonologist near her home. She requests a flu shot today.  MEDICAL HISTORY: Past Medical History  Diagnosis Date  . COPD (chronic obstructive pulmonary disease)     Albuterol neb as needed;Pulmicort neb daily  . GERD (gastroesophageal reflux disease)     takes Pantoprazole and Zantac daily  . Lung mass   . Pneumonia     hx of-last time about 4+yrs ago  . History of bronchitis     >5 yrs ago  . History of migraine     last one 2 wks ago  . Panic attacks     but doesn't take any meds  . Encounter for antineoplastic chemotherapy 01/21/2015    ALLERGIES:  has No Known Allergies.  MEDICATIONS:  Current Outpatient Prescriptions  Medication Sig Dispense Refill  . albuterol (PROVENTIL) (2.5 MG/3ML) 0.083% nebulizer solution Take 3 mLs  (2.5 mg total) by nebulization every 6 (six) hours as needed for wheezing or shortness of breath. 75 mL 12  . ALPRAZolam (XANAX) 0.5 MG tablet Take 1 tablet (0.5 mg total) by mouth at bedtime as needed for anxiety. 30 tablet 0  . ALPRAZolam (XANAX) 0.5 MG tablet Take 1 tablet (0.5 mg total) by mouth at bedtime as needed for anxiety. 30 tablet 0  . aspirin-acetaminophen-caffeine (EXCEDRIN MIGRAINE) 563-149-70 MG per tablet Take 2 tablets by mouth every 6 (six) hours as needed for headache.    . dexamethasone (DECADRON) 4 MG tablet 4 mg by mouth twice a day the day before, day of and day after the chemotherapy every 3 weeks 40 tablet 1  . Fluticasone Furoate-Vilanterol 100-25 MCG/INH AEPB Inhale 1 puff into the lungs daily. 1 each 5  . folic acid (FOLVITE) 1 MG tablet Take 1 tablet (1 mg total) by mouth daily. 30 tablet 2  . folic acid (FOLVITE) 1 MG tablet TAKE 1 TABLET BY MOUTH EVERY DAY 30 tablet 0  . HYDROcodone-acetaminophen (NORCO/VICODIN) 5-325 MG per tablet Take 1 tablet by mouth every 6 (six) hours as needed for moderate pain or severe pain. (Patient not taking: Reported on 02/05/2015) 30 tablet 0  . lidocaine-prilocaine (EMLA) cream Apply 1 application topically as needed. (Patient taking differently: Apply 1 application topically as needed (For port-a-cath.). ) 30 g 1  . mirtazapine (REMERON) 30 MG tablet Take 1 tablet (30 mg total) by mouth at bedtime. 30 tablet 0  .  Multiple Vitamins-Minerals (MULTIVITAMIN WITH MINERALS) tablet Take 1 tablet by mouth daily.    . nicotine (NICODERM CQ) 21 mg/24hr patch Place 1 patch (21 mg total) onto the skin daily. 28 patch 0  . omeprazole (PRILOSEC) 20 MG capsule TAKE 1 CAPSULE BY MOUTH TWICE DAILY BEFORE A MEAL 60 capsule 0  . polyethylene glycol (MIRALAX / GLYCOLAX) packet Take 17 g by mouth daily. Takes 2 caps every night    . PRESCRIPTION MEDICATION She receives her treatments at the [facility]. Her oncologist is Dr. Julien Nordmann.  She received Alimta '925mg'$  and Carboplatin '560mg'$  on 08/30/14.    . Probiotic Product (PROBIOTIC DAILY PO) Take by mouth.    . prochlorperazine (COMPAZINE) 10 MG tablet Take 1 tablet (10 mg total) by mouth every 6 (six) hours as needed for nausea or vomiting. 60 tablet 0  . tiotropium (SPIRIVA HANDIHALER) 18 MCG inhalation capsule Place 1 capsule (18 mcg total) into inhaler and inhale daily. 30 capsule 5   No current facility-administered medications for this visit.    SURGICAL HISTORY:  Past Surgical History  Procedure Laterality Date  . Abdominal hysterectomy    . Oophorectomy    . Appendectomy    . Tonsillectomy    . Video bronchoscopy with endobronchial navigation N/A 08/14/2014    Procedure: VIDEO BRONCHOSCOPY WITH ENDOBRONCHIAL NAVIGATION;  Surgeon: Rigoberto Noel, MD;  Location: [facility];  Service: Thoracic;  Laterality: N/A;  . Portacath placement  jan. 2016    REVIEW OF SYSTEMS:  Constitutional: negative Eyes: negative Ears, nose, mouth, throat, and face: negative Respiratory: negative Cardiovascular: negative Gastrointestinal: negative Genitourinary:negative Integument/breast: negative Hematologic/lymphatic: negative Musculoskeletal:negative Neurological: negative Behavioral/Psych: negative Endocrine: negative Allergic/Immunologic: negative   PHYSICAL EXAMINATION: General appearance: alert, cooperative and no distress Head: Normocephalic, without obvious abnormality, atraumatic Neck: no adenopathy, no JVD, supple, symmetrical, trachea midline and thyroid not enlarged, symmetric, no tenderness/mass/nodules Lymph nodes: Cervical, supraclavicular, and axillary nodes normal. Resp: clear to auscultation bilaterally Back: symmetric, no curvature. ROM normal. No CVA tenderness. Cardio: regular rate and rhythm, S1, S2 normal, no murmur, click, rub or gallop GI: soft, non-tender; bowel sounds normal; no masses,  no organomegaly Extremities: extremities normal, atraumatic, no  cyanosis or edema Neurologic: Alert and oriented X 3, normal strength and tone. Normal symmetric reflexes. Normal coordination and gait  ECOG PERFORMANCE STATUS: 1 - Symptomatic but completely ambulatory  Blood pressure 144/79, pulse 84, temperature 98.6 F (37 C), temperature source Oral, resp. rate 18, height '5\' 4"'$  (1.626 m), weight 206 lb 1.6 oz (93.486 kg), SpO2 97 %.  LABORATORY DATA: Lab Results  Component Value Date   WBC 8.8 04/10/2015   HGB 12.8 04/10/2015   HCT 38.9 04/10/2015   MCV 95.4 04/10/2015   PLT 387 04/10/2015      Chemistry      Component Value Date/Time   NA 140 04/10/2015 0827   NA 138 08/11/2014 0837   K 3.6 04/10/2015 0827   K 3.8 08/11/2014 0837   CL 103 08/11/2014 0837   CO2 21* 04/10/2015 0827   CO2 23 08/11/2014 0837   BUN 12.6 04/10/2015 0827   BUN 8 08/11/2014 0837   CREATININE 0.7 04/10/2015 0827   CREATININE 0.57 08/11/2014 0837      Component Value Date/Time   CALCIUM 9.6 04/10/2015 0827   CALCIUM 9.0 08/11/2014 0837   ALKPHOS 114 04/10/2015 0827   ALKPHOS 145* 07/10/2014 0459   AST 20 04/10/2015 0827   AST 11 07/10/2014 0459  ALT 20 04/10/2015 0827   ALT 8 07/10/2014 0459   BILITOT <0.20 04/10/2015 0827   BILITOT <0.2* 07/10/2014 0459       RADIOGRAPHIC STUDIES: Ct Chest W Contrast  03/15/2015   CLINICAL DATA:  Restaging adenocarcinoma of the left lung. Initial diagnosis January 2016. Ongoing chemotherapy.  Recist version 1.1  EXAM: CT CHEST, ABDOMEN, AND PELVIS WITH CONTRAST  TECHNIQUE: Multidetector CT imaging of the chest, abdomen and pelvis was performed following the standard protocol during bolus administration of intravenous contrast.  CONTRAST:  127m OMNIPAQUE IOHEXOL 300 MG/ML  SOLN  COMPARISON:  12/28/2014 chest CT and 10/30/2014 abdomen CT scan. PET-CT 07/31/2014  FINDINGS: Recist 1.1  Target lesions:  1. 4.5 x 4.4 cm left upper lobe lung mass on image number 13. This previously measured 4.0 x 3.5 cm. Please note, the  previous report stated right upper lobe but it is the left upper lobe. 2. 16 x 13 mm right upper lobe lesion on image 18. This previously measured 15 x 12.5 mm. Non target lesions:  1. None.  CT CHEST FINDINGS  Chest wall: No chest wall mass, supraclavicular or axillary lymphadenopathy. A right-sided Port-A-Cath is noted. The bony thorax is intact. No destructive bone lesions or spinal canal compromise.  Mediastinum: The heart is normal in size. No pericardial effusion. No enlarged mediastinal or hilar lymph nodes. A prevascular node on image number 23 is stable. A fatty pretracheal node is also stable. The esophagus is unremarkable and unchanged. Stable coronary artery calcifications.  Lungs/ pleura: Enlarging left upper lobe pulmonary mass now measuring 4.5 x 4.4 cm. The right upper lobe pulmonary lesion is also slightly larger measuring 16 x 13 mm. Patchy ill-defined the area of scarring type change in the right upper lobe appears stable. Stable 3 mm left lower lobe pulmonary nodule on image number 31. No new pulmonary nodules, pleural effusion or acute pulmonary findings. Stable emphysematous changes in the upper lobes bilaterally.  CT ABDOMEN AND PELVIS FINDINGS  Hepatobiliary: Diffuse fatty infiltration of the liver but no focal hepatic lesions to suggest metastatic disease. The gallbladder is mildly distended. No common bile duct dilatation.  Pancreas: No mass, inflammation or ductal dilatation.  Spleen: Normal size.  No focal lesions.  Adrenals/Urinary Tract: The adrenal glands and kidneys are unremarkable and stable. Small renal cysts are noted.  Stomach/Bowel: The stomach, duodenum, small bowel and colon are unremarkable. No inflammatory changes, mass lesions or obstructive findings. The terminal ileum is normal.  Vascular/Lymphatic: No mesenteric or retroperitoneal mass or adenopathy. Stable advanced atherosclerotic calcifications involving the aorta and branch vessels.  Other: No pelvic mass or  adenopathy. Uterus is surgically absent. The bladder is normal. No inguinal mass or adenopathy.  Musculoskeletal: No significant bony findings. No CT evidence osseous metastatic disease.  IMPRESSION: 1. Slight interval enlargement of the left upper lobe and right upper lobe pulmonary lesions. 2. No mediastinal or hilar mass or adenopathy. Stable small scattered mediastinal lymph nodes. 3. Stable small left lower lobe pulmonary nodule. No findings suspicious for pulmonary metastatic disease. 4. No findings for abdominal/pelvic metastatic disease. 5. Diffuse fatty infiltration of the liver. 6. Advanced atherosclerotic calcifications involving the aorta and iliac arteries.   Electronically Signed   By: PMarijo SanesM.D.   On: 03/15/2015 15:07   Ct Abdomen Pelvis W Contrast  03/15/2015   CLINICAL DATA:  Restaging adenocarcinoma of the left lung. Initial diagnosis January 2016. Ongoing chemotherapy.  Recist version 1.1  EXAM: CT CHEST, ABDOMEN, AND PELVIS  WITH CONTRAST  TECHNIQUE: Multidetector CT imaging of the chest, abdomen and pelvis was performed following the standard protocol during bolus administration of intravenous contrast.  CONTRAST:  154m OMNIPAQUE IOHEXOL 300 MG/ML  SOLN  COMPARISON:  12/28/2014 chest CT and 10/30/2014 abdomen CT scan. PET-CT 07/31/2014  FINDINGS: Recist 1.1  Target lesions:  1. 4.5 x 4.4 cm left upper lobe lung mass on image number 13. This previously measured 4.0 x 3.5 cm. Please note, the previous report stated right upper lobe but it is the left upper lobe. 2. 16 x 13 mm right upper lobe lesion on image 18. This previously measured 15 x 12.5 mm. Non target lesions:  1. None.  CT CHEST FINDINGS  Chest wall: No chest wall mass, supraclavicular or axillary lymphadenopathy. A right-sided Port-A-Cath is noted. The bony thorax is intact. No destructive bone lesions or spinal canal compromise.  Mediastinum: The heart is normal in size. No pericardial effusion. No enlarged mediastinal or  hilar lymph nodes. A prevascular node on image number 23 is stable. A fatty pretracheal node is also stable. The esophagus is unremarkable and unchanged. Stable coronary artery calcifications.  Lungs/ pleura: Enlarging left upper lobe pulmonary mass now measuring 4.5 x 4.4 cm. The right upper lobe pulmonary lesion is also slightly larger measuring 16 x 13 mm. Patchy ill-defined the area of scarring type change in the right upper lobe appears stable. Stable 3 mm left lower lobe pulmonary nodule on image number 31. No new pulmonary nodules, pleural effusion or acute pulmonary findings. Stable emphysematous changes in the upper lobes bilaterally.  CT ABDOMEN AND PELVIS FINDINGS  Hepatobiliary: Diffuse fatty infiltration of the liver but no focal hepatic lesions to suggest metastatic disease. The gallbladder is mildly distended. No common bile duct dilatation.  Pancreas: No mass, inflammation or ductal dilatation.  Spleen: Normal size.  No focal lesions.  Adrenals/Urinary Tract: The adrenal glands and kidneys are unremarkable and stable. Small renal cysts are noted.  Stomach/Bowel: The stomach, duodenum, small bowel and colon are unremarkable. No inflammatory changes, mass lesions or obstructive findings. The terminal ileum is normal.  Vascular/Lymphatic: No mesenteric or retroperitoneal mass or adenopathy. Stable advanced atherosclerotic calcifications involving the aorta and branch vessels.  Other: No pelvic mass or adenopathy. Uterus is surgically absent. The bladder is normal. No inguinal mass or adenopathy.  Musculoskeletal: No significant bony findings. No CT evidence osseous metastatic disease.  IMPRESSION: 1. Slight interval enlargement of the left upper lobe and right upper lobe pulmonary lesions. 2. No mediastinal or hilar mass or adenopathy. Stable small scattered mediastinal lymph nodes. 3. Stable small left lower lobe pulmonary nodule. No findings suspicious for pulmonary metastatic disease. 4. No findings  for abdominal/pelvic metastatic disease. 5. Diffuse fatty infiltration of the liver. 6. Advanced atherosclerotic calcifications involving the aorta and iliac arteries.   Electronically Signed   By: PMarijo SanesM.D.   On: 03/15/2015 15:07    ASSESSMENT AND PLAN: This is a very pleasant 64years old white female with stage IV non-small cell lung cancer currently undergoing systemic chemotherapy with carboplatin and Alimta status post 6 cycles.  She is currently undergoing maintenance chemotherapy with single agent Alimta status post 3 cycles and tolerating her treatment fairly well. The recent CT scan of the chest, abdomen and pelvis showed slight increase in her disease in the chest but not enough by RECIST criteria to call it disease progression. The patient was discussed with and also seen by Dr. MJulien Nordmann Her TSH level was  a bit low today however she is completely asymptomatic. We'll continue to follow this closely on subsequent visits. We recommended for her to continue with her current maintenance chemotherapy as scheduled. She will receive cycle #5 today. The patient would come back for a follow-up visit in 3 weeks for reevaluation with the start of cycle #6. For depression, she will continue on Remeron 30 mg by mouth daily at bedtime. For the acid reflux, the patient will continue Prilosec 20 mg by mouth twice a day. She was given a one-month refill for her Spiriva with no refills to bridge her until she establishes with a pulmonologist near her home. She was advised to call immediately if she has any concerning symptoms in the interval. The patient voices understanding of current disease status and treatment options and is in agreement with the current care plan.  All questions were answered. The patient knows to call the clinic with any problems, questions or concerns. We can certainly see the patient much sooner if necessary.   Carlton Adam,  PA-C 04/10/2015  ADDENDUM: Hematology/Oncology Attending: I had a face to face encounter with the patient. I recommended her care plan. This is a very pleasant 64 years old white female with a stage IV non-small cell lung cancer status post induction chemotherapy with carboplatin and Alimta and she is currently on a clinical trial and randomized to maintenance treatment with single agent Alimta status post 4 cycles. The patient is tolerating the treatment well with no significant adverse effects. I recommended for her to proceed with cycle #5 today as a scheduled. The patient requested refill of her inhalers daily until she establish care with a pulmonologist close to home. I gave her refill of Spiriva 1 time.  For depression and acid reflux the patient will continue with her current medication. She would come back for follow-up visit in 3 weeks for reevaluation before starting the next cycle of her treatment. The patient was advised to call immediately if she has any concerning symptoms in the interval.  Disclaimer: This note was dictated with voice recognition software. Similar sounding words can inadvertently be transcribed and may be missed upon review. [name]., MD 04/14/2015

## 2015-04-10 NOTE — Progress Notes (Signed)
BMS 370: Cycle 5/Week 12 Patient here for shared appointment with PA, Awilda Metro and Dr. Julien Nordmann. I met with patient in exam room after patient completed study required PRO's and research lab draw. Resting vitals completed, O2 sat @ 97%, patient with 3 pound weight loss and per patient this is intentional. Per patient no new medications or changes to current medications. Patient continues to report nonworsening fatigue and denies any new symptoms at today's visit. Patient denies vomiting, bowel or bladder issues, denies pain or changes in breathing, denies skin or neuro problems/concerns. Per PA and MD assessment and review of A/E's, labs and NCS labs, patient cleared for cycle 5 treatment today. Patient's TSH resulted low, Free T3 and Free T4 ordered, results pending, and per MD, will follow at this time. Patient requested to receive flu shot with today's appointment, per MD ok for patient to have. All patient's questions answered to her satisfaction, patient denies further questions at this time. I thanked patient for her continued support of study and encouraged her to call Dr. Julien Nordmann or myself with any questions or concerns she may have.  Infusion treatment sign given to Thane Edu, RN and all questions answered. Patient scheduled for Cycle 6 Adele Dan, RN, Clinical Research 04/10/2015 11:50 AM

## 2015-04-11 ENCOUNTER — Telehealth: Payer: Self-pay | Admitting: Internal Medicine

## 2015-04-11 NOTE — Telephone Encounter (Signed)
Scheduled treatment to schedule per staff msg.... KJ

## 2015-04-13 NOTE — Patient Instructions (Signed)
Follow-up in 3 weeks prior to next scheduled cycle of maintenance chemotherapy 

## 2015-04-26 ENCOUNTER — Telehealth: Payer: Self-pay | Admitting: Medical Oncology

## 2015-04-26 ENCOUNTER — Other Ambulatory Visit: Payer: Self-pay | Admitting: Medical Oncology

## 2015-04-26 DIAGNOSIS — C3490 Malignant neoplasm of unspecified part of unspecified bronchus or lung: Secondary | ICD-10-CM

## 2015-04-26 NOTE — Telephone Encounter (Signed)
BMS 370: Group A, Arm B1 LVMOM with patient informing her of upcoming CT scan needing to be scheduled and for her to know to expect call from schedulers to set that appointment up for either 10/17 or 10/18. Encouraged patient to call me back with any questions or concerns. Patient has scheduled appointment on 10/04 lab, MD and Cycle 6 treatment.

## 2015-04-27 ENCOUNTER — Other Ambulatory Visit: Payer: Self-pay | Admitting: Internal Medicine

## 2015-05-01 ENCOUNTER — Encounter: Payer: Self-pay | Admitting: Medical Oncology

## 2015-05-01 ENCOUNTER — Ambulatory Visit (HOSPITAL_BASED_OUTPATIENT_CLINIC_OR_DEPARTMENT_OTHER): Payer: Medicaid - Out of State

## 2015-05-01 ENCOUNTER — Ambulatory Visit (HOSPITAL_BASED_OUTPATIENT_CLINIC_OR_DEPARTMENT_OTHER): Payer: Medicaid - Out of State | Admitting: Internal Medicine

## 2015-05-01 ENCOUNTER — Other Ambulatory Visit (HOSPITAL_BASED_OUTPATIENT_CLINIC_OR_DEPARTMENT_OTHER): Payer: Self-pay

## 2015-05-01 ENCOUNTER — Telehealth: Payer: Self-pay | Admitting: Internal Medicine

## 2015-05-01 ENCOUNTER — Other Ambulatory Visit (HOSPITAL_COMMUNITY)
Admission: RE | Admit: 2015-05-01 | Discharge: 2015-05-01 | Disposition: A | Discharge status: Home / Self Care | Payer: Self-pay | Source: Ambulatory Visit | Attending: Internal Medicine | Admitting: Internal Medicine

## 2015-05-01 ENCOUNTER — Encounter: Payer: Self-pay | Admitting: Internal Medicine

## 2015-05-01 VITALS — BP 127/76 | HR 85 | Temp 98.0°F | Resp 18 | Ht 64.0 in | Wt 202.0 lb

## 2015-05-01 DIAGNOSIS — C3490 Malignant neoplasm of unspecified part of unspecified bronchus or lung: Secondary | ICD-10-CM

## 2015-05-01 DIAGNOSIS — Z006 Encounter for examination for normal comparison and control in clinical research program: Secondary | ICD-10-CM

## 2015-05-01 DIAGNOSIS — C3411 Malignant neoplasm of upper lobe, right bronchus or lung: Secondary | ICD-10-CM

## 2015-05-01 DIAGNOSIS — Z5111 Encounter for antineoplastic chemotherapy: Secondary | ICD-10-CM

## 2015-05-01 DIAGNOSIS — C3412 Malignant neoplasm of upper lobe, left bronchus or lung: Secondary | ICD-10-CM

## 2015-05-01 DIAGNOSIS — K219 Gastro-esophageal reflux disease without esophagitis: Secondary | ICD-10-CM

## 2015-05-01 DIAGNOSIS — Z789 Other specified health status: Secondary | ICD-10-CM

## 2015-05-01 DIAGNOSIS — F329 Major depressive disorder, single episode, unspecified: Secondary | ICD-10-CM

## 2015-05-01 HISTORY — DX: Other specified health status: Z78.9

## 2015-05-01 LAB — COMPREHENSIVE METABOLIC PANEL (CC13)
ALT: 27 U/L (ref 0–55)
ANION GAP: 13 meq/L — AB (ref 3–11)
AST: 23 U/L (ref 5–34)
Albumin: 3.5 g/dL (ref 3.5–5.0)
Alkaline Phosphatase: 110 U/L (ref 40–150)
BUN: 7 mg/dL (ref 7.0–26.0)
CALCIUM: 9.5 mg/dL (ref 8.4–10.4)
CO2: 24 meq/L (ref 22–29)
CREATININE: 0.7 mg/dL (ref 0.6–1.1)
Chloride: 105 mEq/L (ref 98–109)
EGFR: >90 mL/min/{1.73_m2} (ref >90)
Glucose: 110 mg/dl (ref 70–140)
Potassium: 3.3 mEq/L — ABNORMAL LOW (ref 3.5–5.1)
Sodium: 142 mEq/L (ref 136–145)
TOTAL PROTEIN: 7.2 g/dL (ref 6.4–8.3)

## 2015-05-01 LAB — CBC WITH DIFFERENTIAL/PLATELET
BASO%: 0.1 % (ref 0.0–2.0)
BASOS ABS: 0 10*3/uL (ref 0.0–0.1)
EOS ABS: 0.1 10*3/uL (ref 0.0–0.5)
EOS%: 1 % (ref 0.0–7.0)
HEMATOCRIT: 36.7 % (ref 34.8–46.6)
HEMOGLOBIN: 12.1 g/dL (ref 11.6–15.9)
LYMPH#: 2.2 10*3/uL (ref 0.9–3.3)
LYMPH%: 28.1 % (ref 14.0–49.7)
MCH: 31 pg (ref 25.1–34.0)
MCHC: 33 g/dL (ref 31.5–36.0)
MCV: 94.1 fL (ref 79.5–101.0)
MONO#: 1.2 10*3/uL — AB (ref 0.1–0.9)
MONO%: 15.6 % — ABNORMAL HIGH (ref 0.0–14.0)
NEUT#: 4.4 10*3/uL (ref 1.5–6.5)
NEUT%: 55.2 % (ref 38.4–76.8)
PLATELETS: 312 10*3/uL (ref 145–400)
RBC: 3.9 10*6/uL (ref 3.70–5.45)
RDW: 16.1 % — AB (ref 11.2–14.5)
WBC: 7.9 10*3/uL (ref 3.9–10.3)

## 2015-05-01 LAB — LACTATE DEHYDROGENASE (CC13): LDH: 196 U/L (ref 125–245)

## 2015-05-01 LAB — MAGNESIUM (CC13): MAGNESIUM: 1.9 mg/dL (ref 1.5–2.5)

## 2015-05-01 LAB — PHOSPHORUS: PHOSPHORUS: 3.4 mg/dL (ref 2.5–4.6)

## 2015-05-01 MED ORDER — SODIUM CHLORIDE 0.9 % IJ SOLN
10.0000 mL | INTRAMUSCULAR | Status: DC | PRN
  Administered 2015-05-01: 10 mL
  Filled 2015-05-01: qty 10

## 2015-05-01 MED ORDER — HEPARIN SOD (PORK) LOCK FLUSH 100 UNIT/ML IV SOLN
500.0000 [IU] | Freq: Once | INTRAVENOUS | Status: AC | PRN
  Administered 2015-05-01: 500 [IU]
  Filled 2015-05-01: qty 5

## 2015-05-01 MED ORDER — SODIUM CHLORIDE 0.9 % IV SOLN
1000.0000 mg | Freq: Once | INTRAVENOUS | Status: AC
  Administered 2015-05-01: 1000 mg via INTRAVENOUS
  Filled 2015-05-01: qty 40

## 2015-05-01 MED ORDER — SODIUM CHLORIDE 0.9 % IV SOLN
Freq: Once | INTRAVENOUS | Status: AC
  Administered 2015-05-01: 11:00:00 via INTRAVENOUS

## 2015-05-01 MED ORDER — DEXAMETHASONE SODIUM PHOSPHATE 100 MG/10ML IJ SOLN
Freq: Once | INTRAMUSCULAR | Status: AC
  Administered 2015-05-01: 11:00:00 via INTRAVENOUS
  Filled 2015-05-01: qty 4

## 2015-05-01 NOTE — Telephone Encounter (Signed)
Pt confirmed labs/ov per 10/04 POF, gave pt AVS and Calendar.... KJ, gave pt barium

## 2015-05-01 NOTE — Patient Instructions (Signed)
[facility] Discharge Instructions for Patients Receiving Chemotherapy  Today you received the following chemotherapy agents; Alimta.   To help prevent nausea and vomiting after your treatment, we encourage you to take your nausea medication as directed.    If you develop nausea and vomiting that is not controlled by your nausea medication, call the clinic.   BELOW ARE SYMPTOMS THAT SHOULD BE REPORTED IMMEDIATELY:  *FEVER GREATER THAN 100.5 F  *CHILLS WITH OR WITHOUT FEVER  NAUSEA AND VOMITING THAT IS NOT CONTROLLED WITH YOUR NAUSEA MEDICATION  *UNUSUAL SHORTNESS OF BREATH  *UNUSUAL BRUISING OR BLEEDING  TENDERNESS IN MOUTH AND THROAT WITH OR WITHOUT PRESENCE OF ULCERS  *URINARY PROBLEMS  *BOWEL PROBLEMS  UNUSUAL RASH Items with * indicate a potential emergency and should be followed up as soon as possible.  Feel free to call the clinic you have any questions or concerns. The clinic phone number is [phone].  Please show the CHEMO ALERT CARD at check-in to the Emergency Department and triage nurse.   

## 2015-05-01 NOTE — Progress Notes (Signed)
BMS 370: Group A, Arm B1, Cycle 6/week 15 Met with patient in exam room after patient's lab appointment. Resting vitals obtained, O2 saturation @ 97%, B/P @ 127/76, weight @ 202 lbs, patient has had a 4 pound weight loss since last visit, and per patient this is intentional as she states she is trying to lose some weight. Patient reports to have some sinus drainage due to allergies, along with a dry cough and states she has started taking OTC Mucinex daily, started yesterday. Patient reports to have improvement in energy. Patient denies pain/vomiting/bowel or bladder issues/skin rashes/neurological symptoms/change in breathing. Patient's potassium resulted today @ 3.3 and per MD, patient to increase potassium rich foods in her diet, gave verbal understanding. Patient reports no new symptoms or concerns at this visit. Per Dr. Worthy Flank assessment today of patient and review of her labs as well as NCS labs, patient cleared to receive treatment Cycle 6 today. Patient was also informed of her TSH, Free T3 and Free T4 resulting low, from 09/13. Reviewed with Dr. Julien Nordmann and will continue to monitor, per protocol no intervention required at this time, and patient is asymptomatic. Patient expressed understanding.  Patient informed that next CT will be scheduled for October 17 or 18 and she should receive a call from central scheduling regarding this appointment. All patient's questions answered to her statisfaction, patient denies further questions at this time and was encouraged to to call clinic with any questions/concerns. Adele Dan, RN, BSN Clinical Research 05/01/2015 1:10 PM

## 2015-05-01 NOTE — Patient Instructions (Signed)
Smoking Cessation Quitting smoking is important to your health and has many advantages. However, it is not always easy to quit since nicotine is a very addictive drug. Oftentimes, people try 3 times or more before being able to quit. This document explains the best ways for you to prepare to quit smoking. Quitting takes hard work and a lot of effort, but you can do it. ADVANTAGES OF QUITTING SMOKING  You will live longer, feel better, and live better.  Your body will feel the impact of quitting smoking almost immediately.  Within 20 minutes, blood pressure decreases. Your pulse returns to its normal level.  After 8 hours, carbon monoxide levels in the blood return to normal. Your oxygen level increases.  After 24 hours, the chance of having a heart attack starts to decrease. Your breath, hair, and body stop smelling like smoke.  After 48 hours, damaged nerve endings begin to recover. Your sense of taste and smell improve.  After 72 hours, the body is virtually free of nicotine. Your bronchial tubes relax and breathing becomes easier.  After 2 to 12 weeks, lungs can hold more air. Exercise becomes easier and circulation improves.  The risk of having a heart attack, stroke, cancer, or lung disease is greatly reduced.  After 1 year, the risk of coronary heart disease is cut in half.  After 5 years, the risk of stroke falls to the same as a nonsmoker.  After 10 years, the risk of lung cancer is cut in half and the risk of other cancers decreases significantly.  After 15 years, the risk of coronary heart disease drops, usually to the level of a nonsmoker.  If you are pregnant, quitting smoking will improve your chances of having a healthy baby.  The people you live with, especially any children, will be healthier.  You will have extra money to spend on things other than cigarettes. QUESTIONS TO THINK ABOUT BEFORE ATTEMPTING TO QUIT You may want to talk about your answers with your  health care provider.  Why do you want to quit?  If you tried to quit in the past, what helped and what did not?  What will be the most difficult situations for you after you quit? How will you plan to handle them?  Who can help you through the tough times? Your family? Friends? A health care provider?  What pleasures do you get from smoking? What ways can you still get pleasure if you quit? Here are some questions to ask your health care provider:  How can you help me to be successful at quitting?  What medicine do you think would be best for me and how should I take it?  What should I do if I need more help?  What is smoking withdrawal like? How can I get information on withdrawal? GET READY  Set a quit date.  Change your environment by getting rid of all cigarettes, ashtrays, matches, and lighters in your home, car, or work. Do not let people smoke in your home.  Review your past attempts to quit. Think about what worked and what did not. GET SUPPORT AND ENCOURAGEMENT You have a better chance of being successful if you have help. You can get support in many ways.  Tell your family, friends, and coworkers that you are going to quit and need their support. Ask them not to smoke around you.  Get individual, group, or telephone counseling and support. Programs are available at local hospitals and health centers. Call   your local health department for information about programs in your area.  Spiritual beliefs and practices may help some smokers quit.  Download a "quit meter" on your computer to keep track of quit statistics, such as how long you have gone without smoking, cigarettes not smoked, and money saved.  Get a self-help book about quitting smoking and staying off tobacco. LEARN NEW SKILLS AND BEHAVIORS  Distract yourself from urges to smoke. Talk to someone, go for a walk, or occupy your time with a task.  Change your normal routine. Take a different route to work.  Drink tea instead of coffee. Eat breakfast in a different place.  Reduce your stress. Take a hot bath, exercise, or read a book.  Plan something enjoyable to do every day. Reward yourself for not smoking.  Explore interactive web-based programs that specialize in helping you quit. GET MEDICINE AND USE IT CORRECTLY Medicines can help you stop smoking and decrease the urge to smoke. Combining medicine with the above behavioral methods and support can greatly increase your chances of successfully quitting smoking.  Nicotine replacement therapy helps deliver nicotine to your body without the negative effects and risks of smoking. Nicotine replacement therapy includes nicotine gum, lozenges, inhalers, nasal sprays, and skin patches. Some may be available over-the-counter and others require a prescription.  Antidepressant medicine helps people abstain from smoking, but how this works is unknown. This medicine is available by prescription.  Nicotinic receptor partial agonist medicine simulates the effect of nicotine in your brain. This medicine is available by prescription. Ask your health care provider for advice about which medicines to use and how to use them based on your health history. Your health care provider will tell you what side effects to look out for if you choose to be on a medicine or therapy. Carefully read the information on the package. Do not use any other product containing nicotine while using a nicotine replacement product.  RELAPSE OR DIFFICULT SITUATIONS Most relapses occur within the first 3 months after quitting. Do not be discouraged if you start smoking again. Remember, most people try several times before finally quitting. You may have symptoms of withdrawal because your body is used to nicotine. You may crave cigarettes, be irritable, feel very hungry, cough often, get headaches, or have difficulty concentrating. The withdrawal symptoms are only temporary. They are strongest  when you first quit, but they will go away within 10-14 days. To reduce the chances of relapse, try to:  Avoid drinking alcohol. Drinking lowers your chances of successfully quitting.  Reduce the amount of caffeine you consume. Once you quit smoking, the amount of caffeine in your body increases and can give you symptoms, such as a rapid heartbeat, sweating, and anxiety.  Avoid smokers because they can make you want to smoke.  Do not let weight gain distract you. Many smokers will gain weight when they quit, usually less than 10 pounds. Eat a healthy diet and stay active. You can always lose the weight gained after you quit.  Find ways to improve your mood other than smoking. FOR MORE INFORMATION  www.smokefree.gov  Document Released: 07/08/2001 Document Revised: 11/28/2013 Document Reviewed: 10/23/2011 Exit[employer] Patient Information 2015 [employer]. This information is not intended to replace advice given to you by your health care provider. Make sure you discuss any questions you have with your health care provider.  

## 2015-05-01 NOTE — Progress Notes (Signed)
[place] [phone]   Fax:([phone]) [phone]  OFFICE PROGRESS NOTE  No PCP Per Patient No address on file  DIAGNOSIS: Stage IV (T2a, N0, M1a) non-small cell lung cancer, adenocarcinoma presented with bilateral pulmonary lesions. This could be also consider as synchronous primary tumors.  PRIOR THERAPY: Systemic chemotherapy with carboplatin for AUC of 5 and Alimta 500 MG/M2 every 3 weeks. Status post 6 cycles  CURRENT THERAPY: Treatment according to the BMS checkmate 370 clinical trial. Randomized to group A arm B1 treatment with pemetrexed (Alimta) 500 mg/m given every 3 weeks. Status post 5 cycles.  INTERVAL HISTORY: Heather Banks 64 y.o. female returns to the clinic today for follow-up visit. She is currently on maintenance systemic chemotherapy with single agent Alimta status post 5 cycles and tolerating her treatment fairly well. She denied having any significant weight loss or night sweats. She denied having any significant nausea or vomiting. She has no fever or chills. The patient denied having any significant chest pain, shortness breath, but has mild cough secondary to postnasal drainage with no hemoptysis. She is here today to start cycle #6 of her maintenance chemotherapy.  MEDICAL HISTORY: Past Medical History  Diagnosis Date  . COPD (chronic obstructive pulmonary disease) (HCC)     Albuterol neb as needed;Pulmicort neb daily  . GERD (gastroesophageal reflux disease)     takes Pantoprazole and Zantac daily  . Lung mass   . Pneumonia     hx of-last time about 4+yrs ago  . History of bronchitis     >5 yrs ago  . History of migraine     last one 2 wks ago  . Panic attacks     but doesn't take any meds  . Encounter for antineoplastic chemotherapy 01/21/2015    ALLERGIES:  has No Known Allergies.  MEDICATIONS:  Current Outpatient Prescriptions  Medication Sig Dispense Refill  . albuterol (PROVENTIL) (2.5 MG/3ML) 0.083% nebulizer  solution Take 3 mLs (2.5 mg total) by nebulization every 6 (six) hours as needed for wheezing or shortness of breath. 75 mL 12  . ALPRAZolam (XANAX) 0.5 MG tablet Take 1 tablet (0.5 mg total) by mouth at bedtime as needed for anxiety. 30 tablet 0  . ALPRAZolam (XANAX) 0.5 MG tablet Take 1 tablet (0.5 mg total) by mouth at bedtime as needed for anxiety. 30 tablet 0  . aspirin-acetaminophen-caffeine (EXCEDRIN MIGRAINE) 629-476-54 MG per tablet Take 2 tablets by mouth every 6 (six) hours as needed for headache.    . dexamethasone (DECADRON) 4 MG tablet 4 mg by mouth twice a day the day before, day of and day after the chemotherapy every 3 weeks 40 tablet 1  . Fluticasone Furoate-Vilanterol 100-25 MCG/INH AEPB Inhale 1 puff into the lungs daily. 1 each 5  . folic acid (FOLVITE) 1 MG tablet Take 1 tablet (1 mg total) by mouth daily. 30 tablet 2  . folic acid (FOLVITE) 1 MG tablet TAKE 1 TABLET BY MOUTH EVERY DAY 30 tablet 0  . HYDROcodone-acetaminophen (NORCO/VICODIN) 5-325 MG per tablet Take 1 tablet by mouth every 6 (six) hours as needed for moderate pain or severe pain. (Patient not taking: Reported on 02/05/2015) 30 tablet 0  . lidocaine-prilocaine (EMLA) cream Apply 1 application topically as needed. (Patient taking differently: Apply 1 application topically as needed (For port-a-cath.). ) 30 g 1  . mirtazapine (REMERON) 30 MG tablet Take 1 tablet (30 mg total) by mouth at bedtime. 30 tablet 0  . Multiple  Vitamins-Minerals (MULTIVITAMIN WITH MINERALS) tablet Take 1 tablet by mouth daily.    . nicotine (NICODERM CQ) 21 mg/24hr patch Place 1 patch (21 mg total) onto the skin daily. 28 patch 0  . omeprazole (PRILOSEC) 20 MG capsule TAKE 1 CAPSULE BY MOUTH TWICE DAILY BEFORE A MEAL 60 capsule 0  . polyethylene glycol (MIRALAX / GLYCOLAX) packet Take 17 g by mouth daily. Takes 2 caps every night    . PRESCRIPTION MEDICATION She receives her treatments at the [facility]. Her  oncologist is Dr. Julien Nordmann. She received Alimta '925mg'$  and Carboplatin '560mg'$  on 08/30/14.    . Probiotic Product (PROBIOTIC DAILY PO) Take by mouth.    . prochlorperazine (COMPAZINE) 10 MG tablet Take 1 tablet (10 mg total) by mouth every 6 (six) hours as needed for nausea or vomiting. 60 tablet 0  . tiotropium (SPIRIVA HANDIHALER) 18 MCG inhalation capsule Place 1 capsule (18 mcg total) into inhaler and inhale daily. 30 capsule 5   No current facility-administered medications for this visit.    SURGICAL HISTORY:  Past Surgical History  Procedure Laterality Date  . Abdominal hysterectomy    . Oophorectomy    . Appendectomy    . Tonsillectomy    . Video bronchoscopy with endobronchial navigation N/A 08/14/2014    Procedure: VIDEO BRONCHOSCOPY WITH ENDOBRONCHIAL NAVIGATION;  Surgeon: Rigoberto Noel, MD;  Location: [facility];  Service: Thoracic;  Laterality: N/A;  . Portacath placement  jan. 2016    REVIEW OF SYSTEMS:  A comprehensive review of systems was negative except for: Respiratory: positive for cough   PHYSICAL EXAMINATION: General appearance: alert, cooperative and no distress Head: Normocephalic, without obvious abnormality, atraumatic Neck: no adenopathy, no JVD, supple, symmetrical, trachea midline and thyroid not enlarged, symmetric, no tenderness/mass/nodules Lymph nodes: Cervical, supraclavicular, and axillary nodes normal. Resp: clear to auscultation bilaterally Back: symmetric, no curvature. ROM normal. No CVA tenderness. Cardio: regular rate and rhythm, S1, S2 normal, no murmur, click, rub or gallop GI: soft, non-tender; bowel sounds normal; no masses,  no organomegaly Extremities: extremities normal, atraumatic, no cyanosis or edema Neurologic: Alert and oriented X 3, normal strength and tone. Normal symmetric reflexes. Normal coordination and gait  ECOG PERFORMANCE STATUS: 1 - Symptomatic but completely ambulatory  Blood pressure 127/76, pulse 85, temperature 98 F (36.7  C), temperature source Oral, resp. rate 18, height '5\' 4"'$  (1.626 m), weight 202 lb (91.627 kg), SpO2 97 %.  LABORATORY DATA: Lab Results  Component Value Date   WBC 7.9 05/01/2015   HGB 12.1 05/01/2015   HCT 36.7 05/01/2015   MCV 94.1 05/01/2015   PLT 312 05/01/2015      Chemistry      Component Value Date/Time   NA 142 05/01/2015 0829   NA 138 08/11/2014 0837   K 3.3* 05/01/2015 0829   K 3.8 08/11/2014 0837   CL 103 08/11/2014 0837   CO2 24 05/01/2015 0829   CO2 23 08/11/2014 0837   BUN 7.0 05/01/2015 0829   BUN 8 08/11/2014 0837   CREATININE 0.7 05/01/2015 0829   CREATININE 0.57 08/11/2014 0837      Component Value Date/Time   CALCIUM 9.5 05/01/2015 0829   CALCIUM 9.0 08/11/2014 0837   ALKPHOS 110 05/01/2015 0829   ALKPHOS 145* 07/10/2014 0459   AST 23 05/01/2015 0829   AST 11 07/10/2014 0459   ALT 27 05/01/2015 0829   ALT 8 07/10/2014 0459   BILITOT <0.30 05/01/2015 0829   BILITOT <0.2*  07/10/2014 0459       RADIOGRAPHIC STUDIES: No results found.  ASSESSMENT AND PLAN: This is a very pleasant 64 years old white female with stage IV non-small cell lung cancer currently undergoing systemic chemotherapy with carboplatin and Alimta status post 6 cycles.  She is currently undergoing maintenance chemotherapy with single agent Alimta status post 5 cycles and tolerating her treatment fairly well.  I recommended for her to continue with her current maintenance chemotherapy as scheduled. She will receive cycle #6 today. The patient would come back for follow-up visit in 3 weeks for reevaluation with the start of cycle #7 after repeating CT scan of the chest, abdomen and pelvis for restaging of her disease. For depression, she will continue on Remeron 30 mg by mouth daily at bedtime. For the acid reflux, the patient will continue Prilosec 20 mg by mouth twice a day. CODE STATUS was discussed with the patient and she indicated FULL CODE STATUS but no prolonged support if she  is in a vegetative state. She was advised to call immediately if she has any concerning symptoms in the interval. The patient voices understanding of current disease status and treatment options and is in agreement with the current care plan.  All questions were answered. The patient knows to call the clinic with any problems, questions or concerns. We can certainly see the patient much sooner if necessary.  Disclaimer: This note was dictated with voice recognition software. Similar sounding words can inadvertently be transcribed and may not be corrected upon review.

## 2015-05-01 NOTE — Progress Notes (Signed)
BMS 370: Group A, Arm B1, Cycle 6/week 15 Met with patient in exam room after patient's lab appointment. Resting vitals obtained, O2 saturation @ 97%, B/P @ 127/76, weight @ 202, patient has had a 4 pound weight loss since last visit, and per patient this is intentional as she states she is trying to lose some weight. Patient reports to have some sinus drainage due to allergies, along with a dry cough and states she has started taking OTC Mucinex daily, started yesterday. Patient reports to have improvement in energy. Patient denies pain/vomiting/bowel or bladder issues/skin rashes/neurological symptoms/change in breathing. Patient's potassium resulted today @ 3.3 and per MD, patient to increase potassium rich foods in her diet, gave verbal understanding. Patient reports no new symptoms or concerns at this visit. Per Dr. Worthy Flank assessment today of patient and review of labs as well as NCS labs, patient cleared to receive treatment Cycle 6 today. Patient informed that next CT will be scheduled for October 17 or 18 and she should receive a call from central scheduling regarding this appointment.

## 2015-05-03 ENCOUNTER — Other Ambulatory Visit: Payer: Self-pay | Admitting: Medical Oncology

## 2015-05-03 DIAGNOSIS — C3490 Malignant neoplasm of unspecified part of unspecified bronchus or lung: Secondary | ICD-10-CM

## 2015-05-03 MED ORDER — OMEPRAZOLE 20 MG PO CPDR: DELAYED_RELEASE_CAPSULE | ORAL | Status: DC

## 2015-05-03 MED ORDER — FOLIC ACID 1 MG PO TABS: 1.0000 mg | ORAL_TABLET | Freq: Every day | ORAL | Status: DC

## 2015-05-15 ENCOUNTER — Ambulatory Visit (HOSPITAL_COMMUNITY)
Admission: RE | Admit: 2015-05-15 | Discharge: 2015-05-15 | Disposition: A | Discharge status: Home / Self Care | Payer: Self-pay | Source: Ambulatory Visit | Attending: Internal Medicine | Admitting: Internal Medicine

## 2015-05-15 ENCOUNTER — Encounter (HOSPITAL_COMMUNITY): Payer: Self-pay

## 2015-05-15 DIAGNOSIS — C3490 Malignant neoplasm of unspecified part of unspecified bronchus or lung: Secondary | ICD-10-CM | POA: Insufficient documentation

## 2015-05-15 DIAGNOSIS — Z08 Encounter for follow-up examination after completed treatment for malignant neoplasm: Secondary | ICD-10-CM | POA: Insufficient documentation

## 2015-05-15 MED ORDER — IOHEXOL 300 MG/ML  SOLN
100.0000 mL | Freq: Once | INTRAMUSCULAR | Status: AC | PRN
  Administered 2015-05-15: 100 mL via INTRAVENOUS

## 2015-05-21 ENCOUNTER — Telehealth: Payer: Self-pay | Admitting: Pulmonary Disease

## 2015-05-21 ENCOUNTER — Other Ambulatory Visit: Payer: Self-pay | Admitting: Medical Oncology

## 2015-05-21 DIAGNOSIS — C3490 Malignant neoplasm of unspecified part of unspecified bronchus or lung: Secondary | ICD-10-CM

## 2015-05-21 NOTE — Telephone Encounter (Signed)
Pl place her on list to call if I add office

## 2015-05-21 NOTE — Telephone Encounter (Signed)
Per 02/05/15 OV W/ TP pt needs OV w/ RA in November. Nothing available. Are you wanting pt to see TP again? thanks

## 2015-05-22 ENCOUNTER — Telehealth: Payer: Self-pay | Admitting: *Deleted

## 2015-05-22 ENCOUNTER — Ambulatory Visit: Payer: Self-pay

## 2015-05-22 ENCOUNTER — Encounter: Payer: Self-pay | Admitting: Medical Oncology

## 2015-05-22 ENCOUNTER — Other Ambulatory Visit (HOSPITAL_BASED_OUTPATIENT_CLINIC_OR_DEPARTMENT_OTHER): Payer: Self-pay

## 2015-05-22 ENCOUNTER — Other Ambulatory Visit: Payer: Self-pay | Admitting: Physician Assistant

## 2015-05-22 ENCOUNTER — Other Ambulatory Visit: Payer: Self-pay | Admitting: Medical Oncology

## 2015-05-22 ENCOUNTER — Ambulatory Visit (HOSPITAL_BASED_OUTPATIENT_CLINIC_OR_DEPARTMENT_OTHER): Payer: Self-pay | Admitting: Physician Assistant

## 2015-05-22 ENCOUNTER — Encounter: Payer: Self-pay | Admitting: Physician Assistant

## 2015-05-22 ENCOUNTER — Other Ambulatory Visit (HOSPITAL_COMMUNITY)
Admission: RE | Admit: 2015-05-22 | Discharge: 2015-05-22 | Disposition: A | Discharge status: Home / Self Care | Payer: Self-pay | Source: Ambulatory Visit | Attending: Internal Medicine | Admitting: Internal Medicine

## 2015-05-22 VITALS — BP 136/83 | HR 89 | Temp 97.8°F | Resp 18 | Ht 64.0 in | Wt 194.7 lb

## 2015-05-22 DIAGNOSIS — C3490 Malignant neoplasm of unspecified part of unspecified bronchus or lung: Secondary | ICD-10-CM

## 2015-05-22 DIAGNOSIS — C3492 Malignant neoplasm of unspecified part of left bronchus or lung: Secondary | ICD-10-CM

## 2015-05-22 DIAGNOSIS — R110 Nausea: Secondary | ICD-10-CM

## 2015-05-22 DIAGNOSIS — C3491 Malignant neoplasm of unspecified part of right bronchus or lung: Secondary | ICD-10-CM

## 2015-05-22 DIAGNOSIS — Z006 Encounter for examination for normal comparison and control in clinical research program: Secondary | ICD-10-CM

## 2015-05-22 DIAGNOSIS — Z79899 Other long term (current) drug therapy: Secondary | ICD-10-CM

## 2015-05-22 DIAGNOSIS — R5382 Chronic fatigue, unspecified: Secondary | ICD-10-CM

## 2015-05-22 LAB — COMPREHENSIVE METABOLIC PANEL (CC13)
ALT: 28 U/L (ref 0–55)
ANION GAP: 12 meq/L — AB (ref 3–11)
AST: 27 U/L (ref 5–34)
Albumin: 3.7 g/dL (ref 3.5–5.0)
Alkaline Phosphatase: 158 U/L — ABNORMAL HIGH (ref 40–150)
BUN: 8 mg/dL (ref 7.0–26.0)
CHLORIDE: 103 meq/L (ref 98–109)
CO2: 25 meq/L (ref 22–29)
CREATININE: 0.7 mg/dL (ref 0.6–1.1)
Calcium: 10.4 mg/dL (ref 8.4–10.4)
EGFR: >90 mL/min/{1.73_m2} (ref >90)
Glucose: 102 mg/dl (ref 70–140)
POTASSIUM: 3.4 meq/L — AB (ref 3.5–5.1)
Sodium: 140 mEq/L (ref 136–145)
Total Bilirubin: <0.3 mg/dL (ref 0.20–1.20)
Total Protein: 7.7 g/dL (ref 6.4–8.3)

## 2015-05-22 LAB — CBC WITH DIFFERENTIAL/PLATELET
BASO%: 0.2 % (ref 0.0–2.0)
BASOS ABS: 0 10*3/uL (ref 0.0–0.1)
EOS ABS: 0.1 10*3/uL (ref 0.0–0.5)
EOS%: 1.3 % (ref 0.0–7.0)
HCT: 41 % (ref 34.8–46.6)
HGB: 13.5 g/dL (ref 11.6–15.9)
LYMPH%: 25.2 % (ref 14.0–49.7)
MCH: 30.3 pg (ref 25.1–34.0)
MCHC: 33 g/dL (ref 31.5–36.0)
MCV: 92.1 fL (ref 79.5–101.0)
MONO#: 1.2 10*3/uL — ABNORMAL HIGH (ref 0.1–0.9)
MONO%: 12.6 % (ref 0.0–14.0)
NEUT#: 5.8 10*3/uL (ref 1.5–6.5)
NEUT%: 60.7 % (ref 38.4–76.8)
Platelets: 420 10*3/uL — ABNORMAL HIGH (ref 145–400)
RBC: 4.46 10*6/uL (ref 3.70–5.45)
RDW: 17.9 % — ABNORMAL HIGH (ref 11.2–14.5)
WBC: 9.5 10*3/uL (ref 3.9–10.3)
lymph#: 2.4 10*3/uL (ref 0.9–3.3)

## 2015-05-22 LAB — PHOSPHORUS: Phosphorus: 3 mg/dL (ref 2.5–4.6)

## 2015-05-22 LAB — MAGNESIUM (CC13): Magnesium: 1.9 mg/dl (ref 1.5–2.5)

## 2015-05-22 LAB — T3, FREE: T3 FREE: 2.6 pg/mL (ref 2.3–4.2)

## 2015-05-22 LAB — TSH CHCC: TSH: 0.674 m[IU]/L (ref 0.308–3.960)

## 2015-05-22 LAB — LACTATE DEHYDROGENASE (CC13): LDH: 227 U/L (ref 125–245)

## 2015-05-22 LAB — T4, FREE: FREE T4: 0.99 ng/dL (ref 0.80–1.80)

## 2015-05-22 MED ORDER — PROCHLORPERAZINE MALEATE 10 MG PO TABS: 10.0000 mg | ORAL_TABLET | Freq: Four times a day (QID) | ORAL | Status: DC | PRN

## 2015-05-22 NOTE — Telephone Encounter (Signed)
Placed on call list for Nov appt with RA Called and advised patient.  Nothing further needed.

## 2015-05-22 NOTE — Telephone Encounter (Signed)
Per staff message and POF I have scheduled appts. Advised scheduler of appts. JMW  

## 2015-05-22 NOTE — Progress Notes (Signed)
[place] [phone]   [phone]) [phone]  OFFICE PROGRESS NOTE  No PCP Per Patient No address on file  DIAGNOSIS: Stage IV (T2a, N0, M1a) non-small cell lung cancer, adenocarcinoma presented with bilateral pulmonary lesions. This could be also consider as synchronous primary tumors.  PRIOR THERAPY:  1) Systemic chemotherapy with carboplatin for AUC of 5 and Alimta 500 MG/M2 every 3 weeks. Status post 6 cycles 2) Treatment according to the BMS checkmate 370 clinical trial. Randomized to group A arm B1 treatment with pemetrexed (Alimta) 500 mg/m given every 3 weeks. Status post 6 cycles, discontinued secondary to disease progression.  CURRENT THERAPY: Immunotherapy with at Tecentriq (Atezolizumab) 1200 mg IV every 3 weeks. First dose 05/29/2015.  INTERVAL HISTORY: Heather Banks 64 y.o. female returns to the clinic today for follow-up visit. She is currently on maintenance systemic chemotherapy with single agent Alimta status post 6 cycles and was tolerating her treatment fairly well. She denied having any significant weight loss or night sweats. She reports mild nausea and intermittent motion sickness controlled with compazine. She denies any abdominal pain. She denies any vomiting. She reports occasional loose stools, but no frank diarrhea. She has no fever or chills. The patient denied having any significant chest pain or palpitations. She has intermittent shortness breath due to recent cold, with non productive cough, which is improving since last week. She denies any hemoptysis. She is ambulating without difficulty. No bleeding issues are reported.   MEDICAL HISTORY: Past Medical History  Diagnosis Date  . COPD (chronic obstructive pulmonary disease) (HCC)     Albuterol neb as needed;Pulmicort neb daily  . GERD (gastroesophageal reflux disease)     takes Pantoprazole and Zantac daily  . Lung mass   . Pneumonia     hx of-last time about 4+yrs ago  .  History of bronchitis     >5 yrs ago  . History of migraine     last one 2 wks ago  . Panic attacks     but doesn't take any meds  . Encounter for antineoplastic chemotherapy 01/21/2015  . Full code status 05/01/2015    ALLERGIES:  has No Known Allergies.  MEDICATIONS:  Current Outpatient Prescriptions  Medication Sig Dispense Refill  . prochlorperazine (COMPAZINE) 10 MG tablet Take 1 tablet (10 mg total) by mouth every 6 (six) hours as needed for nausea or vomiting. 30 tablet 0  . albuterol (PROVENTIL) (2.5 MG/3ML) 0.083% nebulizer solution Take 3 mLs (2.5 mg total) by nebulization every 6 (six) hours as needed for wheezing or shortness of breath. 75 mL 12  . ALPRAZolam (XANAX) 0.5 MG tablet Take 1 tablet (0.5 mg total) by mouth at bedtime as needed for anxiety. 30 tablet 0  . aspirin-acetaminophen-caffeine (EXCEDRIN MIGRAINE) 937-169-67 MG per tablet Take 2 tablets by mouth every 6 (six) hours as needed for headache.    . dexamethasone (DECADRON) 4 MG tablet 4 mg by mouth twice a day the day before, day of and day after the chemotherapy every 3 weeks 40 tablet 1  . Fluticasone Furoate-Vilanterol 100-25 MCG/INH AEPB Inhale 1 puff into the lungs daily. 1 each 5  . folic acid (FOLVITE) 1 MG tablet Take 1 tablet (1 mg total) by mouth daily. 30 tablet 2  . HYDROcodone-acetaminophen (NORCO/VICODIN) 5-325 MG per tablet Take 1 tablet by mouth every 6 (six) hours as needed for moderate pain or severe pain. (Patient not taking: Reported on 02/05/2015) 30 tablet 0  .  lidocaine-prilocaine (EMLA) cream Apply 1 application topically as needed. (Patient taking differently: Apply 1 application topically as needed (For port-a-cath.). ) 30 g 1  . mirtazapine (REMERON) 30 MG tablet Take 1 tablet (30 mg total) by mouth at bedtime. 30 tablet 0  . Multiple Vitamins-Minerals (MULTIVITAMIN WITH MINERALS) tablet Take 1 tablet by mouth daily.    . nicotine (NICODERM CQ) 21 mg/24hr patch Place 1 patch (21 mg total)  onto the skin daily. 28 patch 0  . omeprazole (PRILOSEC) 20 MG capsule TAKE 1 CAPSULE BY MOUTH TWICE DAILY BEFORE A MEAL 60 capsule 0  . polyethylene glycol (MIRALAX / GLYCOLAX) packet Take 17 g by mouth daily. Takes 2 caps every night    . PRESCRIPTION MEDICATION She receives her treatments at the [facility]. Her oncologist is Dr. Julien Nordmann. She received Alimta '925mg'$  and Carboplatin '560mg'$  on 08/30/14.    . Probiotic Product (PROBIOTIC DAILY PO) Take by mouth.    . tiotropium (SPIRIVA HANDIHALER) 18 MCG inhalation capsule Place 1 capsule (18 mcg total) into inhaler and inhale daily. 30 capsule 5   No current facility-administered medications for this visit.    SURGICAL HISTORY:  Past Surgical History  Procedure Laterality Date  . Abdominal hysterectomy    . Oophorectomy    . Appendectomy    . Tonsillectomy    . Video bronchoscopy with endobronchial navigation N/A 08/14/2014    Procedure: VIDEO BRONCHOSCOPY WITH ENDOBRONCHIAL NAVIGATION;  Surgeon: Rigoberto Noel, MD;  Location: [facility];  Service: Thoracic;  Laterality: N/A;  . Portacath placement  jan. 2016    REVIEW OF SYSTEMS:  A comprehensive review of systems was negative except for: Respiratory: positive for cough and tiredness. Positive for decreased appetite due to recent cough.   PHYSICAL EXAMINATION: General appearance: alert, cooperative and no distress Head: Normocephalic, without obvious abnormality, atraumatic Neck: no adenopathy, no JVD, supple, symmetrical, trachea midline and thyroid not enlarged, symmetric, no tenderness/mass/nodules Lymph nodes: Cervical, supraclavicular, and axillary nodes normal. Resp:mild expiratory wheezing without rhonchi or rales.  Back: symmetric, no curvature. ROM normal. No CVA tenderness. Cardio: regular rate and rhythm, S1, S2 normal, no murmur, click, rub or gallop GI: soft, non-tender; bowel sounds normal; no masses,  no organomegaly Extremities: extremities normal,  atraumatic, no cyanosis or edema Neurologic: Alert and oriented X 3, normal strength and tone. Normal symmetric reflexes. Normal coordination and gait  ECOG PERFORMANCE STATUS: 1 - Symptomatic but completely ambulatory  Blood pressure 136/83, pulse 89, temperature 97.8 F (36.6 C), temperature source Oral, resp. rate 18, height '5\' 4"'$  (1.626 m), weight 194 lb 11.2 oz (88.315 kg), SpO2 97 %.  CBC Latest Ref Rng 05/22/2015 05/01/2015 04/10/2015  WBC 3.9 - 10.3 10e3/uL 9.5 7.9 8.8  Hemoglobin 11.6 - 15.9 g/dL 13.5 12.1 12.8  Hematocrit 34.8 - 46.6 % 41.0 36.7 38.9  Platelets 145 - 400 10e3/uL 420(H) 312 387     CMP Latest Ref Rng 05/22/2015 05/01/2015 04/10/2015  Glucose 70 - 140 mg/dl 102 110 138  BUN 7.0 - 26.0 mg/dL 8.0 7.0 12.6  Creatinine 0.6 - 1.1 mg/dL 0.7 0.7 0.7  Sodium 136 - 145 mEq/L 140 142 140  Potassium 3.5 - 5.1 mEq/L 3.4(L) 3.3(L) 3.6  Chloride 96 - 112 mEq/L - - -  CO2 22 - 29 mEq/L 25 24 21(L)  Calcium 8.4 - 10.4 mg/dL 10.4 9.5 9.6  Total Protein 6.4 - 8.3 g/dL 7.7 7.2 7.5  Total Bilirubin 0.20 - 1.20 mg/dL <0.30 <0.30 <  0.20  Alkaline Phos 40 - 150 U/L 158(H) 110 114  AST 5 - 34 U/L '27 23 20  '$ ALT 0 - 55 U/L '28 27 20     '$ RADIOGRAPHIC STUDIES:  05/15/2015  CT Chest with Contrast CT Abdomen and pelvis with contrast    COMPARISON:  Chest CT 03/15/2015.   CT CHEST FINDINGS Mediastinum/Nodes: Previously noted prevascular lymph nodes remain prominent but nonenlarged measuring up to 8 mm. Borderline enlarged left hilar lymph nodes measuring up to 8 mm are unchanged. No other mediastinal or hilar lymphadenopathy is noted. Heart size is normal. There is no significant pericardial fluid, thickening or pericardial calcification. There is atherosclerosis of the thoracic aorta, the great vessels of the mediastinum and the coronary arteries, including calcified atherosclerotic plaque in the left anterior descending, left circumflex and right coronary arteries. Esophagus is  unremarkable in appearance. No axillary lymphadenopathy. Lungs/Pleura: Left upper lobe mass (image 15 of series 5) is minimally increased in size in size compared to the prior study measuring 4.9 x 4.6 cm on today's examination (4.4 x 4.5 cm on the prior). Right upper lobe pulmonary nodule (image 19 of series 5) is significantly larger than the prior examination, currently measuring 22 x 18 mm (previously 13 x 16 mm). Rounded area of architectural distortion, ground-glass attenuation, septal thickening and cystic change in the anterior right upper lobe measuring 3.4 x 2.8 cm, similar to prior examinations, including nodularity along the medial margin (image 25 of series 5) where the largest of these nodules measures 11 mm (image 25 of series 5), suspicious for an additional focus of adenocarcinoma. No acute consolidative airspace disease. No pleural effusions. Mild diffuse bronchial wall thickening with mild centrilobular and paraseptal emphysema. Musculoskeletal: There are no aggressive appearing lytic or blastic lesions noted in the visualized portions of the skeleton.  CT ABDOMEN PELVIS FINDINGS Hepatobiliary: No cystic or solid hepatic lesions. No intra or extrahepatic biliary ductal dilatation. Gallbladder is normal in appearance. Pancreas: No pancreatic mass. No pancreatic ductal dilatation. No pancreatic or peripancreatic fluid or inflammatory changes. Spleen: Unremarkable. Adrenals/Urinary Tract: Mild thickening of the left adrenal gland without definite nodule, similar to prior studies. Right adrenal gland is normal in appearance. Normal appearance of the left kidney. Small low-attenuation lesions in the right kidney measuring up to 2.1 cm in diameter in the interpolar region, compatible with simple cysts. No hydroureteronephrosis or perinephric stranding to indicate urinary tract obstruction. Urinary bladder is normal in appearance. Stomach/Bowel: Normal appearance of the stomach. No pathologic dilatation  of small bowel or colon. Vascular/Lymphatic: Extensive atherosclerosis throughout the abdominal and pelvic vasculature, with fusiform ectasia of the infrarenal abdominal aorta which measures up to 2.8 cm in diameter. No lymphadenopathy noted in the abdomen or pelvis. Reproductive: Status post hysterectomy. Ovaries are not confidently identified may be surgically absent or atrophic. Other: No significant volume of ascites.  No pneumoperitoneum. Musculoskeletal: There are no aggressive appearing lytic or blastic lesions noted in the visualized portions of the skeleton.   IMPRESSION: Study demonstrates slight progression of disease as evidenced by interval enlargement of both target lesions, as discussed above. 2. The more sub-solid appearing mass-like area in the anterior aspect of the right upper lobe is essentially unchanged compared to prior examinations, but is also concerning for an additional focus of adenocarcinoma. 3. No signs of metastatic disease to the abdomen or pelvis. 4. Additional incidental findings, as above. Electronically Signed   By: Vinnie Langton M.D.   On: 05/15/2015 13:48   ASSESSMENT  AND PLAN: This is a very pleasant 64 years old white female with   Stage IV non-small cell lung cancer She was currently undergoing maintenance chemotherapy with single agent Alimta status post 6 cycles and tolerating her treatment fairly well.  After reviewing the CT of the chest, abdomen and pelvis, and due to slight enlargement of target lesions, the patient is no longer a candidate for this current trial.Cycle #7 was discontinued Case was discussed with patient, and will begin Tecentric immunotherapy. She will be the first patient treated with this drug at the [facility] She will start, pending on approval, in 1 week and every 3 weeks.  She will be seen by Dr. Julien Nordmann in 4 weeks with labs, prior to cycle 2 of immunotherapy for follow up visit. Research nurse, Rubin Payor, was present during  patient's visit. Patient agrees with plan   Depression She will continue on Remeron 30 mg by mouth daily at bedtime.  GERD This is stable with current regimen The patient will continue Prilosec 20 mg by mouth twice a day.  Nausea Controlled with Compazine as needed. A prescription for Compazine 10 mg q 6 hrs prn nausea was printed per patient request as she ran out of medicine.   CODE STATUS was discussed with the patient and she indicated FULL CODE STATUS but no prolonged support if she is in a vegetative state.  She was advised to call immediately if she has any concerning symptoms in the interval. The patient voices understanding of current disease status and treatment options and is in agreement with the current care plan.  All questions were answered. The patient knows to call the clinic with any problems, questions or concerns. We can certainly see the patient much sooner if necessary.  ADDENDUM: Hematology/Oncology Attending: I had a face to face encounter with the patient today. I recommended her care plan. This is a very pleasant 64 years old with a stage IV non-small cell lung cancer status post induction chemotherapy with carboplatin and Alimta followed by 6 cycles of maintenance treatment with single agent Alimta on BMS clinical trial discontinued today secondary to disease progression. I had a lengthy discussion with the patient today about her current disease status and treatment options. I recommended for the patient treatment with immunotherapy under give her the option of treatment with Nivolumab every 2 weeks versus treatment with Tecentriq (Atezolizumab) every 3 weeks. The patient lives in [place] and she preferred treatment every 3 weeks. She will be treated with Tecentriq (Atezolizumab) 1200 mg IV every 3 weeks. She is expected to start the first dose of this treatment next week. I discussed with the patient adverse effect of this treatment including but not  limited to immune mediated pneumonitis, liver, renal, skin rash, diarrhea, thyroid or other endocrine dysfunction. The patient would come back for follow-up visit in 4 weeks for reevaluation with the start of cycle #2. She was advised to call immediately if she has any concerning symptoms in the interval.   Disclaimer: This note was dictated with voice recognition software. Similar sounding words can inadvertently be transcribed and may be missed upon review. [name]., MD 05/22/2015

## 2015-05-22 NOTE — Progress Notes (Signed)
[id number]: Group A, Arm B-1 Cycle 7/week 18  I met with patient this morning after her lab appointment and prior to her shared visit with PA Sharene Butters and Dr. Julien Nordmann. Resting vitals obtained: B/P 136/83, HR 89, O2 saturation @ 97%, temp 97.8, Resp @ 18, patient with an 8 lb weight loss since last appointment. Patient attributes weight loss to decrease in appetite (x 3 weeks now) related to a cold and cough that she has had. Patient reports to have started with a cough and congestion a "few days" after her treatment on October 4th. Patient confirms to have a clear productive cough, and has been having some SOB especially when coughing. Patient denies fever, denies pain, denies any skin rashes or neurological concerns, continues with baseline occassional nausea. Patient did report having a bout of diarrhea 1x per day, in the mornings x 4 days which has resolved as of this morning, per patient. Patient confirms her energy level has decreased since the onset of these cold-like symptoms. Reviewed with patient skin sensitivity to the chest area that she reported earlier in treatment, cycle 2. Patient reports that skin sensitivity symptoms had resolved approximately prior to cycle 4 when she started taking decadron only once per day for treatment days. Dr. Julien Nordmann reviewed patient's Ct scan from October 18th, with patient. Per scan results, Dr. Julien Nordmann had informed patient that she has progressed, and is recommending a new treatment due to progression on alimta. Dr. Julien Nordmann to start patient on Tecentriq next week. Patient expressed understanding and agreement to the new plan. Patient informed for study purposes she will be in follow-up as of today. Patient expressed understanding and verbal agreement. Patient informed that her potassium continues to be just below normal limits @ 3.4 and was told to continue with increasing potassium rich foods in her diet, patient gave verbal understanding. All patient's questions  answered to her satisfaction, patient thanked for continued support of study and was encouraged to call Dr. Julien Nordmann or myself with any questions or concerns. Patient scheduled to return to clinic next week.  Adele Dan, RN, BSN Clinical Research 05/22/2015 11:21 AM

## 2015-05-23 ENCOUNTER — Telehealth: Payer: Self-pay | Admitting: Internal Medicine

## 2015-05-23 NOTE — Telephone Encounter (Signed)
lvm for pt regareding to NOV appt.Marland KitchenMarland KitchenMarland KitchenMarland Kitchenpt ok and aware...per MD staff ok to sched MD visit in 4weeks.

## 2015-05-25 ENCOUNTER — Other Ambulatory Visit: Payer: Self-pay | Admitting: Medical Oncology

## 2015-05-25 DIAGNOSIS — C3490 Malignant neoplasm of unspecified part of unspecified bronchus or lung: Secondary | ICD-10-CM

## 2015-05-28 ENCOUNTER — Telehealth: Payer: Self-pay | Admitting: Medical Oncology

## 2015-05-28 ENCOUNTER — Other Ambulatory Visit: Payer: Self-pay | Admitting: Medical Oncology

## 2015-05-28 DIAGNOSIS — C3490 Malignant neoplasm of unspecified part of unspecified bronchus or lung: Secondary | ICD-10-CM

## 2015-05-28 NOTE — Telephone Encounter (Signed)
Pt asking if she needs to pre med with decadron. I told her no.

## 2015-05-29 ENCOUNTER — Encounter: Payer: Self-pay | Admitting: Medical Oncology

## 2015-05-29 ENCOUNTER — Other Ambulatory Visit (HOSPITAL_BASED_OUTPATIENT_CLINIC_OR_DEPARTMENT_OTHER): Payer: Self-pay

## 2015-05-29 ENCOUNTER — Ambulatory Visit (HOSPITAL_BASED_OUTPATIENT_CLINIC_OR_DEPARTMENT_OTHER): Payer: Self-pay

## 2015-05-29 VITALS — BP 134/85 | HR 87 | Temp 98.2°F

## 2015-05-29 DIAGNOSIS — C3490 Malignant neoplasm of unspecified part of unspecified bronchus or lung: Secondary | ICD-10-CM

## 2015-05-29 DIAGNOSIS — C3492 Malignant neoplasm of unspecified part of left bronchus or lung: Secondary | ICD-10-CM

## 2015-05-29 DIAGNOSIS — C3491 Malignant neoplasm of unspecified part of right bronchus or lung: Secondary | ICD-10-CM

## 2015-05-29 DIAGNOSIS — R5382 Chronic fatigue, unspecified: Secondary | ICD-10-CM

## 2015-05-29 DIAGNOSIS — Z5112 Encounter for antineoplastic immunotherapy: Secondary | ICD-10-CM

## 2015-05-29 LAB — CBC WITH DIFFERENTIAL/PLATELET
BASO%: 1.2 % (ref 0.0–2.0)
BASOS ABS: 0.1 10*3/uL (ref 0.0–0.1)
EOS ABS: 0.3 10*3/uL (ref 0.0–0.5)
EOS%: 3.7 % (ref 0.0–7.0)
HEMATOCRIT: 42.2 % (ref 34.8–46.6)
HEMOGLOBIN: 13.7 g/dL (ref 11.6–15.9)
LYMPH#: 2.2 10*3/uL (ref 0.9–3.3)
LYMPH%: 27 % (ref 14.0–49.7)
MCH: 30.2 pg (ref 25.1–34.0)
MCHC: 32.6 g/dL (ref 31.5–36.0)
MCV: 92.7 fL (ref 79.5–101.0)
MONO#: 1.2 10*3/uL — AB (ref 0.1–0.9)
MONO%: 14 % (ref 0.0–14.0)
NEUT%: 54.1 % (ref 38.4–76.8)
NEUTROS ABS: 4.5 10*3/uL (ref 1.5–6.5)
Platelets: 337 10*3/uL (ref 145–400)
RBC: 4.55 10*6/uL (ref 3.70–5.45)
RDW: 17.8 % — AB (ref 11.2–14.5)
WBC: 8.3 10*3/uL (ref 3.9–10.3)

## 2015-05-29 LAB — COMPREHENSIVE METABOLIC PANEL (CC13)
ALT: 15 U/L (ref 0–55)
ANION GAP: 10 meq/L (ref 3–11)
AST: 17 U/L (ref 5–34)
Albumin: 3.4 g/dL — ABNORMAL LOW (ref 3.5–5.0)
Alkaline Phosphatase: 143 U/L (ref 40–150)
BUN: 8.9 mg/dL (ref 7.0–26.0)
CALCIUM: 9.8 mg/dL (ref 8.4–10.4)
CHLORIDE: 107 meq/L (ref 98–109)
CO2: 23 mEq/L (ref 22–29)
CREATININE: 0.7 mg/dL (ref 0.6–1.1)
EGFR: >90 mL/min/{1.73_m2} (ref >90)
Glucose: 90 mg/dl (ref 70–140)
Potassium: 4.1 mEq/L (ref 3.5–5.1)
Sodium: 140 mEq/L (ref 136–145)
TOTAL PROTEIN: 7.5 g/dL (ref 6.4–8.3)

## 2015-05-29 LAB — LACTATE DEHYDROGENASE (CC13): LDH: 182 U/L (ref 125–245)

## 2015-05-29 LAB — TSH CHCC: TSH: 0.724 m[IU]/L (ref 0.308–3.960)

## 2015-05-29 LAB — MAGNESIUM (CC13): Magnesium: 2 mg/dl (ref 1.5–2.5)

## 2015-05-29 LAB — PHOSPHORUS: PHOSPHORUS: 3.7 mg/dL (ref 2.5–4.5)

## 2015-05-29 LAB — RESEARCH LABS

## 2015-05-29 MED ORDER — HEPARIN SOD (PORK) LOCK FLUSH 100 UNIT/ML IV SOLN
500.0000 [IU] | Freq: Once | INTRAVENOUS | Status: AC | PRN
  Administered 2015-05-29: 500 [IU]
  Filled 2015-05-29: qty 5

## 2015-05-29 MED ORDER — SODIUM CHLORIDE 0.9 % IV SOLN
1200.0000 mg | Freq: Once | INTRAVENOUS | Status: AC
  Administered 2015-05-29: 1200 mg via INTRAVENOUS
  Filled 2015-05-29: qty 20

## 2015-05-29 MED ORDER — SODIUM CHLORIDE 0.9 % IJ SOLN
10.0000 mL | INTRAMUSCULAR | Status: DC | PRN
  Administered 2015-05-29: 10 mL
  Filled 2015-05-29: qty 10

## 2015-05-29 MED ORDER — SODIUM CHLORIDE 0.9 % IV SOLN
Freq: Once | INTRAVENOUS | Status: AC
  Administered 2015-05-29: 13:00:00 via INTRAVENOUS

## 2015-05-29 NOTE — Patient Instructions (Signed)
[facility] Discharge Instructions for Patients Receiving Chemotherapy  Today you received the following chemotherapy agents Atezolizumab.  To help prevent nausea and vomiting after your treatment, we encourage you to take your nausea medication as prescribed.   If you develop nausea and vomiting that is not controlled by your nausea medication, call the clinic.   BELOW ARE SYMPTOMS THAT SHOULD BE REPORTED IMMEDIATELY:  *FEVER GREATER THAN 100.5 F  *CHILLS WITH OR WITHOUT FEVER  NAUSEA AND VOMITING THAT IS NOT CONTROLLED WITH YOUR NAUSEA MEDICATION  *UNUSUAL SHORTNESS OF BREATH  *UNUSUAL BRUISING OR BLEEDING  TENDERNESS IN MOUTH AND THROAT WITH OR WITHOUT PRESENCE OF ULCERS  *URINARY PROBLEMS  *BOWEL PROBLEMS  UNUSUAL RASH Items with * indicate a potential emergency and should be followed up as soon as possible.  Feel free to call the clinic you have any questions or concerns. The clinic phone number is [phone].  Please show the [facility] at check-in to the Emergency Department and triage nurse.

## 2015-05-29 NOTE — Progress Notes (Signed)
BMS 370: F/U visit 1 and EOT:  Patient here today for start of new treatment on Tecentriq. Patient saw Dr. Julien Nordmann last week 10/25 for scheduled appointment for Cycle 7 on study for BMS370. MD reviewed with patient her CT results and she was informed of progression, patient had EOT visit in place of scheduled Cycle 7. I met with patient today after she completed study required EOT/Follow-up visit 1 questionnaires with research assistant [name] and lab draws, including EOT labs. Patient was unable to stay last week, after being informed of progression, to complete study required EOT labs and questionnaires, patient completed them today. I met with patient in treatment room to assess for any new A/E's patient may have. Patient denies any new symptoms and denies any new concomitant medications. Patient denies any pain and confirms continues with fatigue (Grade 1).  Patient reports she was informed to stop decadron and confirms she had stopped 10/5, after her last chemo therapy treatment. Patient expressed being nervous today d/t start of new therapy, I sat with patient and offered encouragement and support, patient expressed thanks. I encouraged patient to contact Dr. Julien Nordmann should she have any questions regarding study and new treatment of Tecentriq. Patient knows she can call me as well with any questions and/or concerns. All patient's questions answered to her satisfaction, she denies further questions at this time.  Follow up visit #2 of study approximately mid January. Adele Dan, RN, BSN Clinical Research 05/29/2015 2:54 PM

## 2015-05-30 ENCOUNTER — Telehealth: Payer: Self-pay | Admitting: Medical Oncology

## 2015-05-30 ENCOUNTER — Encounter: Payer: Self-pay | Admitting: Internal Medicine

## 2015-05-30 NOTE — Telephone Encounter (Signed)
Tolerated tx well just tired. I told her that fatigue is a common side effect immunotherapy. I instructed her to call for rash , vision changes, Gi issues or any other  unusual symptoms.

## 2015-05-30 NOTE — Progress Notes (Signed)
I placed alimta asst form on desk of nurse for dr. Mckinley Jewel

## 2015-05-31 ENCOUNTER — Encounter: Payer: Self-pay | Admitting: Internal Medicine

## 2015-05-31 NOTE — Progress Notes (Signed)
I placed [employer] application for poss asst with tecentriq on desk of nurse

## 2015-06-01 ENCOUNTER — Encounter: Payer: Self-pay | Admitting: Internal Medicine

## 2015-06-01 NOTE — Progress Notes (Signed)
Per [employer], they patient has medicaid and is under spenddown. They have referred her to [facility] in regards to Tecentriq.  See prev ntoes.

## 2015-06-04 ENCOUNTER — Other Ambulatory Visit: Payer: Self-pay | Admitting: Internal Medicine

## 2015-06-11 ENCOUNTER — Telehealth: Payer: Self-pay | Admitting: *Deleted

## 2015-06-11 NOTE — Telephone Encounter (Signed)
error 

## 2015-06-13 ENCOUNTER — Encounter: Payer: Self-pay | Admitting: Pulmonary Disease

## 2015-06-13 ENCOUNTER — Ambulatory Visit (INDEPENDENT_AMBULATORY_CARE_PROVIDER_SITE_OTHER): Payer: Self-pay | Admitting: Pulmonary Disease

## 2015-06-13 ENCOUNTER — Ambulatory Visit (INDEPENDENT_AMBULATORY_CARE_PROVIDER_SITE_OTHER)
Admission: RE | Admit: 2015-06-13 | Discharge: 2015-06-13 | Disposition: A | Discharge status: Home / Self Care | Payer: Self-pay | Source: Ambulatory Visit | Attending: Pulmonary Disease | Admitting: Pulmonary Disease

## 2015-06-13 VITALS — BP 140/84 | HR 109 | Ht 64.0 in | Wt 197.4 lb

## 2015-06-13 DIAGNOSIS — J441 Chronic obstructive pulmonary disease with (acute) exacerbation: Secondary | ICD-10-CM

## 2015-06-13 DIAGNOSIS — C3490 Malignant neoplasm of unspecified part of unspecified bronchus or lung: Secondary | ICD-10-CM

## 2015-06-13 MED ORDER — FLUTICASONE FUROATE-VILANTEROL 100-25 MCG/INH IN AEPB: 1.0000 | INHALATION_SPRAY | Freq: Every day | RESPIRATORY_TRACT | Status: DC

## 2015-06-13 MED ORDER — PREDNISONE 10 MG PO TABS: ORAL_TABLET | ORAL | Status: DC

## 2015-06-13 MED ORDER — ALBUTEROL SULFATE (2.5 MG/3ML) 0.083% IN NEBU: 2.5000 mg | INHALATION_SOLUTION | Freq: Four times a day (QID) | RESPIRATORY_TRACT | Status: DC | PRN

## 2015-06-13 NOTE — Patient Instructions (Signed)
Refills on Breo & albuterol MDI Use albuterol nebs as needed only Prednisone 10 mg tabs  Take 2 tabs daily with food x 5ds, then 1 tab daily with food x 5ds then STOP CXR today Call as needed - hoping that immunotherapy will work

## 2015-06-13 NOTE — Assessment & Plan Note (Addendum)
Refills on Breo & albuterol MDI Use albuterol nebs as needed only Would give her a short course of prednisone Prednisone 10 mg tabs  Take 2 tabs daily with food x 5ds, then 1 tab daily with food x 5ds then STOP

## 2015-06-13 NOTE — Progress Notes (Signed)
   Subjective:    Patient ID: Heather Banks, female    DOB: January 10, 1951, 64 y.o.   MRN: [id number]  HPI  64 yo smoker with COPD and adenocarcinoma lung, stage IV.  Admitted 06/1314 for COPD exacerbation.  CT showed bilateral upper lobe lung mass w/ minmally enlarged hilar and mediastinal nodes, hypermetabolic on PET She was discharged on oxygen at 3 L. PFT showed an FEV1 at 46%, ratio of 59. Diffusing capacity was decreased at 56% , FVC was 60%  Underwent ENB guided biopsy ,  LUL bx was neg, RUL-2 targets showed adenoCA- felt to be synchronous tumors  06/13/2015  Chief Complaint  Patient presents with  . Follow-up    Coughing a lot. Had a cold x 1 month ago, she is not sure if is related to the chemo treatments or if she has infection  wants rescue inhaler to carry with her   Had disease progression on chemoRx x 6 cycles Started on immunotherapy q 3 wks Feels tired all the time Smokes 2-4 cigs/d  She complains of coughing, minimal sputum production and occasional wheezing No emergency room or hospitalizations since last visit. Denies chest pain, orthopnea, edema or fever.   Most recent CT chest 04/2015 showed a slight increase in the size of lesions compared to 02/2015    CXR - confirms and does not show any other infiltrates  Review of Systems neg for any significant sore throat, dysphagia, itching, sneezing, nasal congestion or excess/ purulent secretions, fever, chills, sweats, unintended wt loss, pleuritic or exertional cp, hempoptysis, orthopnea pnd or change in chronic leg swelling. Also denies presyncope, palpitations, heartburn, abdominal pain, nausea, vomiting, diarrhea or change in bowel or urinary habits, dysuria,hematuria, rash, arthralgias, visual complaints, headache, numbness weakness or ataxia.     Objective:   Physical Exam  Gen. Pleasant, well-nourished, in no distress ENT - no lesions, no post nasal drip Neck: No JVD, no thyromegaly, no carotid  bruits Lungs: no use of accessory muscles, no dullness to percussion, clear without rales or rhonchi  Cardiovascular: Rhythm regular, heart sounds  normal, no murmurs or gallops, no peripheral edema Musculoskeletal: No deformities, no cyanosis or clubbing        Assessment & Plan:

## 2015-06-13 NOTE — Assessment & Plan Note (Addendum)
-   hoping that immunotherapy will work She has excessive daytime fatigue likely related to the medication

## 2015-06-14 NOTE — Progress Notes (Signed)
Quick Note:  ATC x2 line rang busy signal  wcb ______

## 2015-06-15 ENCOUNTER — Telehealth: Payer: Self-pay | Admitting: Pulmonary Disease

## 2015-06-15 MED ORDER — ALBUTEROL SULFATE HFA 108 (90 BASE) MCG/ACT IN AERS: 2.0000 | INHALATION_SPRAY | Freq: Four times a day (QID) | RESPIRATORY_TRACT | Status: DC | PRN

## 2015-06-15 NOTE — Telephone Encounter (Signed)
Per CXR results: Notes Recorded by Rigoberto Noel, MD on 06/13/2015 at 5:46 PM No evidence of pneumonia, prior lung masses noted ---  I spoke with patient about results and she verbalized understanding and had no questions

## 2015-06-18 ENCOUNTER — Ambulatory Visit: Payer: Self-pay | Admitting: Internal Medicine

## 2015-06-18 ENCOUNTER — Other Ambulatory Visit: Payer: Self-pay

## 2015-06-19 ENCOUNTER — Encounter: Payer: Self-pay | Admitting: *Deleted

## 2015-06-19 ENCOUNTER — Encounter: Payer: Self-pay | Admitting: Internal Medicine

## 2015-06-19 ENCOUNTER — Ambulatory Visit (HOSPITAL_BASED_OUTPATIENT_CLINIC_OR_DEPARTMENT_OTHER): Payer: Self-pay | Admitting: Internal Medicine

## 2015-06-19 ENCOUNTER — Ambulatory Visit (HOSPITAL_BASED_OUTPATIENT_CLINIC_OR_DEPARTMENT_OTHER): Payer: Self-pay

## 2015-06-19 ENCOUNTER — Telehealth: Payer: Self-pay | Admitting: Internal Medicine

## 2015-06-19 ENCOUNTER — Other Ambulatory Visit (HOSPITAL_BASED_OUTPATIENT_CLINIC_OR_DEPARTMENT_OTHER): Payer: Self-pay

## 2015-06-19 ENCOUNTER — Other Ambulatory Visit: Payer: Self-pay | Admitting: *Deleted

## 2015-06-19 VITALS — BP 129/67 | HR 79 | Temp 98.2°F | Resp 18 | Ht 64.0 in | Wt 194.0 lb

## 2015-06-19 DIAGNOSIS — Z7189 Other specified counseling: Secondary | ICD-10-CM | POA: Insufficient documentation

## 2015-06-19 DIAGNOSIS — C3490 Malignant neoplasm of unspecified part of unspecified bronchus or lung: Secondary | ICD-10-CM

## 2015-06-19 DIAGNOSIS — Z5112 Encounter for antineoplastic immunotherapy: Secondary | ICD-10-CM

## 2015-06-19 DIAGNOSIS — Z79899 Other long term (current) drug therapy: Secondary | ICD-10-CM

## 2015-06-19 DIAGNOSIS — R5382 Chronic fatigue, unspecified: Secondary | ICD-10-CM

## 2015-06-19 LAB — COMPREHENSIVE METABOLIC PANEL (CC13)
ALBUMIN: 3.6 g/dL (ref 3.5–5.0)
ALK PHOS: 143 U/L (ref 40–150)
ALT: 13 U/L (ref 0–55)
ANION GAP: 13 meq/L — AB (ref 3–11)
AST: 17 U/L (ref 5–34)
BUN: 12.1 mg/dL (ref 7.0–26.0)
CALCIUM: 9.5 mg/dL (ref 8.4–10.4)
CO2: 23 mEq/L (ref 22–29)
CREATININE: 0.8 mg/dL (ref 0.6–1.1)
Chloride: 106 mEq/L (ref 98–109)
EGFR: 80 mL/min/{1.73_m2} — ABNORMAL LOW (ref >90)
Glucose: 93 mg/dl (ref 70–140)
POTASSIUM: 3.6 meq/L (ref 3.5–5.1)
Sodium: 143 mEq/L (ref 136–145)
Total Bilirubin: <0.3 mg/dL (ref 0.20–1.20)
Total Protein: 7.4 g/dL (ref 6.4–8.3)

## 2015-06-19 LAB — CBC WITH DIFFERENTIAL/PLATELET
BASO%: 0.3 % (ref 0.0–2.0)
BASOS ABS: 0 10*3/uL (ref 0.0–0.1)
EOS ABS: 0.3 10*3/uL (ref 0.0–0.5)
EOS%: 3.1 % (ref 0.0–7.0)
HEMATOCRIT: 41.3 % (ref 34.8–46.6)
HEMOGLOBIN: 13.4 g/dL (ref 11.6–15.9)
LYMPH#: 3.2 10*3/uL (ref 0.9–3.3)
LYMPH%: 33.6 % (ref 14.0–49.7)
MCH: 30.1 pg (ref 25.1–34.0)
MCHC: 32.4 g/dL (ref 31.5–36.0)
MCV: 92.8 fL (ref 79.5–101.0)
MONO#: 0.9 10*3/uL (ref 0.1–0.9)
MONO%: 9.5 % (ref 0.0–14.0)
NEUT#: 5.1 10*3/uL (ref 1.5–6.5)
NEUT%: 53.5 % (ref 38.4–76.8)
PLATELETS: 326 10*3/uL (ref 145–400)
RBC: 4.45 10*6/uL (ref 3.70–5.45)
RDW: 17.7 % — AB (ref 11.2–14.5)
WBC: 9.6 10*3/uL (ref 3.9–10.3)

## 2015-06-19 LAB — TSH CHCC: TSH: 0.733 m(IU)/L (ref 0.308–3.960)

## 2015-06-19 LAB — TECHNOLOGIST REVIEW

## 2015-06-19 MED ORDER — SODIUM CHLORIDE 0.9 % IJ SOLN
10.0000 mL | INTRAMUSCULAR | Status: DC | PRN
  Administered 2015-06-19: 10 mL
  Filled 2015-06-19: qty 10

## 2015-06-19 MED ORDER — HEPARIN SOD (PORK) LOCK FLUSH 100 UNIT/ML IV SOLN
500.0000 [IU] | Freq: Once | INTRAVENOUS | Status: AC | PRN
  Administered 2015-06-19: 500 [IU]
  Filled 2015-06-19: qty 5

## 2015-06-19 MED ORDER — SODIUM CHLORIDE 0.9 % IV SOLN
1200.0000 mg | Freq: Once | INTRAVENOUS | Status: AC
  Administered 2015-06-19: 1200 mg via INTRAVENOUS
  Filled 2015-06-19: qty 20

## 2015-06-19 MED ORDER — SODIUM CHLORIDE 0.9 % IV SOLN
Freq: Once | INTRAVENOUS | Status: AC
  Administered 2015-06-19: 13:00:00 via INTRAVENOUS

## 2015-06-19 NOTE — Progress Notes (Signed)
Oncology Nurse Navigator Documentation  Oncology Nurse Navigator Flowsheets 06/19/2015  Navigator Encounter Type Clinic/MDC/spoke with patient today.  She had questions about her medication.  I gave and explained Tecentriq.  She was thankful for the information.   Patient Visit Type Follow-up  Treatment Phase Treatment  Barriers/Navigation Needs Education  Education Other  Interventions Education Method  Education Method Verbal;Written  Time Spent with Patient 15

## 2015-06-19 NOTE — Telephone Encounter (Signed)
Gave and printed aptp sched and avs fo rpt for DEc and Jan...the patient has another appt on 12.13 tx moved to 12.14

## 2015-06-19 NOTE — Progress Notes (Signed)
[place] [phone]   [phone]  OFFICE PROGRESS NOTE  Lanier Clam, MD [address] [place] [address]  DIAGNOSIS: Stage IV (T2a, N0, M1a) non-small cell lung cancer, adenocarcinoma presented with bilateral pulmonary lesions. This could be also consider as synchronous primary tumors.  PRIOR THERAPY:  1) Systemic chemotherapy with carboplatin for AUC of 5 and Alimta 500 MG/M2 every 3 weeks. Status post 6 cycles. 2) Treatment according to the BMS checkmate 370 clinical trial. Randomized to group A arm B1 treatment with pemetrexed (Alimta) 500 mg/m given every 3 weeks. Status post 6 cycles discontinued secondary to disease progression.   CURRENT THERAPY: Tecentriq 1200 mg IV every 3 weeks status post 1 cycle. INTERVAL HISTORY: Heather Banks 64 y.o. female returns to the clinic today for follow-up visit. The patient was recently started on treatment with immunotherapy with Tecentriq (Atezolizumab) and tolerated the first cycle of her treatment fairly well. She continues to have some dizzy spells and tendency to fall. She denied having any significant weight loss or night sweats. She denied having any significant nausea or vomiting. She has no fever or chills. The patient denied having any significant chest pain, shortness of breath, was no cough or hemoptysis.  She is here today to start cycle #2 of her immunotherapy.  MEDICAL HISTORY: Past Medical History  Diagnosis Date  . COPD (chronic obstructive pulmonary disease) (HCC)     Albuterol neb as needed;Pulmicort neb daily  . GERD (gastroesophageal reflux disease)     takes Pantoprazole and Zantac daily  . Lung mass   . Pneumonia     hx of-last time about 4+yrs ago  . History of bronchitis     >5 yrs ago  . History of migraine     last one 2 wks ago  . Panic attacks     but doesn't take any meds  . Encounter for antineoplastic chemotherapy 01/21/2015  . Full code status  05/01/2015    ALLERGIES:  has No Known Allergies.  MEDICATIONS:  Current Outpatient Prescriptions  Medication Sig Dispense Refill  . albuterol (PROVENTIL HFA;VENTOLIN HFA) 108 (90 BASE) MCG/ACT inhaler Inhale 2 puffs into the lungs every 6 (six) hours as needed for wheezing or shortness of breath. 1 Inhaler 6  . albuterol (PROVENTIL) (2.5 MG/3ML) 0.083% nebulizer solution Take 3 mLs (2.5 mg total) by nebulization every 6 (six) hours as needed for wheezing or shortness of breath. 75 mL 3  . ALPRAZolam (XANAX) 0.5 MG tablet Take 1 tablet (0.5 mg total) by mouth at bedtime as needed for anxiety. 30 tablet 0  . aspirin-acetaminophen-caffeine (EXCEDRIN MIGRAINE) [phone] MG per tablet Take 2 tablets by mouth every 6 (six) hours as needed for headache.    . dexamethasone (DECADRON) 4 MG tablet 4 mg by mouth twice a day the day before, day of and day after the chemotherapy every 3 weeks 40 tablet 1  . Fluticasone Furoate-Vilanterol 100-25 MCG/INH AEPB Inhale 1 puff into the lungs daily. 1 each 3  . lidocaine-prilocaine (EMLA) cream Apply 1 application topically as needed. (Patient taking differently: Apply 1 application topically as needed (For port-a-cath.). ) 30 g 1  . mirtazapine (REMERON) 30 MG tablet Take 1 tablet (30 mg total) by mouth at bedtime. 30 tablet 0  . Multiple Vitamins-Minerals (MULTIVITAMIN WITH MINERALS) tablet Take 1 tablet by mouth daily.    . nicotine (NICODERM CQ) 21 mg/24hr patch Place 1 patch (21 mg total) onto the skin  daily. 28 patch 0  . omeprazole (PRILOSEC) 20 MG capsule TAKE 1 CAPSULE BY MOUTH TWICE DAILY BEFORE A MEAL 60 capsule 0  . polyethylene glycol (MIRALAX / GLYCOLAX) packet Take 17 g by mouth daily. Takes 2 caps every night    . predniSONE (DELTASONE) 10 MG tablet Take '20mg'$  x 5 days, '10mg'$  x 5 days, then stop 15 tablet 0  . PRESCRIPTION MEDICATION She receives her treatments at the [facility]. Her oncologist is Dr. Julien Nordmann. She received  Alimta '925mg'$  and Carboplatin '560mg'$  on 08/30/14.    . Probiotic Product (PROBIOTIC DAILY PO) Take by mouth.    . prochlorperazine (COMPAZINE) 10 MG tablet Take 1 tablet (10 mg total) by mouth every 6 (six) hours as needed for nausea or vomiting. 30 tablet 0   No current facility-administered medications for this visit.    SURGICAL HISTORY:  Past Surgical History  Procedure Laterality Date  . Abdominal hysterectomy    . Oophorectomy    . Appendectomy    . Tonsillectomy    . Video bronchoscopy with endobronchial navigation N/A 08/14/2014    Procedure: VIDEO BRONCHOSCOPY WITH ENDOBRONCHIAL NAVIGATION;  Surgeon: Rigoberto Noel, MD;  Location: [facility];  Service: Thoracic;  Laterality: N/A;  . Portacath placement  jan. 2016    REVIEW OF SYSTEMS:  A comprehensive review of systems was negative except for: Constitutional: positive for fatigue Respiratory: positive for cough Neurological: positive for dizziness   PHYSICAL EXAMINATION: General appearance: alert, cooperative and no distress Head: Normocephalic, without obvious abnormality, atraumatic Neck: no adenopathy, no JVD, supple, symmetrical, trachea midline and thyroid not enlarged, symmetric, no tenderness/mass/nodules Lymph nodes: Cervical, supraclavicular, and axillary nodes normal. Resp: clear to auscultation bilaterally Back: symmetric, no curvature. ROM normal. No CVA tenderness. Cardio: regular rate and rhythm, S1, S2 normal, no murmur, click, rub or gallop GI: soft, non-tender; bowel sounds normal; no masses,  no organomegaly Extremities: extremities normal, atraumatic, no cyanosis or edema Neurologic: Alert and oriented X 3, normal strength and tone. Normal symmetric reflexes. Normal coordination and gait  ECOG PERFORMANCE STATUS: 1 - Symptomatic but completely ambulatory  Blood pressure 129/67, pulse 79, temperature 98.2 F (36.8 C), temperature source Oral, resp. rate 18, height '5\' 4"'$  (1.626 m), weight 194 lb (87.998 kg), SpO2  96 %.  LABORATORY DATA: Lab Results  Component Value Date   WBC 9.6 06/19/2015   HGB 13.4 06/19/2015   HCT 41.3 06/19/2015   MCV 92.8 06/19/2015   PLT 326 06/19/2015      Chemistry      Component Value Date/Time   NA 143 06/19/2015 1129   NA 138 08/11/2014 0837   K 3.6 06/19/2015 1129   K 3.8 08/11/2014 0837   CL 103 08/11/2014 0837   CO2 23 06/19/2015 1129   CO2 23 08/11/2014 0837   BUN 12.1 06/19/2015 1129   BUN 8 08/11/2014 0837   CREATININE 0.8 06/19/2015 1129   CREATININE 0.57 08/11/2014 0837      Component Value Date/Time   CALCIUM 9.5 06/19/2015 1129   CALCIUM 9.0 08/11/2014 0837   ALKPHOS 143 06/19/2015 1129   ALKPHOS 145* 07/10/2014 0459   AST 17 06/19/2015 1129   AST 11 07/10/2014 0459   ALT 13 06/19/2015 1129   ALT 8 07/10/2014 0459   BILITOT <0.30 06/19/2015 1129   BILITOT <0.2* 07/10/2014 0459       RADIOGRAPHIC STUDIES: Dg Chest 2 View  06/13/2015  CLINICAL DATA:  Cough, congestion, left upper  lobe adenocarcinoma diagnosed in January, undergoing chemotherapy EXAM: CHEST  2 VIEW COMPARISON:  CT chest of 05/15/2015 and chest x-ray of 08/21/2014, CT chest of 10/30/2014 FINDINGS: The dominant left upper lobe mass appears to have increased slightly further in size now measuring 5.2 cm compared to maximum prior dimension of 4.9 cm on the most recent CT of the chest. On CT of the chest of 10/30/2014 that lesion measured 3.9 cm. The second dominant lesion in the right upper lobe unfortunately overlies the port of the Port-A-Cath and cannot be adequately measured. The lungs remain hyperaerated consistent with emphysema. No new parenchymal infiltrate is seen and no pleural effusion is noted. Mediastinal and hilar contours are unchanged. Heart size is stable. No acute bony abnormality is seen. IMPRESSION: 1. Progressive increase in size of the dominant left upper lobe lesion. 2. The second dominant lesion in the right upper lobe is overlain by the port of the  Port-A-Cath and cannot be measured accurately. No new abnormality is seen. Electronically Signed   By: Ivar Drape M.D.   On: 06/13/2015 16:58    ASSESSMENT AND PLAN: This is a very pleasant 64 years old white female with stage IV non-small cell lung cancer currently undergoing systemic chemotherapy with carboplatin and Alimta status post 6 cycles.  She she completed a course of maintenance chemotherapy with single agent Alimta status post 6 cycles and tolerating her treatment fairly well but this was discontinued secondary to disease progression.  The patient is currently undergoing treatment with immunotherapy with Tecentriq Huey Bienenstock) status post 1 cycle. She tolerated the first cycle well. I recommended for her to proceed with cycle #2 today as a scheduled. For the dizzy spells and tendency to fall, I recommended for the patient to have repeat MRI of the brain to rule out brain metastasis. For depression, she will continue on Remeron 30 mg by mouth daily at bedtime. For the acid reflux, the patient will continue Prilosec 20 mg by mouth twice a day. The patient would come back for follow-up visit in 3 weeks for reevaluation before starting cycle #3. CODE STATUS: FULL CODE STATUS but no prolonged support if she is in a vegetative state. She was advised to call immediately if she has any concerning symptoms in the interval. The patient voices understanding of current disease status and treatment options and is in agreement with the current care plan.  All questions were answered. The patient knows to call the clinic with any problems, questions or concerns. We can certainly see the patient much sooner if necessary.  Disclaimer: This note was dictated with voice recognition software. Similar sounding words can inadvertently be transcribed and may not be corrected upon review.

## 2015-06-26 ENCOUNTER — Encounter: Payer: Self-pay | Admitting: Internal Medicine

## 2015-06-26 NOTE — Progress Notes (Signed)
Received fax from [facility] needing sighature for the patient authorization form. Called patient to advise no answer, left message to call back.

## 2015-07-03 ENCOUNTER — Ambulatory Visit (HOSPITAL_COMMUNITY)
Admission: RE | Admit: 2015-07-03 | Discharge: 2015-07-03 | Disposition: A | Discharge status: Home / Self Care | Payer: Self-pay | Source: Ambulatory Visit | Attending: Internal Medicine | Admitting: Internal Medicine

## 2015-07-03 DIAGNOSIS — C3490 Malignant neoplasm of unspecified part of unspecified bronchus or lung: Secondary | ICD-10-CM | POA: Insufficient documentation

## 2015-07-03 MED ORDER — GADOBENATE DIMEGLUMINE 529 MG/ML IV SOLN
20.0000 mL | Freq: Once | INTRAVENOUS | Status: AC | PRN
  Administered 2015-07-03: 18 mL via INTRAVENOUS

## 2015-07-05 ENCOUNTER — Encounter: Payer: Self-pay | Admitting: Internal Medicine

## 2015-07-05 NOTE — Progress Notes (Signed)
Faxed the patient Heather Banks and notice of release of information forms to [facility]) [phone]  on 07/04/15 for assistance with Tecentriq. Forward copy to medical records.

## 2015-07-11 ENCOUNTER — Other Ambulatory Visit: Payer: Self-pay | Admitting: Physician Assistant

## 2015-07-11 ENCOUNTER — Ambulatory Visit (HOSPITAL_BASED_OUTPATIENT_CLINIC_OR_DEPARTMENT_OTHER): Payer: Self-pay

## 2015-07-11 ENCOUNTER — Ambulatory Visit (HOSPITAL_BASED_OUTPATIENT_CLINIC_OR_DEPARTMENT_OTHER): Payer: Self-pay | Admitting: Physician Assistant

## 2015-07-11 ENCOUNTER — Telehealth: Payer: Self-pay | Admitting: Internal Medicine

## 2015-07-11 ENCOUNTER — Other Ambulatory Visit (HOSPITAL_BASED_OUTPATIENT_CLINIC_OR_DEPARTMENT_OTHER): Payer: Self-pay

## 2015-07-11 VITALS — BP 127/71 | HR 73 | Temp 97.7°F | Resp 18 | Ht 64.0 in | Wt 199.3 lb

## 2015-07-11 DIAGNOSIS — C3490 Malignant neoplasm of unspecified part of unspecified bronchus or lung: Secondary | ICD-10-CM

## 2015-07-11 DIAGNOSIS — C3491 Malignant neoplasm of unspecified part of right bronchus or lung: Secondary | ICD-10-CM

## 2015-07-11 DIAGNOSIS — C3411 Malignant neoplasm of upper lobe, right bronchus or lung: Secondary | ICD-10-CM

## 2015-07-11 DIAGNOSIS — R5382 Chronic fatigue, unspecified: Secondary | ICD-10-CM

## 2015-07-11 DIAGNOSIS — R21 Rash and other nonspecific skin eruption: Secondary | ICD-10-CM

## 2015-07-11 DIAGNOSIS — C3412 Malignant neoplasm of upper lobe, left bronchus or lung: Secondary | ICD-10-CM

## 2015-07-11 DIAGNOSIS — Z79899 Other long term (current) drug therapy: Secondary | ICD-10-CM

## 2015-07-11 DIAGNOSIS — C3492 Malignant neoplasm of unspecified part of left bronchus or lung: Secondary | ICD-10-CM

## 2015-07-11 DIAGNOSIS — T451X5A Adverse effect of antineoplastic and immunosuppressive drugs, initial encounter: Secondary | ICD-10-CM

## 2015-07-11 DIAGNOSIS — Z5112 Encounter for antineoplastic immunotherapy: Secondary | ICD-10-CM

## 2015-07-11 DIAGNOSIS — D6481 Anemia due to antineoplastic chemotherapy: Secondary | ICD-10-CM

## 2015-07-11 LAB — COMPREHENSIVE METABOLIC PANEL
ALT: 18 U/L (ref 0–55)
ANION GAP: 11 meq/L (ref 3–11)
AST: 32 U/L (ref 5–34)
Albumin: 3.6 g/dL (ref 3.5–5.0)
Alkaline Phosphatase: 154 U/L — ABNORMAL HIGH (ref 40–150)
BUN: 9.7 mg/dL (ref 7.0–26.0)
CALCIUM: 9.9 mg/dL (ref 8.4–10.4)
CHLORIDE: 106 meq/L (ref 98–109)
CO2: 25 meq/L (ref 22–29)
CREATININE: 0.8 mg/dL (ref 0.6–1.1)
EGFR: 83 mL/min/{1.73_m2} — AB (ref >90)
Glucose: 104 mg/dl (ref 70–140)
POTASSIUM: 4.5 meq/L (ref 3.5–5.1)
Sodium: 142 mEq/L (ref 136–145)
Total Bilirubin: <0.3 mg/dL (ref 0.20–1.20)
Total Protein: 7.7 g/dL (ref 6.4–8.3)

## 2015-07-11 LAB — CBC WITH DIFFERENTIAL/PLATELET
BASO%: 1 % (ref 0.0–2.0)
Basophils Absolute: 0.1 10*3/uL (ref 0.0–0.1)
EOS%: 6.2 % (ref 0.0–7.0)
Eosinophils Absolute: 0.3 10*3/uL (ref 0.0–0.5)
HCT: 40.4 % (ref 34.8–46.6)
HGB: 12.6 g/dL (ref 11.6–15.9)
LYMPH%: 33.1 % (ref 14.0–49.7)
MCH: 29.7 pg (ref 25.1–34.0)
MCHC: 31.2 g/dL — AB (ref 31.5–36.0)
MCV: 95.3 fL (ref 79.5–101.0)
MONO#: 0.5 10*3/uL (ref 0.1–0.9)
MONO%: 9.6 % (ref 0.0–14.0)
NEUT#: 2.6 10*3/uL (ref 1.5–6.5)
NEUT%: 50.1 % (ref 38.4–76.8)
PLATELETS: 326 10*3/uL (ref 145–400)
RBC: 4.24 10*6/uL (ref 3.70–5.45)
RDW: 17.6 % — ABNORMAL HIGH (ref 11.2–14.5)
WBC: 5.1 10*3/uL (ref 3.9–10.3)
lymph#: 1.7 10*3/uL (ref 0.9–3.3)

## 2015-07-11 LAB — TSH: TSH: 1.181 m[IU]/L (ref 0.308–3.960)

## 2015-07-11 MED ORDER — ATEZOLIZUMAB CHEMO INJECTION 1200 MG/20ML
1200.0000 mg | Freq: Once | INTRAVENOUS | Status: AC
  Administered 2015-07-11: 1200 mg via INTRAVENOUS
  Filled 2015-07-11: qty 20

## 2015-07-11 MED ORDER — SODIUM CHLORIDE 0.9 % IV SOLN
Freq: Once | INTRAVENOUS | Status: AC
  Administered 2015-07-11: 10:00:00 via INTRAVENOUS

## 2015-07-11 MED ORDER — SODIUM CHLORIDE 0.9 % IJ SOLN
10.0000 mL | INTRAMUSCULAR | Status: DC | PRN
  Administered 2015-07-11: 10 mL
  Filled 2015-07-11: qty 10

## 2015-07-11 MED ORDER — HEPARIN SOD (PORK) LOCK FLUSH 100 UNIT/ML IV SOLN
500.0000 [IU] | Freq: Once | INTRAVENOUS | Status: AC | PRN
  Administered 2015-07-11: 500 [IU]
  Filled 2015-07-11: qty 5

## 2015-07-11 NOTE — Patient Instructions (Signed)
[facility] Discharge Instructions for Patients Receiving Chemotherapy  Today you received the following chemotherapy agents: Tecentriq  To help prevent nausea and vomiting after your treatment, we encourage you to take your nausea medication as directed.    If you develop nausea and vomiting that is not controlled by your nausea medication, call the clinic.   BELOW ARE SYMPTOMS THAT SHOULD BE REPORTED IMMEDIATELY:  *FEVER GREATER THAN 100.5 F  *CHILLS WITH OR WITHOUT FEVER  NAUSEA AND VOMITING THAT IS NOT CONTROLLED WITH YOUR NAUSEA MEDICATION  *UNUSUAL SHORTNESS OF BREATH  *UNUSUAL BRUISING OR BLEEDING  TENDERNESS IN MOUTH AND THROAT WITH OR WITHOUT PRESENCE OF ULCERS  *URINARY PROBLEMS  *BOWEL PROBLEMS  UNUSUAL RASH Items with * indicate a potential emergency and should be followed up as soon as possible.  Feel free to call the clinic you have any questions or concerns. The clinic phone number is [phone].  Please show the CHEMO ALERT CARD at check-in to the Emergency Department and triage nurse.   

## 2015-07-11 NOTE — Telephone Encounter (Signed)
Gave and printed appt sched and avs

## 2015-07-11 NOTE — Progress Notes (Signed)
[place] [phone]   Fax:([phone]  OFFICE PROGRESS NOTE  Lanier Clam, MD [address] [place] [address]  DIAGNOSIS: Stage IV (T2a, N0, M1a) non-small cell lung cancer, adenocarcinoma presented with bilateral pulmonary lesions. This could be also consider as synchronous primary tumors.  PRIOR THERAPY:  1) Systemic chemotherapy with carboplatin for AUC of 5 and Alimta 500 MG/M2 every 3 weeks. Status post 6 cycles. 2) Treatment according to the BMS checkmate 370 clinical trial. Randomized to group A arm B1 treatment with pemetrexed (Alimta) 500 mg/m given every 3 weeks. Status post 6 cycles discontinued secondary to disease progression.   CURRENT THERAPY: Tecentriq 1200 mg IV every 3 weeks status post 2 cycles. INTERVAL HISTORY: Alphonsine Minium 64 y.o. female returns to the clinic today for follow-up visit. The patient was recently started on treatment with immunotherapy with Tecentriq (Atezolizumab) and tolerated the second cycle of her treatment fairly well. She continues to have some dizzy spells and tendency to fall. She denied having any significant weight loss or night sweats. She denied having any significant nausea or vomiting. She has no fever or chills. The patient denied having any significant chest pain, shortness of breath, was no cough or hemoptysis. She reports bilateral rash-like lesions in legs, arms and trunk having appeared about 1 week after last cycle, ameliorated with hydrocortisone.   She is here today to start cycle #3 of her immunotherapy.  MEDICAL HISTORY: Past Medical History  Diagnosis Date  . COPD (chronic obstructive pulmonary disease) (HCC)     Albuterol neb as needed;Pulmicort neb daily  . GERD (gastroesophageal reflux disease)     takes Pantoprazole and Zantac daily  . Lung mass   . Pneumonia     hx of-last time about 4+yrs ago  . History of bronchitis     >5 yrs ago  . History of migraine     last one 2 wks ago  . Panic attacks     but doesn't take any meds  . Encounter for antineoplastic chemotherapy 01/21/2015  . Full code status 05/01/2015    ALLERGIES:  has No Known Allergies.  MEDICATIONS:  Current Outpatient Prescriptions  Medication Sig Dispense Refill  . albuterol (PROVENTIL HFA;VENTOLIN HFA) 108 (90 BASE) MCG/ACT inhaler Inhale 2 puffs into the lungs every 6 (six) hours as needed for wheezing or shortness of breath. 1 Inhaler 6  . albuterol (PROVENTIL) (2.5 MG/3ML) 0.083% nebulizer solution Take 3 mLs (2.5 mg total) by nebulization every 6 (six) hours as needed for wheezing or shortness of breath. 75 mL 3  . ALPRAZolam (XANAX) 0.5 MG tablet Take 1 tablet (0.5 mg total) by mouth at bedtime as needed for anxiety. 30 tablet 0  . aspirin-acetaminophen-caffeine (EXCEDRIN MIGRAINE) [id number] MG per tablet Take 2 tablets by mouth every 6 (six) hours as needed for headache.    . Fluticasone Furoate-Vilanterol 100-25 MCG/INH AEPB Inhale 1 puff into the lungs daily. 1 each 3  . lidocaine-prilocaine (EMLA) cream Apply 1 application topically as needed. (Patient taking differently: Apply 1 application topically as needed (For port-a-cath.). ) 30 g 1  . mirtazapine (REMERON) 30 MG tablet Take 1 tablet (30 mg total) by mouth at bedtime. 30 tablet 0  . Multiple Vitamins-Minerals (MULTIVITAMIN WITH MINERALS) tablet Take 1 tablet by mouth daily.    . nicotine (NICODERM CQ) 21 mg/24hr patch Place 1 patch (21 mg total) onto the skin daily. 28 patch 0  . omeprazole (PRILOSEC) 20  MG capsule TAKE 1 CAPSULE BY MOUTH TWICE DAILY BEFORE A MEAL 60 capsule 0  . polyethylene glycol (MIRALAX / GLYCOLAX) packet Take 17 g by mouth daily. Takes 2 caps every night    . predniSONE (DELTASONE) 10 MG tablet Take '20mg'$  x 5 days, '10mg'$  x 5 days, then stop 15 tablet 0  . PRESCRIPTION MEDICATION She receives her treatments at the [facility]. Her oncologist is Dr. Julien Nordmann. She received  Alimta '925mg'$  and Carboplatin '560mg'$  on 08/30/14.    . Probiotic Product (PROBIOTIC DAILY PO) Take by mouth.    . prochlorperazine (COMPAZINE) 10 MG tablet Take 1 tablet (10 mg total) by mouth every 6 (six) hours as needed for nausea or vomiting. 30 tablet 0   No current facility-administered medications for this visit.    SURGICAL HISTORY:  Past Surgical History  Procedure Laterality Date  . Abdominal hysterectomy    . Oophorectomy    . Appendectomy    . Tonsillectomy    . Video bronchoscopy with endobronchial navigation N/A 08/14/2014    Procedure: VIDEO BRONCHOSCOPY WITH ENDOBRONCHIAL NAVIGATION;  Surgeon: Rigoberto Noel, MD;  Location: [facility];  Service: Thoracic;  Laterality: N/A;  . Portacath placement  jan. 2016    REVIEW OF SYSTEMS:  A comprehensive review of systems was negative except for: Constitutional: positive for fatigue Respiratory: positive for cough Neurological: positive for dizziness   PHYSICAL EXAMINATION: General appearance: alert, cooperative and no distress Head: Normocephalic, without obvious abnormality, atraumatic Neck: no adenopathy, no JVD, supple, symmetrical, trachea midline and thyroid not enlarged, symmetric, no tenderness/mass/nodules Lymph nodes: Cervical, supraclavicular, and axillary nodes normal. Resp: clear to auscultation bilaterally Back: symmetric, no curvature. ROM normal. No CVA tenderness. Cardio: regular rate and rhythm, S1, S2 normal, no murmur, click, rub or gallop GI: soft, non-tender; bowel sounds normal; no masses,  no organomegaly Extremities: extremities atraumatic, no cyanosis or edema. Bilateral rashes on both lower extremities, arms and trunk in different stages Neurologic: Alert and oriented X 3, normal strength and tone. Normal symmetric reflexes. Normal coordination and gait  ECOG PERFORMANCE STATUS: 1 - Symptomatic but completely ambulatory  Blood pressure 127/71, pulse 73, temperature 97.7 F (36.5 C), temperature source Oral,  resp. rate 18, height '5\' 4"'$  (1.626 m), weight 199 lb 4.8 oz (90.402 kg), SpO2 99 %.  LABORATORY DATA: CBC Latest Ref Rng 07/11/2015 06/19/2015 05/29/2015  WBC 3.9 - 10.3 10e3/uL 5.1 9.6 8.3  Hemoglobin 11.6 - 15.9 g/dL 12.6 13.4 13.7  Hematocrit 34.8 - 46.6 % 40.4 41.3 42.2  Platelets 145 - 400 10e3/uL 326 326 337   CMP Latest Ref Rng 06/19/2015 05/29/2015 05/22/2015  Glucose 70 - 140 mg/dl 93 90 102  BUN 7.0 - 26.0 mg/dL 12.1 8.9 8.0  Creatinine 0.6 - 1.1 mg/dL 0.8 0.7 0.7  Sodium 136 - 145 mEq/L 143 140 140  Potassium 3.5 - 5.1 mEq/L 3.6 4.1 3.4(L)  Chloride 96 - 112 mEq/L - - -  CO2 22 - 29 mEq/L '23 23 25  '$ Calcium 8.4 - 10.4 mg/dL 9.5 9.8 10.4  Total Protein 6.4 - 8.3 g/dL 7.4 7.5 7.7  Total Bilirubin 0.20 - 1.20 mg/dL <0.30 <0.30 <0.30  Alkaline Phos 40 - 150 U/L 143 143 158(H)  AST 5 - 34 U/L '17 17 27  '$ ALT 0 - 55 U/L '13 15 28     '$ RADIOGRAPHIC STUDIES: Dg Chest 2 View  06/13/2015  CLINICAL DATA:  Cough, congestion, left upper lobe adenocarcinoma diagnosed in January,  undergoing chemotherapy EXAM: CHEST  2 VIEW COMPARISON:  CT chest of 05/15/2015 and chest x-ray of 08/21/2014, CT chest of 10/30/2014 FINDINGS: The dominant left upper lobe mass appears to have increased slightly further in size now measuring 5.2 cm compared to maximum prior dimension of 4.9 cm on the most recent CT of the chest. On CT of the chest of 10/30/2014 that lesion measured 3.9 cm. The second dominant lesion in the right upper lobe unfortunately overlies the port of the Port-A-Cath and cannot be adequately measured. The lungs remain hyperaerated consistent with emphysema. No new parenchymal infiltrate is seen and no pleural effusion is noted. Mediastinal and hilar contours are unchanged. Heart size is stable. No acute bony abnormality is seen. IMPRESSION: 1. Progressive increase in size of the dominant left upper lobe lesion. 2. The second dominant lesion in the right upper lobe is overlain by the port of the  Port-A-Cath and cannot be measured accurately. No new abnormality is seen. Electronically Signed   By: Ivar Drape M.D.   On: 06/13/2015 16:58   Mr Jeri Cos PY Contrast  07/03/2015  CLINICAL DATA:  Adenocarcinoma lung.  Rule out metastatic disease EXAM: MRI HEAD WITHOUT AND WITH CONTRAST TECHNIQUE: Multiplanar, multiecho pulse sequences of the brain and surrounding structures were obtained without and with intravenous contrast. CONTRAST:  14m MULTIHANCE GADOBENATE DIMEGLUMINE 529 MG/ML IV SOLN COMPARISON:  MRI 07/10/2014 FINDINGS: Image quality degraded by mild motion. Ventricle size normal.  Cerebral volume normal. Negative for acute infarct.  No significant chronic ischemic change. Negative for intracranial hemorrhage. Negative for mass or edema.  No shift of the midline structures. Postcontrast imaging reveals normal enhancement. No enhancing mass lesion. Leptomeningeal enhancement normal. Vascular enhancement normal. Well-circumscribed oval low-density area medial to the left cavernous carotid is unchanged and may represent an aneurysm measuring 11 mm. This shows enhancement postcontrast administration. Mucosal edema left sphenoid sinus.  Normal orbit. IMPRESSION: Negative for metastatic disease. 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 This could be confirmed with MRA or CTA of the brain. Electronically Signed   By: CFranchot GalloM.D.   On: 07/03/2015 10:29    ASSESSMENT AND PLAN: This is a very pleasant 64years old white female with stage IV non-small cell lung cancer currently undergoing systemic chemotherapy with carboplatin and Alimta status post 6 cycles.  She she completed a course of maintenance chemotherapy with single agent Alimta status post 6 cycles and tolerating her treatment fairly well but this was discontinued secondary to disease progression.  The patient is currently undergoing treatment with immunotherapy with Tecentriq (Huey Bienenstock status post 1  cycle. She tolerated the first cycle well. I recommended for her to proceed with cycle #3 today as a scheduled. For depression, she will continue on Remeron 30 mg by mouth daily at bedtime. For the acid reflux, the patient will continue Prilosec 20 mg by mouth twice a day. For her therapy related rashes, she was instructed to apply hydrocortisone cream to the area and she can use Claritin as well (without the decongestant) The patient would come back for follow-up visit in 3 weeks for reevaluation before starting cycle #4. CODE STATUS: FULL CODE STATUS but no prolonged support if she is in a vegetative state. She was advised to call immediately if she has any concerning symptoms in the interval. The patient voices understanding of current disease status and treatment options and is in agreement with the current care plan.  All questions were answered. The  patient knows to call the clinic with any problems, questions or concerns. We can certainly see the patient much sooner if necessary.     ADDENDUM: Hematology/Oncology Attending: I had a face to face encounter with the patient. I recommended her care plan. This is a very pleasant 64 years old white female with metastatic non-small cell lung cancer, adenocarcinoma. She is currently undergoing treatment with immunotherapy with Tecentriq Huey Bienenstock) status post 2 cycles. She is tolerating her treatment fairly well except for few spots of macular skin rash on the arms.  I recommended for the patient to proceed with cycle #3 today as a scheduled. She will come back for follow-up visit in 3 weeks for reevaluation after repeating CT scan of the chest, abdomen and pelvis for restaging of her disease. For the skin rash the patient will apply hydrocortisone cream to these areas. Disclaimer: This note was dictated with voice recognition software. Similar sounding words can inadvertently be transcribed and may be missed upon review.

## 2015-07-19 ENCOUNTER — Other Ambulatory Visit: Payer: Self-pay | Admitting: Medical Oncology

## 2015-07-19 NOTE — Progress Notes (Signed)
Faxed statement of medical necessity to [facility] [employer] - this is for tecentriq

## 2015-07-27 ENCOUNTER — Ambulatory Visit (HOSPITAL_COMMUNITY)
Admission: RE | Admit: 2015-07-27 | Discharge: 2015-07-27 | Disposition: A | Discharge status: Home / Self Care | Payer: Self-pay | Source: Ambulatory Visit | Attending: Internal Medicine | Admitting: Internal Medicine

## 2015-07-27 ENCOUNTER — Encounter (HOSPITAL_COMMUNITY): Payer: Self-pay

## 2015-07-27 DIAGNOSIS — I77811 Abdominal aortic ectasia: Secondary | ICD-10-CM | POA: Insufficient documentation

## 2015-07-27 DIAGNOSIS — N289 Disorder of kidney and ureter, unspecified: Secondary | ICD-10-CM | POA: Insufficient documentation

## 2015-07-27 DIAGNOSIS — C3491 Malignant neoplasm of unspecified part of right bronchus or lung: Secondary | ICD-10-CM | POA: Insufficient documentation

## 2015-07-27 DIAGNOSIS — Z5112 Encounter for antineoplastic immunotherapy: Secondary | ICD-10-CM

## 2015-07-27 DIAGNOSIS — Z90710 Acquired absence of both cervix and uterus: Secondary | ICD-10-CM | POA: Insufficient documentation

## 2015-07-27 DIAGNOSIS — R918 Other nonspecific abnormal finding of lung field: Secondary | ICD-10-CM | POA: Insufficient documentation

## 2015-07-27 MED ORDER — IOHEXOL 300 MG/ML  SOLN
150.0000 mL | Freq: Once | INTRAMUSCULAR | Status: AC | PRN
  Administered 2015-07-27: 100 mL via INTRAVENOUS

## 2015-07-31 ENCOUNTER — Ambulatory Visit (HOSPITAL_BASED_OUTPATIENT_CLINIC_OR_DEPARTMENT_OTHER): Payer: Self-pay

## 2015-07-31 ENCOUNTER — Encounter: Payer: Self-pay | Admitting: Internal Medicine

## 2015-07-31 ENCOUNTER — Other Ambulatory Visit: Payer: Self-pay | Admitting: Medical Oncology

## 2015-07-31 ENCOUNTER — Ambulatory Visit (HOSPITAL_BASED_OUTPATIENT_CLINIC_OR_DEPARTMENT_OTHER): Payer: Self-pay | Admitting: Internal Medicine

## 2015-07-31 ENCOUNTER — Other Ambulatory Visit (HOSPITAL_BASED_OUTPATIENT_CLINIC_OR_DEPARTMENT_OTHER): Payer: Self-pay

## 2015-07-31 VITALS — BP 127/81 | HR 103 | Temp 98.7°F | Resp 18 | Ht 64.0 in | Wt 195.9 lb

## 2015-07-31 DIAGNOSIS — F411 Generalized anxiety disorder: Secondary | ICD-10-CM

## 2015-07-31 DIAGNOSIS — C3490 Malignant neoplasm of unspecified part of unspecified bronchus or lung: Secondary | ICD-10-CM

## 2015-07-31 DIAGNOSIS — R5382 Chronic fatigue, unspecified: Secondary | ICD-10-CM

## 2015-07-31 DIAGNOSIS — Z79899 Other long term (current) drug therapy: Secondary | ICD-10-CM

## 2015-07-31 DIAGNOSIS — T451X5A Adverse effect of antineoplastic and immunosuppressive drugs, initial encounter: Secondary | ICD-10-CM

## 2015-07-31 DIAGNOSIS — C3491 Malignant neoplasm of unspecified part of right bronchus or lung: Secondary | ICD-10-CM

## 2015-07-31 DIAGNOSIS — D6481 Anemia due to antineoplastic chemotherapy: Secondary | ICD-10-CM

## 2015-07-31 DIAGNOSIS — Z5112 Encounter for antineoplastic immunotherapy: Secondary | ICD-10-CM

## 2015-07-31 DIAGNOSIS — L270 Generalized skin eruption due to drugs and medicaments taken internally: Secondary | ICD-10-CM

## 2015-07-31 DIAGNOSIS — C3492 Malignant neoplasm of unspecified part of left bronchus or lung: Secondary | ICD-10-CM

## 2015-07-31 DIAGNOSIS — F329 Major depressive disorder, single episode, unspecified: Secondary | ICD-10-CM

## 2015-07-31 DIAGNOSIS — F32A Depression, unspecified: Secondary | ICD-10-CM

## 2015-07-31 LAB — COMPREHENSIVE METABOLIC PANEL
ALBUMIN: 3.5 g/dL (ref 3.5–5.0)
ALK PHOS: 129 U/L (ref 40–150)
ALT: <9 U/L (ref 0–55)
ANION GAP: 13 meq/L — AB (ref 3–11)
AST: 15 U/L (ref 5–34)
BILIRUBIN TOTAL: 0.3 mg/dL (ref 0.20–1.20)
BUN: 9.2 mg/dL (ref 7.0–26.0)
CO2: 23 mEq/L (ref 22–29)
Calcium: 9.8 mg/dL (ref 8.4–10.4)
Chloride: 105 mEq/L (ref 98–109)
Creatinine: 0.7 mg/dL (ref 0.6–1.1)
EGFR: 87 mL/min/{1.73_m2} — AB (ref >90)
Glucose: 95 mg/dl (ref 70–140)
Potassium: 3.6 mEq/L (ref 3.5–5.1)
Sodium: 141 mEq/L (ref 136–145)
TOTAL PROTEIN: 7.8 g/dL (ref 6.4–8.3)

## 2015-07-31 LAB — CBC WITH DIFFERENTIAL/PLATELET
BASO%: 0.1 % (ref 0.0–2.0)
BASOS ABS: 0 10*3/uL (ref 0.0–0.1)
EOS ABS: 0.2 10*3/uL (ref 0.0–0.5)
EOS%: 2.9 % (ref 0.0–7.0)
HCT: 41.3 % (ref 34.8–46.6)
HEMOGLOBIN: 13.3 g/dL (ref 11.6–15.9)
LYMPH%: 23.1 % (ref 14.0–49.7)
MCH: 30 pg (ref 25.1–34.0)
MCHC: 32.2 g/dL (ref 31.5–36.0)
MCV: 92.9 fL (ref 79.5–101.0)
MONO#: 0.9 10*3/uL (ref 0.1–0.9)
MONO%: 10.8 % (ref 0.0–14.0)
NEUT#: 5.2 10*3/uL (ref 1.5–6.5)
NEUT%: 63.1 % (ref 38.4–76.8)
Platelets: 341 10*3/uL (ref 145–400)
RBC: 4.45 10*6/uL (ref 3.70–5.45)
RDW: 17.5 % — AB (ref 11.2–14.5)
WBC: 8.3 10*3/uL (ref 3.9–10.3)
lymph#: 1.9 10*3/uL (ref 0.9–3.3)

## 2015-07-31 LAB — TSH: TSH: 2.267 m[IU]/L (ref 0.308–3.960)

## 2015-07-31 MED ORDER — SODIUM CHLORIDE 0.9 % IV SOLN
1200.0000 mg | Freq: Once | INTRAVENOUS | Status: AC
  Administered 2015-07-31: 1200 mg via INTRAVENOUS
  Filled 2015-07-31: qty 20

## 2015-07-31 MED ORDER — SODIUM CHLORIDE 0.9 % IV SOLN
Freq: Once | INTRAVENOUS | Status: AC
  Administered 2015-07-31: 15:00:00 via INTRAVENOUS

## 2015-07-31 MED ORDER — HEPARIN SOD (PORK) LOCK FLUSH 100 UNIT/ML IV SOLN
500.0000 [IU] | Freq: Once | INTRAVENOUS | Status: AC | PRN
  Administered 2015-07-31: 500 [IU]
  Filled 2015-07-31: qty 5

## 2015-07-31 MED ORDER — SODIUM CHLORIDE 0.9 % IJ SOLN
10.0000 mL | INTRAMUSCULAR | Status: AC | PRN
  Administered 2015-07-31: 10 mL
  Filled 2015-07-31: qty 10

## 2015-07-31 NOTE — Patient Instructions (Signed)
Smoking Cessation, Tips for Success If you are ready to quit smoking, congratulations! You have chosen to help yourself be healthier. Cigarettes bring nicotine, tar, carbon monoxide, and other irritants into your body. Your lungs, heart, and blood vessels will be able to work better without these poisons. There are many different ways to quit smoking. Nicotine gum, nicotine patches, a nicotine inhaler, or nicotine nasal spray can help with physical craving. Hypnosis, support groups, and medicines help break the habit of smoking. WHAT THINGS CAN I DO TO MAKE QUITTING EASIER?  Here are some tips to help you quit for good:  Pick a date when you will quit smoking completely. Tell all of your friends and family about your plan to quit on that date.  Do not try to slowly cut down on the number of cigarettes you are smoking. Pick a quit date and quit smoking completely starting on that day.  Throw away all cigarettes.   Clean and remove all ashtrays from your home, work, and car.  On a card, write down your reasons for quitting. Carry the card with you and read it when you get the urge to smoke.  Cleanse your body of nicotine. Drink enough water and fluids to keep your urine clear or pale yellow. Do this after quitting to flush the nicotine from your body.  Learn to predict your moods. Do not let a bad situation be your excuse to have a cigarette. Some situations in your life might tempt you into wanting a cigarette.  Never have "just one" cigarette. It leads to wanting another and another. Remind yourself of your decision to quit.  Change habits associated with smoking. If you smoked while driving or when feeling stressed, try other activities to replace smoking. Stand up when drinking your coffee. Brush your teeth after eating. Sit in a different chair when you read the paper. Avoid alcohol while trying to quit, and try to drink fewer caffeinated beverages. Alcohol and caffeine may urge you to  smoke.  Avoid foods and drinks that can trigger a desire to smoke, such as sugary or spicy foods and alcohol.  Ask people who smoke not to smoke around you.  Have something planned to do right after eating or having a cup of coffee. For example, plan to take a walk or exercise.  Try a relaxation exercise to calm you down and decrease your stress. Remember, you may be tense and nervous for the first 2 weeks after you quit, but this will pass.  Find new activities to keep your hands busy. Play with a pen, coin, or rubber band. Doodle or draw things on paper.  Brush your teeth right after eating. This will help cut down on the craving for the taste of tobacco after meals. You can also try mouthwash.   Use oral substitutes in place of cigarettes. Try using lemon drops, carrots, cinnamon sticks, or chewing gum. Keep them handy so they are available when you have the urge to smoke.  When you have the urge to smoke, try deep breathing.  Designate your home as a nonsmoking area.  If you are a heavy smoker, ask your health care provider about a prescription for nicotine chewing gum. It can ease your withdrawal from nicotine.  Reward yourself. Set aside the cigarette money you save and buy yourself something nice.  Look for support from others. Join a support group or smoking cessation program. Ask someone at home or at work to help you with your plan   to quit smoking.  Always ask yourself, "Do I need this cigarette or is this just a reflex?" Tell yourself, "Today, I choose not to smoke," or "I do not want to smoke." You are reminding yourself of your decision to quit.  Do not replace cigarette smoking with electronic cigarettes (commonly called e-cigarettes). The safety of e-cigarettes is unknown, and some may contain harmful chemicals.  If you relapse, do not give up! Plan ahead and think about what you will do the next time you get the urge to smoke. HOW WILL I FEEL WHEN I QUIT SMOKING? You  may have symptoms of withdrawal because your body is used to nicotine (the addictive substance in cigarettes). You may crave cigarettes, be irritable, feel very hungry, cough often, get headaches, or have difficulty concentrating. The withdrawal symptoms are only temporary. They are strongest when you first quit but will go away within 10-14 days. When withdrawal symptoms occur, stay in control. Think about your reasons for quitting. Remind yourself that these are signs that your body is healing and getting used to being without cigarettes. Remember that withdrawal symptoms are easier to treat than the major diseases that smoking can cause.  Even after the withdrawal is over, expect periodic urges to smoke. However, these cravings are generally short lived and will go away whether you smoke or not. Do not smoke! WHAT RESOURCES ARE AVAILABLE TO HELP ME QUIT SMOKING? Your health care provider can direct you to community resources or hospitals for support, which may include:  Group support.  Education.  Hypnosis.  Therapy.   This information is not intended to replace advice given to you by your health care provider. Make sure you discuss any questions you have with your health care provider.   Document Released: 04/11/2004 Document Revised: 08/04/2014 Document Reviewed: 12/30/2012 Elsevier Interactive Patient Education 2016 [employer] Inc[employer]  

## 2015-07-31 NOTE — Progress Notes (Signed)
[place] [phone]   [phone]  OFFICE PROGRESS NOTE  Lanier Clam, MD [address] [place] [address]  DIAGNOSIS: Stage IV (T2a, N0, M1a) non-small cell lung cancer, adenocarcinoma presented with bilateral pulmonary lesions. This could be also consider as synchronous primary tumors.  PRIOR THERAPY:  1) Systemic chemotherapy with carboplatin for AUC of 5 and Alimta 500 MG/M2 every 3 weeks. Status post 6 cycles. 2) Treatment according to the BMS checkmate 370 clinical trial. Randomized to group A arm B1 treatment with pemetrexed (Alimta) 500 mg/m given every 3 weeks. Status post 6 cycles discontinued secondary to disease progression.   CURRENT THERAPY: Tecentriq 1200 mg IV every 3 weeks status post 3 cycles. INTERVAL HISTORY: Heather Banks 65 y.o. female returns to the clinic today for follow-up visit. The patient  Is currently on treatment with immunotherapy with Tecentriq Huey Bienenstock)  Status post 3 cycles and tolerating her treatment fairly well except for a few spots of macular rash on the upper extremities.  She applied over the counter hydrocortisone cream with minimal improvement. She denied having any significant weight loss or night sweats. She denied having any significant nausea or vomiting. She has no fever or chills. The patient denied having any significant chest pain, shortness of breath, was no cough or hemoptysis.  She had repeat CT scan of the chest , abdomen and pelvis performed recently and she is here today for evaluation before starting cycle #4 of her immunotherapy.  MEDICAL HISTORY: Past Medical History  Diagnosis Date  . COPD (chronic obstructive pulmonary disease) (HCC)     Albuterol neb as needed;Pulmicort neb daily  . GERD (gastroesophageal reflux disease)     takes Pantoprazole and Zantac daily  . Lung mass   . Pneumonia     hx of-last time about 4+yrs ago  . History of bronchitis     >5 yrs  ago  . History of migraine     last one 2 wks ago  . Panic attacks     but doesn't take any meds  . Encounter for antineoplastic chemotherapy 01/21/2015  . Full code status 05/01/2015    ALLERGIES:  has No Known Allergies.  MEDICATIONS:  Current Outpatient Prescriptions  Medication Sig Dispense Refill  . albuterol (PROVENTIL HFA;VENTOLIN HFA) 108 (90 BASE) MCG/ACT inhaler Inhale 2 puffs into the lungs every 6 (six) hours as needed for wheezing or shortness of breath. 1 Inhaler 6  . albuterol (PROVENTIL) (2.5 MG/3ML) 0.083% nebulizer solution Take 3 mLs (2.5 mg total) by nebulization every 6 (six) hours as needed for wheezing or shortness of breath. 75 mL 3  . aspirin-acetaminophen-caffeine (EXCEDRIN MIGRAINE) 503-546-56 MG per tablet Take 2 tablets by mouth every 6 (six) hours as needed for headache.    . Fluticasone Furoate-Vilanterol 100-25 MCG/INH AEPB Inhale 1 puff into the lungs daily. 1 each 3  . lidocaine-prilocaine (EMLA) cream Apply 1 application topically as needed. (Patient taking differently: Apply 1 application topically as needed (For port-a-cath.). ) 30 g 1  . Multiple Vitamins-Minerals (MULTIVITAMIN WITH MINERALS) tablet Take 1 tablet by mouth daily.    Marland Kitchen omeprazole (PRILOSEC) 20 MG capsule TAKE 1 CAPSULE BY MOUTH TWICE DAILY BEFORE A MEAL 60 capsule 0  . polyethylene glycol (MIRALAX / GLYCOLAX) packet Take 17 g by mouth daily. Takes 2 caps every night    . Probiotic Product (PROBIOTIC DAILY PO) Take by mouth.    . prochlorperazine (COMPAZINE) 10 MG tablet Take  1 tablet (10 mg total) by mouth every 6 (six) hours as needed for nausea or vomiting. 30 tablet 0  . ALPRAZolam (XANAX) 0.5 MG tablet Take 1 tablet (0.5 mg total) by mouth at bedtime as needed for anxiety. (Patient not taking: Reported on 07/31/2015) 30 tablet 0  . mirtazapine (REMERON) 30 MG tablet Take 1 tablet (30 mg total) by mouth at bedtime. (Patient not taking: Reported on 07/31/2015) 30 tablet 0  . nicotine  (NICODERM CQ) 21 mg/24hr patch Place 1 patch (21 mg total) onto the skin daily. (Patient not taking: Reported on 07/31/2015) 28 patch 0   No current facility-administered medications for this visit.    SURGICAL HISTORY:  Past Surgical History  Procedure Laterality Date  . Abdominal hysterectomy    . Oophorectomy    . Appendectomy    . Tonsillectomy    . Video bronchoscopy with endobronchial navigation N/A 08/14/2014    Procedure: VIDEO BRONCHOSCOPY WITH ENDOBRONCHIAL NAVIGATION;  Surgeon: Rigoberto Noel, MD;  Location: [facility];  Service: Thoracic;  Laterality: N/A;  . Portacath placement  jan. 2016    REVIEW OF SYSTEMS:  Constitutional: positive for fatigue Eyes: negative Ears, nose, mouth, throat, and face: negative Respiratory: negative Cardiovascular: negative Gastrointestinal: negative Genitourinary:negative Integument/breast: positive for pruritus and rash Hematologic/lymphatic: negative Musculoskeletal:negative Neurological: negative Behavioral/Psych: negative Endocrine: negative Allergic/Immunologic: negative   PHYSICAL EXAMINATION: General appearance: alert, cooperative and no distress Head: Normocephalic, without obvious abnormality, atraumatic Neck: no adenopathy, no JVD, supple, symmetrical, trachea midline and thyroid not enlarged, symmetric, no tenderness/mass/nodules Lymph nodes: Cervical, supraclavicular, and axillary nodes normal. Resp: clear to auscultation bilaterally Back: symmetric, no curvature. ROM normal. No CVA tenderness. Cardio: regular rate and rhythm, S1, S2 normal, no murmur, click, rub or gallop GI: soft, non-tender; bowel sounds normal; no masses,  no organomegaly Extremities: extremities normal, atraumatic, no cyanosis or edema Neurologic: Alert and oriented X 3, normal strength and tone. Normal symmetric reflexes. Normal coordination and gait   skin exam:  Few macular lesions on the upper extremities.  ECOG PERFORMANCE STATUS: 1 - Symptomatic but  completely ambulatory  Blood pressure 127/81, pulse 103, temperature 98.7 F (37.1 C), temperature source Oral, resp. rate 18, height '5\' 4"'$  (1.626 m), weight 195 lb 14.4 oz (88.86 kg), SpO2 100 %.  LABORATORY DATA: Lab Results  Component Value Date   WBC 8.3 07/31/2015   HGB 13.3 07/31/2015   HCT 41.3 07/31/2015   MCV 92.9 07/31/2015   PLT 341 07/31/2015      Chemistry      Component Value Date/Time   NA 142 07/11/2015 0827   NA 138 08/11/2014 0837   K 4.5 07/11/2015 0827   K 3.8 08/11/2014 0837   CL 103 08/11/2014 0837   CO2 25 07/11/2015 0827   CO2 23 08/11/2014 0837   BUN 9.7 07/11/2015 0827   BUN 8 08/11/2014 0837   CREATININE 0.8 07/11/2015 0827   CREATININE 0.57 08/11/2014 0837      Component Value Date/Time   CALCIUM 9.9 07/11/2015 0827   CALCIUM 9.0 08/11/2014 0837   ALKPHOS 154* 07/11/2015 0827   ALKPHOS 145* 07/10/2014 0459   AST 32 07/11/2015 0827   AST 11 07/10/2014 0459   ALT 18 07/11/2015 0827   ALT 8 07/10/2014 0459   BILITOT <0.30 07/11/2015 0827   BILITOT <0.2* 07/10/2014 0459       RADIOGRAPHIC STUDIES: Ct Chest W Contrast  07/27/2015  CLINICAL DATA:  Patient with history of adenocarcinoma of the lung  diagnosed 07/2014. EXAM: CT CHEST, ABDOMEN, AND PELVIS WITH CONTRAST TECHNIQUE: Multidetector CT imaging of the chest, abdomen and pelvis was performed following the standard protocol during bolus administration of intravenous contrast. CONTRAST:  172m OMNIPAQUE IOHEXOL 300 MG/ML  SOLN COMPARISON:  CT CAP 05/15/2015 ; 03/15/2015; 12/28/2014. FINDINGS: RECIST 1.1 Target Lesions: 1. Left upper lobe mass (image 13; series 5) minimally increased in size measuring 4.7 x 5.1 cm (4.9 x 4.6 cm on prior). 2. Right upper lobe nodule measuring 2.2 x 1.8 cm (image 16; series 5), unchanged from prior were it measured 2.2 x 1.8 cm. Non-target Lesions: 1. None CT CHEST Mediastinum/Nodes: Right anterior chest wall Port-A-Cath is present with tip terminating the  superior vena cava. Visualized thyroid is unremarkable. No axillary lymphadenopathy. Stable prominent 8 mm prevascular lymph node (image 22; series 2). Heart size. Coronary arterial vascular calcifications. No pericardial effusion. Aorta and main pulmonary artery in caliber. Lungs/Pleura: Central airways are patent. Interval increase in size of left upper lobe mass measuring 6.5 x 4.7 x 5.1 cm, previously 5.7 x 4.8 x 4.6 cm (image 13; series 5). Grossly unchanged right upper lobe spiculated nodule measuring 2.2 x 1.8 cm (image 16; series 5), previously the same. Unchanged focal area of architectural distortion and ground-glass attenuation within the right upper lobe measuring 2.7 x 3.5 cm (image 20; series 5). Additional adjacent 11 mm nodules also unchanged, concerning for an additional focus of disease (image 22; series 5). No pleural effusion or pneumothorax. Interval increase in ground-glass attenuation within the inferior aspect the left upper lobe (image 20; series 5). Stable 3 mm left lower lobe nodule (image 30; series 5). Musculoskeletal: No aggressive or acute appearing osseous lesions. CT ABDOMEN AND PELVIS Hepatobiliary: Liver is normal in size and contour. No focal hepatic lesion is identified. Gallbladder is unremarkable. No intrahepatic or extrahepatic biliary ductal dilatation. Pancreas: Unremarkable Spleen: Unremarkable Adrenals/Urinary Tract: Adrenal glands are normal. Kidneys enhance symmetrically with contrast. Unchanged low-attenuation lesions within the right kidney, the largest of which is compatible with a simple cyst. Urinary bladder is unremarkable. Stomach/Bowel: Oral contrast material is demonstrated to the level of the descending colon. No abnormal bowel wall thickening or evidence for bowel obstruction. No free fluid or free intraperitoneal air. Normal morphology to the stomach. Vascular/Lymphatic: Unchanged infrarenal abdominal aortic ectasia measuring 2.8 cm. Peripheral calcified  atherosclerotic plaque. No retroperitoneal lymphadenopathy. Other: Status post hysterectomy. Musculoskeletal: No aggressive or acute appearing osseous lesions. IMPRESSION: Interval progression of disease with increase in size of left upper lobe mass. There is increased adjacent ground-glass attenuation which may represent extension of disease or associated infectious/ inflammatory process. Two adjacent right upper lobe nodules are grossly stable in size when compared to prior examination. No evidence for metastatic disease in the abdomen or pelvis. Electronically Signed   By: DLovey NewcomerM.D.   On: 07/27/2015 08:41   Mr BJeri CosWXBContrast  07/03/2015  CLINICAL DATA:  Adenocarcinoma lung.  Rule out metastatic disease EXAM: MRI HEAD WITHOUT AND WITH CONTRAST TECHNIQUE: Multiplanar, multiecho pulse sequences of the brain and surrounding structures were obtained without and with intravenous contrast. CONTRAST:  155mMULTIHANCE GADOBENATE DIMEGLUMINE 529 MG/ML IV SOLN COMPARISON:  MRI 07/10/2014 FINDINGS: Image quality degraded by mild motion. Ventricle size normal.  Cerebral volume normal. Negative for acute infarct.  No significant chronic ischemic change. Negative for intracranial hemorrhage. Negative for mass or edema.  No shift of the midline structures. Postcontrast imaging reveals normal enhancement. No enhancing mass lesion. Leptomeningeal enhancement  normal. Vascular enhancement normal. Well-circumscribed oval low-density area medial to the left cavernous carotid is unchanged and may represent an aneurysm measuring 11 mm. This shows enhancement postcontrast administration. Mucosal edema left sphenoid sinus.  Normal orbit. IMPRESSION: Negative for metastatic disease. 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 This could be confirmed with MRA or CTA of the brain. Electronically Signed   By: Franchot Gallo M.D.   On: 07/03/2015 10:29   Ct Abdomen Pelvis W Contrast  07/27/2015   CLINICAL DATA:  Patient with history of adenocarcinoma of the lung diagnosed 07/2014. EXAM: CT CHEST, ABDOMEN, AND PELVIS WITH CONTRAST TECHNIQUE: Multidetector CT imaging of the chest, abdomen and pelvis was performed following the standard protocol during bolus administration of intravenous contrast. CONTRAST:  155m OMNIPAQUE IOHEXOL 300 MG/ML  SOLN COMPARISON:  CT CAP 05/15/2015 ; 03/15/2015; 12/28/2014. FINDINGS: RECIST 1.1 Target Lesions: 1. Left upper lobe mass (image 13; series 5) minimally increased in size measuring 4.7 x 5.1 cm (4.9 x 4.6 cm on prior). 2. Right upper lobe nodule measuring 2.2 x 1.8 cm (image 16; series 5), unchanged from prior were it measured 2.2 x 1.8 cm. Non-target Lesions: 1. None CT CHEST Mediastinum/Nodes: Right anterior chest wall Port-A-Cath is present with tip terminating the superior vena cava. Visualized thyroid is unremarkable. No axillary lymphadenopathy. Stable prominent 8 mm prevascular lymph node (image 22; series 2). Heart size. Coronary arterial vascular calcifications. No pericardial effusion. Aorta and main pulmonary artery in caliber. Lungs/Pleura: Central airways are patent. Interval increase in size of left upper lobe mass measuring 6.5 x 4.7 x 5.1 cm, previously 5.7 x 4.8 x 4.6 cm (image 13; series 5). Grossly unchanged right upper lobe spiculated nodule measuring 2.2 x 1.8 cm (image 16; series 5), previously the same. Unchanged focal area of architectural distortion and ground-glass attenuation within the right upper lobe measuring 2.7 x 3.5 cm (image 20; series 5). Additional adjacent 11 mm nodules also unchanged, concerning for an additional focus of disease (image 22; series 5). No pleural effusion or pneumothorax. Interval increase in ground-glass attenuation within the inferior aspect the left upper lobe (image 20; series 5). Stable 3 mm left lower lobe nodule (image 30; series 5). Musculoskeletal: No aggressive or acute appearing osseous lesions. CT ABDOMEN  AND PELVIS Hepatobiliary: Liver is normal in size and contour. No focal hepatic lesion is identified. Gallbladder is unremarkable. No intrahepatic or extrahepatic biliary ductal dilatation. Pancreas: Unremarkable Spleen: Unremarkable Adrenals/Urinary Tract: Adrenal glands are normal. Kidneys enhance symmetrically with contrast. Unchanged low-attenuation lesions within the right kidney, the largest of which is compatible with a simple cyst. Urinary bladder is unremarkable. Stomach/Bowel: Oral contrast material is demonstrated to the level of the descending colon. No abnormal bowel wall thickening or evidence for bowel obstruction. No free fluid or free intraperitoneal air. Normal morphology to the stomach. Vascular/Lymphatic: Unchanged infrarenal abdominal aortic ectasia measuring 2.8 cm. Peripheral calcified atherosclerotic plaque. No retroperitoneal lymphadenopathy. Other: Status post hysterectomy. Musculoskeletal: No aggressive or acute appearing osseous lesions. IMPRESSION: Interval progression of disease with increase in size of left upper lobe mass. There is increased adjacent ground-glass attenuation which may represent extension of disease or associated infectious/ inflammatory process. Two adjacent right upper lobe nodules are grossly stable in size when compared to prior examination. No evidence for metastatic disease in the abdomen or pelvis. Electronically Signed   By: DLovey NewcomerM.D.   On: 07/27/2015 08:41    ASSESSMENT AND PLAN: This is a very pleasant  65 years old white female with stage IV non-small cell lung cancer currently undergoing systemic chemotherapy with carboplatin and Alimta status post 6 cycles.  She she completed a course of maintenance chemotherapy with single agent Alimta status post 6 cycles and tolerating her treatment fairly well but this was discontinued secondary to disease progression.  The patient is currently undergoing treatment with immunotherapy with Tecentriq  Huey Bienenstock) status post 3 cycles. She tolerated the first 3 cycles  If her treatment fairly well except for grade 1 macular skin rash on the lower extremities.  the recent CT scan of the chest showed mild increase in the size of the left upper lobe mass.  I discussed the scan results and showed the images to the patient today. She is currently asymptomatic.  I recommended for her to continue with her current treatment with immunotherapy for 3 more cycles before repeating another scan for restaging of her disease. I recommended for her to proceed with cycle #4 today as a scheduled. For depression, she will continue on Remeron 30 mg by mouth daily at bedtime. For the acid reflux, the patient will continue Prilosec 20 mg by mouth twice a day. The patient would come back for follow-up visit in 3 weeks for reevaluation before starting cycle #5. CODE STATUS: FULL CODE STATUS but no prolonged support if she is in a vegetative state. She was advised to call immediately if she has any concerning symptoms in the interval. The patient voices understanding of current disease status and treatment options and is in agreement with the current care plan.  All questions were answered. The patient knows to call the clinic with any problems, questions or concerns. We can certainly see the patient much sooner if necessary.  Disclaimer: This note was dictated with voice recognition software. Similar sounding words can inadvertently be transcribed and may not be corrected upon review.

## 2015-07-31 NOTE — Patient Instructions (Signed)
[facility] Discharge Instructions for Patients Receiving Chemotherapy  Today you received the following chemotherapy agents: Tecentriq  To help prevent nausea and vomiting after your treatment, we encourage you to take your nausea medication as directed.    If you develop nausea and vomiting that is not controlled by your nausea medication, call the clinic.   BELOW ARE SYMPTOMS THAT SHOULD BE REPORTED IMMEDIATELY:  *FEVER GREATER THAN 100.5 F  *CHILLS WITH OR WITHOUT FEVER  NAUSEA AND VOMITING THAT IS NOT CONTROLLED WITH YOUR NAUSEA MEDICATION  *UNUSUAL SHORTNESS OF BREATH  *UNUSUAL BRUISING OR BLEEDING  TENDERNESS IN MOUTH AND THROAT WITH OR WITHOUT PRESENCE OF ULCERS  *URINARY PROBLEMS  *BOWEL PROBLEMS  UNUSUAL RASH Items with * indicate a potential emergency and should be followed up as soon as possible.  Feel free to call the clinic you have any questions or concerns. The clinic phone number is [phone].  Please show the CHEMO ALERT CARD at check-in to the Emergency Department and triage nurse.   

## 2015-08-01 ENCOUNTER — Telehealth: Payer: Self-pay | Admitting: Internal Medicine

## 2015-08-01 ENCOUNTER — Other Ambulatory Visit: Payer: Self-pay | Admitting: Internal Medicine

## 2015-08-01 NOTE — Telephone Encounter (Signed)
Patient already on schedule for next 3 week cycle (1/24) per 1/3 pof. Added an additional cycle due to provider availability. Left message for patient - patient to get new schedule 1/24.

## 2015-08-08 ENCOUNTER — Ambulatory Visit: Payer: Self-pay | Admitting: Pulmonary Disease

## 2015-08-13 ENCOUNTER — Telehealth: Payer: Self-pay | Admitting: Medical Oncology

## 2015-08-13 NOTE — Telephone Encounter (Signed)
Drug approval.

## 2015-08-20 ENCOUNTER — Other Ambulatory Visit: Payer: Self-pay | Admitting: Medical Oncology

## 2015-08-20 DIAGNOSIS — C3492 Malignant neoplasm of unspecified part of left bronchus or lung: Secondary | ICD-10-CM

## 2015-08-21 ENCOUNTER — Other Ambulatory Visit (HOSPITAL_BASED_OUTPATIENT_CLINIC_OR_DEPARTMENT_OTHER): Payer: Self-pay

## 2015-08-21 ENCOUNTER — Encounter: Payer: Self-pay | Admitting: Medical Oncology

## 2015-08-21 ENCOUNTER — Encounter: Payer: Self-pay | Admitting: Oncology

## 2015-08-21 ENCOUNTER — Encounter: Payer: Self-pay | Admitting: Internal Medicine

## 2015-08-21 ENCOUNTER — Telehealth: Payer: Self-pay | Admitting: Internal Medicine

## 2015-08-21 ENCOUNTER — Ambulatory Visit (HOSPITAL_BASED_OUTPATIENT_CLINIC_OR_DEPARTMENT_OTHER): Payer: Self-pay

## 2015-08-21 ENCOUNTER — Ambulatory Visit (HOSPITAL_BASED_OUTPATIENT_CLINIC_OR_DEPARTMENT_OTHER): Payer: Self-pay | Admitting: Internal Medicine

## 2015-08-21 ENCOUNTER — Other Ambulatory Visit: Payer: Self-pay | Admitting: Medical Oncology

## 2015-08-21 VITALS — BP 133/79 | HR 113 | Temp 97.9°F | Resp 19 | Ht 64.0 in | Wt 197.0 lb

## 2015-08-21 DIAGNOSIS — C3491 Malignant neoplasm of unspecified part of right bronchus or lung: Secondary | ICD-10-CM

## 2015-08-21 DIAGNOSIS — F329 Major depressive disorder, single episode, unspecified: Secondary | ICD-10-CM

## 2015-08-21 DIAGNOSIS — C3490 Malignant neoplasm of unspecified part of unspecified bronchus or lung: Secondary | ICD-10-CM

## 2015-08-21 DIAGNOSIS — L270 Generalized skin eruption due to drugs and medicaments taken internally: Secondary | ICD-10-CM

## 2015-08-21 DIAGNOSIS — C3492 Malignant neoplasm of unspecified part of left bronchus or lung: Secondary | ICD-10-CM

## 2015-08-21 DIAGNOSIS — K219 Gastro-esophageal reflux disease without esophagitis: Secondary | ICD-10-CM

## 2015-08-21 DIAGNOSIS — Z5112 Encounter for antineoplastic immunotherapy: Secondary | ICD-10-CM

## 2015-08-21 DIAGNOSIS — R5382 Chronic fatigue, unspecified: Secondary | ICD-10-CM

## 2015-08-21 DIAGNOSIS — Z79899 Other long term (current) drug therapy: Secondary | ICD-10-CM

## 2015-08-21 LAB — CBC WITH DIFFERENTIAL/PLATELET
BASO%: 0.3 % (ref 0.0–2.0)
Basophils Absolute: 0 10*3/uL (ref 0.0–0.1)
EOS ABS: 0.2 10*3/uL (ref 0.0–0.5)
EOS%: 2.2 % (ref 0.0–7.0)
HCT: 42.2 % (ref 34.8–46.6)
HEMOGLOBIN: 13.7 g/dL (ref 11.6–15.9)
LYMPH%: 27.6 % (ref 14.0–49.7)
MCH: 29.8 pg (ref 25.1–34.0)
MCHC: 32.5 g/dL (ref 31.5–36.0)
MCV: 91.9 fL (ref 79.5–101.0)
MONO#: 0.7 10*3/uL (ref 0.1–0.9)
MONO%: 9.9 % (ref 0.0–14.0)
NEUT%: 60 % (ref 38.4–76.8)
NEUTROS ABS: 4.4 10*3/uL (ref 1.5–6.5)
Platelets: 311 10*3/uL (ref 145–400)
RBC: 4.59 10*6/uL (ref 3.70–5.45)
RDW: 16 % — AB (ref 11.2–14.5)
WBC: 7.3 10*3/uL (ref 3.9–10.3)
lymph#: 2 10*3/uL (ref 0.9–3.3)

## 2015-08-21 LAB — COMPREHENSIVE METABOLIC PANEL
ALBUMIN: 3.6 g/dL (ref 3.5–5.0)
ALK PHOS: 149 U/L (ref 40–150)
ALT: 13 U/L (ref 0–55)
AST: 16 U/L (ref 5–34)
Anion Gap: 12 mEq/L — ABNORMAL HIGH (ref 3–11)
BUN: 12.1 mg/dL (ref 7.0–26.0)
CO2: 22 mEq/L (ref 22–29)
CREATININE: 0.9 mg/dL (ref 0.6–1.1)
Calcium: 9.3 mg/dL (ref 8.4–10.4)
Chloride: 107 mEq/L (ref 98–109)
EGFR: 73 mL/min/{1.73_m2} — AB (ref >90)
GLUCOSE: 112 mg/dL (ref 70–140)
Potassium: 4.2 mEq/L (ref 3.5–5.1)
SODIUM: 142 meq/L (ref 136–145)
TOTAL PROTEIN: 7.7 g/dL (ref 6.4–8.3)

## 2015-08-21 LAB — TSH: TSH: 0.992 m[IU]/L (ref 0.308–3.960)

## 2015-08-21 MED ORDER — HEPARIN SOD (PORK) LOCK FLUSH 100 UNIT/ML IV SOLN
500.0000 [IU] | Freq: Once | INTRAVENOUS | Status: AC | PRN
  Administered 2015-08-21: 500 [IU]
  Filled 2015-08-21: qty 5

## 2015-08-21 MED ORDER — SODIUM CHLORIDE 0.9 % IV SOLN
Freq: Once | INTRAVENOUS | Status: AC
  Administered 2015-08-21: 13:00:00 via INTRAVENOUS

## 2015-08-21 MED ORDER — SODIUM CHLORIDE 0.9 % IJ SOLN
10.0000 mL | INTRAMUSCULAR | Status: DC | PRN
  Administered 2015-08-21: 10 mL
  Filled 2015-08-21: qty 10

## 2015-08-21 MED ORDER — SODIUM CHLORIDE 0.9 % IV SOLN
1200.0000 mg | Freq: Once | INTRAVENOUS | Status: AC
  Administered 2015-08-21: 1200 mg via INTRAVENOUS
  Filled 2015-08-21: qty 20

## 2015-08-21 NOTE — Telephone Encounter (Signed)
per pof to sch pt appt-sent MW email to sch pt trmt-pt aware °

## 2015-08-21 NOTE — Progress Notes (Signed)
[place] [phone]   [phone]  OFFICE PROGRESS NOTE  Lanier Clam, MD [address] [name] [address]  DIAGNOSIS: Stage IV (T2a, N0, M1a) non-small cell lung cancer, adenocarcinoma presented with bilateral pulmonary lesions. This could be also consider as synchronous primary tumors.  PRIOR THERAPY:  1) Systemic chemotherapy with carboplatin for AUC of 5 and Alimta 500 MG/M2 every 3 weeks. Status post 6 cycles. 2) Treatment according to the BMS checkmate 370 clinical trial. Randomized to group A arm B1 treatment with pemetrexed (Alimta) 500 mg/m given every 3 weeks. Status post 6 cycles discontinued secondary to disease progression.   CURRENT THERAPY: Tecentriq 1200 mg IV every 3 weeks status post 4 cycles.  INTERVAL HISTORY: Heather Banks 65 y.o. female returns to the clinic today for follow-up visit. The patient is currently on treatment with immunotherapy with Tecentriq Huey Bienenstock)  Status post 3 cycles and tolerating her treatment fairly well except for more spots of macular rash on the upper and lower extremities as well as the hands.  She applied over the counter hydrocortisone cream with minimal improvement. She also takes Benadryl for the itching. She denied having any significant weight loss or night sweats. She denied having any significant nausea or vomiting. She has no fever or chills. The patient denied having any significant chest pain, shortness of breath, was no cough or hemoptysis.    MEDICAL HISTORY: Past Medical History  Diagnosis Date  . COPD (chronic obstructive pulmonary disease) (HCC)     Albuterol neb as needed;Pulmicort neb daily  . GERD (gastroesophageal reflux disease)     takes Pantoprazole and Zantac daily  . Lung mass   . Pneumonia     hx of-last time about 4+yrs ago  . History of bronchitis     >5 yrs ago  . History of migraine     last one 2 wks ago  . Panic attacks     but doesn't  take any meds  . Encounter for antineoplastic chemotherapy 01/21/2015  . Full code status 05/01/2015  . Adenocarcinoma of left lung, stage 4 ([facility]) 07/09/2014    ALLERGIES:  has No Known Allergies.  MEDICATIONS:  Current Outpatient Prescriptions  Medication Sig Dispense Refill  . albuterol (PROVENTIL HFA;VENTOLIN HFA) 108 (90 BASE) MCG/ACT inhaler Inhale 2 puffs into the lungs every 6 (six) hours as needed for wheezing or shortness of breath. 1 Inhaler 6  . albuterol (PROVENTIL) (2.5 MG/3ML) 0.083% nebulizer solution Take 3 mLs (2.5 mg total) by nebulization every 6 (six) hours as needed for wheezing or shortness of breath. 75 mL 3  . ALPRAZolam (XANAX) 0.5 MG tablet Take 1 tablet (0.5 mg total) by mouth at bedtime as needed for anxiety. (Patient not taking: Reported on 07/31/2015) 30 tablet 0  . aspirin-acetaminophen-caffeine (EXCEDRIN MIGRAINE) 194-174-08 MG per tablet Take 2 tablets by mouth every 6 (six) hours as needed for headache.    . Fluticasone Furoate-Vilanterol 100-25 MCG/INH AEPB Inhale 1 puff into the lungs daily. 1 each 3  . lidocaine-prilocaine (EMLA) cream Apply 1 application topically as needed. (Patient taking differently: Apply 1 application topically as needed (For port-a-cath.). ) 30 g 1  . mirtazapine (REMERON) 30 MG tablet Take 1 tablet (30 mg total) by mouth at bedtime. (Patient not taking: Reported on 07/31/2015) 30 tablet 0  . Multiple Vitamins-Minerals (MULTIVITAMIN WITH MINERALS) tablet Take 1 tablet by mouth daily.    . nicotine (NICODERM CQ) 21 mg/24hr patch Place  1 patch (21 mg total) onto the skin daily. (Patient not taking: Reported on 07/31/2015) 28 patch 0  . omeprazole (PRILOSEC) 20 MG capsule TAKE 1 CAPSULE BY MOUTH TWICE DAILY BEFORE A MEAL 60 capsule 0  . polyethylene glycol (MIRALAX / GLYCOLAX) packet Take 17 g by mouth daily. Takes 2 caps every night    . Probiotic Product (PROBIOTIC DAILY PO) Take by mouth.    . prochlorperazine (COMPAZINE) 10 MG tablet Take 1  tablet (10 mg total) by mouth every 6 (six) hours as needed for nausea or vomiting. 30 tablet 0   No current facility-administered medications for this visit.   Facility-Administered Medications Ordered in Other Visits  Medication Dose Route Frequency Provider Last Rate Last Dose  . sodium chloride 0.9 % injection 10 mL  10 mL Intracatheter PRN Curt Bears, MD   10 mL at 07/31/15 1635    SURGICAL HISTORY:  Past Surgical History  Procedure Laterality Date  . Abdominal hysterectomy    . Oophorectomy    . Appendectomy    . Tonsillectomy    . Video bronchoscopy with endobronchial navigation N/A 08/14/2014    Procedure: VIDEO BRONCHOSCOPY WITH ENDOBRONCHIAL NAVIGATION;  Surgeon: Rigoberto Noel, MD;  Location: [facility];  Service: Thoracic;  Laterality: N/A;  . Portacath placement  jan. 2016    REVIEW OF SYSTEMS:  A comprehensive review of systems was negative except for: Constitutional: positive for fatigue Integument/breast: positive for dryness, pruritus and rash   PHYSICAL EXAMINATION: General appearance: alert, cooperative and no distress Head: Normocephalic, without obvious abnormality, atraumatic Neck: no adenopathy, no JVD, supple, symmetrical, trachea midline and thyroid not enlarged, symmetric, no tenderness/mass/nodules Lymph nodes: Cervical, supraclavicular, and axillary nodes normal. Resp: clear to auscultation bilaterally Back: symmetric, no curvature. ROM normal. No CVA tenderness. Cardio: regular rate and rhythm, S1, S2 normal, no murmur, click, rub or gallop GI: soft, non-tender; bowel sounds normal; no masses,  no organomegaly Extremities: extremities normal, atraumatic, no cyanosis or edema Neurologic: Alert and oriented X 3, normal strength and tone. Normal symmetric reflexes. Normal coordination and gait   skin exam:  Few macular lesions on the upper and lower extremities.       ECOG PERFORMANCE STATUS: 1 - Symptomatic but completely ambulatory  Blood pressure  133/79, pulse 113, temperature 97.9 F (36.6 C), temperature source Oral, resp. rate 19, height '5\' 4"'$  (1.626 m), weight 197 lb (89.359 kg), SpO2 97 %.  LABORATORY DATA: Lab Results  Component Value Date   WBC 7.3 08/21/2015   HGB 13.7 08/21/2015   HCT 42.2 08/21/2015   MCV 91.9 08/21/2015   PLT 311 08/21/2015      Chemistry      Component Value Date/Time   NA 142 08/21/2015 1121   NA 138 08/11/2014 0837   K 4.2 08/21/2015 1121   K 3.8 08/11/2014 0837   CL 103 08/11/2014 0837   CO2 22 08/21/2015 1121   CO2 23 08/11/2014 0837   BUN 12.1 08/21/2015 1121   BUN 8 08/11/2014 0837   CREATININE 0.9 08/21/2015 1121   CREATININE 0.57 08/11/2014 0837      Component Value Date/Time   CALCIUM 9.3 08/21/2015 1121   CALCIUM 9.0 08/11/2014 0837   ALKPHOS 149 08/21/2015 1121   ALKPHOS 145* 07/10/2014 0459   AST 16 08/21/2015 1121   AST 11 07/10/2014 0459   ALT 13 08/21/2015 1121   ALT 8 07/10/2014 0459   BILITOT <0.30 08/21/2015 1121   BILITOT <0.2* 07/10/2014 0459  RADIOGRAPHIC STUDIES: Ct Chest W Contrast  07/27/2015  CLINICAL DATA:  Patient with history of adenocarcinoma of the lung diagnosed 07/2014. EXAM: CT CHEST, ABDOMEN, AND PELVIS WITH CONTRAST TECHNIQUE: Multidetector CT imaging of the chest, abdomen and pelvis was performed following the standard protocol during bolus administration of intravenous contrast. CONTRAST:  128m OMNIPAQUE IOHEXOL 300 MG/ML  SOLN COMPARISON:  CT CAP 05/15/2015 ; 03/15/2015; 12/28/2014. FINDINGS: RECIST 1.1 Target Lesions: 1. Left upper lobe mass (image 13; series 5) minimally increased in size measuring 4.7 x 5.1 cm (4.9 x 4.6 cm on prior). 2. Right upper lobe nodule measuring 2.2 x 1.8 cm (image 16; series 5), unchanged from prior were it measured 2.2 x 1.8 cm. Non-target Lesions: 1. None CT CHEST Mediastinum/Nodes: Right anterior chest wall Port-A-Cath is present with tip terminating the superior vena cava. Visualized thyroid is unremarkable.  No axillary lymphadenopathy. Stable prominent 8 mm prevascular lymph node (image 22; series 2). Heart size. Coronary arterial vascular calcifications. No pericardial effusion. Aorta and main pulmonary artery in caliber. Lungs/Pleura: Central airways are patent. Interval increase in size of left upper lobe mass measuring 6.5 x 4.7 x 5.1 cm, previously 5.7 x 4.8 x 4.6 cm (image 13; series 5). Grossly unchanged right upper lobe spiculated nodule measuring 2.2 x 1.8 cm (image 16; series 5), previously the same. Unchanged focal area of architectural distortion and ground-glass attenuation within the right upper lobe measuring 2.7 x 3.5 cm (image 20; series 5). Additional adjacent 11 mm nodules also unchanged, concerning for an additional focus of disease (image 22; series 5). No pleural effusion or pneumothorax. Interval increase in ground-glass attenuation within the inferior aspect the left upper lobe (image 20; series 5). Stable 3 mm left lower lobe nodule (image 30; series 5). Musculoskeletal: No aggressive or acute appearing osseous lesions. CT ABDOMEN AND PELVIS Hepatobiliary: Liver is normal in size and contour. No focal hepatic lesion is identified. Gallbladder is unremarkable. No intrahepatic or extrahepatic biliary ductal dilatation. Pancreas: Unremarkable Spleen: Unremarkable Adrenals/Urinary Tract: Adrenal glands are normal. Kidneys enhance symmetrically with contrast. Unchanged low-attenuation lesions within the right kidney, the largest of which is compatible with a simple cyst. Urinary bladder is unremarkable. Stomach/Bowel: Oral contrast material is demonstrated to the level of the descending colon. No abnormal bowel wall thickening or evidence for bowel obstruction. No free fluid or free intraperitoneal air. Normal morphology to the stomach. Vascular/Lymphatic: Unchanged infrarenal abdominal aortic ectasia measuring 2.8 cm. Peripheral calcified atherosclerotic plaque. No retroperitoneal lymphadenopathy.  Other: Status post hysterectomy. Musculoskeletal: No aggressive or acute appearing osseous lesions. IMPRESSION: Interval progression of disease with increase in size of left upper lobe mass. There is increased adjacent ground-glass attenuation which may represent extension of disease or associated infectious/ inflammatory process. Two adjacent right upper lobe nodules are grossly stable in size when compared to prior examination. No evidence for metastatic disease in the abdomen or pelvis. Electronically Signed   By: DLovey NewcomerM.D.   On: 07/27/2015 08:41   Ct Abdomen Pelvis W Contrast  07/27/2015  CLINICAL DATA:  Patient with history of adenocarcinoma of the lung diagnosed 07/2014. EXAM: CT CHEST, ABDOMEN, AND PELVIS WITH CONTRAST TECHNIQUE: Multidetector CT imaging of the chest, abdomen and pelvis was performed following the standard protocol during bolus administration of intravenous contrast. CONTRAST:  1031mOMNIPAQUE IOHEXOL 300 MG/ML  SOLN COMPARISON:  CT CAP 05/15/2015 ; 03/15/2015; 12/28/2014. FINDINGS: RECIST 1.1 Target Lesions: 1. Left upper lobe mass (image 13; series 5) minimally increased in size measuring 4.7  x 5.1 cm (4.9 x 4.6 cm on prior). 2. Right upper lobe nodule measuring 2.2 x 1.8 cm (image 16; series 5), unchanged from prior were it measured 2.2 x 1.8 cm. Non-target Lesions: 1. None CT CHEST Mediastinum/Nodes: Right anterior chest wall Port-A-Cath is present with tip terminating the superior vena cava. Visualized thyroid is unremarkable. No axillary lymphadenopathy. Stable prominent 8 mm prevascular lymph node (image 22; series 2). Heart size. Coronary arterial vascular calcifications. No pericardial effusion. Aorta and main pulmonary artery in caliber. Lungs/Pleura: Central airways are patent. Interval increase in size of left upper lobe mass measuring 6.5 x 4.7 x 5.1 cm, previously 5.7 x 4.8 x 4.6 cm (image 13; series 5). Grossly unchanged right upper lobe spiculated nodule measuring  2.2 x 1.8 cm (image 16; series 5), previously the same. Unchanged focal area of architectural distortion and ground-glass attenuation within the right upper lobe measuring 2.7 x 3.5 cm (image 20; series 5). Additional adjacent 11 mm nodules also unchanged, concerning for an additional focus of disease (image 22; series 5). No pleural effusion or pneumothorax. Interval increase in ground-glass attenuation within the inferior aspect the left upper lobe (image 20; series 5). Stable 3 mm left lower lobe nodule (image 30; series 5). Musculoskeletal: No aggressive or acute appearing osseous lesions. CT ABDOMEN AND PELVIS Hepatobiliary: Liver is normal in size and contour. No focal hepatic lesion is identified. Gallbladder is unremarkable. No intrahepatic or extrahepatic biliary ductal dilatation. Pancreas: Unremarkable Spleen: Unremarkable Adrenals/Urinary Tract: Adrenal glands are normal. Kidneys enhance symmetrically with contrast. Unchanged low-attenuation lesions within the right kidney, the largest of which is compatible with a simple cyst. Urinary bladder is unremarkable. Stomach/Bowel: Oral contrast material is demonstrated to the level of the descending colon. No abnormal bowel wall thickening or evidence for bowel obstruction. No free fluid or free intraperitoneal air. Normal morphology to the stomach. Vascular/Lymphatic: Unchanged infrarenal abdominal aortic ectasia measuring 2.8 cm. Peripheral calcified atherosclerotic plaque. No retroperitoneal lymphadenopathy. Other: Status post hysterectomy. Musculoskeletal: No aggressive or acute appearing osseous lesions. IMPRESSION: Interval progression of disease with increase in size of left upper lobe mass. There is increased adjacent ground-glass attenuation which may represent extension of disease or associated infectious/ inflammatory process. Two adjacent right upper lobe nodules are grossly stable in size when compared to prior examination. No evidence for  metastatic disease in the abdomen or pelvis. Electronically Signed   By: Lovey Newcomer M.D.   On: 07/27/2015 08:41    ASSESSMENT AND PLAN: This is a very pleasant 65 years old white female with stage IV non-small cell lung cancer currently undergoing systemic chemotherapy with carboplatin and Alimta status post 6 cycles.  She she completed a course of maintenance chemotherapy with single agent Alimta status post 6 cycles and tolerating her treatment fairly well but this was discontinued secondary to disease progression.  The patient is currently undergoing treatment with immunotherapy with Tecentriq Huey Bienenstock) status post 4 cycles. She is tolerating her treatment fairly well except for grade 1-2 macular skin rash on the upper and lower extremities. I discussed with the patient discontinued her treatment and consideration of treatment with high-dose steroids but she would like to finish the the next 2 cycles of her treatment before repeating imaging studies.  I recommended for her to continue with her current treatment with her treatment with Tecentriq Huey Bienenstock) with cycle #5 today as a scheduled.  For the skin rash she will continue to apply hydrocortisone cream and take Benadryl for itching. For depression, she  will continue on Remeron 30 mg by mouth daily at bedtime. For the acid reflux, the patient will continue Prilosec 20 mg by mouth twice a day. The patient would come back for follow-up visit in 3 weeks for reevaluation before starting cycle #6. CODE STATUS: FULL CODE STATUS but no prolonged support if she is in a vegetative state. She was advised to call immediately if she has any concerning symptoms in the interval. The patient voices understanding of current disease status and treatment options and is in agreement with the current care plan.  All questions were answered. The patient knows to call the clinic with any problems, questions or concerns. We can certainly see the patient  much sooner if necessary.  Disclaimer: This note was dictated with voice recognition software. Similar sounding words can inadvertently be transcribed and may not be corrected upon review.

## 2015-08-21 NOTE — Progress Notes (Signed)
08/21/15 - BMS [id number] - Questionnaires (PRO's) - Patient into cancer center for routine visit.  Patient given PRO's upon arrival to the cancer center.  Patient completed the PRO's (EQ-5D-3L first and then FACT-L) before any study procedures were done.  The PRO's were checked for completeness.  The patient was thanked for her continued support in this clinical trial. [name] 08/21/15  - 11:35 am

## 2015-08-21 NOTE — Patient Instructions (Signed)
[facility] Discharge Instructions for Patients Receiving Chemotherapy  Today you received the following chemotherapy agents tecentriq  To help prevent nausea and vomiting after your treatment, we encourage you to take your nausea medication as directed  If you develop nausea and vomiting that is not controlled by your nausea medication, call the clinic.   BELOW ARE SYMPTOMS THAT SHOULD BE REPORTED IMMEDIATELY:  *FEVER GREATER THAN 100.5 F  *CHILLS WITH OR WITHOUT FEVER  NAUSEA AND VOMITING THAT IS NOT CONTROLLED WITH YOUR NAUSEA MEDICATION  *UNUSUAL SHORTNESS OF BREATH  *UNUSUAL BRUISING OR BLEEDING  TENDERNESS IN MOUTH AND THROAT WITH OR WITHOUT PRESENCE OF ULCERS  *URINARY PROBLEMS  *BOWEL PROBLEMS  UNUSUAL RASH Items with * indicate a potential emergency and should be followed up as soon as possible.  Feel free to call the clinic you have any questions or concerns. The clinic phone number is [phone].  

## 2015-08-21 NOTE — Progress Notes (Signed)
[id number]: Group A, Arm B1(Alimta) Follow-up #2 I met with patient after patient had her lab appointment and prior to patient having her vitals checked. Patient completed study required PRO's with research assistant, [name]. Patient here alone today for appointment with Dr. Julien Nordmann and continuation of current treatment of Tecentriq after progression on study with randomized arm Alimta. Patient has no continuing A/E's from when on study treatment. Patient maintaining weight and reports no significant nausea and denies any vomiting. Patient denies pain and maintains no change in energy level and continues with baseline fatigue. Patient continues with occasional SOB and cough. Patient denies any GI complaints and denies pain. Concomitant medications reviewed. Per Dr. Worthy Flank assessment, patient positive for macular rash to bilateral hands and bilateral lower extremities and various areas of abdomen and back, and per MD, macular rash is related to patient's current regimen of Tecentriq.  Patient was informed that her next follow-up for study will be in 3 months time, approximately this time in April of 2017. I thanked patient for her time and her continuous support of study. All patient's questions answered to her satisfaction and she was encouraged to contact Dr. Julien Nordmann or myself with any questions or concerns she may have related to study. Adele Dan, RN, [facility] Clinical Research 08/21/2015 1:52 PM

## 2015-08-22 ENCOUNTER — Telehealth: Payer: Self-pay | Admitting: *Deleted

## 2015-08-22 NOTE — Telephone Encounter (Signed)
Per staff message and POF I have scheduled appts. Advised scheduler of appts. JMW  

## 2015-08-23 ENCOUNTER — Other Ambulatory Visit: Payer: Self-pay | Admitting: *Deleted

## 2015-08-23 DIAGNOSIS — C3490 Malignant neoplasm of unspecified part of unspecified bronchus or lung: Secondary | ICD-10-CM

## 2015-08-23 MED ORDER — OMEPRAZOLE 20 MG PO CPDR: DELAYED_RELEASE_CAPSULE | ORAL | Status: DC

## 2015-08-23 NOTE — Telephone Encounter (Signed)
prilosec refilled per 1/24 progress note

## 2015-08-31 ENCOUNTER — Telehealth: Payer: Self-pay | Admitting: *Deleted

## 2015-08-31 NOTE — Telephone Encounter (Signed)
Call from pt pharmacy requesting refill on xanax. Last refill aug 2016. Pt advised she needs something for anxiety. Informed Pharmacist MD out of office will return Monday and message will be given to him for review.

## 2015-09-05 ENCOUNTER — Encounter: Payer: Self-pay | Admitting: Internal Medicine

## 2015-09-05 NOTE — Progress Notes (Signed)
Per Ana at [employer] was approved 08/10/15-08/09/16. She will send the approval notice to me--they had different fax#,

## 2015-09-06 ENCOUNTER — Encounter: Payer: Self-pay | Admitting: Internal Medicine

## 2015-09-06 NOTE — Progress Notes (Signed)
Per Wilhemena Durie tecentriq was approved 08/10/15-08/09/16  Case [id number]. Sent to medical records

## 2015-09-06 NOTE — Progress Notes (Signed)
I faxed confirmation of infusion [phone] for tecentriq

## 2015-09-07 ENCOUNTER — Encounter: Payer: Self-pay | Admitting: Internal Medicine

## 2015-09-07 NOTE — Progress Notes (Signed)
Per [employer] delivery has been set up for 05/29/15 06/19/15  07/11/15  07/31/15  08/21/15 replacment for tecentriq. I will send form for upcoming dates for feb and march.

## 2015-09-10 ENCOUNTER — Encounter: Payer: Self-pay | Admitting: Internal Medicine

## 2015-09-10 NOTE — Progress Notes (Signed)
I sent billing [facility] drug replcment for dates of service 05/29/15 06/19/15 07/11/15 07/31/15 08/21/15 for tecentriq

## 2015-09-11 ENCOUNTER — Encounter: Payer: Self-pay | Admitting: Internal Medicine

## 2015-09-11 ENCOUNTER — Other Ambulatory Visit: Payer: Self-pay

## 2015-09-11 ENCOUNTER — Other Ambulatory Visit (HOSPITAL_BASED_OUTPATIENT_CLINIC_OR_DEPARTMENT_OTHER): Payer: Self-pay

## 2015-09-11 ENCOUNTER — Ambulatory Visit (HOSPITAL_BASED_OUTPATIENT_CLINIC_OR_DEPARTMENT_OTHER): Payer: Self-pay

## 2015-09-11 ENCOUNTER — Ambulatory Visit (HOSPITAL_BASED_OUTPATIENT_CLINIC_OR_DEPARTMENT_OTHER): Payer: Self-pay | Admitting: Internal Medicine

## 2015-09-11 ENCOUNTER — Ambulatory Visit: Payer: Self-pay | Admitting: Internal Medicine

## 2015-09-11 ENCOUNTER — Encounter: Payer: Self-pay | Admitting: *Deleted

## 2015-09-11 VITALS — BP 159/77 | HR 81 | Temp 98.6°F | Resp 17 | Ht 64.0 in | Wt 199.0 lb

## 2015-09-11 DIAGNOSIS — J441 Chronic obstructive pulmonary disease with (acute) exacerbation: Secondary | ICD-10-CM

## 2015-09-11 DIAGNOSIS — Z789 Other specified health status: Secondary | ICD-10-CM

## 2015-09-11 DIAGNOSIS — Z5112 Encounter for antineoplastic immunotherapy: Secondary | ICD-10-CM

## 2015-09-11 DIAGNOSIS — Z79899 Other long term (current) drug therapy: Secondary | ICD-10-CM

## 2015-09-11 DIAGNOSIS — C3492 Malignant neoplasm of unspecified part of left bronchus or lung: Secondary | ICD-10-CM

## 2015-09-11 DIAGNOSIS — F329 Major depressive disorder, single episode, unspecified: Secondary | ICD-10-CM

## 2015-09-11 DIAGNOSIS — J449 Chronic obstructive pulmonary disease, unspecified: Secondary | ICD-10-CM

## 2015-09-11 DIAGNOSIS — C3491 Malignant neoplasm of unspecified part of right bronchus or lung: Secondary | ICD-10-CM

## 2015-09-11 DIAGNOSIS — L270 Generalized skin eruption due to drugs and medicaments taken internally: Secondary | ICD-10-CM

## 2015-09-11 DIAGNOSIS — Z5111 Encounter for antineoplastic chemotherapy: Secondary | ICD-10-CM

## 2015-09-11 DIAGNOSIS — F17200 Nicotine dependence, unspecified, uncomplicated: Secondary | ICD-10-CM

## 2015-09-11 DIAGNOSIS — F411 Generalized anxiety disorder: Secondary | ICD-10-CM

## 2015-09-11 DIAGNOSIS — R110 Nausea: Secondary | ICD-10-CM

## 2015-09-11 DIAGNOSIS — K219 Gastro-esophageal reflux disease without esophagitis: Secondary | ICD-10-CM

## 2015-09-11 DIAGNOSIS — F418 Other specified anxiety disorders: Secondary | ICD-10-CM

## 2015-09-11 DIAGNOSIS — R5382 Chronic fatigue, unspecified: Secondary | ICD-10-CM

## 2015-09-11 DIAGNOSIS — F32A Depression, unspecified: Secondary | ICD-10-CM

## 2015-09-11 DIAGNOSIS — C3490 Malignant neoplasm of unspecified part of unspecified bronchus or lung: Secondary | ICD-10-CM

## 2015-09-11 LAB — COMPREHENSIVE METABOLIC PANEL
ALT: 10 U/L (ref 0–55)
AST: 15 U/L (ref 5–34)
Albumin: 3.6 g/dL (ref 3.5–5.0)
Alkaline Phosphatase: 130 U/L (ref 40–150)
Anion Gap: 12 mEq/L — ABNORMAL HIGH (ref 3–11)
BUN: 11.1 mg/dL (ref 7.0–26.0)
CO2: 24 meq/L (ref 22–29)
Calcium: 9.3 mg/dL (ref 8.4–10.4)
Chloride: 105 mEq/L (ref 98–109)
Creatinine: 0.8 mg/dL (ref 0.6–1.1)
EGFR: 84 mL/min/{1.73_m2} — AB (ref >90)
GLUCOSE: 94 mg/dL (ref 70–140)
POTASSIUM: 4.2 meq/L (ref 3.5–5.1)
SODIUM: 141 meq/L (ref 136–145)
Total Bilirubin: <0.3 mg/dL (ref 0.20–1.20)
Total Protein: 7.7 g/dL (ref 6.4–8.3)

## 2015-09-11 LAB — CBC WITH DIFFERENTIAL/PLATELET
BASO%: 1.4 % (ref 0.0–2.0)
BASOS ABS: 0.1 10*3/uL (ref 0.0–0.1)
EOS ABS: 0.2 10*3/uL (ref 0.0–0.5)
EOS%: 2.6 % (ref 0.0–7.0)
HCT: 41.6 % (ref 34.8–46.6)
HEMOGLOBIN: 13.5 g/dL (ref 11.6–15.9)
LYMPH%: 27.7 % (ref 14.0–49.7)
MCH: 29.7 pg (ref 25.1–34.0)
MCHC: 32.4 g/dL (ref 31.5–36.0)
MCV: 91.9 fL (ref 79.5–101.0)
MONO#: 0.6 10*3/uL (ref 0.1–0.9)
MONO%: 8.5 % (ref 0.0–14.0)
NEUT%: 59.8 % (ref 38.4–76.8)
NEUTROS ABS: 4 10*3/uL (ref 1.5–6.5)
Platelets: 327 10*3/uL (ref 145–400)
RBC: 4.53 10*6/uL (ref 3.70–5.45)
RDW: 17.2 % — AB (ref 11.2–14.5)
WBC: 6.7 10*3/uL (ref 3.9–10.3)
lymph#: 1.8 10*3/uL (ref 0.9–3.3)

## 2015-09-11 LAB — TSH: TSH: 1.747 m(IU)/L (ref 0.308–3.960)

## 2015-09-11 MED ORDER — HEPARIN SOD (PORK) LOCK FLUSH 100 UNIT/ML IV SOLN
500.0000 [IU] | Freq: Once | INTRAVENOUS | Status: AC | PRN
  Filled 2015-09-11: qty 5

## 2015-09-11 MED ORDER — ALPRAZOLAM 0.5 MG PO TABS: 0.5000 mg | ORAL_TABLET | Freq: Every evening | ORAL | Status: DC | PRN

## 2015-09-11 MED ORDER — SODIUM CHLORIDE 0.9 % IV SOLN
1200.0000 mg | Freq: Once | INTRAVENOUS | Status: AC
  Administered 2015-09-11: 1200 mg via INTRAVENOUS
  Filled 2015-09-11: qty 20

## 2015-09-11 MED ORDER — SODIUM CHLORIDE 0.9 % IJ SOLN
10.0000 mL | INTRAMUSCULAR | Status: AC | PRN
  Filled 2015-09-11: qty 10

## 2015-09-11 MED ORDER — SODIUM CHLORIDE 0.9 % IV SOLN
Freq: Once | INTRAVENOUS | Status: AC
  Administered 2015-09-11: 12:00:00 via INTRAVENOUS

## 2015-09-11 NOTE — Progress Notes (Signed)
[place] [phone]   [phone]  OFFICE PROGRESS NOTE  Lanier Clam, MD [address] [place] [address]  DIAGNOSIS: Stage IV (T2a, N0, M1a) non-small cell lung cancer, adenocarcinoma presented with bilateral pulmonary lesions. This could be also consider as synchronous primary tumors.  PRIOR THERAPY:  1) Systemic chemotherapy with carboplatin for AUC of 5 and Alimta 500 MG/M2 every 3 weeks. Status post 6 cycles. 2) Treatment according to the BMS checkmate 370 clinical trial. Randomized to group A arm B1 treatment with pemetrexed (Alimta) 500 mg/m given every 3 weeks. Status post 6 cycles discontinued secondary to disease progression.   CURRENT THERAPY: Tecentriq 1200 mg IV every 3 weeks status post 5 cycles.  INTERVAL HISTORY: Heather Banks 65 y.o. female returns to the clinic today for follow-up visit. The patient is currently on treatment with immunotherapy with Tecentriq Huey Bienenstock)  Status post 5 cycles and tolerating her treatment fairly well except for several spots of macular rash on the upper and lower extremities as well as the hands and feet.  She applied over the counter hydrocortisone cream with minimal improvement. She also takes Benadryl for the itching. She denied having any significant weight loss or night sweats. She denied having any significant nausea or vomiting. She has no fever or chills. The patient denied having any significant chest pain, shortness of breath, was no cough or hemoptysis.    MEDICAL HISTORY: Past Medical History  Diagnosis Date  . COPD (chronic obstructive pulmonary disease) (HCC)     Albuterol neb as needed;Pulmicort neb daily  . GERD (gastroesophageal reflux disease)     takes Pantoprazole and Zantac daily  . Lung mass   . Pneumonia     hx of-last time about 4+yrs ago  . History of bronchitis     >5 yrs ago  . History of migraine     last one 2 wks ago  . Panic attacks    but doesn't take any meds  . Encounter for antineoplastic chemotherapy 01/21/2015  . Full code status 05/01/2015  . Adenocarcinoma of left lung, stage 4 ([facility]) 07/09/2014    ALLERGIES:  has No Known Allergies.  MEDICATIONS:  Current Outpatient Prescriptions  Medication Sig Dispense Refill  . albuterol (PROVENTIL HFA;VENTOLIN HFA) 108 (90 BASE) MCG/ACT inhaler Inhale 2 puffs into the lungs every 6 (six) hours as needed for wheezing or shortness of breath. 1 Inhaler 6  . albuterol (PROVENTIL) (2.5 MG/3ML) 0.083% nebulizer solution Take 3 mLs (2.5 mg total) by nebulization every 6 (six) hours as needed for wheezing or shortness of breath. (Patient not taking: Reported on 08/21/2015) 75 mL 3  . ALPRAZolam (XANAX) 0.5 MG tablet Take 1 tablet (0.5 mg total) by mouth at bedtime as needed for anxiety. (Patient not taking: Reported on 07/31/2015) 30 tablet 0  . aspirin-acetaminophen-caffeine (EXCEDRIN MIGRAINE) 122-482-50 MG per tablet Take 2 tablets by mouth every 6 (six) hours as needed for headache.    . diphenhydrAMINE (BENADRYL) 25 mg capsule Take 25 mg by mouth every 6 (six) hours as needed.    . Fluticasone Furoate-Vilanterol 100-25 MCG/INH AEPB Inhale 1 puff into the lungs daily. 1 each 3  . Hydrocortisone Acetate (LANACORT 10) 1 % CREA Apply topically.    . hydrocortisone cream 0.5 % Apply 1 application topically 2 (two) times daily.    Marland Kitchen lidocaine-prilocaine (EMLA) cream Apply 1 application topically as needed. (Patient taking differently: Apply 1 application topically as needed (For port-a-cath.). )  30 g 1  . mirtazapine (REMERON) 30 MG tablet Take 1 tablet (30 mg total) by mouth at bedtime. (Patient not taking: Reported on 07/31/2015) 30 tablet 0  . nicotine (NICODERM CQ) 21 mg/24hr patch Place 1 patch (21 mg total) onto the skin daily. (Patient not taking: Reported on 07/31/2015) 28 patch 0  . omeprazole (PRILOSEC) 20 MG capsule TAKE 1 CAPSULE BY MOUTH TWICE DAILY BEFORE A MEAL 60 capsule 0  .  Probiotic Product (PROBIOTIC DAILY PO) Take by mouth.    . prochlorperazine (COMPAZINE) 10 MG tablet Take 1 tablet (10 mg total) by mouth every 6 (six) hours as needed for nausea or vomiting. 30 tablet 0  . ranitidine (ZANTAC) 150 MG tablet Take 150 mg by mouth daily as needed for heartburn.     No current facility-administered medications for this visit.   Facility-Administered Medications Ordered in Other Visits  Medication Dose Route Frequency Provider Last Rate Last Dose  . sodium chloride 0.9 % injection 10 mL  10 mL Intracatheter PRN Curt Bears, MD   10 mL at 07/31/15 1635    SURGICAL HISTORY:  Past Surgical History  Procedure Laterality Date  . Abdominal hysterectomy    . Oophorectomy    . Appendectomy    . Tonsillectomy    . Video bronchoscopy with endobronchial navigation N/A 08/14/2014    Procedure: VIDEO BRONCHOSCOPY WITH ENDOBRONCHIAL NAVIGATION;  Surgeon: Rigoberto Noel, MD;  Location: [facility];  Service: Thoracic;  Laterality: N/A;  . Portacath placement  jan. 2016    REVIEW OF SYSTEMS:  A comprehensive review of systems was negative except for: Constitutional: positive for fatigue Integument/breast: positive for dryness, pruritus and rash   PHYSICAL EXAMINATION: General appearance: alert, cooperative and no distress Head: Normocephalic, without obvious abnormality, atraumatic Neck: no adenopathy, no JVD, supple, symmetrical, trachea midline and thyroid not enlarged, symmetric, no tenderness/mass/nodules Lymph nodes: Cervical, supraclavicular, and axillary nodes normal. Resp: clear to auscultation bilaterally Back: symmetric, no curvature. ROM normal. No CVA tenderness. Cardio: regular rate and rhythm, S1, S2 normal, no murmur, click, rub or gallop GI: soft, non-tender; bowel sounds normal; no masses,  no organomegaly Extremities: extremities normal, atraumatic, no cyanosis or edema Neurologic: Alert and oriented X 3, normal strength and tone. Normal symmetric  reflexes. Normal coordination and gait   skin exam:  Few macular lesions on the upper and lower extremities.     ECOG PERFORMANCE STATUS: 1 - Symptomatic but completely ambulatory  Blood pressure 159/77, pulse 81, temperature 98.6 F (37 C), temperature source Oral, resp. rate 17, height '5\' 4"'$  (1.626 m), weight 199 lb (90.266 kg), SpO2 98 %.  LABORATORY DATA: Lab Results  Component Value Date   WBC 6.7 09/11/2015   HGB 13.5 09/11/2015   HCT 41.6 09/11/2015   MCV 91.9 09/11/2015   PLT 327 09/11/2015      Chemistry      Component Value Date/Time   NA 142 08/21/2015 1121   NA 138 08/11/2014 0837   K 4.2 08/21/2015 1121   K 3.8 08/11/2014 0837   CL 103 08/11/2014 0837   CO2 22 08/21/2015 1121   CO2 23 08/11/2014 0837   BUN 12.1 08/21/2015 1121   BUN 8 08/11/2014 0837   CREATININE 0.9 08/21/2015 1121   CREATININE 0.57 08/11/2014 0837      Component Value Date/Time   CALCIUM 9.3 08/21/2015 1121   CALCIUM 9.0 08/11/2014 0837   ALKPHOS 149 08/21/2015 1121   ALKPHOS 145* 07/10/2014 0459  AST 16 08/21/2015 1121   AST 11 07/10/2014 0459   ALT 13 08/21/2015 1121   ALT 8 07/10/2014 0459   BILITOT <0.30 08/21/2015 1121   BILITOT <0.2* 07/10/2014 0459       RADIOGRAPHIC STUDIES: No results found.  ASSESSMENT AND PLAN: This is a very pleasant 65 years old white female with stage IV non-small cell lung cancer currently undergoing systemic chemotherapy with carboplatin and Alimta status post 6 cycles.  She she completed a course of maintenance chemotherapy with single agent Alimta status post 6 cycles and tolerating her treatment fairly well but this was discontinued secondary to disease progression.  The patient is currently undergoing treatment with immunotherapy with Tecentriq Huey Bienenstock) status post 5 cycles. She is tolerating her treatment fairly well except for grade 2 macular skin rash on the upper and lower extremities. I discussed with the patient again to  discontinue her treatment and consideration of treatment with high-dose steroids but she would like to finish the last cycle of her treatment before repeating imaging studies. I recommended for her to continue with her current treatment with her treatment with Tecentriq Huey Bienenstock) with cycle #6  today as a scheduled.  For the skin rash she will continue to apply hydrocortisone cream and take Benadryl for itching. For depression, she will continue on Remeron 30 mg by mouth daily at bedtime. For anxiety, she was giving a refill of Xanax. For the acid reflux, the patient will continue Prilosec 20 mg by mouth twice a day. The patient would come back for follow-up visit in 3 weeks for reevaluation after repeating CT scan of the chest, abdomen and pelvis for restaging of her disease. CODE STATUS: FULL CODE STATUS but no prolonged support if she is in a vegetative state. She was advised to call immediately if she has any concerning symptoms in the interval. The patient voices understanding of current disease status and treatment options and is in agreement with the current care plan.  All questions were answered. The patient knows to call the clinic with any problems, questions or concerns. We can certainly see the patient much sooner if necessary.  Disclaimer: This note was dictated with voice recognition software. Similar sounding words can inadvertently be transcribed and may not be corrected upon review.

## 2015-09-11 NOTE — Progress Notes (Signed)
09/11/2015 1045 BMS 370 Group A, Arm B1 The patient was seen in the exam room while waiting for Dr. Julien Nordmann.  The patient was alone.  This RN informed the patient that her research nurse, Otilio Miu, had requested this RN to re-consent patient to the BMS 370 study.  This RN asked permission of patient to review revised changes to the consent form.  The patient gave permission. This RN discussed the Revised Protocol changes for version 03 dated 25-Apr-2015 with the patient. This RN reviewed the entire consent form with the patient and pointed out the changes on the consent form pages. This RN spent approximately 20 minutes to complete the entire re-consent process. The patient was without any questions or concerns. The patient signed the consent form version 2, dated 08/09/15. A copy of the signed consent form was given to the patient. This RN thanked the patient for her continued participation on the study.

## 2015-09-12 ENCOUNTER — Telehealth: Payer: Self-pay | Admitting: Internal Medicine

## 2015-09-12 NOTE — Telephone Encounter (Signed)
left pt a message to come get contrast prior to scan

## 2015-09-12 NOTE — Telephone Encounter (Signed)
per pof to sch pt appt-appt already sch-adv pt [facility] all ot sch scan

## 2015-09-18 IMAGING — XA IR FLUORO GUIDE CV LINE*R*
1 series · 1 of 1 positions shown · non-contrast
Comparison: PET-CT -07/31/2014

INDICATION: History of lung cancer, in need of intravenous access for
chemotherapy administration.

EXAM:
IMPLANTED PORT A CATH PLACEMENT WITH ULTRASOUND AND FLUOROSCOPIC
GUIDANCE

[Series 1: care single · 1 of 1 slices shown]
[im 1/1]
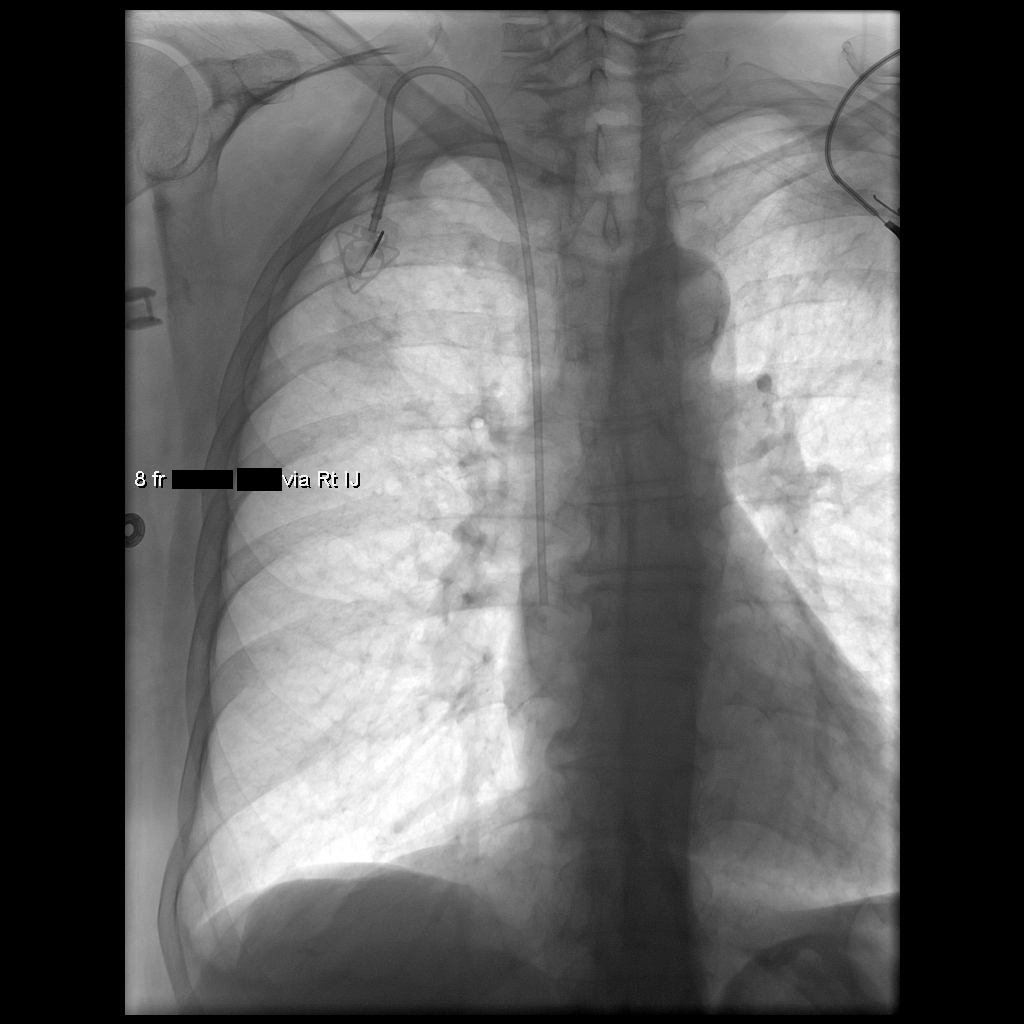

[1 of 1 positions shown; findings below may reference images not displayed]

MEDICATIONS:
Ancef 2 gm IV; The antibiotic was administered within an appropriate
time interval prior to skin puncture.

ANESTHESIA/SEDATION:
Versed 2.5 mg IV; Fentanyl 125 mcg IV;

Total Moderate Sedation Time

26  minutes.

CONTRAST:  None

FLUOROSCOPY TIME:  12 seconds (4 mGy)

COMPLICATIONS:
None immediate

PROCEDURE:
The procedure, risks, benefits, and alternatives were explained to
the patient. Questions regarding the procedure were encouraged and
answered. The patient understands and consents to the procedure.

The right neck and chest were prepped with chlorhexidine in a
sterile fashion, and a sterile drape was applied covering the
operative field. Maximum barrier sterile technique with sterile
gowns and gloves were used for the procedure. A timeout was
performed prior to the initiation of the procedure. Local anesthesia
was provided with 1% lidocaine with epinephrine.

After creating a small venotomy incision, a micropuncture kit was
utilized to access the internal jugular vein under direct, real-time
ultrasound guidance. Ultrasound image documentation was performed.
The microwire was kinked to measure appropriate catheter length.

A subcutaneous port pocket was then created along the upper chest
wall utilizing a combination of sharp and blunt dissection. The
pocket was irrigated with sterile saline. A single lumen ISP power
injectable port was chosen for placement. The 8 Fr catheter was
tunneled from the port pocket site to the venotomy incision. The
port was placed in the pocket. The external catheter was trimmed to
appropriate length. At the venotomy, an 8 Fr peel-away sheath was
placed over a guidewire under fluoroscopic guidance. The catheter
was then placed through the sheath and the sheath was removed. Final
catheter positioning was confirmed and documented with a
fluoroscopic spot radiograph. The port was accessed with Jagadish Jurado
needle, aspirated and flushed with heparinized saline.

The venotomy site was closed with an interrupted 4-0 Vicryl suture.
The port pocket incision was closed with interrupted 2-0 Vicryl
suture and the skin was opposed with a running subcuticular 4-0
Vicryl suture. Dermabond and Arens were applied to both
incisions. Dressings were placed. The patient tolerated the
procedure well without immediate post procedural complication.
FINDINGS: After catheter placement, the tip lies within the superior
cavoatrial junction. The catheter aspirates and flushes normally and
is ready for immediate use.
IMPRESSION: Successful placement of a right internal jugular approach power
injectable Port-A-Cath. The catheter is ready for immediate use.

## 2015-10-01 ENCOUNTER — Ambulatory Visit (HOSPITAL_COMMUNITY)
Admission: RE | Admit: 2015-10-01 | Discharge: 2015-10-01 | Disposition: A | Discharge status: Home / Self Care | Payer: Self-pay | Source: Ambulatory Visit | Attending: Internal Medicine | Admitting: Internal Medicine

## 2015-10-01 ENCOUNTER — Encounter (HOSPITAL_COMMUNITY): Payer: Self-pay

## 2015-10-01 DIAGNOSIS — J441 Chronic obstructive pulmonary disease with (acute) exacerbation: Secondary | ICD-10-CM | POA: Insufficient documentation

## 2015-10-01 DIAGNOSIS — Z923 Personal history of irradiation: Secondary | ICD-10-CM | POA: Insufficient documentation

## 2015-10-01 DIAGNOSIS — L270 Generalized skin eruption due to drugs and medicaments taken internally: Secondary | ICD-10-CM | POA: Insufficient documentation

## 2015-10-01 DIAGNOSIS — Z5112 Encounter for antineoplastic immunotherapy: Secondary | ICD-10-CM

## 2015-10-01 DIAGNOSIS — C3492 Malignant neoplasm of unspecified part of left bronchus or lung: Secondary | ICD-10-CM | POA: Insufficient documentation

## 2015-10-01 DIAGNOSIS — R590 Localized enlarged lymph nodes: Secondary | ICD-10-CM | POA: Insufficient documentation

## 2015-10-01 IMAGING — CT CT BIOPSY
3 series · 17 of 32 positions shown, 22 images · non-contrast
Comparison: none

CLINICAL DATA: Left upper lobe adenocarcinoma. Positive
bronchoscopic biopsy of right upper lobe lesion. Additional material
needed for molecular studies.

[Series 2: ltupperlobebx · axial · 0.74mm/px · z∈[-125,-75]mm · 6 of 15 slices shown, 11 images]
[im 3/15  soft-tissue]
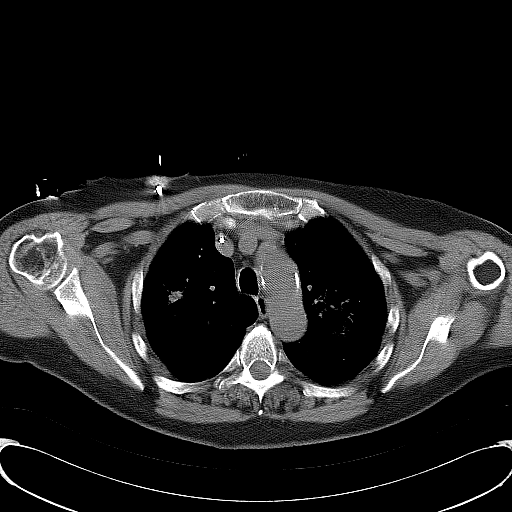
[im 3/15  bone]
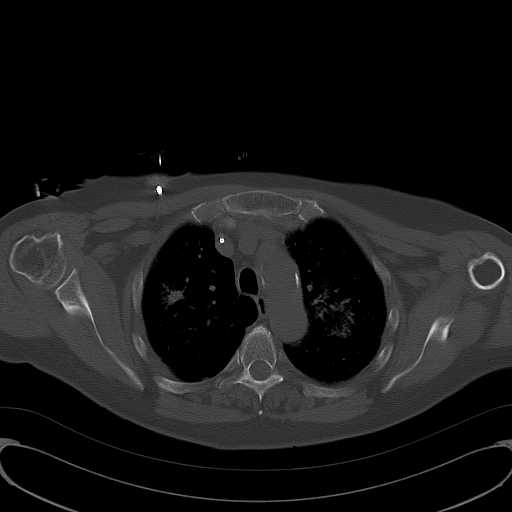
[im 5/15  soft-tissue]
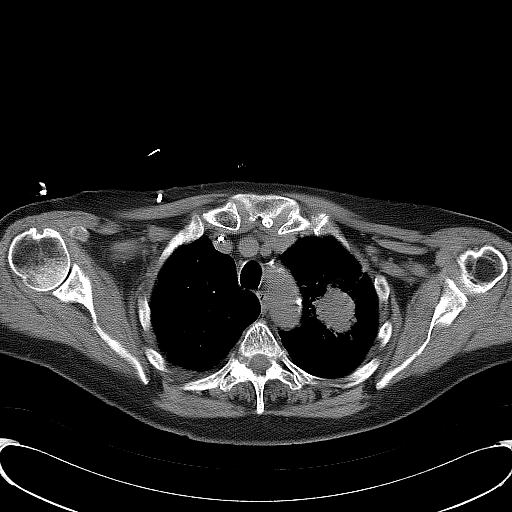
[im 7/15  soft-tissue]
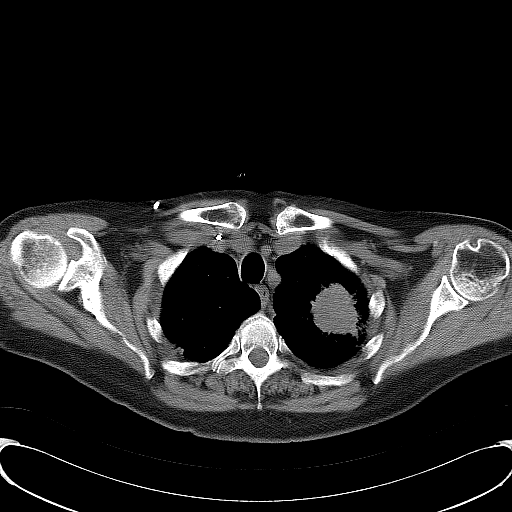
[im 7/15  lung]
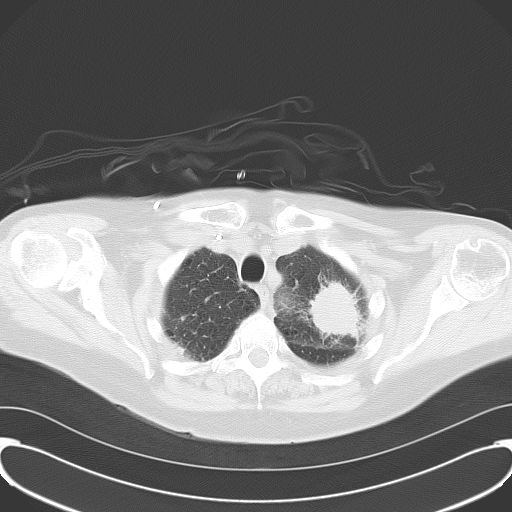
[im 9/15  soft-tissue]
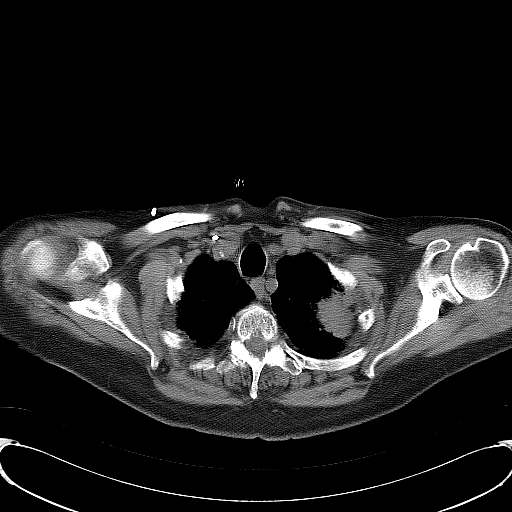
[im 9/15  lung]
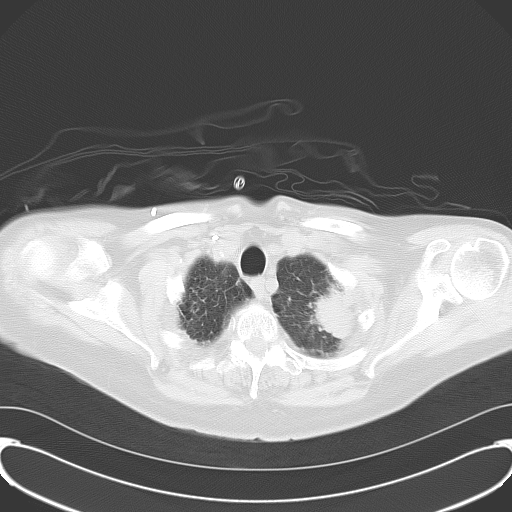
[im 11/15  soft-tissue]
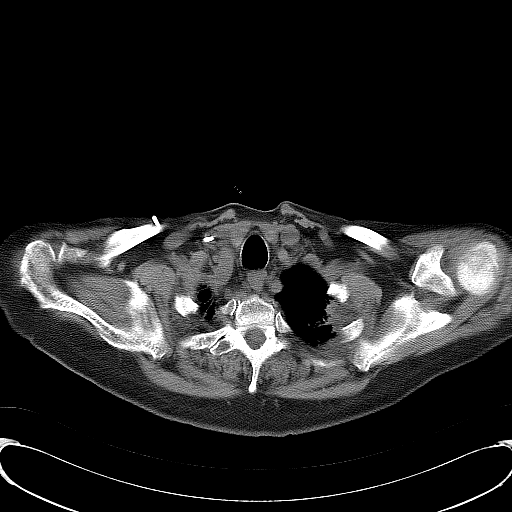
[im 11/15  lung]
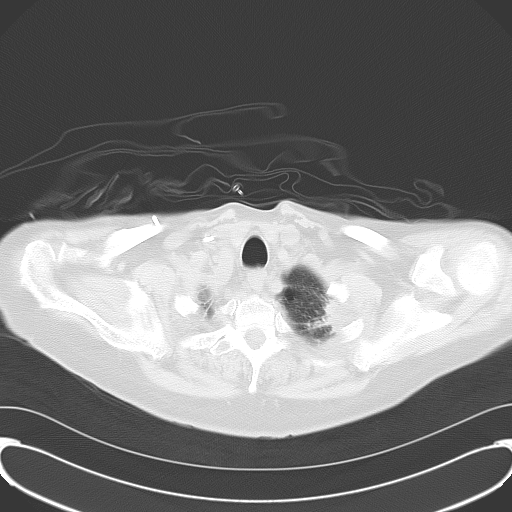
[im 13/15  soft-tissue]
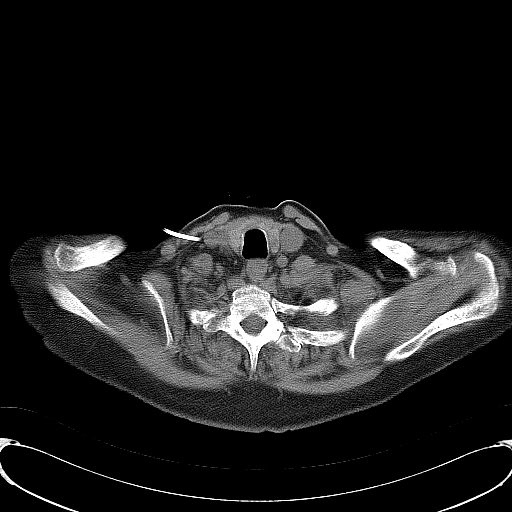
[im 13/15  lung]
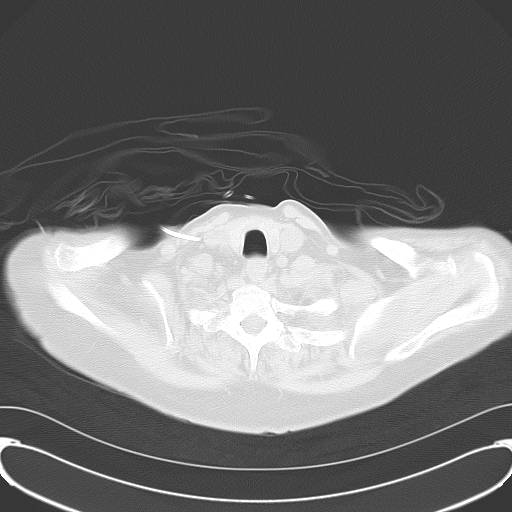

[Series 3: add scan 5.0 b70f · axial · 0.74mm/px · z∈[-59,-39]mm · 3 of 10 slices shown]
[im 3/10  soft-tissue]
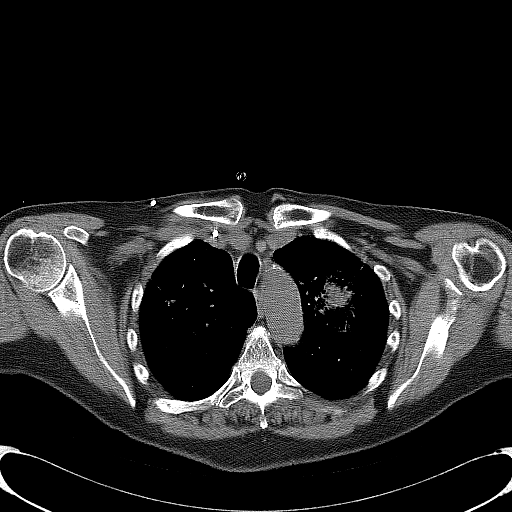
[im 5/10  soft-tissue]
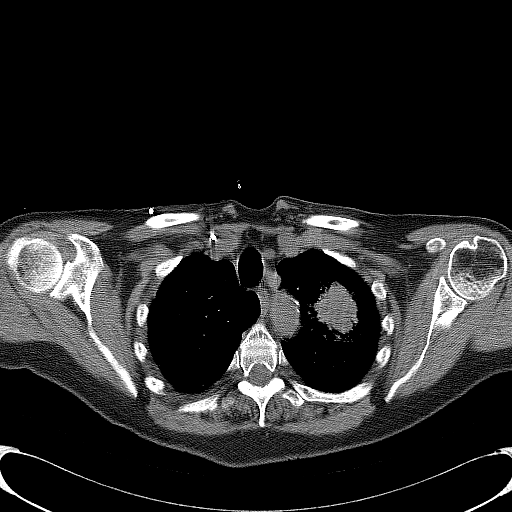
[im 7/10  soft-tissue]
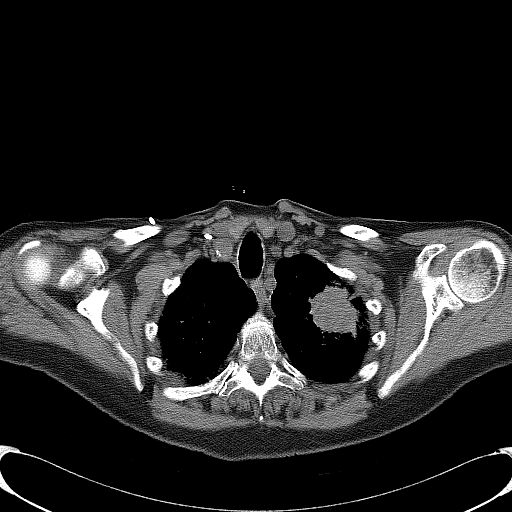

[Series 4: (hospital) 6.0 b30f · axial · 1.48mm/px · z∈[-42,-34]mm · 8 of 36 slices shown]
[im 4/36  soft-tissue]
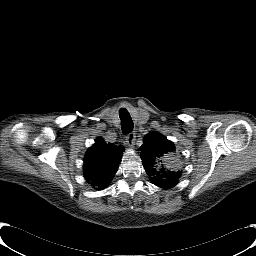
[im 8/36  soft-tissue]
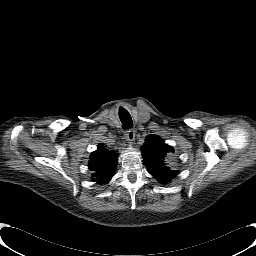
[im 12/36  soft-tissue]
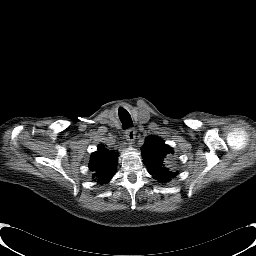
[im 16/36  soft-tissue]
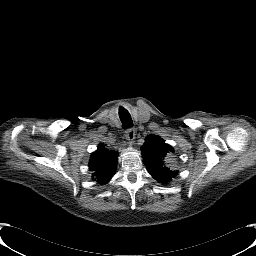
[im 20/36  soft-tissue]
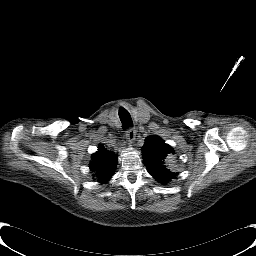
[im 24/36  soft-tissue]
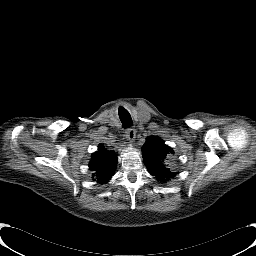
[im 28/36  soft-tissue]
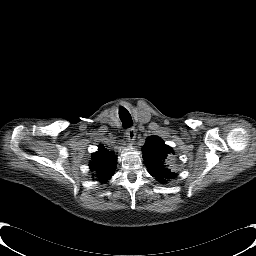
[im 32/36  soft-tissue]
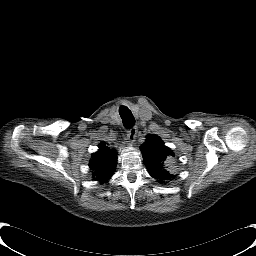

[17 of 32 positions shown; findings below may reference images not displayed]

EXAM:
CT GUIDED CORE BIOPSY OF LEFT UPPER LOBE LUNG MASS

ANESTHESIA/SEDATION:
Intravenous Fentanyl and Versed were administered as conscious
sedation during continuous cardiorespiratory monitoring by the
radiology RN, with a total moderate sedation time of 13 minutes.

PROCEDURE:
The procedure risks, benefits, and alternatives were explained to
the patient. Questions regarding the procedure were encouraged and
answered. The patient understands and consents to the procedure.

The operative field was prepped with Betadine/chlorhexidinein a
sterile fashion, and a sterile drape was applied covering the
operative field. A sterile gown and sterile gloves were used for the
procedure. Local anesthesia was provided with 1% Lidocaine.

Under CT fluoroscopic guidance, a 17 gauge trocar needle was
advanced to the margin of the lesion. Once needle tip position was
confirmed, coaxial 18-gauge core biopsy samples were obtained,
submitted in formalin to surgical pathology. The guide needle was
removed. Postprocedure scans show a small amount of regional
alveolar hemorrhage. No pneumothorax.

COMPLICATIONS:
None immediate
FINDINGS: Limited CT scans confirm bilateral upper lobe masses, left greater
than right. CT-guided core biopsy of the left upper lobe lesion was
performed.
IMPRESSION: 1. Technically successful CT-guided core biopsy of left upper lobe
lung mass.

## 2015-10-01 MED ORDER — IOHEXOL 300 MG/ML  SOLN
100.0000 mL | Freq: Once | INTRAMUSCULAR | Status: AC | PRN
  Administered 2015-10-01: 100 mL via INTRAVENOUS

## 2015-10-02 ENCOUNTER — Telehealth: Payer: Self-pay | Admitting: Internal Medicine

## 2015-10-02 ENCOUNTER — Ambulatory Visit (HOSPITAL_BASED_OUTPATIENT_CLINIC_OR_DEPARTMENT_OTHER): Payer: Self-pay | Admitting: Internal Medicine

## 2015-10-02 ENCOUNTER — Other Ambulatory Visit (HOSPITAL_BASED_OUTPATIENT_CLINIC_OR_DEPARTMENT_OTHER): Payer: Self-pay

## 2015-10-02 ENCOUNTER — Ambulatory Visit: Payer: Self-pay

## 2015-10-02 ENCOUNTER — Encounter: Payer: Self-pay | Admitting: Internal Medicine

## 2015-10-02 VITALS — BP 127/78 | HR 84 | Temp 98.2°F | Resp 18 | Ht 64.0 in | Wt 195.7 lb

## 2015-10-02 DIAGNOSIS — C3490 Malignant neoplasm of unspecified part of unspecified bronchus or lung: Secondary | ICD-10-CM

## 2015-10-02 DIAGNOSIS — F329 Major depressive disorder, single episode, unspecified: Secondary | ICD-10-CM

## 2015-10-02 DIAGNOSIS — R5382 Chronic fatigue, unspecified: Secondary | ICD-10-CM

## 2015-10-02 DIAGNOSIS — C3492 Malignant neoplasm of unspecified part of left bronchus or lung: Secondary | ICD-10-CM

## 2015-10-02 DIAGNOSIS — L270 Generalized skin eruption due to drugs and medicaments taken internally: Secondary | ICD-10-CM

## 2015-10-02 DIAGNOSIS — K219 Gastro-esophageal reflux disease without esophagitis: Secondary | ICD-10-CM

## 2015-10-02 DIAGNOSIS — Z5112 Encounter for antineoplastic immunotherapy: Secondary | ICD-10-CM

## 2015-10-02 DIAGNOSIS — F418 Other specified anxiety disorders: Secondary | ICD-10-CM

## 2015-10-02 DIAGNOSIS — C3491 Malignant neoplasm of unspecified part of right bronchus or lung: Secondary | ICD-10-CM

## 2015-10-02 LAB — CBC WITH DIFFERENTIAL/PLATELET
BASO%: 0.9 % (ref 0.0–2.0)
BASOS ABS: 0.1 10*3/uL (ref 0.0–0.1)
EOS ABS: 0.2 10*3/uL (ref 0.0–0.5)
EOS%: 2 % (ref 0.0–7.0)
HCT: 40.3 % (ref 34.8–46.6)
HEMOGLOBIN: 13.2 g/dL (ref 11.6–15.9)
LYMPH%: 22.1 % (ref 14.0–49.7)
MCH: 29.8 pg (ref 25.1–34.0)
MCHC: 32.7 g/dL (ref 31.5–36.0)
MCV: 91.1 fL (ref 79.5–101.0)
MONO#: 0.8 10*3/uL (ref 0.1–0.9)
MONO%: 10.3 % (ref 0.0–14.0)
NEUT#: 5 10*3/uL (ref 1.5–6.5)
NEUT%: 64.7 % (ref 38.4–76.8)
Platelets: 341 10*3/uL (ref 145–400)
RBC: 4.42 10*6/uL (ref 3.70–5.45)
RDW: 16.8 % — ABNORMAL HIGH (ref 11.2–14.5)
WBC: 7.7 10*3/uL (ref 3.9–10.3)
lymph#: 1.7 10*3/uL (ref 0.9–3.3)

## 2015-10-02 LAB — COMPREHENSIVE METABOLIC PANEL
ALT: 10 U/L (ref 0–55)
AST: 15 U/L (ref 5–34)
Albumin: 3.4 g/dL — ABNORMAL LOW (ref 3.5–5.0)
Alkaline Phosphatase: 137 U/L (ref 40–150)
Anion Gap: 11 mEq/L (ref 3–11)
BUN: 13.6 mg/dL (ref 7.0–26.0)
CO2: 24 meq/L (ref 22–29)
CREATININE: 0.7 mg/dL (ref 0.6–1.1)
Calcium: 9.7 mg/dL (ref 8.4–10.4)
Chloride: 107 mEq/L (ref 98–109)
EGFR: 89 mL/min/{1.73_m2} — ABNORMAL LOW (ref >90)
GLUCOSE: 89 mg/dL (ref 70–140)
Potassium: 4.4 mEq/L (ref 3.5–5.1)
SODIUM: 142 meq/L (ref 136–145)
TOTAL PROTEIN: 7.4 g/dL (ref 6.4–8.3)

## 2015-10-02 LAB — TSH: TSH: 0.89 m(IU)/L (ref 0.308–3.960)

## 2015-10-02 MED ORDER — PREDNISONE 20 MG PO TABS
ORAL_TABLET | ORAL | Status: DC
Start: 2015-10-02 — End: 2016-01-08

## 2015-10-02 NOTE — Progress Notes (Signed)
[place] [phone]   [phone]  OFFICE PROGRESS NOTE  Heather Banks., MD [place] [id number]  DIAGNOSIS: Stage IV (T2a, N0, M1a) non-small cell lung cancer, adenocarcinoma presented with bilateral pulmonary lesions. This could be also consider as synchronous primary tumors.  PRIOR THERAPY:  1) Systemic chemotherapy with carboplatin for AUC of 5 and Alimta 500 MG/M2 every 3 weeks. Status post 6 cycles. 2) Treatment according to the BMS checkmate 370 clinical trial. Randomized to group A arm B1 treatment with pemetrexed (Alimta) 500 mg/m given every 3 weeks. Status post 6 cycles discontinued secondary to disease progression. 3) Tecentriq 1200 mg IV every 3 weeks status post 6 cycles discontinued secondary to significant skin rash.   CURRENT THERAPY: None.  INTERVAL HISTORY: Heather Banks 65 y.o. female returns to the clinic today for follow-up visit. The patient is currently on treatment with immunotherapy with Tecentriq Huey Bienenstock)  Status post 6 cycles and tolerating her treatment fairly well except for several spots of macular rash on the upper and lower extremities as well as the hands and feet. There is no improvement with the topical hydrocortisone creams. She denied having any significant weight loss or night sweats. She denied having any significant nausea or vomiting. She has no fever or chills. The patient denied having any significant chest pain, shortness of breath, was no cough or hemoptysis.  She had repeat CT scan of the chest, abdomen and pelvis performed recently and she is here for evaluation and discussion of her scan results.  MEDICAL HISTORY: Past Medical History  Diagnosis Date  . COPD (chronic obstructive pulmonary disease) (HCC)     Albuterol neb as needed;Pulmicort neb daily  . GERD (gastroesophageal reflux disease)     takes Pantoprazole and Zantac daily  . Lung mass   . Pneumonia     hx  of-last time about 4+yrs ago  . History of bronchitis     >5 yrs ago  . History of migraine     last one 2 wks ago  . Panic attacks     but doesn't take any meds  . Encounter for antineoplastic chemotherapy 01/21/2015  . Full code status 05/01/2015  . Adenocarcinoma of left lung, stage 4 ([facility]) 07/09/2014    ALLERGIES:  has No Known Allergies.  MEDICATIONS:  Current Outpatient Prescriptions  Medication Sig Dispense Refill  . albuterol (PROVENTIL HFA;VENTOLIN HFA) 108 (90 BASE) MCG/ACT inhaler Inhale 2 puffs into the lungs every 6 (six) hours as needed for wheezing or shortness of breath. 1 Inhaler 6  . albuterol (PROVENTIL) (2.5 MG/3ML) 0.083% nebulizer solution Take 3 mLs (2.5 mg total) by nebulization every 6 (six) hours as needed for wheezing or shortness of breath. (Patient not taking: Reported on 08/21/2015) 75 mL 3  . ALPRAZolam (XANAX) 0.5 MG tablet Take 1 tablet (0.5 mg total) by mouth at bedtime as needed for anxiety. 30 tablet 0  . aspirin-acetaminophen-caffeine (EXCEDRIN MIGRAINE) [id number] MG per tablet Take 2 tablets by mouth every 6 (six) hours as needed for headache.    . diphenhydrAMINE (BENADRYL) 25 mg capsule Take 25 mg by mouth every 6 (six) hours as needed.    . Fluticasone Furoate-Vilanterol 100-25 MCG/INH AEPB Inhale 1 puff into the lungs daily. 1 each 3  . Hydrocortisone Acetate (LANACORT 10) 1 % CREA Apply topically.    . hydrocortisone cream 0.5 % Apply 1 application topically 2 (two) times daily.    Marland Kitchen lidocaine-prilocaine (  EMLA) cream Apply 1 application topically as needed. (Patient taking differently: Apply 1 application topically as needed (For port-a-cath.). ) 30 g 1  . mirtazapine (REMERON) 30 MG tablet Take 1 tablet (30 mg total) by mouth at bedtime. 30 tablet 0  . nicotine (NICODERM CQ) 21 mg/24hr patch Place 1 patch (21 mg total) onto the skin daily. 28 patch 0  . omeprazole (PRILOSEC) 20 MG capsule TAKE 1 CAPSULE BY MOUTH TWICE DAILY BEFORE A MEAL 60  capsule 0  . Probiotic Product (PROBIOTIC DAILY PO) Take by mouth.    . prochlorperazine (COMPAZINE) 10 MG tablet Take 1 tablet (10 mg total) by mouth every 6 (six) hours as needed for nausea or vomiting. 30 tablet 0  . ranitidine (ZANTAC) 150 MG tablet Take 150 mg by mouth daily as needed for heartburn.     No current facility-administered medications for this visit.   Facility-Administered Medications Ordered in Other Visits  Medication Dose Route Frequency Provider Last Rate Last Dose  . heparin lock flush 100 unit/mL  500 Units Intracatheter Once PRN Heather Bears, MD      . sodium chloride 0.9 % injection 10 mL  10 mL Intracatheter PRN Heather Bears, MD   10 mL at 07/31/15 1635  . sodium chloride 0.9 % injection 10 mL  10 mL Intracatheter PRN Heather Bears, MD        SURGICAL HISTORY:  Past Surgical History  Procedure Laterality Date  . Abdominal hysterectomy    . Oophorectomy    . Appendectomy    . Tonsillectomy    . Video bronchoscopy with endobronchial navigation N/A 08/14/2014    Procedure: VIDEO BRONCHOSCOPY WITH ENDOBRONCHIAL NAVIGATION;  Surgeon: Heather Noel, MD;  Location: [facility];  Service: Thoracic;  Laterality: N/A;  . Portacath placement  jan. 2016    REVIEW OF SYSTEMS:  Constitutional: positive for fatigue Eyes: negative Ears, nose, mouth, throat, and face: negative Respiratory: negative Cardiovascular: negative Gastrointestinal: negative Genitourinary:negative Integument/breast: positive for dryness, pruritus and rash Hematologic/lymphatic: negative Musculoskeletal:negative Neurological: negative Behavioral/Psych: negative Endocrine: negative Allergic/Immunologic: negative   PHYSICAL EXAMINATION: General appearance: alert, cooperative and no distress Head: Normocephalic, without obvious abnormality, atraumatic Neck: no adenopathy, no JVD, supple, symmetrical, trachea midline and thyroid not enlarged, symmetric, no tenderness/mass/nodules Lymph  nodes: Cervical, supraclavicular, and axillary nodes normal. Resp: clear to auscultation bilaterally Back: symmetric, no curvature. ROM normal. No CVA tenderness. Cardio: regular rate and rhythm, S1, S2 normal, no murmur, click, rub or gallop GI: soft, non-tender; bowel sounds normal; no masses,  no organomegaly Extremities: extremities normal, atraumatic, no cyanosis or edema Neurologic: Alert and oriented X 3, normal strength and tone. Normal symmetric reflexes. Normal coordination and gait   skin exam:  Few macular lesions on the upper and lower extremities.     ECOG PERFORMANCE STATUS: 1 - Symptomatic but completely ambulatory  Blood pressure 127/78, pulse 84, temperature 98.2 F (36.8 C), temperature source Oral, resp. rate 18, height '5\' 4"'$  (1.626 m), weight 195 lb 11.2 oz (88.769 kg), SpO2 97 %.  LABORATORY DATA: Lab Results  Component Value Date   WBC 7.7 10/02/2015   HGB 13.2 10/02/2015   HCT 40.3 10/02/2015   MCV 91.1 10/02/2015   PLT 341 10/02/2015      Chemistry      Component Value Date/Time   NA 141 09/11/2015 1016   NA 138 08/11/2014 0837   K 4.2 09/11/2015 1016   K 3.8 08/11/2014 0837   CL 103 08/11/2014 0837  CO2 24 09/11/2015 1016   CO2 23 08/11/2014 0837   BUN 11.1 09/11/2015 1016   BUN 8 08/11/2014 0837   CREATININE 0.8 09/11/2015 1016   CREATININE 0.57 08/11/2014 0837      Component Value Date/Time   CALCIUM 9.3 09/11/2015 1016   CALCIUM 9.0 08/11/2014 0837   ALKPHOS 130 09/11/2015 1016   ALKPHOS 145* 07/10/2014 0459   AST 15 09/11/2015 1016   AST 11 07/10/2014 0459   ALT 10 09/11/2015 1016   ALT 8 07/10/2014 0459   BILITOT <0.30 09/11/2015 1016   BILITOT <0.2* 07/10/2014 0459       RADIOGRAPHIC STUDIES: Ct Chest W Contrast  10/01/2015  CLINICAL DATA:  Left lung adenocarcinoma. EXAM: CT CHEST, ABDOMEN, AND PELVIS WITH CONTRAST TECHNIQUE: Multidetector CT imaging of the chest, abdomen and pelvis was performed following the standard  protocol during bolus administration of intravenous contrast. CONTRAST:  138m OMNIPAQUE IOHEXOL 300 MG/ML  SOLN COMPARISON:  07/27/2015.  05/15/2015 FINDINGS: RECIST 1.1 Target Lesions: 1. Left upper lobe mass (image 13 series 4) Stable a 5.2 x 4.6 cm compared to 5.1 x 4.7 cm previously. 2. Lobular right upper lobe nodule difficult to measure given irregular borders, but measures approximately 1.9 x 1.9 cm today compared to 2.2 x 1.8 cm previously, not substantially changed. Non-target Lesions: 1. None. CT CHEST FINDINGS Mediastinum/Lymph Nodes: Tip of the right-sided Port-A-Cath is at the SVC/ RA junction. There is no axillary lymphadenopathy. 12 mm AP window lymph node on image 17 series 2 was 9 mm short axis when I remeasure it on the prior study. 12 mm short axis left hilar lymph node seen on image 20 has increased from 10 mm previously. No right hilar lymphadenopathy. The heart size is normal. No pericardial effusion. Coronary artery calcification is noted. The esophagus has normal imaging features. Lungs/Pleura: As noted above, the dominant left upper lobe mass measures 5.2 x 4.6 cm when measured at the same level as previously when it measured 5.1 x 4.7 cm. Irregular anterior right upper lobe nodule measures 3.3 x 2.3 cm today compared to 3.5 x 2.7 cm previously. Index nodule right upper lobe is 1.9 x 1.9 cm today compared to 2.2 x 1.8 cm previously. No focal airspace consolidation. No pulmonary edema or pleural effusion. Musculoskeletal: Bone windows reveal no worrisome lytic or sclerotic osseous lesions. CT ABDOMEN PELVIS FINDINGS Hepatobiliary: No focal abnormality within the liver parenchyma. There is no evidence for gallstones, gallbladder wall thickening, or pericholecystic fluid. No intrahepatic or extrahepatic biliary dilation. Pancreas: No focal mass lesion. No dilatation of the main duct. No intraparenchymal cyst. No peripancreatic edema. Spleen: No splenomegaly. No focal mass lesion.  Adrenals/Urinary Tract: No adrenal nodule or mass. Stable 2.4 cm exophytic cyst upper pole right kidney with other scattered smaller cysts noted in the right kidney. No enhancing mass in either kidney. No evidence for hydroureter. The urinary bladder appears normal for the degree of distention. Stomach/Bowel: Stomach is nondistended. No gastric wall thickening. No evidence of outlet obstruction. Duodenum is normally positioned as is the ligament of Treitz. No small bowel wall thickening. No small bowel dilatation. The terminal ileum is normal. The appendix is not visualized, but there is no edema or inflammation in the region of the cecum. No gross colonic mass. No colonic wall thickening. No substantial diverticular change. Vascular/Lymphatic: There is abdominal aortic atherosclerosis without aneurysm. There is no gastrohepatic or hepatoduodenal ligament lymphadenopathy. No intraperitoneal or retroperitoneal lymphadenopathy. No pelvic sidewall lymphadenopathy. Reproductive: Uterus is surgically  absent. There is no adnexal mass. Other: No intraperitoneal free fluid. Musculoskeletal: Bone windows reveal no worrisome lytic or sclerotic osseous lesions. IMPRESSION: 1. No substantial change in the bilateral pulmonary lesions. 2. Slight increase in mediastinal and left hilar lymphadenopathy. 3. No evidence for metastatic disease in the abdomen or pelvis. Electronically Signed   By: Misty Stanley M.D.   On: 10/01/2015 08:44   Ct Abdomen Pelvis W Contrast  10/01/2015  CLINICAL DATA:  Left lung adenocarcinoma. EXAM: CT CHEST, ABDOMEN, AND PELVIS WITH CONTRAST TECHNIQUE: Multidetector CT imaging of the chest, abdomen and pelvis was performed following the standard protocol during bolus administration of intravenous contrast. CONTRAST:  185m OMNIPAQUE IOHEXOL 300 MG/ML  SOLN COMPARISON:  07/27/2015.  05/15/2015 FINDINGS: RECIST 1.1 Target Lesions: 1. Left upper lobe mass (image 13 series 4) Stable a 5.2 x 4.6 cm compared  to 5.1 x 4.7 cm previously. 2. Lobular right upper lobe nodule difficult to measure given irregular borders, but measures approximately 1.9 x 1.9 cm today compared to 2.2 x 1.8 cm previously, not substantially changed. Non-target Lesions: 1. None. CT CHEST FINDINGS Mediastinum/Lymph Nodes: Tip of the right-sided Port-A-Cath is at the SVC/ RA junction. There is no axillary lymphadenopathy. 12 mm AP window lymph node on image 17 series 2 was 9 mm short axis when I remeasure it on the prior study. 12 mm short axis left hilar lymph node seen on image 20 has increased from 10 mm previously. No right hilar lymphadenopathy. The heart size is normal. No pericardial effusion. Coronary artery calcification is noted. The esophagus has normal imaging features. Lungs/Pleura: As noted above, the dominant left upper lobe mass measures 5.2 x 4.6 cm when measured at the same level as previously when it measured 5.1 x 4.7 cm. Irregular anterior right upper lobe nodule measures 3.3 x 2.3 cm today compared to 3.5 x 2.7 cm previously. Index nodule right upper lobe is 1.9 x 1.9 cm today compared to 2.2 x 1.8 cm previously. No focal airspace consolidation. No pulmonary edema or pleural effusion. Musculoskeletal: Bone windows reveal no worrisome lytic or sclerotic osseous lesions. CT ABDOMEN PELVIS FINDINGS Hepatobiliary: No focal abnormality within the liver parenchyma. There is no evidence for gallstones, gallbladder wall thickening, or pericholecystic fluid. No intrahepatic or extrahepatic biliary dilation. Pancreas: No focal mass lesion. No dilatation of the main duct. No intraparenchymal cyst. No peripancreatic edema. Spleen: No splenomegaly. No focal mass lesion. Adrenals/Urinary Tract: No adrenal nodule or mass. Stable 2.4 cm exophytic cyst upper pole right kidney with other scattered smaller cysts noted in the right kidney. No enhancing mass in either kidney. No evidence for hydroureter. The urinary bladder appears normal for the  degree of distention. Stomach/Bowel: Stomach is nondistended. No gastric wall thickening. No evidence of outlet obstruction. Duodenum is normally positioned as is the ligament of Treitz. No small bowel wall thickening. No small bowel dilatation. The terminal ileum is normal. The appendix is not visualized, but there is no edema or inflammation in the region of the cecum. No gross colonic mass. No colonic wall thickening. No substantial diverticular change. Vascular/Lymphatic: There is abdominal aortic atherosclerosis without aneurysm. There is no gastrohepatic or hepatoduodenal ligament lymphadenopathy. No intraperitoneal or retroperitoneal lymphadenopathy. No pelvic sidewall lymphadenopathy. Reproductive: Uterus is surgically absent. There is no adnexal mass. Other: No intraperitoneal free fluid. Musculoskeletal: Bone windows reveal no worrisome lytic or sclerotic osseous lesions. IMPRESSION: 1. No substantial change in the bilateral pulmonary lesions. 2. Slight increase in mediastinal and left hilar lymphadenopathy.  3. No evidence for metastatic disease in the abdomen or pelvis. Electronically Signed   By: Misty Stanley M.D.   On: 10/01/2015 08:44    ASSESSMENT AND PLAN: This is a very pleasant 65 years old white female with stage IV non-small cell lung cancer currently undergoing systemic chemotherapy with carboplatin and Alimta status post 6 cycles.  She she completed a course of maintenance chemotherapy with single agent Alimta status post 6 cycles and tolerating her treatment fairly well but this was discontinued secondary to disease progression.  The patient is currently undergoing treatment with immunotherapy with Tecentriq Huey Bienenstock) status post 6 cycles. She is tolerating her treatment fairly well except for grade 2 macular skin rash on the upper and lower extremities. The recent CT scan of the chest, abdomen and pelvis showed no significant disease progression except for slight increase in the  mediastinal and left hilar adenopathy. I discussed the scan results with the patient today. I recommended for her to hold her treatment with immunotherapy for now secondary to the significant skin rash. I will start the patient on a tapered dose of prednisone starting with 80 mg by mouth daily for 2 weeks followed by 60 mg by mouth daily for 1 week followed by 4 mg by mouth daily for 1 week followed by 20 mg by mouth daily for 1 week followed by 10 mg by mouth daily for 1 week followed by 10 mg every other day for one week and then stop. I will also referred the patient to radiation oncology for consideration of palliative radiotherapy to the large left upper lobe lung mass in addition to the mediastinal lymphadenopathy. I will arrange for the patient to come back for follow-up visit in 3 months for reevaluation with repeat CT scan of the chest, abdomen and pelvis for restaging of her disease. For depression, she will continue on Remeron 30 mg by mouth daily at bedtime. For anxiety, she will continue on Xanax. For the acid reflux, the patient will continue Prilosec 20 mg by mouth twice a day. CODE STATUS: FULL CODE STATUS but no prolonged support if she is in a vegetative state. She was advised to call immediately if she has any concerning symptoms in the interval. The patient voices understanding of current disease status and treatment options and is in agreement with the current care plan.  All questions were answered. The patient knows to call the clinic with any problems, questions or concerns. We can certainly see the patient much sooner if necessary.  Disclaimer: This note was dictated with voice recognition software. Similar sounding words can inadvertently be transcribed and may not be corrected upon review.

## 2015-10-02 NOTE — Telephone Encounter (Signed)
Gave and printed appt sched and avs for pt for may °

## 2015-10-08 ENCOUNTER — Encounter: Payer: Self-pay | Admitting: Adult Health

## 2015-10-08 ENCOUNTER — Ambulatory Visit (INDEPENDENT_AMBULATORY_CARE_PROVIDER_SITE_OTHER): Payer: Self-pay | Admitting: Adult Health

## 2015-10-08 VITALS — HR 80 | Temp 97.7°F | Ht 65.0 in | Wt 196.0 lb

## 2015-10-08 DIAGNOSIS — J449 Chronic obstructive pulmonary disease, unspecified: Secondary | ICD-10-CM

## 2015-10-08 DIAGNOSIS — C3492 Malignant neoplasm of unspecified part of left bronchus or lung: Secondary | ICD-10-CM

## 2015-10-08 NOTE — Progress Notes (Signed)
Thoracic Location of Tumor / Histology: Left upper Lobe Lung (Non-small cell carcinoma) Stage IV (T2a,NO,M1a)  Patient presented months ago with symptoms of:   Biopsies of (if applicable) [id number]:09/73/53 ENB guided BX LUL-neg, RUL 2 targets dhowed Adenocarcinoma, felt to be synchronous tumors,    Diagnosis: Dr. Kara Mead, MD 08/14/14 :1. Lung, biopsy, left upper lobe - BENIGN LUNG TISSUE. - NEGATIVE FOR ATYPIA OR MALIGNANCY. 2. Lung, biopsy, right upper lobe - ADENOCARCINOMA, SEE COMMENT. 3. Lung, biopsy, right upper lobe - POORLY DIFFERENTIATED ADENOCARCINOMA, SEE COMMENT. Microscopic Comment Diagnosis TRANSBRONCHIAL FINE NEEDLE ASPIRATION: SPECIMEN B NAVIGATION, RIGHT UPPER LOBE BRUSHINGS (SPECIMEN 2 OF 6 COLLECTED 08/14/2014) MALIGNANT CELLS PRESENT SEE COMMENT COMMENT: THE MALIGNANT CELLS CONSISTENT WITH NON-SMALL CELL CARCINOMA. PLEASE SEE THE CORRESPONDING BIOPSY FOR FURTHER CHARACTERIZATION OF THE MALIGNANT CELLS. THERE IS NO CELL BLOCK AVAILABLE FOR ANCILLARY TUMOR TESTING. Specimen Clinical Information Bilateral lung masses  Tobacco/Marijuana/Snuff/ETOH GDJ:MEQAST 2-3 cigarettes daily, x 44 years, no alcohol in 7 years,   Past/Anticipated interventions by cardiothoracic surgery, if MHD:QQIW 10/02/15 Past/Anticipated interventions by medical oncology, if any:     Dr. Julien Nordmann, note 10/02/15:PRIOR THERAPY:  1) Systemic chemotherapy with carboplatin for AUC of 5 and Alimta 500 MG/M2 every 3 weeks. Status post 6 cycles. 2) Treatment according to the BMS checkmate 370 clinical trial. Randomized to group A arm B1 treatment with pemetrexed (Alimta) 500 mg/m given every 3 weeks. Status post 6 cycles discontinued secondary to disease progression. 3) Tecentriq 1200 mg IV every 3 weeks status post 6 cycles discontinued secondary to significant skin rash.  CURRENT THERAPY: None.Referral for palliative radiation to Left upper lobe and mediastinal lymphadenopathy   Signs/Symptoms:  Macular  Rash on upper and lower extremities as well as   hands feet, better since on prednisone pack    Weight changes, if any: No  Respiratory complaints, if any:  SOB occasionally with moderate exertion, has dry cough  Hemoptysis, if any:NO  Pain issues, if any:   No  SAFETY ISSUES:  Prior radiation? NO  Pacemaker/ICD?  NO  Possible current pregnancy? NO  Is the patient on methotrexate? NO  Current Complaints / other details:Widowed,  COPD/Pneumonia,Panic attacks, doesn't take any meds Father COPD   Allergies:NKA BP 124/77 mmHg  Pulse 89  Temp(Src) 98.5 F (36.9 C) (Oral)  Resp 20  Ht 5' 5.5" (1.664 m)  Wt 197 lb 12.8 oz (89.721 kg)  BMI 32.40 kg/m2  SpO2 98%  Wt Readings from Last 3 Encounters:  10/10/15 197 lb 12.8 oz (89.721 kg)  10/08/15 196 lb (88.905 kg)  10/02/15 195 lb 11.2 oz (88.769 kg)

## 2015-10-08 NOTE — Patient Instructions (Signed)
Continue on BREO  Follow up with Oncology as planned .  Work on not smoking .  Follow up Dr. Elsworth Soho  In 4 months and As needed

## 2015-10-08 NOTE — Progress Notes (Signed)
   Subjective:    Patient ID: Heather Banks, female    DOB: 1951-07-14, 65 y.o.   MRN: [id number]  HPI  65 yo smoker with COPD and adenocarcinoma lung, stage IV.  Admitted 06/1314 for COPD exacerbation.  CT showed bilateral upper lobe lung mass w/ minmally enlarged hilar and mediastinal nodes, hypermetabolic on PET She was discharged on oxygen at 3 L. PFT showed an FEV1 at 46%, ratio of 59. Diffusing capacity was decreased at 56% , FVC was 60%  Underwent ENB guided biopsy ,  LUL bx was neg, RUL-2 targets showed adenoCA- felt to be synchronous tumors   Had disease progression on chemoRx x 6 cycles Started on immunotherapy q 3 wks Feels tired all the time Smokes 2-4 cigs/d    CT chest 04/2015 showed a slight increase in the size of lesions compared to 02/2015     10/08/2015 Followup : COPD and Lung cancer  Pt returns for a  4 month follow up. Pt c/o SOB with activitiy, chest congestion, sinus pressure/drainage, prod cough with clear mucus. Denies any wheezing, fever, nausea or vomiting.   CT chest in Dec 2016 with increased LUL mass , no sign change in RUL nodule . NO metastatic dz in abd /pelvis.  Has upcoming ov with XRT this week.  She denies hemoptysis , chest pain, orthopnea or edema .  Remains on BREO .  Has cut back on smoking, cessation encouarged.  Review of Systems neg for any significant sore throat, dysphagia, itching, sneezing, nasal congestion or excess/ purulent secretions, fever, chills, sweats, unintended wt loss, pleuritic or exertional cp, hempoptysis, orthopnea pnd or change in chronic leg swelling. Also denies presyncope, palpitations, heartburn, abdominal pain, nausea, vomiting, diarrhea or change in bowel or urinary habits, dysuria,hematuria, rash, arthralgias, visual complaints, headache, numbness weakness or ataxia.     Objective:   Physical Exam  Gen. Pleasant, eldelry , in no distress ENT - no lesions, no post nasal drip Neck: No JVD, no thyromegaly, no  carotid bruits Lungs: no use of accessory muscles, no dullness to percussion, clear without rales or rhonchi  Cardiovascular: Rhythm regular, heart sounds  normal, no murmurs or gallops, no peripheral edema Musculoskeletal: No deformities, no cyanosis or clubbing    Omero Kowal NP-C  [facility]  10/08/15     Assessment & Plan:

## 2015-10-10 ENCOUNTER — Ambulatory Visit
Admission: RE | Admit: 2015-10-10 | Discharge: 2015-10-10 | Disposition: A | Discharge status: Home / Self Care | Payer: Self-pay | Source: Ambulatory Visit | Attending: Radiation Oncology | Admitting: Radiation Oncology

## 2015-10-10 ENCOUNTER — Encounter: Payer: Self-pay | Admitting: Radiation Oncology

## 2015-10-10 ENCOUNTER — Other Ambulatory Visit: Payer: Self-pay | Admitting: Medical Oncology

## 2015-10-10 VITALS — BP 124/77 | HR 89 | Temp 98.5°F | Resp 20 | Ht 65.5 in | Wt 197.8 lb

## 2015-10-10 DIAGNOSIS — K219 Gastro-esophageal reflux disease without esophagitis: Secondary | ICD-10-CM | POA: Insufficient documentation

## 2015-10-10 DIAGNOSIS — C3412 Malignant neoplasm of upper lobe, left bronchus or lung: Secondary | ICD-10-CM | POA: Insufficient documentation

## 2015-10-10 DIAGNOSIS — Z510 Encounter for antineoplastic radiation therapy: Secondary | ICD-10-CM | POA: Insufficient documentation

## 2015-10-10 DIAGNOSIS — F17210 Nicotine dependence, cigarettes, uncomplicated: Secondary | ICD-10-CM | POA: Insufficient documentation

## 2015-10-10 DIAGNOSIS — Z95828 Presence of other vascular implants and grafts: Secondary | ICD-10-CM

## 2015-10-10 DIAGNOSIS — J449 Chronic obstructive pulmonary disease, unspecified: Secondary | ICD-10-CM | POA: Insufficient documentation

## 2015-10-10 DIAGNOSIS — C3492 Malignant neoplasm of unspecified part of left bronchus or lung: Secondary | ICD-10-CM

## 2015-10-10 DIAGNOSIS — C3411 Malignant neoplasm of upper lobe, right bronchus or lung: Secondary | ICD-10-CM

## 2015-10-10 MED ORDER — LIDOCAINE-PRILOCAINE 2.5-2.5 % EX CREA: 1.0000 "application " | TOPICAL_CREAM | CUTANEOUS | Status: DC | PRN

## 2015-10-10 NOTE — Progress Notes (Signed)
Radiation Oncology         [phone] ________________________________  Name: Heather Banks MRN: [id number]  Date: 10/10/2015  DOB: 20-Apr-1951  ME:[id number],[id number] K., MD  Curt Bears, MD     REFERRING PHYSICIAN: Curt Bears, MD   DIAGNOSIS: The encounter diagnosis was Primary cancer of left upper lobe of lung ([place]).  Stage IV (T2a, N0, M1a) non-small cell lung cancer, adenocarcinoma originally presented with bilateral nodules.  HISTORY OF PRESENT ILLNESS:[name] is a 65 y.o. female who is seen for an initial consultation visit regarding the patient's diagnosis of stage IV non-small cell lung cancer. The patient was seen by Dr. Elsworth Soho and on 08/14/14, the patient underwent bronchscopy and biopsies of the left and right upper lobes. Biopsies of the left lung showed benign tissue, but biopsies of bilateral upper lobe lesions revealed adenocarcinoma. She has been on systemic therapy with cytotoxic chemotherapy, as well as the clinical trial, and most recently immunotherapy. Unfortunately she developed grade 2 skin toxicity, and has been on a steroid taper. been on a steroid taper.   Recent CT scan of the chest/abd/pelvis on 10/01/15 revealed increase in the mediastinal and left hilar lymphadenopathy, as well as a 5.2 x 4.6 cm left upper lobe nodul she is seen today by Dr. Lisbeth Renshaw for consideration of palliative radiotherapy to the left upper lobe lesion with as well as her regional involved lymph nodes.  PREVIOUS RADIATION THERAPY: No  PAST MEDICAL HISTORY:  Past Medical History  Diagnosis Date  . COPD (chronic obstructive pulmonary disease) (HCC)     Albuterol neb as needed;Pulmicort neb daily  . GERD (gastroesophageal reflux disease)     takes Pantoprazole and Zantac daily  . Lung mass   . Pneumonia     hx of-last time about 4+yrs ago  . History of bronchitis     >5 yrs ago  . History of migraine     last one 2 wks ago  . Panic attacks     but doesn't take any meds  .  Encounter for antineoplastic chemotherapy 01/21/2015  . Full code status 05/01/2015  . Adenocarcinoma of left lung, stage 4 ([place]) 07/09/2014    PAST SURGICAL HISTORY: Past Surgical History  Procedure Laterality Date  . Abdominal hysterectomy    . Oophorectomy    . Appendectomy    . Tonsillectomy    . Video bronchoscopy with endobronchial navigation N/A 08/14/2014    Procedure: VIDEO BRONCHOSCOPY WITH ENDOBRONCHIAL NAVIGATION;  Surgeon: Rigoberto Noel, MD;  Location: [facility];  Service: Thoracic;  Laterality: N/A;  . Portacath placement  jan. 2016     FAMILY HISTORY: family history includes COPD in her father.   SOCIAL HISTORY:  reports that she has been smoking Cigarettes.  She has been smoking about 0.00 packs per day for the past 44 years. She has never used smokeless tobacco. She reports that she does not drink alcohol or use illicit drugs.she states that she smokes 2-3 cigarettes per day. 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 She lives in [place] but when on active treatment, states her mother in [place].    ALLERGIES: Review of patient's allergies indicates no known allergies.   MEDICATIONS:  Current Outpatient Prescriptions  Medication Sig Dispense Refill  . albuterol (PROVENTIL HFA;VENTOLIN HFA) 108 (90 BASE) MCG/ACT inhaler Inhale 2 puffs into the lungs every 6 (six) hours as needed for wheezing or shortness of breath. 1 Inhaler 6  . albuterol (PROVENTIL) (2.5  MG/3ML) 0.083% nebulizer solution Take 3 mLs (2.5 mg total) by nebulization every 6 (six) hours as needed for wheezing or shortness of breath. 75 mL 3  . ALPRAZolam (XANAX) 0.5 MG tablet Take 1 tablet (0.5 mg total) by mouth at bedtime as needed for anxiety. 30 tablet 0  . aspirin-acetaminophen-caffeine (EXCEDRIN MIGRAINE) 130-865-78 MG per tablet Take 2 tablets by mouth every 6 (six) hours as needed for headache.    . Fluticasone Furoate-Vilanterol 100-25 MCG/INH AEPB Inhale 1  puff into the lungs daily. 1 each 3  . Hydrocortisone Acetate (LANACORT 10) 1 % CREA Apply topically.    . hydrocortisone cream 0.5 % Apply 1 application topically 2 (two) times daily.    . mirtazapine (REMERON) 30 MG tablet Take 1 tablet (30 mg total) by mouth at bedtime. 30 tablet 0  . omeprazole (PRILOSEC) 20 MG capsule TAKE 1 CAPSULE BY MOUTH TWICE DAILY BEFORE A MEAL 60 capsule 0  . predniSONE (DELTASONE) 20 MG tablet 4 tab po qd x 2 weeks, 3 tab daily x 1 week, 2 tab daily X 1 week, one tab daily X one wee, 1/2 tab daily X one week. 1/2 tab every other day X one week, then stop 100 tablet 0  . Probiotic Product (PROBIOTIC DAILY PO) Take by mouth.    . prochlorperazine (COMPAZINE) 10 MG tablet Take 1 tablet (10 mg total) by mouth every 6 (six) hours as needed for nausea or vomiting. 30 tablet 0  . ranitidine (ZANTAC) 150 MG tablet Take 150 mg by mouth daily as needed for heartburn.    . diphenhydrAMINE (BENADRYL) 25 mg capsule Take 25 mg by mouth every 6 (six) hours as needed. Reported on 10/10/2015    . lidocaine-prilocaine (EMLA) cream Apply 1 application topically as needed. Apply 1-2 hours prior to IV treatment 30 g 1  . nicotine (NICODERM CQ) 21 mg/24hr patch Place 1 patch (21 mg total) onto the skin daily. (Patient not taking: Reported on 10/10/2015) 28 patch 0   No current facility-administered medications for this encounter.   Facility-Administered Medications Ordered in Other Encounters  Medication Dose Route Frequency Provider Last Rate Last Dose  . heparin lock flush 100 unit/mL  500 Units Intracatheter Once PRN Curt Bears, MD      . sodium chloride 0.9 % injection 10 mL  10 mL Intracatheter PRN Curt Bears, MD   10 mL at 07/31/15 1635  . sodium chloride 0.9 % injection 10 mL  10 mL Intracatheter PRN Curt Bears, MD         REVIEW OF SYSTEM: On review of systems, the patient reports that overall she is doing very well. She states that she has not experiencing  shortness of breath, fevers or chills. She denies any night sweats or unintended weight changes. She has noticed postnasal drip and attributes this to allergies and denies any hemoptysis. She denies any abdominal pain, nausea, vomiting, increasing fatigue, bowel or bladder dysfunction. A complete review of systems is obtained and is otherwise negative.  PHYSICAL EXAM:  height is 5' 5.5" (1.664 m) and weight is 197 lb 12.8 oz (89.721 kg). Her oral temperature is 98.5 F (36.9 C). Her blood pressure is 124/77 and her pulse is 89. Her respiration is 20 and oxygen saturation is 98%.   Pain scale 0/10 In general this is a well appearing Caucasian female in no acute distress. She is alert and oriented x4 and appropriate throughout the examination. HEENT reveals that the patient is normocephalic, atraumatic.  EOMs are intact. PERRLA. Skin is intact without any evidence of gross lesions. Cardiovascular exam reveals a regular rate and rhythm, no clicks rubs or murmurs are auscultated. Chest is clear to auscultation bilaterally. Lymphatic assessment is performed and does not reveal any adenopathy in the cervical, supraclavicular, axillary, or inguinal chains. Abdomen has active bowel sounds in all quadrants and is intact. The abdomen is soft, non tender, non distended. Lower extremities are negative for pretibial pitting edema, deep calf tenderness, cyanosis or clubbing.   ECOG = 0  0 - Asymptomatic (Fully active, able to carry on all predisease activities without restriction)  1 - Symptomatic but completely ambulatory (Restricted in physically strenuous activity but ambulatory and able to carry out work of a light or sedentary nature. For example, light housework, office work)  2 - Symptomatic, <50% in bed during the day (Ambulatory and capable of all self care but unable to carry out any work activities. Up and about more than 50% of waking hours)  3 - Symptomatic, >50% in bed, but not bedbound (Capable of  only limited self-care, confined to bed or chair 50% or more of waking hours)  4 - Bedbound (Completely disabled. Cannot carry on any self-care. Totally confined to bed or chair)  5 - Death   Eustace Pen MM, Creech RH, Tormey DC, et al. [phone]). "Toxicity and response criteria of the [facility]". [place] Oncol. 5 (6): 649-55    LABORATORY DATA:  Lab Results  Component Value Date   WBC 7.7 10/02/2015   HGB 13.2 10/02/2015   HCT 40.3 10/02/2015   MCV 91.1 10/02/2015   PLT 341 10/02/2015   Lab Results  Component Value Date   NA 142 10/02/2015   K 4.4 10/02/2015   CL 103 08/11/2014   CO2 24 10/02/2015   Lab Results  Component Value Date   ALT 10 10/02/2015   AST 15 10/02/2015   ALKPHOS 137 10/02/2015   BILITOT <0.2 10/02/2015      RADIOGRAPHY: Ct Chest W Contrast  10/01/2015  CLINICAL DATA:  Left lung adenocarcinoma. EXAM: CT CHEST, ABDOMEN, AND PELVIS WITH CONTRAST TECHNIQUE: Multidetector CT imaging of the chest, abdomen and pelvis was performed following the standard protocol during bolus administration of intravenous contrast. CONTRAST:  175m OMNIPAQUE IOHEXOL 300 MG/ML  SOLN COMPARISON:  07/27/2015.  05/15/2015 FINDINGS: RECIST 1.1 Target Lesions: 1. Left upper lobe mass (image 13 series 4) Stable a 5.2 x 4.6 cm compared to 5.1 x 4.7 cm previously. 2. Lobular right upper lobe nodule difficult to measure given irregular borders, but measures approximately 1.9 x 1.9 cm today compared to 2.2 x 1.8 cm previously, not substantially changed. Non-target Lesions: 1. None. CT CHEST FINDINGS Mediastinum/Lymph Nodes: Tip of the right-sided Port-A-Cath is at the SVC/ RA junction. There is no axillary lymphadenopathy. 12 mm AP window lymph node on image 17 series 2 was 9 mm short axis when I remeasure it on the prior study. 12 mm short axis left hilar lymph node seen on image 20 has increased from 10 mm previously. No right hilar lymphadenopathy. The heart size is normal.  No pericardial effusion. Coronary artery calcification is noted. The esophagus has normal imaging features. Lungs/Pleura: As noted above, the dominant left upper lobe mass measures 5.2 x 4.6 cm when measured at the same level as previously when it measured 5.1 x 4.7 cm. Irregular anterior right upper lobe nodule measures 3.3 x 2.3 cm today compared to 3.5 x 2.7 cm previously.  Index nodule right upper lobe is 1.9 x 1.9 cm today compared to 2.2 x 1.8 cm previously. No focal airspace consolidation. No pulmonary edema or pleural effusion. Musculoskeletal: Bone windows reveal no worrisome lytic or sclerotic osseous lesions. CT ABDOMEN PELVIS FINDINGS Hepatobiliary: No focal abnormality within the liver parenchyma. There is no evidence for gallstones, gallbladder wall thickening, or pericholecystic fluid. No intrahepatic or extrahepatic biliary dilation. Pancreas: No focal mass lesion. No dilatation of the main duct. No intraparenchymal cyst. No peripancreatic edema. Spleen: No splenomegaly. No focal mass lesion. Adrenals/Urinary Tract: No adrenal nodule or mass. Stable 2.4 cm exophytic cyst upper pole right kidney with other scattered smaller cysts noted in the right kidney. No enhancing mass in either kidney. No evidence for hydroureter. The urinary bladder appears normal for the degree of distention. Stomach/Bowel: Stomach is nondistended. No gastric wall thickening. No evidence of outlet obstruction. Duodenum is normally positioned as is the ligament of Treitz. No small bowel wall thickening. No small bowel dilatation. The terminal ileum is normal. The appendix is not visualized, but there is no edema or inflammation in the region of the cecum. No gross colonic mass. No colonic wall thickening. No substantial diverticular change. Vascular/Lymphatic: There is abdominal aortic atherosclerosis without aneurysm. There is no gastrohepatic or hepatoduodenal ligament lymphadenopathy. No intraperitoneal or retroperitoneal  lymphadenopathy. No pelvic sidewall lymphadenopathy. Reproductive: Uterus is surgically absent. There is no adnexal mass. Other: No intraperitoneal free fluid. Musculoskeletal: Bone windows reveal no worrisome lytic or sclerotic osseous lesions. IMPRESSION: 1. No substantial change in the bilateral pulmonary lesions. 2. Slight increase in mediastinal and left hilar lymphadenopathy. 3. No evidence for metastatic disease in the abdomen or pelvis. Electronically Signed   By: Misty Stanley M.D.   On: 10/01/2015 08:44   Ct Abdomen Pelvis W Contrast  10/01/2015  CLINICAL DATA:  Left lung adenocarcinoma. EXAM: CT CHEST, ABDOMEN, AND PELVIS WITH CONTRAST TECHNIQUE: Multidetector CT imaging of the chest, abdomen and pelvis was performed following the standard protocol during bolus administration of intravenous contrast. CONTRAST:  160m OMNIPAQUE IOHEXOL 300 MG/ML  SOLN COMPARISON:  07/27/2015.  05/15/2015 FINDINGS: RECIST 1.1 Target Lesions: 1. Left upper lobe mass (image 13 series 4) Stable a 5.2 x 4.6 cm compared to 5.1 x 4.7 cm previously. 2. Lobular right upper lobe nodule difficult to measure given irregular borders, but measures approximately 1.9 x 1.9 cm today compared to 2.2 x 1.8 cm previously, not substantially changed. Non-target Lesions: 1. None. CT CHEST FINDINGS Mediastinum/Lymph Nodes: Tip of the right-sided Port-A-Cath is at the SVC/ RA junction. There is no axillary lymphadenopathy. 12 mm AP window lymph node on image 17 series 2 was 9 mm short axis when I remeasure it on the prior study. 12 mm short axis left hilar lymph node seen on image 20 has increased from 10 mm previously. No right hilar lymphadenopathy. The heart size is normal. No pericardial effusion. Coronary artery calcification is noted. The esophagus has normal imaging features. Lungs/Pleura: As noted above, the dominant left upper lobe mass measures 5.2 x 4.6 cm when measured at the same level as previously when it measured 5.1 x 4.7 cm.  Irregular anterior right upper lobe nodule measures 3.3 x 2.3 cm today compared to 3.5 x 2.7 cm previously. Index nodule right upper lobe is 1.9 x 1.9 cm today compared to 2.2 x 1.8 cm previously. No focal airspace consolidation. No pulmonary edema or pleural effusion. Musculoskeletal: Bone windows reveal no worrisome lytic or sclerotic osseous lesions. CT ABDOMEN  PELVIS FINDINGS Hepatobiliary: No focal abnormality within the liver parenchyma. There is no evidence for gallstones, gallbladder wall thickening, or pericholecystic fluid. No intrahepatic or extrahepatic biliary dilation. Pancreas: No focal mass lesion. No dilatation of the main duct. No intraparenchymal cyst. No peripancreatic edema. Spleen: No splenomegaly. No focal mass lesion. Adrenals/Urinary Tract: No adrenal nodule or mass. Stable 2.4 cm exophytic cyst upper pole right kidney with other scattered smaller cysts noted in the right kidney. No enhancing mass in either kidney. No evidence for hydroureter. The urinary bladder appears normal for the degree of distention. Stomach/Bowel: Stomach is nondistended. No gastric wall thickening. No evidence of outlet obstruction. Duodenum is normally positioned as is the ligament of Treitz. No small bowel wall thickening. No small bowel dilatation. The terminal ileum is normal. The appendix is not visualized, but there is no edema or inflammation in the region of the cecum. No gross colonic mass. No colonic wall thickening. No substantial diverticular change. Vascular/Lymphatic: There is abdominal aortic atherosclerosis without aneurysm. There is no gastrohepatic or hepatoduodenal ligament lymphadenopathy. No intraperitoneal or retroperitoneal lymphadenopathy. No pelvic sidewall lymphadenopathy. Reproductive: Uterus is surgically absent. There is no adnexal mass. Other: No intraperitoneal free fluid. Musculoskeletal: Bone windows reveal no worrisome lytic or sclerotic osseous lesions. IMPRESSION: 1. No  substantial change in the bilateral pulmonary lesions. 2. Slight increase in mediastinal and left hilar lymphadenopathy. 3. No evidence for metastatic disease in the abdomen or pelvis. Electronically Signed   By: Misty Stanley M.D.   On: 10/01/2015 08:44    IMPRESSION: Stage IV (T2a, N0, M1a) non-small cell lung cancerwith an increasing left upper lobe nodule and mediastinal adenopathy .   PLAN:  Dr. Lisbeth Renshaw reviews the patient's history and recent imaging. He discusses with the patient that she  is a good candidate for a 15 fraction course of palliative radiation treatment to the dominate left lung tumor and  regional lymph nodes. Dr. Lisbeth Renshaw discusses the risks, benefits, long and short-term side effects of radiotherapy.  Written consent is obtained, and the patient is scheduled for CT simulation on 10/22/15 at [facility]. At the end of the conversation all of her questions were answered to her satisfaction and she wishes to proceed.  The above documentation reflects my direct findings during this shared patient visit. Please see the separate note by Dr. Lisbeth Renshaw on this date for the remainder of the patient's plan of care.   Carola Rhine, PAC  This document serves as a record of services personally performed by Carola Rhine, PAC and Kyung Rudd, MD. It was created on their behalf by Darcus Austin, a trained medical scribe. The creation of this record is based on the scribe's personal observations and the provider's statements to them. This document has been checked and approved by the attending provider.

## 2015-10-10 NOTE — Progress Notes (Signed)
Please see the Nurse Progress Note in the MD Initial Consult Encounter for this patient. 

## 2015-10-12 NOTE — Assessment & Plan Note (Signed)
  Follow up with Oncology as planned .  Work on not smoking .  Follow up Dr. Elsworth Soho  In 4 months and As needed

## 2015-10-12 NOTE — Assessment & Plan Note (Signed)
Continue on BREO  Follow up with Oncology as planned .  Work on not smoking .  Follow up Dr. Elsworth Soho  In 4 months and As needed

## 2015-10-22 ENCOUNTER — Ambulatory Visit
Admission: RE | Admit: 2015-10-22 | Discharge: 2015-10-22 | Disposition: A | Discharge status: Home / Self Care | Payer: Self-pay | Source: Ambulatory Visit | Attending: Radiation Oncology | Admitting: Radiation Oncology

## 2015-10-22 DIAGNOSIS — C3412 Malignant neoplasm of upper lobe, left bronchus or lung: Secondary | ICD-10-CM

## 2015-10-23 ENCOUNTER — Ambulatory Visit: Payer: Self-pay | Admitting: Internal Medicine

## 2015-10-23 ENCOUNTER — Ambulatory Visit: Payer: Self-pay

## 2015-10-23 ENCOUNTER — Other Ambulatory Visit: Payer: Self-pay

## 2015-10-24 DIAGNOSIS — C3412 Malignant neoplasm of upper lobe, left bronchus or lung: Secondary | ICD-10-CM | POA: Insufficient documentation

## 2015-10-24 NOTE — Progress Notes (Signed)
  Radiation Oncology         [phone] ________________________________  Name: Heather Banks MRN: [id number]  Date: 10/22/2015  DOB: Dec 11, 1950  SIMULATION AND TREATMENT PLANNING NOTE  DIAGNOSIS:     ICD-9-CM ICD-10-CM   1. Primary cancer of left upper lobe of lung (HCC) 162.3 C34.12      Site:  Left upper lobe/ adjacent nodes  NARRATIVE:  The patient was brought to the [facility].  Identity was confirmed.  All relevant records and images related to the planned course of therapy were reviewed.   Written consent to proceed with treatment was confirmed which was freely given after reviewing the details related to the planned course of therapy had been reviewed with the patient.  Then, the patient was set-up in a stable reproducible  supine position for radiation therapy.  CT images were obtained.  Surface markings were placed.     The CT images were loaded into the planning software.  Then the target and avoidance structures were contoured.  Treatment planning then occurred.  The radiation prescription was entered and confirmed.  A total of 3 complex treatment devices were fabricated which relate to the designed radiation treatment fields. Each of these customized fields/ complex treatment devices will be used on a daily basis during the radiation course. I have requested : 3D Simulation  I have requested a DVH of the following structures: Target volume, lungs, spinal cord.   PLAN:  The patient will receive 37.5 Gy in 15 fractions.  ________________________________   Jodelle Gross, MD, PhD

## 2015-10-29 ENCOUNTER — Ambulatory Visit
Admission: RE | Admit: 2015-10-29 | Discharge: 2015-10-29 | Disposition: A | Discharge status: Home / Self Care | Payer: Self-pay | Source: Ambulatory Visit | Attending: Radiation Oncology | Admitting: Radiation Oncology

## 2015-10-29 DIAGNOSIS — C3412 Malignant neoplasm of upper lobe, left bronchus or lung: Secondary | ICD-10-CM

## 2015-10-29 NOTE — Progress Notes (Signed)
Department of Radiation Oncology  Phone:  [phone] Fax:        [phone]  Weekly Treatment Note    Name: Heather Banks Date: 10/29/2015 MRN: [id number] DOB: [birth year]   Diagnosis:     ICD-9-CM ICD-10-CM   1. Primary cancer of left upper lobe of lung (HCC) 162.3 C34.12      Current dose: 2.5 Gy  Current fraction:1   MEDICATIONS: Current Outpatient Prescriptions  Medication Sig Dispense Refill  . albuterol (PROVENTIL HFA;VENTOLIN HFA) 108 (90 BASE) MCG/ACT inhaler Inhale 2 puffs into the lungs every 6 (six) hours as needed for wheezing or shortness of breath. 1 Inhaler 6  . albuterol (PROVENTIL) (2.5 MG/3ML) 0.083% nebulizer solution Take 3 mLs (2.5 mg total) by nebulization every 6 (six) hours as needed for wheezing or shortness of breath. 75 mL 3  . ALPRAZolam (XANAX) 0.5 MG tablet Take 1 tablet (0.5 mg total) by mouth at bedtime as needed for anxiety. 30 tablet 0  . aspirin-acetaminophen-caffeine (EXCEDRIN MIGRAINE) 916-384-66 MG per tablet Take 2 tablets by mouth every 6 (six) hours as needed for headache.    . diphenhydrAMINE (BENADRYL) 25 mg capsule Take 25 mg by mouth every 6 (six) hours as needed. Reported on 10/10/2015    . Fluticasone Furoate-Vilanterol 100-25 MCG/INH AEPB Inhale 1 puff into the lungs daily. 1 each 3  . Hydrocortisone Acetate (LANACORT 10) 1 % CREA Apply topically.    . hydrocortisone cream 0.5 % Apply 1 application topically 2 (two) times daily.    Marland Kitchen lidocaine-prilocaine (EMLA) cream Apply 1 application topically as needed. Apply 1-2 hours prior to IV treatment 30 g 1  . mirtazapine (REMERON) 30 MG tablet Take 1 tablet (30 mg total) by mouth at bedtime. 30 tablet 0  . nicotine (NICODERM CQ) 21 mg/24hr patch Place 1 patch (21 mg total) onto the skin daily. (Patient not taking: Reported on 10/10/2015) 28 patch 0  . omeprazole (PRILOSEC) 20 MG capsule TAKE 1 CAPSULE BY MOUTH TWICE DAILY BEFORE A MEAL 60 capsule 0  . predniSONE (DELTASONE) 20 MG  tablet 4 tab po qd x 2 weeks, 3 tab daily x 1 week, 2 tab daily X 1 week, one tab daily X one wee, 1/2 tab daily X one week. 1/2 tab every other day X one week, then stop 100 tablet 0  . Probiotic Product (PROBIOTIC DAILY PO) Take by mouth.    . prochlorperazine (COMPAZINE) 10 MG tablet Take 1 tablet (10 mg total) by mouth every 6 (six) hours as needed for nausea or vomiting. 30 tablet 0  . ranitidine (ZANTAC) 150 MG tablet Take 150 mg by mouth daily as needed for heartburn.     No current facility-administered medications for this encounter.   Facility-Administered Medications Ordered in Other Encounters  Medication Dose Route Frequency Provider Last Rate Last Dose  . heparin lock flush 100 unit/mL  500 Units Intracatheter Once PRN Curt Bears, MD      . sodium chloride 0.9 % injection 10 mL  10 mL Intracatheter PRN Curt Bears, MD   10 mL at 07/31/15 1635  . sodium chloride 0.9 % injection 10 mL  10 mL Intracatheter PRN Curt Bears, MD         ALLERGIES: Review of patient's allergies indicates no known allergies.   LABORATORY DATA:  Lab Results  Component Value Date   WBC 7.7 10/02/2015   HGB 13.2 10/02/2015   HCT 40.3 10/02/2015   MCV 91.1 10/02/2015   PLT 341  10/02/2015   Lab Results  Component Value Date   NA 142 10/02/2015   K 4.4 10/02/2015   CL 103 08/11/2014   CO2 24 10/02/2015   Lab Results  Component Value Date   ALT 10 10/02/2015   AST 15 10/02/2015   ALKPHOS 137 10/02/2015   BILITOT <0.2 10/02/2015     NARRATIVE: Heather Banks was seen today for weekly treatment management. The chart was checked and the patient's films were reviewed.  The patient is doing well undergoing her first fraction of radiation treatment today. No new difficulties since she was last seen. She was somewhat concerned about beginning her treatment with her never having experience radiation before we discussed this.  PHYSICAL EXAMINATION:   Alert, no acute distress       ASSESSMENT: The patient is doing satisfactorily with treatment.  PLAN: We will continue with the patient's radiation treatment as planned.

## 2015-10-30 ENCOUNTER — Ambulatory Visit
Admission: RE | Admit: 2015-10-30 | Discharge: 2015-10-30 | Disposition: A | Discharge status: Home / Self Care | Payer: Self-pay | Source: Ambulatory Visit | Attending: Radiation Oncology | Admitting: Radiation Oncology

## 2015-10-31 ENCOUNTER — Ambulatory Visit
Admission: RE | Admit: 2015-10-31 | Discharge: 2015-10-31 | Disposition: A | Discharge status: Home / Self Care | Payer: Self-pay | Source: Ambulatory Visit | Attending: Radiation Oncology | Admitting: Radiation Oncology

## 2015-10-31 DIAGNOSIS — C3412 Malignant neoplasm of upper lobe, left bronchus or lung: Secondary | ICD-10-CM

## 2015-10-31 MED ORDER — RADIAPLEXRX EX GEL
Freq: Once | CUTANEOUS | Status: AC
  Administered 2015-10-31: 09:00:00 via TOPICAL

## 2015-10-31 NOTE — Progress Notes (Signed)
Radiation therapy and you book, radiaplex gel, my business card given to patient, discussed ways tio mange side effects,pain, skin irritation to throat,chest;,  difficulty swallowing, to eat softer foods, 5-6 smaller meals with snacks between meals, increase protein in diet,stay hydrated,drink plenty fluid, water, fatigue, verbal understanding, stop smoking, has cut down stated, teach back given 8:46 AM

## 2015-11-01 ENCOUNTER — Ambulatory Visit
Admission: RE | Admit: 2015-11-01 | Discharge: 2015-11-01 | Disposition: A | Discharge status: Home / Self Care | Payer: Self-pay | Source: Ambulatory Visit | Attending: Radiation Oncology | Admitting: Radiation Oncology

## 2015-11-02 ENCOUNTER — Encounter: Payer: Self-pay | Admitting: Radiation Oncology

## 2015-11-02 ENCOUNTER — Ambulatory Visit
Admission: RE | Admit: 2015-11-02 | Discharge: 2015-11-02 | Disposition: A | Discharge status: Home / Self Care | Payer: Self-pay | Source: Ambulatory Visit | Attending: Radiation Oncology | Admitting: Radiation Oncology

## 2015-11-05 ENCOUNTER — Ambulatory Visit
Admission: RE | Admit: 2015-11-05 | Discharge: 2015-11-05 | Disposition: A | Discharge status: Home / Self Care | Payer: Self-pay | Source: Ambulatory Visit | Attending: Radiation Oncology | Admitting: Radiation Oncology

## 2015-11-06 ENCOUNTER — Ambulatory Visit
Admission: RE | Admit: 2015-11-06 | Discharge: 2015-11-06 | Disposition: A | Discharge status: Home / Self Care | Payer: Self-pay | Source: Ambulatory Visit | Attending: Radiation Oncology | Admitting: Radiation Oncology

## 2015-11-07 ENCOUNTER — Ambulatory Visit
Admission: RE | Admit: 2015-11-07 | Discharge: 2015-11-07 | Disposition: A | Discharge status: Home / Self Care | Payer: Self-pay | Source: Ambulatory Visit | Attending: Radiation Oncology | Admitting: Radiation Oncology

## 2015-11-08 ENCOUNTER — Ambulatory Visit
Admission: RE | Admit: 2015-11-08 | Discharge: 2015-11-08 | Disposition: A | Discharge status: Home / Self Care | Payer: Self-pay | Source: Ambulatory Visit | Attending: Radiation Oncology | Admitting: Radiation Oncology

## 2015-11-09 ENCOUNTER — Ambulatory Visit
Admission: RE | Admit: 2015-11-09 | Discharge: 2015-11-09 | Disposition: A | Discharge status: Home / Self Care | Payer: Self-pay | Source: Ambulatory Visit | Attending: Radiation Oncology | Admitting: Radiation Oncology

## 2015-11-09 ENCOUNTER — Encounter: Payer: Self-pay | Admitting: Radiation Oncology

## 2015-11-09 VITALS — BP 140/89 | HR 98 | Temp 98.2°F | Resp 20 | Wt 204.1 lb

## 2015-11-09 DIAGNOSIS — C3412 Malignant neoplasm of upper lobe, left bronchus or lung: Secondary | ICD-10-CM

## 2015-11-09 MED ORDER — SUCRALFATE 1 G PO TABS: 1.0000 g | ORAL_TABLET | Freq: Four times a day (QID) | ORAL | Status: DC

## 2015-11-09 NOTE — Progress Notes (Signed)
Weekly rad txs chest 10/15 txs completed, no skin irritation,using radaiplex bid, cough congestive productive clear sputum, takes robitussin prn, fatigued, difficulty swallowing eating softer foods Needs carafate 9:10 AM BP 140/89 mmHg  Pulse 98  Temp(Src) 98.2 F (36.8 C) (Oral)  Resp 20  Wt 204 lb 1.6 oz (92.579 kg)  SpO2 100%  Wt Readings from Last 3 Encounters:  11/09/15 204 lb 1.6 oz (92.579 kg)  10/10/15 197 lb 12.8 oz (89.721 kg)  10/08/15 196 lb (88.905 kg)

## 2015-11-12 ENCOUNTER — Ambulatory Visit
Admission: RE | Admit: 2015-11-12 | Discharge: 2015-11-12 | Disposition: A | Discharge status: Home / Self Care | Payer: Self-pay | Source: Ambulatory Visit | Attending: Radiation Oncology | Admitting: Radiation Oncology

## 2015-11-12 NOTE — Progress Notes (Signed)
Department of Radiation Oncology  Phone:  [phone] Fax:        [phone]  Weekly Treatment Note    Name: Heather Banks Date: 11/12/2015 MRN: [id number] DOB: 25-Oct-1950   Diagnosis:     ICD-9-CM ICD-10-CM   1. Primary cancer of left upper lobe of lung (HCC) 162.3 C34.12      Current dose: 25 Gy  Current fraction: 10   MEDICATIONS: Current Outpatient Prescriptions  Medication Sig Dispense Refill  . albuterol (PROVENTIL HFA;VENTOLIN HFA) 108 (90 BASE) MCG/ACT inhaler Inhale 2 puffs into the lungs every 6 (six) hours as needed for wheezing or shortness of breath. 1 Inhaler 6  . albuterol (PROVENTIL) (2.5 MG/3ML) 0.083% nebulizer solution Take 3 mLs (2.5 mg total) by nebulization every 6 (six) hours as needed for wheezing or shortness of breath. 75 mL 3  . ALPRAZolam (XANAX) 0.5 MG tablet Take 1 tablet (0.5 mg total) by mouth at bedtime as needed for anxiety. 30 tablet 0  . aspirin-acetaminophen-caffeine (EXCEDRIN MIGRAINE) [phone] MG per tablet Take 2 tablets by mouth every 6 (six) hours as needed for headache.    . diphenhydrAMINE (BENADRYL) 25 mg capsule Take 25 mg by mouth every 6 (six) hours as needed. Reported on 10/31/2015    . Fluticasone Furoate-Vilanterol 100-25 MCG/INH AEPB Inhale 1 puff into the lungs daily. 1 each 3  . hyaluronate sodium (RADIAPLEXRX) GEL Apply 1 application topically 2 (two) times daily.    . Hydrocortisone Acetate (LANACORT 10) 1 % CREA Apply topically. Reported on 10/31/2015    . hydrocortisone cream 0.5 % Apply 1 application topically 2 (two) times daily.    Marland Kitchen omeprazole (PRILOSEC) 20 MG capsule TAKE 1 CAPSULE BY MOUTH TWICE DAILY BEFORE A MEAL 60 capsule 0  . predniSONE (DELTASONE) 20 MG tablet 4 tab po qd x 2 weeks, 3 tab daily x 1 week, 2 tab daily X 1 week, one tab daily X one wee, 1/2 tab daily X one week. 1/2 tab every other day X one week, then stop 100 tablet 0  . Probiotic Product (PROBIOTIC DAILY PO) Take by mouth.    . ranitidine  (ZANTAC) 150 MG tablet Take 150 mg by mouth daily as needed for heartburn.    . lidocaine-prilocaine (EMLA) cream Apply 1 application topically as needed. Apply 1-2 hours prior to IV treatment (Patient not taking: Reported on 10/31/2015) 30 g 1  . mirtazapine (REMERON) 30 MG tablet Take 1 tablet (30 mg total) by mouth at bedtime. (Patient not taking: Reported on 10/31/2015) 30 tablet 0  . nicotine (NICODERM CQ) 21 mg/24hr patch Place 1 patch (21 mg total) onto the skin daily. (Patient not taking: Reported on 10/10/2015) 28 patch 0  . prochlorperazine (COMPAZINE) 10 MG tablet Take 1 tablet (10 mg total) by mouth every 6 (six) hours as needed for nausea or vomiting. (Patient not taking: Reported on 10/31/2015) 30 tablet 0  . sucralfate (CARAFATE) 1 g tablet Take 1 tablet (1 g total) by mouth 4 (four) times daily. 120 tablet 2   No current facility-administered medications for this encounter.   Facility-Administered Medications Ordered in Other Encounters  Medication Dose Route Frequency Provider Last Rate Last Dose  . heparin lock flush 100 unit/mL  500 Units Intracatheter Once PRN Curt Bears, MD      . sodium chloride 0.9 % injection 10 mL  10 mL Intracatheter PRN Curt Bears, MD   10 mL at 07/31/15 1635  . sodium chloride 0.9 % injection 10 mL  10 mL Intracatheter PRN Curt Bears, MD         ALLERGIES: Review of patient's allergies indicates no known allergies.   LABORATORY DATA:  Lab Results  Component Value Date   WBC 7.7 10/02/2015   HGB 13.2 10/02/2015   HCT 40.3 10/02/2015   MCV 91.1 10/02/2015   PLT 341 10/02/2015   Lab Results  Component Value Date   NA 142 10/02/2015   K 4.4 10/02/2015   CL 103 08/11/2014   CO2 24 10/02/2015   Lab Results  Component Value Date   ALT 10 10/02/2015   AST 15 10/02/2015   ALKPHOS 137 10/02/2015   BILITOT <0.2 10/02/2015     NARRATIVE: Heather Banks was seen today for weekly treatment management. The chart was checked and the  patient's films were reviewed.  Weekly rad txs chest 10/15 txs completed, no skin irritation,using radaiplex bid, cough congestive productive clear sputum, takes robitussin prn, fatigued, difficulty swallowing eating softer foods Needs carafate 6:16 AM BP 140/89 mmHg  Pulse 98  Temp(Src) 98.2 F (36.8 C) (Oral)  Resp 20  Wt 204 lb 1.6 oz (92.579 kg)  SpO2 100%  Wt Readings from Last 3 Encounters:  11/09/15 204 lb 1.6 oz (92.579 kg)  10/10/15 197 lb 12.8 oz (89.721 kg)  10/08/15 196 lb (88.905 kg)    PHYSICAL EXAMINATION: weight is 204 lb 1.6 oz (92.579 kg). Her oral temperature is 98.2 F (36.8 C). Her blood pressure is 140/89 and her pulse is 98. Her respiration is 20 and oxygen saturation is 100%.        ASSESSMENT: The patient is doing satisfactorily with treatment.  PLAN: We will continue with the patient's radiation treatment as planned. The patient has been given a prescription for Carafate for increasing esophagitis.

## 2015-11-13 ENCOUNTER — Ambulatory Visit
Admission: RE | Admit: 2015-11-13 | Discharge: 2015-11-13 | Disposition: A | Discharge status: Home / Self Care | Payer: Self-pay | Source: Ambulatory Visit | Attending: Radiation Oncology | Admitting: Radiation Oncology

## 2015-11-14 ENCOUNTER — Ambulatory Visit
Admission: RE | Admit: 2015-11-14 | Discharge: 2015-11-14 | Disposition: A | Discharge status: Home / Self Care | Payer: Self-pay | Source: Ambulatory Visit | Attending: Radiation Oncology | Admitting: Radiation Oncology

## 2015-11-15 ENCOUNTER — Ambulatory Visit
Admission: RE | Admit: 2015-11-15 | Discharge: 2015-11-15 | Disposition: A | Discharge status: Home / Self Care | Payer: Self-pay | Source: Ambulatory Visit | Attending: Radiation Oncology | Admitting: Radiation Oncology

## 2015-11-15 ENCOUNTER — Telehealth: Payer: Self-pay | Admitting: Medical Oncology

## 2015-11-15 NOTE — Telephone Encounter (Signed)
[id number] LVMOM with patient inquiring if we could meet tomorrow after her appointment with Dr. Lisbeth Renshaw. Patient is in follow-up with study and this is her 3 month follow-up for me to see how she is and if she can complete PRO's. Asked patient to call me back and left message that if I don't hear from her prior to end of day today, then I would meet with her after Dr. Ida Rogue appt, if she has time.  Adele Dan, RN, BSN Clinical Research 11/15/2015 3:55 PM

## 2015-11-16 ENCOUNTER — Encounter: Payer: Self-pay | Admitting: Medical Oncology

## 2015-11-16 ENCOUNTER — Encounter: Payer: Self-pay | Admitting: Radiation Oncology

## 2015-11-16 ENCOUNTER — Ambulatory Visit
Admission: RE | Admit: 2015-11-16 | Discharge: 2015-11-16 | Disposition: A | Discharge status: Home / Self Care | Payer: Self-pay | Source: Ambulatory Visit | Attending: Radiation Oncology | Admitting: Radiation Oncology

## 2015-11-16 VITALS — BP 152/83 | HR 94 | Temp 98.4°F | Resp 24 | Wt 203.1 lb

## 2015-11-16 DIAGNOSIS — C3492 Malignant neoplasm of unspecified part of left bronchus or lung: Secondary | ICD-10-CM

## 2015-11-16 DIAGNOSIS — C3412 Malignant neoplasm of upper lobe, left bronchus or lung: Secondary | ICD-10-CM

## 2015-11-16 NOTE — Progress Notes (Signed)
PAIN: She is currently in no pain.  RESPIRATORY: Wheezing, Shortness of Breath  Walking and Coughing  Dry, Productive and Color of Phlegm  white and clear. Pt is on room air. Skin warm dry and intact on chest and back. SWALLOWING/DIET: Pt denies dysphagia, but reports she tries to eat soft foods, to avoid choking. OTHER: Pt complains of fatigue. BP 152/83 mmHg  Pulse 94  Temp(Src) 98.4 F (36.9 C) (Oral)  Resp 24  Wt 203 lb 1.6 oz (92.126 kg)  SpO2 98% Wt Readings from Last 3 Encounters:  11/16/15 203 lb 1.6 oz (92.126 kg)  11/09/15 204 lb 1.6 oz (92.579 kg)  10/10/15 197 lb 12.8 oz (89.721 kg)   Today is EOT, one month follow up card given.

## 2015-11-16 NOTE — Progress Notes (Signed)
[id number]: 3 month follow-up. Patient is in her 3 month follow-up after progression with study. I met with patient this morning prior to her appt with Dr. Lisbeth Renshaw in radiation. Patient has completed her final radiation treatment today. Patient reports to being really fatigued, especially on days after radiation treatment. Patient reports no pain, SOB with activity/exertion, occasional wheezing and continues to have lesions related to Tecentriq treatment on arms and legs. Patient completed month 3 EQ-5D-3L PRO's with me today. All patient's questions answered to her satisfaction. Patient was thanked for her time and continued support of study and encouraged to contact Dr. Julien Nordmann or myself with any questions or concerns. Patient has follow up with Dr. Julien Nordmann in June, with CT scan to occur prior to this appt. Adele Dan, RN, BSN Clinical Research 11/16/2015 10:03 AM

## 2015-11-16 NOTE — Progress Notes (Signed)
Department of Radiation Oncology  Phone:  [phone] Fax:        [phone]  Weekly Treatment Note    Name: Heather Banks Date: 11/16/2015 MRN: [id number] DOB: 11/27/1950   Diagnosis:     ICD-9-CM ICD-10-CM   1. Primary cancer of left upper lobe of lung (HCC) 162.3 C34.12      Current dose: 37.5 Gy  Current fraction: 15   MEDICATIONS: Current Outpatient Prescriptions  Medication Sig Dispense Refill  . albuterol (PROVENTIL HFA;VENTOLIN HFA) 108 (90 BASE) MCG/ACT inhaler Inhale 2 puffs into the lungs every 6 (six) hours as needed for wheezing or shortness of breath. 1 Inhaler 6  . albuterol (PROVENTIL) (2.5 MG/3ML) 0.083% nebulizer solution Take 3 mLs (2.5 mg total) by nebulization every 6 (six) hours as needed for wheezing or shortness of breath. 75 mL 3  . ALPRAZolam (XANAX) 0.5 MG tablet Take 1 tablet (0.5 mg total) by mouth at bedtime as needed for anxiety. 30 tablet 0  . aspirin-acetaminophen-caffeine (EXCEDRIN MIGRAINE) [phone] MG per tablet Take 2 tablets by mouth every 6 (six) hours as needed for headache.    . diphenhydrAMINE (BENADRYL) 25 mg capsule Take 25 mg by mouth every 6 (six) hours as needed. Reported on 10/31/2015    . Fluticasone Furoate-Vilanterol 100-25 MCG/INH AEPB Inhale 1 puff into the lungs daily. 1 each 3  . hyaluronate sodium (RADIAPLEXRX) GEL Apply 1 application topically 2 (two) times daily.    . Hydrocortisone Acetate (LANACORT 10) 1 % CREA Apply topically. Reported on 10/31/2015    . hydrocortisone cream 0.5 % Apply 1 application topically 2 (two) times daily.    Marland Kitchen lidocaine-prilocaine (EMLA) cream Apply 1 application topically as needed. Apply 1-2 hours prior to IV treatment (Patient not taking: Reported on 10/31/2015) 30 g 1  . mirtazapine (REMERON) 30 MG tablet Take 1 tablet (30 mg total) by mouth at bedtime. (Patient not taking: Reported on 10/31/2015) 30 tablet 0  . nicotine (NICODERM CQ) 21 mg/24hr patch Place 1 patch (21 mg total) onto the skin  daily. (Patient not taking: Reported on 10/10/2015) 28 patch 0  . omeprazole (PRILOSEC) 20 MG capsule TAKE 1 CAPSULE BY MOUTH TWICE DAILY BEFORE A MEAL 60 capsule 0  . predniSONE (DELTASONE) 20 MG tablet 4 tab po qd x 2 weeks, 3 tab daily x 1 week, 2 tab daily X 1 week, one tab daily X one wee, 1/2 tab daily X one week. 1/2 tab every other day X one week, then stop 100 tablet 0  . Probiotic Product (PROBIOTIC DAILY PO) Take by mouth.    . prochlorperazine (COMPAZINE) 10 MG tablet Take 1 tablet (10 mg total) by mouth every 6 (six) hours as needed for nausea or vomiting. (Patient not taking: Reported on 10/31/2015) 30 tablet 0  . ranitidine (ZANTAC) 150 MG tablet Take 150 mg by mouth daily as needed for heartburn.    . sucralfate (CARAFATE) 1 g tablet Take 1 tablet (1 g total) by mouth 4 (four) times daily. 120 tablet 2   No current facility-administered medications for this encounter.   Facility-Administered Medications Ordered in Other Encounters  Medication Dose Route Frequency Provider Last Rate Last Dose  . heparin lock flush 100 unit/mL  500 Units Intracatheter Once PRN Curt Bears, MD      . sodium chloride 0.9 % injection 10 mL  10 mL Intracatheter PRN Curt Bears, MD   10 mL at 07/31/15 1635  . sodium chloride 0.9 % injection 10 mL  10 mL Intracatheter PRN Curt Bears, MD         ALLERGIES: Review of patient's allergies indicates no known allergies.   LABORATORY DATA:  Lab Results  Component Value Date   WBC 7.7 10/02/2015   HGB 13.2 10/02/2015   HCT 40.3 10/02/2015   MCV 91.1 10/02/2015   PLT 341 10/02/2015   Lab Results  Component Value Date   NA 142 10/02/2015   K 4.4 10/02/2015   CL 103 08/11/2014   CO2 24 10/02/2015   Lab Results  Component Value Date   ALT 10 10/02/2015   AST 15 10/02/2015   ALKPHOS 137 10/02/2015   BILITOT <0.2 10/02/2015     NARRATIVE: Heather Banks was seen today for weekly treatment management. The chart was checked and the  patient's films were reviewed.  PAIN: She is currently in no pain.  RESPIRATORY: Wheezing, Shortness of Breath  Walking and Coughing  Dry, Productive and Color of Phlegm  white and clear. Pt is on room air. Skin warm dry and intact on chest and back. SWALLOWING/DIET: Pt denies dysphagia, but reports she tries to eat soft foods, to avoid choking. OTHER: Pt complains of fatigue. BP 152/83 mmHg  Pulse 94  Temp(Src) 98.4 F (36.9 C) (Oral)  Resp 24  Wt 203 lb 1.6 oz (92.126 kg)  SpO2 98% Wt Readings from Last 3 Encounters:  11/16/15 203 lb 1.6 oz (92.126 kg)  11/09/15 204 lb 1.6 oz (92.579 kg)  10/10/15 197 lb 12.8 oz (89.721 kg)   Today is EOT, one month follow up card given.       PHYSICAL EXAMINATION: weight is 203 lb 1.6 oz (92.126 kg). Her oral temperature is 98.4 F (36.9 C). Her blood pressure is 152/83 and her pulse is 94. Her respiration is 24 and oxygen saturation is 98%.        ASSESSMENT: The patient is doing satisfactorily with treatment.  PLAN: We will continue with the patient's radiation treatment as planned. The patient finished her final fraction today. She has done well over the last month. She will follow-up in our clinic in 1 month.

## 2015-11-19 ENCOUNTER — Encounter: Payer: Self-pay | Admitting: Radiation Oncology

## 2015-11-19 NOTE — Progress Notes (Signed)
°  Radiation Oncology         [phone]) [phone] ________________________________  Name: Heather Banks MRN: [id number]  Date: 11/19/2015  DOB: 1950-08-30  End of Treatment Note  Diagnosis:   Primary cancer of left upper lobe of lung ([place]).  Stage IV (T2a, N0, M1a) non-small cell lung cancer, adenocarcinoma originally presented with bilateral nodules.     Indication for treatment:   Palliative     Radiation treatment dates:   10/29/2015 to 11/16/2015  Site/dose:  The Left upper lobe was treated to 37.5 Gy in 15 fractions at 2.5 Gy per fraction.   Beams/energy:   3D-Conformal / 10X, 6X  Narrative: The patient tolerated radiation treatment relatively well.  The patient experienced some fatigue and respiratory symptoms including wheezing, shortness of breath while walking, and dry productive cough with phlegm white and clear in color.  Plan: The patient has completed radiation treatment. The patient will return to radiation oncology clinic for routine followup in one month. I advised them to call or return sooner if they have any questions or concerns related to their recovery or treatment.  ------------------------------------------------  Jodelle Gross, MD, PhD  This document serves as a record of services personally performed by Kyung Rudd, MD. It was created on his behalf by Arlyce Harman, a trained medical scribe. The creation of this record is based on the scribe's personal observations and the provider's statements to them. This document has been checked and approved by the attending provider.

## 2015-11-23 ENCOUNTER — Telehealth: Payer: Self-pay | Admitting: Medical Oncology

## 2015-11-23 DIAGNOSIS — R21 Rash and other nonspecific skin eruption: Secondary | ICD-10-CM

## 2015-11-23 NOTE — Telephone Encounter (Signed)
Pt notified that she was referred to Allyn Kenner, Dermatologist

## 2015-11-23 NOTE — Telephone Encounter (Signed)
Her rash settled down while she was on prednisone and getting radiation. She completed prednisone approx April 14th. Now the rash  has recurred and "exploded "  on her legs. She stated the rash on her hands is getting progressively worse everyday. She is taking Benadryl 50 mg every 12 hours and using coconut oil. " I am miserable". Note to [place].

## 2015-12-25 ENCOUNTER — Ambulatory Visit: Payer: Self-pay | Admitting: Radiation Oncology

## 2015-12-26 ENCOUNTER — Ambulatory Visit: Payer: Self-pay | Admitting: Radiation Oncology

## 2015-12-31 ENCOUNTER — Ambulatory Visit (HOSPITAL_COMMUNITY): Payer: Self-pay

## 2016-01-01 ENCOUNTER — Encounter (HOSPITAL_COMMUNITY): Payer: Self-pay

## 2016-01-01 ENCOUNTER — Other Ambulatory Visit (HOSPITAL_BASED_OUTPATIENT_CLINIC_OR_DEPARTMENT_OTHER): Payer: Self-pay

## 2016-01-01 ENCOUNTER — Ambulatory Visit (HOSPITAL_COMMUNITY)
Admission: RE | Admit: 2016-01-01 | Discharge: 2016-01-01 | Disposition: A | Discharge status: Home / Self Care | Payer: Medicaid - Out of State | Source: Ambulatory Visit | Attending: Internal Medicine | Admitting: Internal Medicine

## 2016-01-01 DIAGNOSIS — C3490 Malignant neoplasm of unspecified part of unspecified bronchus or lung: Secondary | ICD-10-CM

## 2016-01-01 DIAGNOSIS — C3492 Malignant neoplasm of unspecified part of left bronchus or lung: Secondary | ICD-10-CM

## 2016-01-01 DIAGNOSIS — Z5112 Encounter for antineoplastic immunotherapy: Secondary | ICD-10-CM | POA: Insufficient documentation

## 2016-01-01 DIAGNOSIS — I2510 Atherosclerotic heart disease of native coronary artery without angina pectoris: Secondary | ICD-10-CM | POA: Insufficient documentation

## 2016-01-01 DIAGNOSIS — I77811 Abdominal aortic ectasia: Secondary | ICD-10-CM | POA: Diagnosis not present

## 2016-01-01 DIAGNOSIS — I2699 Other pulmonary embolism without acute cor pulmonale: Secondary | ICD-10-CM | POA: Diagnosis not present

## 2016-01-01 DIAGNOSIS — L270 Generalized skin eruption due to drugs and medicaments taken internally: Secondary | ICD-10-CM

## 2016-01-01 DIAGNOSIS — R918 Other nonspecific abnormal finding of lung field: Secondary | ICD-10-CM | POA: Insufficient documentation

## 2016-01-01 DIAGNOSIS — J439 Emphysema, unspecified: Secondary | ICD-10-CM | POA: Diagnosis not present

## 2016-01-01 DIAGNOSIS — I700 Atherosclerosis of aorta: Secondary | ICD-10-CM | POA: Diagnosis not present

## 2016-01-01 DIAGNOSIS — R5382 Chronic fatigue, unspecified: Secondary | ICD-10-CM

## 2016-01-01 LAB — COMPREHENSIVE METABOLIC PANEL
ALT: 14 U/L (ref 0–55)
AST: 15 U/L (ref 5–34)
Albumin: 3.7 g/dL (ref 3.5–5.0)
Alkaline Phosphatase: 113 U/L (ref 40–150)
Anion Gap: 11 mEq/L (ref 3–11)
BUN: 7.6 mg/dL (ref 7.0–26.0)
CHLORIDE: 105 meq/L (ref 98–109)
CO2: 25 mEq/L (ref 22–29)
Calcium: 9.3 mg/dL (ref 8.4–10.4)
Creatinine: 0.8 mg/dL (ref 0.6–1.1)
EGFR: 82 mL/min/{1.73_m2} — ABNORMAL LOW (ref >90)
GLUCOSE: 91 mg/dL (ref 70–140)
POTASSIUM: 3.7 meq/L (ref 3.5–5.1)
SODIUM: 140 meq/L (ref 136–145)
Total Bilirubin: <0.3 mg/dL (ref 0.20–1.20)
Total Protein: 7.3 g/dL (ref 6.4–8.3)

## 2016-01-01 LAB — CBC WITH DIFFERENTIAL/PLATELET
BASO%: 0.8 % (ref 0.0–2.0)
Basophils Absolute: 0.1 10*3/uL (ref 0.0–0.1)
EOS ABS: 0.3 10*3/uL (ref 0.0–0.5)
EOS%: 5.3 % (ref 0.0–7.0)
HCT: 42.2 % (ref 34.8–46.6)
HGB: 13.9 g/dL (ref 11.6–15.9)
LYMPH%: 15.4 % (ref 14.0–49.7)
MCH: 30.6 pg (ref 25.1–34.0)
MCHC: 32.9 g/dL (ref 31.5–36.0)
MCV: 93 fL (ref 79.5–101.0)
MONO#: 0.5 10*3/uL (ref 0.1–0.9)
MONO%: 7.5 % (ref 0.0–14.0)
NEUT#: 4.4 10*3/uL (ref 1.5–6.5)
NEUT%: 71 % (ref 38.4–76.8)
Platelets: 297 10*3/uL (ref 145–400)
RBC: 4.54 10*6/uL (ref 3.70–5.45)
RDW: 16.1 % — AB (ref 11.2–14.5)
WBC: 6.2 10*3/uL (ref 3.9–10.3)
lymph#: 1 10*3/uL (ref 0.9–3.3)

## 2016-01-01 LAB — TSH: TSH: 0.776 m[IU]/L (ref 0.308–3.960)

## 2016-01-01 MED ORDER — IOPAMIDOL (ISOVUE-300) INJECTION 61%
100.0000 mL | Freq: Once | INTRAVENOUS | Status: AC | PRN
  Administered 2016-01-01: 100 mL via INTRAVENOUS

## 2016-01-07 ENCOUNTER — Encounter: Payer: Self-pay | Admitting: Radiation Oncology

## 2016-01-08 ENCOUNTER — Other Ambulatory Visit: Payer: Self-pay | Admitting: Internal Medicine

## 2016-01-08 ENCOUNTER — Ambulatory Visit
Admission: RE | Admit: 2016-01-08 | Discharge: 2016-01-08 | Disposition: A | Discharge status: Home / Self Care | Payer: Self-pay | Source: Ambulatory Visit | Attending: Radiation Oncology | Admitting: Radiation Oncology

## 2016-01-08 ENCOUNTER — Encounter: Payer: Self-pay | Admitting: Internal Medicine

## 2016-01-08 ENCOUNTER — Encounter: Payer: Self-pay | Admitting: Medical Oncology

## 2016-01-08 ENCOUNTER — Ambulatory Visit (HOSPITAL_BASED_OUTPATIENT_CLINIC_OR_DEPARTMENT_OTHER): Payer: Self-pay | Admitting: Internal Medicine

## 2016-01-08 ENCOUNTER — Encounter: Payer: Self-pay | Admitting: Radiation Oncology

## 2016-01-08 VITALS — BP 125/82 | HR 85 | Temp 98.2°F | Resp 18 | Ht 65.5 in | Wt 194.6 lb

## 2016-01-08 VITALS — BP 122/84 | HR 93 | Temp 97.7°F | Ht 65.5 in | Wt 195.5 lb

## 2016-01-08 DIAGNOSIS — R05 Cough: Secondary | ICD-10-CM | POA: Insufficient documentation

## 2016-01-08 DIAGNOSIS — Z7982 Long term (current) use of aspirin: Secondary | ICD-10-CM | POA: Insufficient documentation

## 2016-01-08 DIAGNOSIS — C3412 Malignant neoplasm of upper lobe, left bronchus or lung: Secondary | ICD-10-CM

## 2016-01-08 DIAGNOSIS — C3492 Malignant neoplasm of unspecified part of left bronchus or lung: Secondary | ICD-10-CM

## 2016-01-08 DIAGNOSIS — R0602 Shortness of breath: Secondary | ICD-10-CM | POA: Insufficient documentation

## 2016-01-08 DIAGNOSIS — Z923 Personal history of irradiation: Secondary | ICD-10-CM | POA: Insufficient documentation

## 2016-01-08 DIAGNOSIS — I2699 Other pulmonary embolism without acute cor pulmonale: Secondary | ICD-10-CM | POA: Insufficient documentation

## 2016-01-08 DIAGNOSIS — R110 Nausea: Secondary | ICD-10-CM

## 2016-01-08 HISTORY — DX: Personal history of irradiation: Z92.3

## 2016-01-08 MED ORDER — PROCHLORPERAZINE MALEATE 10 MG PO TABS: 10.0000 mg | ORAL_TABLET | Freq: Four times a day (QID) | ORAL | Status: DC | PRN

## 2016-01-08 MED ORDER — HYDROCORTISONE 1 % EX OINT: 1.0000 "application " | TOPICAL_OINTMENT | Freq: Two times a day (BID) | CUTANEOUS | Status: DC

## 2016-01-08 NOTE — Progress Notes (Signed)
Radiation Oncology         [phone]) [phone] ________________________________  Name: Jaine Estabrooks MRN: [id number]  Date: 01/08/2016  DOB: 06-Oct-1950  Follow-Up Visit Note  CC: [name]., MD  Curt Bears, MD  Diagnosis:  Stage IV (T2a, N0, M1a) non-small cell lung cancer, adenocarcinoma, left upper lobe of lung.   Interval Since Last Radiation:  8 weeks  10/29/2015 to 11/16/2015: The Left upper lobe was treated to 37.5 Gy in 15 fractions at 2.5 Gy per fraction.  Narrative:  The patient returns today for routine follow-up.  During the course of treatment she did experience radiation induced esophagitis, and used Carafate with improvement of her symptoms. Also of note the patient did experience significant skin reaction with her immunotherapy and has been on prednisone this taper down and was discontinued around the second week of her radiotherapy. She underwent a repeat staging CT scan on 01/01/2016 which revealed significant reduction in her primary tumor as well as resolution of her adenopathy in the chest. Incidentally however there was a pulmonary embolism identified and Dr. Julien Nordmann is aware of this. The patient has not been started on any medication. On review of systems, she states that she has been somewhat short of breath with coughing and has noticed more cough in the last few weeks. She denies any fevers, or significant mucus production. She denies any wheezing, chest pain, chills, or unintended weight changes. She has lost about 8 pounds and attributes this to tapering off of the prednisone. She is eating pretty well but does have to cut her food and smaller right. She denies any significant dysphagia at this point in time. No other complaints or verbalized.   ALLERGIES:  has No Known Allergies.  Meds: Current Outpatient Prescriptions  Medication Sig Dispense Refill  . albuterol (PROVENTIL HFA;VENTOLIN HFA) 108 (90 BASE) MCG/ACT inhaler Inhale 2 puffs into the lungs every 6  (six) hours as needed for wheezing or shortness of breath. 1 Inhaler 6  . albuterol (PROVENTIL) (2.5 MG/3ML) 0.083% nebulizer solution Take 3 mLs (2.5 mg total) by nebulization every 6 (six) hours as needed for wheezing or shortness of breath. 75 mL 3  . ALPRAZolam (XANAX) 0.5 MG tablet Take 1 tablet (0.5 mg total) by mouth at bedtime as needed for anxiety. 30 tablet 0  . aspirin-acetaminophen-caffeine (EXCEDRIN MIGRAINE) 678-938-10 MG per tablet Take 2 tablets by mouth every 6 (six) hours as needed for headache.    . diphenhydrAMINE (BENADRYL) 25 mg capsule Take 25 mg by mouth every 6 (six) hours as needed. Reported on 10/31/2015    . Fluticasone Furoate-Vilanterol 100-25 MCG/INH AEPB Inhale 1 puff into the lungs daily. 1 each 3  . hyaluronate sodium (RADIAPLEXRX) GEL Apply 1 application topically 2 (two) times daily.    . Hydrocortisone Acetate (LANACORT 10) 1 % CREA Apply topically. Reported on 10/31/2015    . hydrocortisone cream 0.5 % Apply 1 application topically 2 (two) times daily.    Marland Kitchen lidocaine-prilocaine (EMLA) cream Apply 1 application topically as needed. Apply 1-2 hours prior to IV treatment 30 g 1  . mirtazapine (REMERON) 30 MG tablet Take 1 tablet (30 mg total) by mouth at bedtime. 30 tablet 0  . nicotine (NICODERM CQ) 21 mg/24hr patch Place 1 patch (21 mg total) onto the skin daily. 28 patch 0  . omeprazole (PRILOSEC) 20 MG capsule TAKE 1 CAPSULE BY MOUTH TWICE DAILY BEFORE A MEAL 60 capsule 0  . Probiotic Product (PROBIOTIC DAILY PO) Take by mouth.    Marland Kitchen  prochlorperazine (COMPAZINE) 10 MG tablet Take 1 tablet (10 mg total) by mouth every 6 (six) hours as needed for nausea or vomiting. 30 tablet 0  . ranitidine (ZANTAC) 150 MG tablet Take 150 mg by mouth daily as needed for heartburn.     No current facility-administered medications for this encounter.   Facility-Administered Medications Ordered in Other Encounters  Medication Dose Route Frequency Provider Last Rate Last Dose  .  heparin lock flush 100 unit/mL  500 Units Intracatheter Once PRN Curt Bears, MD      . sodium chloride 0.9 % injection 10 mL  10 mL Intracatheter PRN Curt Bears, MD   10 mL at 07/31/15 1635  . sodium chloride 0.9 % injection 10 mL  10 mL Intracatheter PRN Curt Bears, MD        Physical Findings:  height is 5' 5.5" (1.664 m) and weight is 195 lb 8 oz (88.678 kg). Her temperature is 97.7 F (36.5 C). Her blood pressure is 122/84 and her pulse is 93. Her oxygen saturation is 94%.   In general this is a well appearing Caucasian female in no acute distress. She is alert and oriented x4 and appropriate throughout the examination. HEENT reveals that the patient is normocephalic, atraumatic. EOMs are intact. PERRLA. Skin is intact without any evidence of gross lesions. Cardiovascular exam reveals a regular rate and rhythm, no clicks rubs or murmurs are auscultated. Chest is clear to auscultation bilaterally.   Lab Findings: Lab Results  Component Value Date   WBC 6.2 01/01/2016   HGB 13.9 01/01/2016   HCT 42.2 01/01/2016   MCV 93.0 01/01/2016   PLT 297 01/01/2016     Radiographic Findings: Ct Chest W Contrast  01/01/2016  CLINICAL DATA:  Stage IV left upper lobe non-small cell lung carcinoma. Chemotherapy, immunotherapy finished. Radiation therapy finished in April 2017. EXAM: CT CHEST, ABDOMEN, AND PELVIS WITH CONTRAST TECHNIQUE: Multidetector CT imaging of the chest, abdomen and pelvis was performed following the standard protocol during bolus administration of intravenous contrast. CONTRAST:  130m ISOVUE-300 IOPAMIDOL (ISOVUE-300) INJECTION 61% COMPARISON:  Multiple exams, including 10/01/2015 FINDINGS: CT CHEST FINDINGS Mediastinum/Nodes: Coronary, aortic arch, and branch vessel atherosclerotic vascular disease. An AP window lymph node has essentially resolved, 3 mm in short axis diameter on image 16/2, previously 12 mm. A prevascular lymph node formerly measuring 0.9 cm on image  21/2 of the prior exam currently measures 4 mm in short axis on image 20/2 today. There is a small filling defect in the lateral basilar segmental pulmonary artery on the left, favoring a small pulmonary embolus, shown on image 28/2. This was not present previously. Today' s exam was not performed as a comprehensive pulmonary embolus protocol exam and less might be expected to have a reduced positive predictive value, but the filling defect is fairly convincing. The right ventricular to left ventricular ratio is 1.0, which may be associated with right heart strain. That said, it was also elevated previously Lungs/Pleura: Left upper lobe mass currently measures 3.3 by 3.0 by 4.3 cm (volume = 22.1 cc) on image 28/4, formerly 5.2 by 4.6 by 6.1 cm (volume = 75.9 cc). The reduced surrounding postobstructive pneumonitis. Underlying emphysema is present. Stable 3 by 4 mm left lower lobe nodule, image 63/4. Biapical pleural parenchymal scarring, stable. Multilobular right upper lobe pulmonary nodule 1.8 by 1.7 cm on image 36/4, previously 1.9 by 1.9 cm. Stable appearance of confluent interstitial and faint nodular opacity superiorly in the right upper lobe with bullous  components, currently measuring 3.3 by 2.3 cm on image 48/4, no change. There is plugging of multiple airways in both lower lobes. Musculoskeletal: Thoracic spondylosis. CT ABDOMEN PELVIS FINDINGS Hepatobiliary: Unremarkable Pancreas: Unremarkable Spleen: Unremarkable Adrenals/Urinary Tract: 2.2 cm benign right renal cyst, fluid density. Several other hypodense lesions the right kidney lower pole are stable and likely cysts, but technically too small to characterize. Otherwise unremarkable. 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 Aortoiliac atherosclerotic vascular disease. No pathologic abdominal adenopathy identified. Reproductive: Hysterectomy. Right ovary normal. Left ovary not well seen.  Other: No supplemental non-categorized findings. Musculoskeletal: Unremarkable. IMPRESSION: 1. Small pulmonary embolus in the left lateral basal segment pulmonary artery, new compared to 10/01/15. Positive for acute PE with CT evidence of right heart strain (RV/LV Ratio = 1.0) which may reflect submassive (intermediate risk) PE -although the right ventricular to left ventricular ratio was also a elevated on prior exams in which the pulmonary embolus was not present. The presence of right heart strain has been associated with an increased risk of morbidity and mortality. Consider activating Code PE by paging [phone]. 2. Interval 71% reduction in volume of the left upper lobe mass. Current size 3.3 by 3.0 by 4.3 cm. 3. Prior pathologic mediastinal adenopathy has resolved. 4. The right-sided lung nodules are stable. 5. Other imaging findings of potential clinical significance: Coronary, aortic arch, and branch vessel atherosclerotic vascular disease. Emphysema. Airway plugging in both lower lobes. Infrarenal abdominal aortic ectasia. Aortoiliac atherosclerotic vascular disease. Critical Value/emergent results were called by telephone at the time of interpretation on 01/01/2016 at 1:04 pm to Dr. Curt Bears , who verbally acknowledged these results. Electronically Signed   By: Van Clines M.D.   On: 01/01/2016 13:07   Ct Abdomen Pelvis W Contrast  01/01/2016  CLINICAL DATA:  Stage IV left upper lobe non-small cell lung carcinoma. Chemotherapy, immunotherapy finished. Radiation therapy finished in April 2017. EXAM: CT CHEST, ABDOMEN, AND PELVIS WITH CONTRAST TECHNIQUE: Multidetector CT imaging of the chest, abdomen and pelvis was performed following the standard protocol during bolus administration of intravenous contrast. CONTRAST:  148m ISOVUE-300 IOPAMIDOL (ISOVUE-300) INJECTION 61% COMPARISON:  Multiple exams, including 10/01/2015 FINDINGS: CT CHEST FINDINGS Mediastinum/Nodes: Coronary, aortic arch,  and branch vessel atherosclerotic vascular disease. An AP window lymph node has essentially resolved, 3 mm in short axis diameter on image 16/2, previously 12 mm. A prevascular lymph node formerly measuring 0.9 cm on image 21/2 of the prior exam currently measures 4 mm in short axis on image 20/2 today. There is a small filling defect in the lateral basilar segmental pulmonary artery on the left, favoring a small pulmonary embolus, shown on image 28/2. This was not present previously. Today' s exam was not performed as a comprehensive pulmonary embolus protocol exam and less might be expected to have a reduced positive predictive value, but the filling defect is fairly convincing. The right ventricular to left ventricular ratio is 1.0, which may be associated with right heart strain. That said, it was also elevated previously Lungs/Pleura: Left upper lobe mass currently measures 3.3 by 3.0 by 4.3 cm (volume = 22.1 cc) on image 28/4, formerly 5.2 by 4.6 by 6.1 cm (volume = 75.9 cc). The reduced surrounding postobstructive pneumonitis. Underlying emphysema is present. Stable 3 by 4 mm left lower lobe nodule, image 63/4. Biapical pleural parenchymal scarring, stable. Multilobular right upper lobe pulmonary nodule 1.8 by 1.7 cm on image 36/4, previously 1.9 by 1.9 cm. Stable appearance of confluent interstitial and faint  nodular opacity superiorly in the right upper lobe with bullous components, currently measuring 3.3 by 2.3 cm on image 48/4, no change. There is plugging of multiple airways in both lower lobes. Musculoskeletal: Thoracic spondylosis. CT ABDOMEN PELVIS FINDINGS Hepatobiliary: Unremarkable Pancreas: Unremarkable Spleen: Unremarkable Adrenals/Urinary Tract: 2.2 cm benign right renal cyst, fluid density. Several other hypodense lesions the right kidney lower pole are stable and likely cysts, but technically too small to characterize. Otherwise unremarkable. 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 Aortoiliac atherosclerotic vascular disease. No pathologic abdominal adenopathy identified. Reproductive: Hysterectomy. Right ovary normal. Left ovary not well seen. Other: No supplemental non-categorized findings. Musculoskeletal: Unremarkable. IMPRESSION: 1. Small pulmonary embolus in the left lateral basal segment pulmonary artery, new compared to 10/01/15. Positive for acute PE with CT evidence of right heart strain (RV/LV Ratio = 1.0) which may reflect submassive (intermediate risk) PE -although the right ventricular to left ventricular ratio was also a elevated on prior exams in which the pulmonary embolus was not present. The presence of right heart strain has been associated with an increased risk of morbidity and mortality. Consider activating Code PE by paging [phone]. 2. Interval 71% reduction in volume of the left upper lobe mass. Current size 3.3 by 3.0 by 4.3 cm. 3. Prior pathologic mediastinal adenopathy has resolved. 4. The right-sided lung nodules are stable. 5. Other imaging findings of potential clinical significance: Coronary, aortic arch, and branch vessel atherosclerotic vascular disease. Emphysema. Airway plugging in both lower lobes. Infrarenal abdominal aortic ectasia. Aortoiliac atherosclerotic vascular disease. Critical Value/emergent results were called by telephone at the time of interpretation on 01/01/2016 at 1:04 pm to Dr. Curt Bears , who verbally acknowledged these results. Electronically Signed   By: Van Clines M.D.   On: 01/01/2016 13:07    Impression/Plan: 1. Stage IV (T2a, N0, M1a) non-small cell lung cancer, adenocarcinoma, left upper lobe of lung. The patient has completed her radiotherapy and has done well and tolerating her treatment related symptoms. She will follow-up with Dr. Julien Nordmann today, and review her CT scan as well as plans for moving forward. We will be happy to see future. Her questions or  concerns regarding her previous treatment or if she requires additional treatment. She is also counseled on the risks of radiation pneumonitis encouraged to call us if she experiences any of these symptoms. 2. Pulmonary embolism. Dr. Julien Nordmann is aware of her most recent imaging finding, and she will be discussing this information with him today.     Carola Rhine, PAC

## 2016-01-08 NOTE — Progress Notes (Signed)
Checked in patient for front staff. Patient states she is working with Medicaid of [place]. Patient states she has a spin down so she has to gather bills and submit to them.

## 2016-01-08 NOTE — Patient Instructions (Signed)
Smoking Cessation, Tips for Success If you are ready to quit smoking, congratulations! You have chosen to help yourself be healthier. Cigarettes bring nicotine, tar, carbon monoxide, and other irritants into your body. Your lungs, heart, and blood vessels will be able to work better without these poisons. There are many different ways to quit smoking. Nicotine gum, nicotine patches, a nicotine inhaler, or nicotine nasal spray can help with physical craving. Hypnosis, support groups, and medicines help break the habit of smoking. WHAT THINGS CAN I DO TO MAKE QUITTING EASIER?  Here are some tips to help you quit for good:  Pick a date when you will quit smoking completely. Tell all of your friends and family about your plan to quit on that date.  Do not try to slowly cut down on the number of cigarettes you are smoking. Pick a quit date and quit smoking completely starting on that day.  Throw away all cigarettes.   Clean and remove all ashtrays from your home, work, and car.  On a card, write down your reasons for quitting. Carry the card with you and read it when you get the urge to smoke.  Cleanse your body of nicotine. Drink enough water and fluids to keep your urine clear or pale yellow. Do this after quitting to flush the nicotine from your body.  Learn to predict your moods. Do not let a bad situation be your excuse to have a cigarette. Some situations in your life might tempt you into wanting a cigarette.  Never have "just one" cigarette. It leads to wanting another and another. Remind yourself of your decision to quit.  Change habits associated with smoking. If you smoked while driving or when feeling stressed, try other activities to replace smoking. Stand up when drinking your coffee. Brush your teeth after eating. Sit in a different chair when you read the paper. Avoid alcohol while trying to quit, and try to drink fewer caffeinated beverages. Alcohol and caffeine may urge you to  smoke.  Avoid foods and drinks that can trigger a desire to smoke, such as sugary or spicy foods and alcohol.  Ask people who smoke not to smoke around you.  Have something planned to do right after eating or having a cup of coffee. For example, plan to take a walk or exercise.  Try a relaxation exercise to calm you down and decrease your stress. Remember, you may be tense and nervous for the first 2 weeks after you quit, but this will pass.  Find new activities to keep your hands busy. Play with a pen, coin, or rubber band. Doodle or draw things on paper.  Brush your teeth right after eating. This will help cut down on the craving for the taste of tobacco after meals. You can also try mouthwash.   Use oral substitutes in place of cigarettes. Try using lemon drops, carrots, cinnamon sticks, or chewing gum. Keep them handy so they are available when you have the urge to smoke.  When you have the urge to smoke, try deep breathing.  Designate your home as a nonsmoking area.  If you are a heavy smoker, ask your health care provider about a prescription for nicotine chewing gum. It can ease your withdrawal from nicotine.  Reward yourself. Set aside the cigarette money you save and buy yourself something nice.  Look for support from others. Join a support group or smoking cessation program. Ask someone at home or at work to help you with your plan   to quit smoking.  Always ask yourself, "Do I need this cigarette or is this just a reflex?" Tell yourself, "Today, I choose not to smoke," or "I do not want to smoke." You are reminding yourself of your decision to quit.  Do not replace cigarette smoking with electronic cigarettes (commonly called e-cigarettes). The safety of e-cigarettes is unknown, and some may contain harmful chemicals.  If you relapse, do not give up! Plan ahead and think about what you will do the next time you get the urge to smoke. HOW WILL I FEEL WHEN I QUIT SMOKING? You  may have symptoms of withdrawal because your body is used to nicotine (the addictive substance in cigarettes). You may crave cigarettes, be irritable, feel very hungry, cough often, get headaches, or have difficulty concentrating. The withdrawal symptoms are only temporary. They are strongest when you first quit but will go away within 10-14 days. When withdrawal symptoms occur, stay in control. Think about your reasons for quitting. Remind yourself that these are signs that your body is healing and getting used to being without cigarettes. Remember that withdrawal symptoms are easier to treat than the major diseases that smoking can cause.  Even after the withdrawal is over, expect periodic urges to smoke. However, these cravings are generally short lived and will go away whether you smoke or not. Do not smoke! WHAT RESOURCES ARE AVAILABLE TO HELP ME QUIT SMOKING? Your health care provider can direct you to community resources or hospitals for support, which may include:  Group support.  Education.  Hypnosis.  Therapy.   This information is not intended to replace advice given to you by your health care provider. Make sure you discuss any questions you have with your health care provider.   Document Released: 04/11/2004 Document Revised: 08/04/2014 Document Reviewed: 12/30/2012 Elsevier Interactive Patient Education 2016 [employer] Inc[employer]  

## 2016-01-08 NOTE — Addendum Note (Signed)
Encounter addended by: Benn Moulder, RN on: 01/08/2016  3:07 PM<BR>     Documentation filed: Charges VN

## 2016-01-08 NOTE — Progress Notes (Addendum)
Heather Banks here for reassessment s/p XRT to the left upper lobe.  She admits to feeling anxious today since she will get results from her  01/01/16 CT scan.  Admits to intermittent SOB which she attributes to times when she is stressed.  Also reports decreased appetite.  Has lost 8 lbs since 11/16/15.   Skin intact in tx field without any hyperpigmentation.   BP 122/84 mmHg  Pulse 93  Temp(Src) 97.7 F (36.5 C)  Ht 5' 5.5" (1.664 m)  Wt 195 lb 8 oz (88.678 kg)  BMI 32.03 kg/m2  SpO2 94%   Wt Readings from Last 3 Encounters:  01/08/16 195 lb 8 oz (88.678 kg)  11/16/15 203 lb 1.6 oz (92.126 kg)  11/09/15 204 lb 1.6 oz (92.579 kg)

## 2016-01-08 NOTE — Progress Notes (Signed)
BMS 370 Group A, Arm B1 Patient here today alone to see Dr. Julien Nordmann for follow-up to recent CT scan and review of it. I met with patient in exam room and confirmed with her our study re-consent appointment and that she continues to give consent to be followed with study. I discussed with patient all the changes to the consent and patient and I reviewed the consent and the Revised Protocol changes for Version 3, dated 11/13/15. We spent approximately 20 minutes to review and complete the entire re-consent process. After all patient's questions answered to her satisfaction and she denied having further questions, patient proceeded to initial and date each page and sign the last two pages. Patient was provided with a copy of the revised consent form she signed today for her records. I thanked patient for her time and continued support of study and encouraged her to contact Dr. Julien Nordmann or myself with any questions or concerns she may have.  Adele Dan, RN, BSN Clinical Research 01/08/2016 11:40 AM

## 2016-01-08 NOTE — Progress Notes (Signed)
[facility] [phone]   [phone]  OFFICE PROGRESS NOTE  [name]., MD [place] [address]  DIAGNOSIS: Stage IV (T2a, N0, M1a) non-small cell lung cancer, adenocarcinoma presented with bilateral pulmonary lesions. This could be also consider as synchronous primary tumors.  PRIOR THERAPY:  1) Systemic chemotherapy with carboplatin for AUC of 5 and Alimta 500 MG/M2 every 3 weeks. Status post 6 cycles. 2) Treatment according to the BMS checkmate 370 clinical trial. Randomized to group A arm B1 treatment with pemetrexed (Alimta) 500 mg/m given every 3 weeks. Status post 6 cycles discontinued secondary to disease progression. 3) Tecentriq 1200 mg IV every 3 weeks status post 6 cycles discontinued secondary to significant skin rash. 4) palliative radiotherapy to the left upper lobe lung mass under the care of Dr. Lisbeth Renshaw completed on 11/16/2015.  CURRENT THERAPY: Observation.  INTERVAL HISTORY: Heather Banks 65 y.o. female returns to the clinic today for follow-up visit. The patient is feeling fine today with no specific complaints. She continues to have the persistent skin rash on the upper and lower extremities. She was unable to see dermatology because of insurance issues. She completed a course of palliative radiotherapy to the left upper lobe lung mass and tolerated this treatment well. She denied having any significant weight loss or night sweats. She denied having any significant nausea or vomiting. She has no fever or chills. The patient denied having any significant chest pain, shortness of breath, with no cough or hemoptysis.  She had repeat CT scan of the chest, abdomen and pelvis performed recently and she is here for evaluation and discussion of her scan results.  MEDICAL HISTORY: Past Medical History  Diagnosis Date  . COPD (chronic obstructive pulmonary disease) (HCC)     Albuterol neb as needed;Pulmicort neb  daily  . GERD (gastroesophageal reflux disease)     takes Pantoprazole and Zantac daily  . Lung mass   . Pneumonia     hx of-last time about 4+yrs ago  . History of bronchitis     >5 yrs ago  . History of migraine     last one 2 wks ago  . Panic attacks     but doesn't take any meds  . Encounter for antineoplastic chemotherapy 01/21/2015  . Full code status 05/01/2015  . Adenocarcinoma of left lung, stage 4 ([facility]) 07/09/2014  . Hx of radiation therapy 10/29/2015 to 11/16/2015    The Left upper lobe was treated to 37.5 Gy in 15 fractions at 2.5 Gy per fraction    ALLERGIES:  has No Known Allergies.  MEDICATIONS:  Current Outpatient Prescriptions  Medication Sig Dispense Refill  . albuterol (PROVENTIL HFA;VENTOLIN HFA) 108 (90 BASE) MCG/ACT inhaler Inhale 2 puffs into the lungs every 6 (six) hours as needed for wheezing or shortness of breath. 1 Inhaler 6  . albuterol (PROVENTIL) (2.5 MG/3ML) 0.083% nebulizer solution Take 3 mLs (2.5 mg total) by nebulization every 6 (six) hours as needed for wheezing or shortness of breath. 75 mL 3  . ALPRAZolam (XANAX) 0.5 MG tablet Take 1 tablet (0.5 mg total) by mouth at bedtime as needed for anxiety. 30 tablet 0  . aspirin-acetaminophen-caffeine (EXCEDRIN MIGRAINE) 970-263-78 MG per tablet Take 2 tablets by mouth every 6 (six) hours as needed for headache.    . diphenhydrAMINE (BENADRYL) 25 mg capsule Take 25 mg by mouth every 6 (six) hours as needed. Reported on 10/31/2015    . Fluticasone  Furoate-Vilanterol 100-25 MCG/INH AEPB Inhale 1 puff into the lungs daily. 1 each 3  . hyaluronate sodium (RADIAPLEXRX) GEL Apply 1 application topically 2 (two) times daily.    . Hydrocortisone Acetate (LANACORT 10) 1 % CREA Apply topically. Reported on 10/31/2015    . hydrocortisone cream 0.5 % Apply 1 application topically 2 (two) times daily.    Marland Kitchen lidocaine-prilocaine (EMLA) cream Apply 1 application topically as needed. Apply 1-2 hours prior to IV treatment 30 g 1    . mirtazapine (REMERON) 30 MG tablet Take 1 tablet (30 mg total) by mouth at bedtime. 30 tablet 0  . nicotine (NICODERM CQ) 21 mg/24hr patch Place 1 patch (21 mg total) onto the skin daily. 28 patch 0  . omeprazole (PRILOSEC) 20 MG capsule TAKE 1 CAPSULE BY MOUTH TWICE DAILY BEFORE A MEAL 60 capsule 0  . Probiotic Product (PROBIOTIC DAILY PO) Take by mouth.    . prochlorperazine (COMPAZINE) 10 MG tablet Take 1 tablet (10 mg total) by mouth every 6 (six) hours as needed for nausea or vomiting. 30 tablet 0  . ranitidine (ZANTAC) 150 MG tablet Take 150 mg by mouth daily as needed for heartburn.     No current facility-administered medications for this visit.   Facility-Administered Medications Ordered in Other Visits  Medication Dose Route Frequency Provider Last Rate Last Dose  . heparin lock flush 100 unit/mL  500 Units Intracatheter Once PRN Curt Bears, MD      . sodium chloride 0.9 % injection 10 mL  10 mL Intracatheter PRN Curt Bears, MD   10 mL at 07/31/15 1635  . sodium chloride 0.9 % injection 10 mL  10 mL Intracatheter PRN Curt Bears, MD        SURGICAL HISTORY:  Past Surgical History  Procedure Laterality Date  . Abdominal hysterectomy    . Oophorectomy    . Appendectomy    . Tonsillectomy    . Video bronchoscopy with endobronchial navigation N/A 08/14/2014    Procedure: VIDEO BRONCHOSCOPY WITH ENDOBRONCHIAL NAVIGATION;  Surgeon: Rigoberto Noel, MD;  Location: [facility];  Service: Thoracic;  Laterality: N/A;  . Portacath placement  jan. 2016    REVIEW OF SYSTEMS:  A comprehensive review of systems was negative except for: Constitutional: positive for fatigue Integument/breast: positive for dryness and rash   PHYSICAL EXAMINATION: General appearance: alert, cooperative and no distress Head: Normocephalic, without obvious abnormality, atraumatic Neck: no adenopathy, no JVD, supple, symmetrical, trachea midline and thyroid not enlarged, symmetric, no  tenderness/mass/nodules Lymph nodes: Cervical, supraclavicular, and axillary nodes normal. Resp: clear to auscultation bilaterally Back: symmetric, no curvature. ROM normal. No CVA tenderness. Cardio: regular rate and rhythm, S1, S2 normal, no murmur, click, rub or gallop GI: soft, non-tender; bowel sounds normal; no masses,  no organomegaly Extremities: extremities normal, atraumatic, no cyanosis or edema Neurologic: Alert and oriented X 3, normal strength and tone. Normal symmetric reflexes. Normal coordination and gait   skin exam:  Few macular lesions on the upper and lower extremities.     ECOG PERFORMANCE STATUS: 1 - Symptomatic but completely ambulatory  Blood pressure 125/82, pulse 85, temperature 98.2 F (36.8 C), temperature source Oral, resp. rate 18, height 5' 5.5" (1.664 m), weight 194 lb 9.6 oz (88.27 kg), SpO2 95 %.  LABORATORY DATA: Lab Results  Component Value Date   WBC 6.2 01/01/2016   HGB 13.9 01/01/2016   HCT 42.2 01/01/2016   MCV 93.0 01/01/2016   PLT 297 01/01/2016  Chemistry      Component Value Date/Time   NA 140 01/01/2016 1050   NA 138 08/11/2014 0837   K 3.7 01/01/2016 1050   K 3.8 08/11/2014 0837   CL 103 08/11/2014 0837   CO2 25 01/01/2016 1050   CO2 23 08/11/2014 0837   BUN 7.6 01/01/2016 1050   BUN 8 08/11/2014 0837   CREATININE 0.8 01/01/2016 1050   CREATININE 0.57 08/11/2014 0837      Component Value Date/Time   CALCIUM 9.3 01/01/2016 1050   CALCIUM 9.0 08/11/2014 0837   ALKPHOS 113 01/01/2016 1050   ALKPHOS 145* 07/10/2014 0459   AST 15 01/01/2016 1050   AST 11 07/10/2014 0459   ALT 14 01/01/2016 1050   ALT 8 07/10/2014 0459   BILITOT <0.30 01/01/2016 1050   BILITOT <0.2* 07/10/2014 0459       RADIOGRAPHIC STUDIES: Ct Chest W Contrast  01/01/2016  CLINICAL DATA:  Stage IV left upper lobe non-small cell lung carcinoma. Chemotherapy, immunotherapy finished. Radiation therapy finished in April 2017. EXAM: CT CHEST,  ABDOMEN, AND PELVIS WITH CONTRAST TECHNIQUE: Multidetector CT imaging of the chest, abdomen and pelvis was performed following the standard protocol during bolus administration of intravenous contrast. CONTRAST:  171m ISOVUE-300 IOPAMIDOL (ISOVUE-300) INJECTION 61% COMPARISON:  Multiple exams, including 10/01/2015 FINDINGS: CT CHEST FINDINGS Mediastinum/Nodes: Coronary, aortic arch, and branch vessel atherosclerotic vascular disease. An AP window lymph node has essentially resolved, 3 mm in short axis diameter on image 16/2, previously 12 mm. A prevascular lymph node formerly measuring 0.9 cm on image 21/2 of the prior exam currently measures 4 mm in short axis on image 20/2 today. There is a small filling defect in the lateral basilar segmental pulmonary artery on the left, favoring a small pulmonary embolus, shown on image 28/2. This was not present previously. Today' s exam was not performed as a comprehensive pulmonary embolus protocol exam and less might be expected to have a reduced positive predictive value, but the filling defect is fairly convincing. The right ventricular to left ventricular ratio is 1.0, which may be associated with right heart strain. That said, it was also elevated previously Lungs/Pleura: Left upper lobe mass currently measures 3.3 by 3.0 by 4.3 cm (volume = 22.1 cc) on image 28/4, formerly 5.2 by 4.6 by 6.1 cm (volume = 75.9 cc). The reduced surrounding postobstructive pneumonitis. Underlying emphysema is present. Stable 3 by 4 mm left lower lobe nodule, image 63/4. Biapical pleural parenchymal scarring, stable. Multilobular right upper lobe pulmonary nodule 1.8 by 1.7 cm on image 36/4, previously 1.9 by 1.9 cm. Stable appearance of confluent interstitial and faint nodular opacity superiorly in the right upper lobe with bullous components, currently measuring 3.3 by 2.3 cm on image 48/4, no change. There is plugging of multiple airways in both lower lobes. Musculoskeletal: Thoracic  spondylosis. CT ABDOMEN PELVIS FINDINGS Hepatobiliary: Unremarkable Pancreas: Unremarkable Spleen: Unremarkable Adrenals/Urinary Tract: 2.2 cm benign right renal cyst, fluid density. Several other hypodense lesions the right kidney lower pole are stable and likely cysts, but technically too small to characterize. Otherwise unremarkable. 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 Aortoiliac atherosclerotic vascular disease. No pathologic abdominal adenopathy identified. Reproductive: Hysterectomy. Right ovary normal. Left ovary not well seen. Other: No supplemental non-categorized findings. Musculoskeletal: Unremarkable. IMPRESSION: 1. Small pulmonary embolus in the left lateral basal segment pulmonary artery, new compared to 10/01/15. Positive for acute PE with CT evidence of right heart strain (RV/LV Ratio = 1.0) which may reflect  submassive (intermediate risk) PE -although the right ventricular to left ventricular ratio was also a elevated on prior exams in which the pulmonary embolus was not present. The presence of right heart strain has been associated with an increased risk of morbidity and mortality. Consider activating Code PE by paging [phone]. 2. Interval 71% reduction in volume of the left upper lobe mass. Current size 3.3 by 3.0 by 4.3 cm. 3. Prior pathologic mediastinal adenopathy has resolved. 4. The right-sided lung nodules are stable. 5. Other imaging findings of potential clinical significance: Coronary, aortic arch, and branch vessel atherosclerotic vascular disease. Emphysema. Airway plugging in both lower lobes. Infrarenal abdominal aortic ectasia. Aortoiliac atherosclerotic vascular disease. Critical Value/emergent results were called by telephone at the time of interpretation on 01/01/2016 at 1:04 pm to Dr. Curt Bears , who verbally acknowledged these results. Electronically Signed   By: Van Clines M.D.   On: 01/01/2016  13:07   Ct Abdomen Pelvis W Contrast  01/01/2016  CLINICAL DATA:  Stage IV left upper lobe non-small cell lung carcinoma. Chemotherapy, immunotherapy finished. Radiation therapy finished in April 2017. EXAM: CT CHEST, ABDOMEN, AND PELVIS WITH CONTRAST TECHNIQUE: Multidetector CT imaging of the chest, abdomen and pelvis was performed following the standard protocol during bolus administration of intravenous contrast. CONTRAST:  155m ISOVUE-300 IOPAMIDOL (ISOVUE-300) INJECTION 61% COMPARISON:  Multiple exams, including 10/01/2015 FINDINGS: CT CHEST FINDINGS Mediastinum/Nodes: Coronary, aortic arch, and branch vessel atherosclerotic vascular disease. An AP window lymph node has essentially resolved, 3 mm in short axis diameter on image 16/2, previously 12 mm. A prevascular lymph node formerly measuring 0.9 cm on image 21/2 of the prior exam currently measures 4 mm in short axis on image 20/2 today. There is a small filling defect in the lateral basilar segmental pulmonary artery on the left, favoring a small pulmonary embolus, shown on image 28/2. This was not present previously. Today' s exam was not performed as a comprehensive pulmonary embolus protocol exam and less might be expected to have a reduced positive predictive value, but the filling defect is fairly convincing. The right ventricular to left ventricular ratio is 1.0, which may be associated with right heart strain. That said, it was also elevated previously Lungs/Pleura: Left upper lobe mass currently measures 3.3 by 3.0 by 4.3 cm (volume = 22.1 cc) on image 28/4, formerly 5.2 by 4.6 by 6.1 cm (volume = 75.9 cc). The reduced surrounding postobstructive pneumonitis. Underlying emphysema is present. Stable 3 by 4 mm left lower lobe nodule, image 63/4. Biapical pleural parenchymal scarring, stable. Multilobular right upper lobe pulmonary nodule 1.8 by 1.7 cm on image 36/4, previously 1.9 by 1.9 cm. Stable appearance of confluent interstitial and faint  nodular opacity superiorly in the right upper lobe with bullous components, currently measuring 3.3 by 2.3 cm on image 48/4, no change. There is plugging of multiple airways in both lower lobes. Musculoskeletal: Thoracic spondylosis. CT ABDOMEN PELVIS FINDINGS Hepatobiliary: Unremarkable Pancreas: Unremarkable Spleen: Unremarkable Adrenals/Urinary Tract: 2.2 cm benign right renal cyst, fluid density. Several other hypodense lesions the right kidney lower pole are stable and likely cysts, but technically too small to characterize. Otherwise unremarkable. 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 Aortoiliac atherosclerotic vascular disease. No pathologic abdominal adenopathy identified. Reproductive: Hysterectomy. Right ovary normal. Left ovary not well seen. Other: No supplemental non-categorized findings. Musculoskeletal: Unremarkable. IMPRESSION: 1. Small pulmonary embolus in the left lateral basal segment pulmonary artery, new compared to 10/01/15. Positive for acute PE with CT evidence of  right heart strain (RV/LV Ratio = 1.0) which may reflect submassive (intermediate risk) PE -although the right ventricular to left ventricular ratio was also a elevated on prior exams in which the pulmonary embolus was not present. The presence of right heart strain has been associated with an increased risk of morbidity and mortality. Consider activating Code PE by paging [phone]. 2. Interval 71% reduction in volume of the left upper lobe mass. Current size 3.3 by 3.0 by 4.3 cm. 3. Prior pathologic mediastinal adenopathy has resolved. 4. The right-sided lung nodules are stable. 5. Other imaging findings of potential clinical significance: Coronary, aortic arch, and branch vessel atherosclerotic vascular disease. Emphysema. Airway plugging in both lower lobes. Infrarenal abdominal aortic ectasia. Aortoiliac atherosclerotic vascular disease. Critical Value/emergent  results were called by telephone at the time of interpretation on 01/01/2016 at 1:04 pm to Dr. Curt Bears , who verbally acknowledged these results. Electronically Signed   By: Van Clines M.D.   On: 01/01/2016 13:07    ASSESSMENT AND PLAN: This is a very pleasant 65 years old white female with stage IV non-small cell lung cancer currently undergoing systemic chemotherapy with carboplatin and Alimta status post 6 cycles.  She she completed a course of maintenance chemotherapy with single agent Alimta status post 6 cycles and tolerating her treatment fairly well but this was discontinued secondary to disease progression.  The patient is currently undergoing treatment with immunotherapy with Tecentriq Huey Bienenstock) status post 6 cycles. She is tolerating her treatment fairly well except for grade 2 macular skin rash on the upper and lower extremities. She also underwent palliative radiotherapy to the left upper lobe lung mass. Restaging CT scan of the chest, abdomen pelvis performed on 01/01/2016 showed significant improvement in her disease especially the left upper lobe lung mass and mediastinal lymphadenopathy. I discussed the scan results and showed the images to the patient today. I recommended for her to continue on observation with repeat CT scan of the chest, abdomen and pelvis in 3 months. For the persistent skin rash, the patient was treated with a tapered dose of prednisone but unfortunately she continues to have residual or skin rash on the lower extremities and to lesser extent in the upper extremities. I will give her a prescription for hydrocortisone 1% ointment to be applied to the skin rash area. For smoke cessation, I strongly encouraged the patient to quit smoking. For depression, she will continue on Remeron 30 mg by mouth daily at bedtime. For anxiety, she will continue on Xanax. For the acid reflux, the patient will continue Prilosec 20 mg by mouth twice a day. CODE  STATUS: FULL CODE STATUS but no prolonged support if she is in a vegetative state. She was advised to call immediately if she has any concerning symptoms in the interval. The patient voices understanding of current disease status and treatment options and is in agreement with the current care plan.  All questions were answered. The patient knows to call the clinic with any problems, questions or concerns. We can certainly see the patient much sooner if necessary.  Disclaimer: This note was dictated with voice recognition software. Similar sounding words can inadvertently be transcribed and may not be corrected upon review.

## 2016-01-09 ENCOUNTER — Telehealth: Payer: Self-pay | Admitting: Internal Medicine

## 2016-01-09 NOTE — Telephone Encounter (Signed)
lvm for pt regarding to Sept appt.... °

## 2016-01-18 ENCOUNTER — Inpatient Hospital Stay (HOSPITAL_COMMUNITY): Payer: Medicaid - Out of State

## 2016-01-18 ENCOUNTER — Inpatient Hospital Stay (HOSPITAL_COMMUNITY)
Admission: AD | Admit: 2016-01-18 | Discharge: 2016-01-22 | DRG: 190 | Disposition: A | Discharge status: Home / Self Care | Payer: Medicaid - Out of State | Source: Other Acute Inpatient Hospital | Attending: Pulmonary Disease | Admitting: Pulmonary Disease

## 2016-01-18 ENCOUNTER — Other Ambulatory Visit: Payer: Self-pay

## 2016-01-18 DIAGNOSIS — Z79899 Other long term (current) drug therapy: Secondary | ICD-10-CM

## 2016-01-18 DIAGNOSIS — K219 Gastro-esophageal reflux disease without esophagitis: Secondary | ICD-10-CM | POA: Diagnosis present

## 2016-01-18 DIAGNOSIS — I2699 Other pulmonary embolism without acute cor pulmonale: Secondary | ICD-10-CM | POA: Diagnosis present

## 2016-01-18 DIAGNOSIS — M7989 Other specified soft tissue disorders: Secondary | ICD-10-CM

## 2016-01-18 DIAGNOSIS — Z9221 Personal history of antineoplastic chemotherapy: Secondary | ICD-10-CM

## 2016-01-18 DIAGNOSIS — J9690 Respiratory failure, unspecified, unspecified whether with hypoxia or hypercapnia: Secondary | ICD-10-CM | POA: Diagnosis present

## 2016-01-18 DIAGNOSIS — J9601 Acute respiratory failure with hypoxia: Secondary | ICD-10-CM

## 2016-01-18 DIAGNOSIS — G9340 Encephalopathy, unspecified: Secondary | ICD-10-CM | POA: Diagnosis present

## 2016-01-18 DIAGNOSIS — Z923 Personal history of irradiation: Secondary | ICD-10-CM | POA: Diagnosis not present

## 2016-01-18 DIAGNOSIS — J441 Chronic obstructive pulmonary disease with (acute) exacerbation: Principal | ICD-10-CM | POA: Diagnosis present

## 2016-01-18 DIAGNOSIS — F17210 Nicotine dependence, cigarettes, uncomplicated: Secondary | ICD-10-CM | POA: Diagnosis present

## 2016-01-18 DIAGNOSIS — F410 Panic disorder [episodic paroxysmal anxiety] without agoraphobia: Secondary | ICD-10-CM | POA: Diagnosis present

## 2016-01-18 DIAGNOSIS — I10 Essential (primary) hypertension: Secondary | ICD-10-CM | POA: Diagnosis present

## 2016-01-18 DIAGNOSIS — C3490 Malignant neoplasm of unspecified part of unspecified bronchus or lung: Secondary | ICD-10-CM | POA: Diagnosis present

## 2016-01-18 DIAGNOSIS — J9622 Acute and chronic respiratory failure with hypercapnia: Secondary | ICD-10-CM | POA: Diagnosis present

## 2016-01-18 DIAGNOSIS — R739 Hyperglycemia, unspecified: Secondary | ICD-10-CM | POA: Diagnosis present

## 2016-01-18 DIAGNOSIS — J9621 Acute and chronic respiratory failure with hypoxia: Secondary | ICD-10-CM | POA: Diagnosis present

## 2016-01-18 DIAGNOSIS — J9602 Acute respiratory failure with hypercapnia: Secondary | ICD-10-CM

## 2016-01-18 DIAGNOSIS — J189 Pneumonia, unspecified organism: Secondary | ICD-10-CM | POA: Diagnosis present

## 2016-01-18 DIAGNOSIS — J440 Chronic obstructive pulmonary disease with acute lower respiratory infection: Secondary | ICD-10-CM | POA: Diagnosis present

## 2016-01-18 DIAGNOSIS — J449 Chronic obstructive pulmonary disease, unspecified: Secondary | ICD-10-CM

## 2016-01-18 LAB — COMPREHENSIVE METABOLIC PANEL
ALBUMIN: 3.8 g/dL (ref 3.5–5.0)
ALT: 23 U/L (ref 14–54)
ANION GAP: 8 (ref 5–15)
AST: 25 U/L (ref 15–41)
Alkaline Phosphatase: 140 U/L — ABNORMAL HIGH (ref 38–126)
BUN: 9 mg/dL (ref 6–20)
CHLORIDE: 106 mmol/L (ref 101–111)
CO2: 27 mmol/L (ref 22–32)
Calcium: 9 mg/dL (ref 8.9–10.3)
Creatinine, Ser: 0.58 mg/dL (ref 0.44–1.00)
GFR calc Af Amer: >60 mL/min (ref >60)
GFR calc non Af Amer: >60 mL/min (ref >60)
GLUCOSE: 162 mg/dL — AB (ref 65–99)
POTASSIUM: 4.4 mmol/L (ref 3.5–5.1)
SODIUM: 141 mmol/L (ref 135–145)
Total Bilirubin: 0.3 mg/dL (ref 0.3–1.2)
Total Protein: 7.4 g/dL (ref 6.5–8.1)

## 2016-01-18 LAB — CBC WITH DIFFERENTIAL/PLATELET
BASOS ABS: 0 10*3/uL (ref 0.0–0.1)
Basophils Relative: 0 %
EOS ABS: 0 10*3/uL (ref 0.0–0.7)
EOS PCT: 0 %
HCT: 42.7 % (ref 36.0–46.0)
Hemoglobin: 13.3 g/dL (ref 12.0–15.0)
Lymphocytes Relative: 3 %
Lymphs Abs: 0.3 10*3/uL — ABNORMAL LOW (ref 0.7–4.0)
MCH: 30.3 pg (ref 26.0–34.0)
MCHC: 31.1 g/dL (ref 30.0–36.0)
MCV: 97.3 fL (ref 78.0–100.0)
Monocytes Absolute: 0.1 10*3/uL (ref 0.1–1.0)
Monocytes Relative: 1 %
Neutro Abs: 8.8 10*3/uL — ABNORMAL HIGH (ref 1.7–7.7)
Neutrophils Relative %: 96 %
PLATELETS: 274 10*3/uL (ref 150–400)
RBC: 4.39 MIL/uL (ref 3.87–5.11)
RDW: 16.2 % — AB (ref 11.5–15.5)
WBC: 9.1 10*3/uL (ref 4.0–10.5)

## 2016-01-18 LAB — POCT I-STAT 3, ART BLOOD GAS (G3+)
BICARBONATE: 30.4 meq/L — AB (ref 20.0–24.0)
O2 Saturation: 98 %
PCO2 ART: 78.9 mmHg — AB (ref 35.0–45.0)
Patient temperature: 98.5
TCO2: 33 mmol/L (ref 0–100)
pH, Arterial: 7.194 — CL (ref 7.350–7.450)
pO2, Arterial: 131 mmHg — ABNORMAL HIGH (ref 80.0–100.0)

## 2016-01-18 LAB — MAGNESIUM: Magnesium: 2 mg/dL (ref 1.7–2.4)

## 2016-01-18 LAB — TROPONIN I

## 2016-01-18 LAB — PROTIME-INR
INR: 1.08 (ref 0.00–1.49)
PROTHROMBIN TIME: 14.2 s (ref 11.6–15.2)

## 2016-01-18 LAB — PHOSPHORUS: Phosphorus: 4.2 mg/dL (ref 2.5–4.6)

## 2016-01-18 LAB — GLUCOSE, CAPILLARY
GLUCOSE-CAPILLARY: 247 mg/dL — AB (ref 65–99)
GLUCOSE-CAPILLARY: 220 mg/dL — AB (ref 65–99)

## 2016-01-18 LAB — MRSA PCR SCREENING: MRSA by PCR: NEGATIVE

## 2016-01-18 LAB — APTT: APTT: 33 s (ref 24–37)

## 2016-01-18 LAB — PROCALCITONIN

## 2016-01-18 LAB — BRAIN NATRIURETIC PEPTIDE: B NATRIURETIC PEPTIDE 5: 85.2 pg/mL (ref 0.0–100.0)

## 2016-01-18 LAB — LACTIC ACID, PLASMA: Lactic Acid, Venous: 1 mmol/L (ref 0.5–2.0)

## 2016-01-18 MED ORDER — IPRATROPIUM-ALBUTEROL 0.5-2.5 (3) MG/3ML IN SOLN
RESPIRATORY_TRACT | Status: AC
  Filled 2016-01-18: qty 3

## 2016-01-18 MED ORDER — VANCOMYCIN HCL IN DEXTROSE 1-5 GM/200ML-% IV SOLN
1000.0000 mg | Freq: Two times a day (BID) | INTRAVENOUS | Status: DC
  Administered 2016-01-18 – 2016-01-20 (×4): 1000 mg via INTRAVENOUS
  Filled 2016-01-18 (×5): qty 200

## 2016-01-18 MED ORDER — BUDESONIDE 0.5 MG/2ML IN SUSP
0.5000 mg | Freq: Once | RESPIRATORY_TRACT | Status: AC
  Administered 2016-01-18: 0.5 mg via RESPIRATORY_TRACT
  Filled 2016-01-18: qty 2

## 2016-01-18 MED ORDER — SODIUM CHLORIDE 0.9 % IV SOLN: 250.0000 mL | INTRAVENOUS | Status: DC | PRN

## 2016-01-18 MED ORDER — DEXTROSE-NACL 5-0.45 % IV SOLN
INTRAVENOUS | Status: DC
Start: 2016-01-18 — End: 2016-01-19
  Administered 2016-01-18 – 2016-01-19 (×2): via INTRAVENOUS

## 2016-01-18 MED ORDER — BUDESONIDE 0.5 MG/2ML IN SUSP
0.5000 mg | Freq: Two times a day (BID) | RESPIRATORY_TRACT | Status: DC
  Administered 2016-01-18 – 2016-01-22 (×8): 0.5 mg via RESPIRATORY_TRACT
  Filled 2016-01-18 (×8): qty 2

## 2016-01-18 MED ORDER — DEXMEDETOMIDINE HCL IN NACL 400 MCG/100ML IV SOLN
0.4000 ug/kg/h | INTRAVENOUS | Status: DC
  Administered 2016-01-18 (×2): 0.4 ug/kg/h via INTRAVENOUS
  Administered 2016-01-19: 0.5 ug/kg/h via INTRAVENOUS
  Filled 2016-01-18 (×2): qty 50
  Filled 2016-01-18 (×3): qty 100

## 2016-01-18 MED ORDER — IPRATROPIUM-ALBUTEROL 0.5-2.5 (3) MG/3ML IN SOLN
RESPIRATORY_TRACT | Status: AC
  Administered 2016-01-18: 3 mL via RESPIRATORY_TRACT
  Filled 2016-01-18: qty 6

## 2016-01-18 MED ORDER — IPRATROPIUM-ALBUTEROL 0.5-2.5 (3) MG/3ML IN SOLN
3.0000 mL | RESPIRATORY_TRACT | Status: AC
  Administered 2016-01-18 (×2): 3 mL via RESPIRATORY_TRACT

## 2016-01-18 MED ORDER — CETYLPYRIDINIUM CHLORIDE 0.05 % MT LIQD
7.0000 mL | Freq: Two times a day (BID) | OROMUCOSAL | Status: DC
  Administered 2016-01-18 – 2016-01-19 (×2): 7 mL via OROMUCOSAL

## 2016-01-18 MED ORDER — INSULIN ASPART 100 UNIT/ML ~~LOC~~ SOLN
0.0000 [IU] | SUBCUTANEOUS | Status: DC
Start: 2016-01-18 — End: 2016-01-20
  Administered 2016-01-18 (×2): 7 [IU] via SUBCUTANEOUS
  Administered 2016-01-19 (×2): 3 [IU] via SUBCUTANEOUS
  Administered 2016-01-19: 4 [IU] via SUBCUTANEOUS
  Administered 2016-01-19: 3 [IU] via SUBCUTANEOUS
  Administered 2016-01-19: 4 [IU] via SUBCUTANEOUS
  Administered 2016-01-20 (×2): 3 [IU] via SUBCUTANEOUS

## 2016-01-18 MED ORDER — CHLORHEXIDINE GLUCONATE 0.12 % MT SOLN
15.0000 mL | Freq: Two times a day (BID) | OROMUCOSAL | Status: DC
  Administered 2016-01-18 – 2016-01-19 (×2): 15 mL via OROMUCOSAL

## 2016-01-18 MED ORDER — IPRATROPIUM-ALBUTEROL 0.5-2.5 (3) MG/3ML IN SOLN
3.0000 mL | RESPIRATORY_TRACT | Status: DC
  Administered 2016-01-18 – 2016-01-22 (×24): 3 mL via RESPIRATORY_TRACT
  Filled 2016-01-18 (×24): qty 3

## 2016-01-18 MED ORDER — ENOXAPARIN SODIUM 40 MG/0.4ML ~~LOC~~ SOLN
40.0000 mg | SUBCUTANEOUS | Status: DC
  Filled 2016-01-18: qty 0.4

## 2016-01-18 MED ORDER — IPRATROPIUM-ALBUTEROL 0.5-2.5 (3) MG/3ML IN SOLN
3.0000 mL | Freq: Once | RESPIRATORY_TRACT | Status: AC
  Administered 2016-01-18: 15:00:00 via RESPIRATORY_TRACT

## 2016-01-18 MED ORDER — ALBUTEROL SULFATE (2.5 MG/3ML) 0.083% IN NEBU
2.5000 mg | INHALATION_SOLUTION | RESPIRATORY_TRACT | Status: DC | PRN
  Administered 2016-01-18: 2.5 mg via RESPIRATORY_TRACT
  Filled 2016-01-18: qty 3

## 2016-01-18 MED ORDER — PIPERACILLIN-TAZOBACTAM 3.375 G IVPB
3.3750 g | Freq: Three times a day (TID) | INTRAVENOUS | Status: DC
  Administered 2016-01-18 – 2016-01-20 (×5): 3.375 g via INTRAVENOUS
  Filled 2016-01-18 (×7): qty 50

## 2016-01-18 MED ORDER — METHYLPREDNISOLONE SODIUM SUCC 40 MG IJ SOLR
40.0000 mg | Freq: Four times a day (QID) | INTRAMUSCULAR | Status: DC
  Administered 2016-01-18 – 2016-01-20 (×7): 40 mg via INTRAVENOUS
  Filled 2016-01-18 (×8): qty 1

## 2016-01-18 NOTE — Progress Notes (Signed)
eLink Physician-Brief Progress Note Patient Name: Heather Banks DOB: 1951/04/23 MRN: [id number]   Date of Service  01/18/2016  HPI/Events of Note  65 yo female arrives in transfer from [place], [place]. Appears to have AECOPD. Currently on BiPAP 40%/IPAP 12/EPAP 6 with sat = 98 and RR = 25. She looks labored and nurse states that she is very tight and pursing her lips to aid her breathing.    eICU Interventions  Will order: 1. Albuterol Neb 2.5 mg Q 3 hours PRN SOB or wheezing. 2. ABG STAT. 3. BiPAP orders written. 4. Dr Corrie Dandy notified of admission. He is on his way to evaluate her at the bedside.     Intervention Category Intermediate Interventions: Respiratory distress - evaluation and management  Sommer,Steven Cornelia Copa 01/18/2016, 3:53 PM

## 2016-01-18 NOTE — H&P (Signed)
PULMONARY / CRITICAL CARE MEDICINE   Name: Heather Banks MRN: [id number] DOB: Oct 03, 1950    ADMISSION DATE:  01/18/2016 CONSULTATION DATE:  01/18/2016  REFERRING MD:  Dr. Glade Banks from [facility] [place] and [place]:  SOB  HISTORY OF PRESENT ILLNESS:   Pt was transferred to [facility] ICU for further management of her acute on chronic resp Fx/AECOPD.   History is limited as pt is encephalopathic. Chart review was done.   Pt with severe COPD (FEV1  1.21  Or 46%), with Stage IV (T2a, N0, M1a) non-small cell lung cancer, adenocarcinoma presented with bilateral pulmonary lesions, not on O2, presented to  ED for 3 day h/o SOB.   She usually sees Dr. Julien Banks for her lung CA. It appears, patient had progression of her lung cancer for which she had palliative radiation which finished in April 2017. Prior to that, she finished 6 cycles of chemotherapy. On her last follow-up with Dr. Earlie Banks in June 2017, patient was relatively well and repeat chest CT scan showed stable left upper lung lesion.  As mentioned, patient presented to the emergency room with acute on chronic dyspnea. Blood pressure was 170/95, pulse of 120, respiratory rate of 30s, 72% on room air. She was placed on BiPAP. O2 sats were 97%. Chest CT scan done which was personally reviewed showed similar to the findings in the January 01, 2016 chest CT scan done at [facility] except for the fact that there is a right lower lobe infiltrate and no evidence of filling defect.. Initial blood work showed a white count of 12. Her electrolytes were within normal. Initial ABG showed pH of 7.25, a CO2 of 62, PO2 45, unknown FiO2.  Because she goes to [facility] for lung cancer doctor, it was decided to transfer her to [facility].  On arrival to ICU, she was noted to be in respiratory distress. She was very tight. She has received 2 doses of albuterol. She was also pulling off her BiPAP. She had to be restrained. We ended up  giving her another 2 doses of DuoNeb. We were able to speak with her daughter, Heather Banks. Heather Banks said patient did not want to be intubated but was okay on the BiPAP. Patient wants everything done except intubation.   PAST MEDICAL HISTORY :  She  has a past medical history of COPD (chronic obstructive pulmonary disease) ([place]); GERD (gastroesophageal reflux disease); Lung mass; Pneumonia; History of bronchitis; History of migraine; Panic attacks; Encounter for antineoplastic chemotherapy (01/21/2015); Full code status (05/01/2015); Adenocarcinoma of left lung, stage 4 ([place]) (07/09/2014); and radiation therapy (10/29/2015 to 11/16/2015).  PAST SURGICAL HISTORY: She  has past surgical history that includes Abdominal hysterectomy; Oophorectomy; Appendectomy; Tonsillectomy; Video bronchoscopy with endobronchial navigation (N/A, 08/14/2014); and Portacath placement (jan. 2016).  No Known Allergies  Current Facility-Administered Medications on File Prior to Encounter  Medication  . heparin lock flush 100 unit/mL  . sodium chloride 0.9 % injection 10 mL  . sodium chloride 0.9 % injection 10 mL   Current Outpatient Prescriptions on File Prior to Encounter  Medication Sig  . albuterol (PROVENTIL HFA;VENTOLIN HFA) 108 (90 BASE) MCG/ACT inhaler Inhale 2 puffs into the lungs every 6 (six) hours as needed for wheezing or shortness of breath.  Marland Kitchen albuterol (PROVENTIL) (2.5 MG/3ML) 0.083% nebulizer solution Take 3 mLs (2.5 mg total) by nebulization every 6 (six) hours as needed for wheezing or shortness of breath. (Patient not taking: Reported on 01/08/2016)  . ALPRAZolam (  XANAX) 0.5 MG tablet Take 1 tablet (0.5 mg total) by mouth at bedtime as needed for anxiety.  Marland Kitchen aspirin-acetaminophen-caffeine (EXCEDRIN MIGRAINE) 250-250-65 MG per tablet Take 2 tablets by mouth every 6 (six) hours as needed for headache.  . diphenhydrAMINE (BENADRYL) 25 mg capsule Take 25 mg by mouth every 6 (six) hours as needed. Reported on  01/08/2016  . Fluticasone Furoate-Vilanterol 100-25 MCG/INH AEPB Inhale 1 puff into the lungs daily.  . hyaluronate sodium (RADIAPLEXRX) GEL Apply 1 application topically 2 (two) times daily.  . hydrocortisone 1 % ointment Apply 1 application topically 2 (two) times daily.  . Hydrocortisone Acetate (LANACORT 10) 1 % CREA Apply topically. Reported on 01/08/2016  . lidocaine-prilocaine (EMLA) cream Apply 1 application topically as needed. Apply 1-2 hours prior to IV treatment  . mirtazapine (REMERON) 30 MG tablet Take 1 tablet (30 mg total) by mouth at bedtime.  . nicotine (NICODERM CQ) 21 mg/24hr patch Place 1 patch (21 mg total) onto the skin daily. (Patient not taking: Reported on 01/08/2016)  . omeprazole (PRILOSEC) 20 MG capsule TAKE 1 CAPSULE BY MOUTH TWICE DAILY BEFORE A MEAL  . Probiotic Product (PROBIOTIC DAILY PO) Take by mouth.  . prochlorperazine (COMPAZINE) 10 MG tablet Take 1 tablet (10 mg total) by mouth every 6 (six) hours as needed for nausea or vomiting.  . ranitidine (ZANTAC) 150 MG tablet Take 150 mg by mouth daily as needed for heartburn.    FAMILY HISTORY:  Her indicated that her mother is alive. She indicated that her father is deceased.  Mother has dementia.   SOCIAL HISTORY: She  reports that she has been smoking Cigarettes.  She has been smoking about 0.00 packs per day for the past 44 years. She has never used smokeless tobacco. She reports that she does not drink alcohol or use illicit drugs.   Patient has been smoking all her life. She continues to smoke a pack a day. Denies drinking alcohol. She is a widow. Has 3 children. She lives with a friend. Independent in her activities of daily living.  REVIEW OF SYSTEMS:   As mentioned. Fair functional capacity. Recent dyspnea, cough, wheezing, shortness of breath. Rest of review systems was done, 14 point system, everything else not mentioned was negative.  SUBJECTIVE:  As mentioned.  VITAL SIGNS: BP 152/88 mmHg  Pulse  108  Temp(Src) 97.7 F (36.5 C) (Oral)  Resp 27  SpO2 97%  HEMODYNAMICS:    VENTILATOR SETTINGS: Vent Mode:  [-] BIPAP FiO2 (%):  [40 %-50 %] 40 % Set Rate:  [12 bmp] 12 bmp PEEP:  [6 cmH20] 6 cmH20 Pressure Support:  [6 cmH20] 6 cmH20  INTAKE / OUTPUT:    PHYSICAL EXAMINATION: General:  Awake. Confused. Agitated. Wearing BiPAP. In mild respiratory distress. Neuro:  Cranial nerves grossly intact. Moves all extremities equally. No lateralizing sign seen. HEENT:  PERLA, (-) NVD, (-) oral thrush seen.  Cardiovascular:  Tachycardic. Good S1 and S2. No S3 murmur rub or gallop. Lungs:  In mild respiratory distress. [place] air entry. Wheezing bilaterally. No crackles or rhonchi heard. Abdomen:  Positive bowel sounds. Soft. Obese. No masses or tenderness. Musculoskeletal:  Rate 1 edema. No clubbing or cyanosis. Skin:  She has chronic erythematous maculopapular rash in both her lower extremities since getting immunotherapy for cancer. They seem to be stable. Didn't look like they're infected.  LABS:  BMET No results for input(s): NA, K, CL, CO2, BUN, CREATININE, GLUCOSE in the last 168 hours.  Electrolytes No  results for input(s): CALCIUM, MG, PHOS in the last 168 hours.  CBC No results for input(s): WBC, HGB, HCT, PLT in the last 168 hours.  Coag's No results for input(s): APTT, INR in the last 168 hours.  Sepsis Markers No results for input(s): LATICACIDVEN, PROCALCITON, O2SATVEN in the last 168 hours.  ABG  Recent Labs Lab 01/18/16 1614  PHART 7.194*  PCO2ART 78.9*  PO2ART 131.0*    Liver Enzymes No results for input(s): AST, ALT, ALKPHOS, BILITOT, ALBUMIN in the last 168 hours.  Cardiac Enzymes No results for input(s): TROPONINI, PROBNP in the last 168 hours.  Glucose No results for input(s): GLUCAP in the last 168 hours.  Imaging No results found.   STUDIES:    CULTURES: MRSA 6/23 > Blood 6/23 > Urine 6/23 >   ANTIBIOTICS: Zosyn 6/23 > Vanc  6/23 >   SIGNIFICANT EVENTS: 6/23 pt admitted for AECOPD/HCAP  LINES/TUBES: R ant port   DISCUSSION: 69 female, known to have severe COPD, known to have stage IV lung cancer ( for which she finished palliative radiation in April), continues to smoke cigarettes, admitted for severe acute exacerbation of COPD and right lower lobe pneumonia. Currently on BiPAP. She is a DO NOT INTUBATE.  ASSESSMENT / PLAN:  PULMONARY A: Acute on chronic hypoxemic hypercapnic respiratory failure secondary to severe acute COPD exacerbation, right lower lobe healthcare associated pneumonia. Patient with severe COPD, FEV1  1.21 or 46% Stage IV lung cancer. She has both masses in the left upper lung and right upper lung. Status post palliative radiation which finished in April 2017. Tobacco user. P:   Patient is a DO NOT INTUBATE. Continue current BiPAP. We adjusted the settings so that she has tidal volume running in the 500 ML's. Continue Medrol 40 mg IV every 6 hours. Atrovent every 4 hours, Pulmicort twice a day. She is status post 2 DuoNeb treatments. We will give her another 2 rounds to complete an hour of treatment because of her severe wheezing. Antibiotics as discussed below.  CARDIOVASCULAR A:  Sinus tachycardia secondary to above. EKG with recent lateral infarct. Hypertension. P:  Trend troponin. May need a 2-D echo. Observe blood pressure.  RENAL A:   No issues.  P:   BNP this am was U/R Repeat electrolytes. IVF D5 1/2 NS 100 mls/hr   GASTROINTESTINAL A:   GERD P:   NPO Assess whether we can take her off the BiPAP in the morning.  Keep on IVF.    HEMATOLOGIC A:   Stage IV (T2a, N0, M1a) non-small cell lung cancer, adenocarcinoma  P:  Pt sees Dr. Earlie Banks.  Last seen in 12/2015. Per his note, patient had palliative radiation of the left upper lung mass since it was seen to be progressing. Palliative radiation finished in April 2017. Patient is status post 6 cycles of  chemotherapy before that. Chest CT scan in June 2017 showed stable left upper lung mass.  INFECTIOUS A:   Right lower lobe pneumonia in immunocompromised host.  P:   Zosyn and vanc pending cultures Check lactate, PCT  ENDOCRINE A:   No issues P:   CBG q 4 while NPO. SSI  NEUROLOGIC A:   Anxiety  P:   RASS goal: 0 Try patient on Precedex.   FAMILY  - Updates: I extensively discussed the case with patient's daughter, [name]. Discussed with her the critical nature of patient's disease. She reiterated that patient is a full code except for intubation. I told her we will  see the patient does the next 12-24 hours.  - Inter-disciplinary family meet or Palliative Care meeting due by:  01/25/16   Critical care time with this pt today was 45 minutes.   Monica Becton, MD Pulmonary and [place] Pager: [phone] After 3 pm or if no response, call [phone]  01/18/2016, 4:41 PM

## 2016-01-18 NOTE — Progress Notes (Signed)
Pharmacy Antibiotic Note  Heather Banks is a 65 y.o. female admitted on 01/18/2016 with acute on chronic respiratory failure/AECOPD.  Pharmacy has been consulted for Vancomycin and Zosyn dosing for pneumonia.   Hx [facility], prior chemotherapy and radiation.   Transferred to [name].  Received Zosyn 4.5 gm IV x 1 at 1pm and Levaquin 750 mg IV x 1 at ~10:30am.   Plan: Vancomycin 1 gm  IV every 12 hours.  Goal trough 15-20 mcg/mL. Zosyn 3.375g IV q8h (4 hour infusion).  Follow renal function, culture data, progress.    Temp (24hrs), Avg:97.7 F (36.5 C), Min:97.7 F (36.5 C), Max:97.7 F (36.5 C)   Recent Labs Lab 01/18/16 1703  WBC 9.1    Estimated Creatinine Clearance: 80.8 mL/min (by C-G formula based on Cr of 0.8).    No Known Allergies  Antimicrobials this admission:   Vanc 6/23>>  Zosyn 6/23>>  Levaquin 750 mg IV x 1 at [name] at 1023  Zosyn 4.5 gm IV x 1 at [name] at 1256  Dose adjustments this admission:  n/a  Microbiology results:   6/23 blood x 2 - sent  6/23 urine -  6/23 MRSA screen - sent  [facility] Pager: [phone] 01/18/2016 5:43 PM

## 2016-01-19 DIAGNOSIS — J9602 Acute respiratory failure with hypercapnia: Secondary | ICD-10-CM

## 2016-01-19 DIAGNOSIS — J441 Chronic obstructive pulmonary disease with (acute) exacerbation: Principal | ICD-10-CM

## 2016-01-19 DIAGNOSIS — J9601 Acute respiratory failure with hypoxia: Secondary | ICD-10-CM

## 2016-01-19 LAB — MAGNESIUM: MAGNESIUM: 1.8 mg/dL (ref 1.7–2.4)

## 2016-01-19 LAB — URINALYSIS, ROUTINE W REFLEX MICROSCOPIC
Bilirubin Urine: NEGATIVE
Glucose, UA: NEGATIVE mg/dL
KETONES UR: 15 mg/dL — AB
LEUKOCYTES UA: NEGATIVE
NITRITE: NEGATIVE
PH: 5.5 (ref 5.0–8.0)
Protein, ur: 100 mg/dL — AB
Specific Gravity, Urine: 1.04 — ABNORMAL HIGH (ref 1.005–1.030)

## 2016-01-19 LAB — CBC
HCT: 39.6 % (ref 36.0–46.0)
HEMOGLOBIN: 12.1 g/dL (ref 12.0–15.0)
MCH: 29.7 pg (ref 26.0–34.0)
MCHC: 30.6 g/dL (ref 30.0–36.0)
MCV: 97.3 fL (ref 78.0–100.0)
PLATELETS: 239 10*3/uL (ref 150–400)
RBC: 4.07 MIL/uL (ref 3.87–5.11)
RDW: 16.3 % — ABNORMAL HIGH (ref 11.5–15.5)
WBC: 6.6 10*3/uL (ref 4.0–10.5)

## 2016-01-19 LAB — GLUCOSE, CAPILLARY
GLUCOSE-CAPILLARY: 146 mg/dL — AB (ref 65–99)
GLUCOSE-CAPILLARY: 137 mg/dL — AB (ref 65–99)
Glucose-Capillary: 148 mg/dL — ABNORMAL HIGH (ref 65–99)
Glucose-Capillary: 156 mg/dL — ABNORMAL HIGH (ref 65–99)
Glucose-Capillary: 185 mg/dL — ABNORMAL HIGH (ref 65–99)
Glucose-Capillary: 198 mg/dL — ABNORMAL HIGH (ref 65–99)
Glucose-Capillary: 82 mg/dL (ref 65–99)

## 2016-01-19 LAB — URINE MICROSCOPIC-ADD ON

## 2016-01-19 LAB — BASIC METABOLIC PANEL
ANION GAP: 7 (ref 5–15)
BUN: 10 mg/dL (ref 6–20)
CO2: 29 mmol/L (ref 22–32)
Calcium: 9.2 mg/dL (ref 8.9–10.3)
Chloride: 103 mmol/L (ref 101–111)
Creatinine, Ser: 0.54 mg/dL (ref 0.44–1.00)
GFR calc Af Amer: >60 mL/min (ref >60)
GLUCOSE: 151 mg/dL — AB (ref 65–99)
POTASSIUM: 4.1 mmol/L (ref 3.5–5.1)
SODIUM: 139 mmol/L (ref 135–145)

## 2016-01-19 LAB — PROCALCITONIN

## 2016-01-19 LAB — STREP PNEUMONIAE URINARY ANTIGEN: Strep Pneumo Urinary Antigen: NEGATIVE

## 2016-01-19 LAB — TROPONIN I

## 2016-01-19 LAB — PHOSPHORUS: Phosphorus: 3.6 mg/dL (ref 2.5–4.6)

## 2016-01-19 MED ORDER — SODIUM CHLORIDE 0.9 % IV SOLN
INTRAVENOUS | Status: DC
  Administered 2016-01-19 – 2016-01-20 (×2): via INTRAVENOUS

## 2016-01-19 MED ORDER — ACETAMINOPHEN 325 MG PO TABS
650.0000 mg | ORAL_TABLET | ORAL | Status: DC | PRN
  Administered 2016-01-19 (×2): 650 mg via ORAL
  Filled 2016-01-19 (×2): qty 2

## 2016-01-19 MED ORDER — ASPIRIN-ACETAMINOPHEN-CAFFEINE 250-250-65 MG PO TABS
2.0000 | ORAL_TABLET | Freq: Three times a day (TID) | ORAL | Status: DC | PRN
  Administered 2016-01-20 – 2016-01-21 (×3): 2 via ORAL
  Filled 2016-01-19 (×5): qty 2

## 2016-01-19 MED ORDER — CETYLPYRIDINIUM CHLORIDE 0.05 % MT LIQD
7.0000 mL | Freq: Two times a day (BID) | OROMUCOSAL | Status: DC
Start: 2016-01-19 — End: 2016-01-20

## 2016-01-19 MED ORDER — ALPRAZOLAM 0.5 MG PO TABS
0.5000 mg | ORAL_TABLET | Freq: Every evening | ORAL | Status: DC | PRN
  Administered 2016-01-19: 0.5 mg via ORAL
  Filled 2016-01-19: qty 1

## 2016-01-19 NOTE — Procedures (Signed)
Pt is resting comfortably on HFNC.  BiPAP is on standby.

## 2016-01-19 NOTE — Progress Notes (Signed)
Attempted to place pt on bipap as she was complaining she was very short of breath and struggling to breathe. Pt ripped mask off after approximately 2 mins stating she was smothering. Pt spo2 dropped to 69%. Pt placed on 8L HFNC. Pt slowly improved to 90% but continued to have increased WOB. RN at bedside. RT will continue to closely monitor.

## 2016-01-19 NOTE — Progress Notes (Signed)
PULMONARY / CRITICAL CARE MEDICINE   Name: Heather Banks MRN: [id number] DOB: [birth year]    ADMISSION DATE:  01/18/2016 CONSULTATION DATE:  01/18/2016  REFERRING MD:  Dr. Glade Lloyd from [facility] [place] [facility] [place]:  SOB  HISTORY OF PRESENT ILLNESS:   Pt was transferred [facility] for further management of her acute on chronic resp Fx/AECOPD.   Pt with severe COPD (FEV1  1.21  Or 46%), with Stage IV (T2a, N0, M1a) non-small cell lung cancer, adenocarcinoma presented with bilateral pulmonary lesions, not on O2, presented to ED for 3 day h/o SOB.   She usually sees Dr. Julien Nordmann for her lung CA. It appears, patient had progression of her lung cancer for which she had palliative radiation which finished in April 2017. Prior to that, she finished 6 cycles of chemotherapy. On her last follow-up with Dr. Earlie Server in June 2017, patient was relatively well and repeat chest CT scan showed stable left upper lung lesion.  As mentioned, patient presented to the emergency room with acute on chronic dyspnea. Blood pressure was 170/95, pulse of 120, respiratory rate of 30s, 72% on room air. She was placed on BiPAP. O2 sats were 97%. Chest CT scan done which was personally reviewed showed similar to the findings in the January 01, 2016 chest CT scan done at [facility] except for the fact that there is a right lower lobe infiltrate and no evidence of filling defect.. Initial blood work showed a white count of 12. Her electrolytes were within normal. Initial ABG showed pH of 7.25, a CO2 of 62, PO2 45, unknown FiO2.  Because she goes to [facility] for lung cancer managment, it was decided to transfer her to [facility].  On arrival to ICU, she was noted to be in respiratory distress. She was very tight. She has received 2 doses of albuterol. She was also pulling off her BiPAP. She had to be restrained for compliance with therapy. We ended up giving her another 2 doses of DuoNeb. We  were able to speak with her daughter, Heather Banks. Heather Banks said patient did not want to be intubated but was okay on the BiPAP. Patient wants everything done except intubation.   SUBJECTIVE:  Patient reports feeling much better.  Denies acute complaints.  Wore BiPAP until early am, then removed.  Patient asking for something to [place]: BP 125/78 mmHg  Pulse 101  Temp(Src) 98.2 F (36.8 C) (Oral)  Resp 20  Ht '5\' 2"'$  (1.575 m)  Wt 186 lb 11.7 oz (84.7 kg)  BMI 34.14 kg/m2  SpO2 92%  HEMODYNAMICS:    VENTILATOR SETTINGS: Vent Mode:  [-] BIPAP FiO2 (%):  [30 %-50 %] 30 % Set Rate:  [12 bmp] 12 bmp PEEP:  [6 cmH20] 6 cmH20 Pressure Support:  [6 cmH20-8 cmH20] 8 cmH20  INTAKE / OUTPUT: I/O last 3 completed shifts: In: 1845.7 [I.V.:1395.7; IV Piggyback:450] Out: 1 [Urine:1]  PHYSICAL EXAMINATION: General:  Chronically ill appearing adult female in NAD Neuro:  Cranial nerves grossly intact. Moves all extremities equally.   HEENT:  PERLA, (-) NVD, (-) oral thrush seen.  Cardiovascular:  s1s2 rrr, no m/r/g Lungs: non-labored, lungs bilaterally coarse with wheezing Abdomen:  Positive bowel sounds. Soft. Obese. No masses or tenderness. Musculoskeletal:  No clubbing or cyanosis. Skin:  She has chronic erythematous maculopapular rash in both her lower extremities since getting immunotherapy for cancer. They seem to be stable. No evidence of infection.  LABS:  BMET  Recent Labs Lab 01/18/16 1703 01/19/16 0400  NA 141 139  K 4.4 4.1  CL 106 103  CO2 27 29  BUN 9 10  CREATININE 0.58 0.54  GLUCOSE 162* 151*    Electrolytes  Recent Labs Lab 01/18/16 1703 01/19/16 0400  CALCIUM 9.0 9.2  MG 2.0 1.8  PHOS 4.2 3.6    CBC  Recent Labs Lab 01/18/16 1703 01/19/16 0400  WBC 9.1 6.6  HGB 13.3 12.1  HCT 42.7 39.6  PLT 274 239    Coag's  Recent Labs Lab 01/18/16 1703  APTT 33  INR 1.08    Sepsis Markers  Recent Labs Lab 01/18/16 1706  01/18/16 1715 01/19/16 0400  LATICACIDVEN 1.0  --   --   PROCALCITON  --  <0.10 <0.10    ABG  Recent Labs Lab 01/18/16 1614 01/19/16 0422  PHART 7.194* 7.339*  PCO2ART 78.9* 51.8*  PO2ART 131.0* 105*    Liver Enzymes  Recent Labs Lab 01/18/16 1703  AST 25  ALT 23  ALKPHOS 140*  BILITOT 0.3  ALBUMIN 3.8    Cardiac Enzymes  Recent Labs Lab 01/18/16 1703 01/18/16 2212 01/19/16 0400  TROPONINI <0.03 <0.03 <0.03    Glucose  Recent Labs Lab 01/18/16 1620 01/18/16 1811 01/18/16 1932 01/18/16 2347 01/19/16 0340 01/19/16 0840  GLUCAP 185* 247* 220* 82 148* 156*    Imaging Dg Chest Port 1 View  01/18/2016  CLINICAL DATA:  Pneumonia EXAM: PORTABLE CHEST 1 VIEW COMPARISON:  CT scan 01/01/2016 FINDINGS: Cardiomediastinal silhouette is stable. Study is suboptimal as patient's chin is overlying bilateral lung apices. No pulmonary edema. No segmental infiltrate. Chronic consolidation in left upper lobe medially only partially visualized obscured by patient's chin. IMPRESSION: Study is suboptimal as patient's chin is overlying bilateral lung apices. No pulmonary edema. No segmental infiltrate. Chronic consolidation in left upper lobe medially only partially visualized obscured by patient's chin. Electronically Signed   By: Lahoma Crocker M.D.   On: 01/18/2016 17:23     STUDIES:    CULTURES: MRSA 6/23 > Blood 6/23 > Urine 6/23 >   ANTIBIOTICS: Zosyn 6/23 > Vanc 6/23 >   SIGNIFICANT EVENTS: 6/23  pt admitted for AECOPD/HCAP  LINES/TUBES: R Anterior Port-a-Cath >>   DISCUSSION: 47 female, known to have severe COPD, known to have stage IV lung cancer ( for which she finished palliative radiation in April), continues to smoke cigarettes, admitted for severe acute exacerbation of COPD and right lower lobe pneumonia. Required BiPAP on admit. She is a DO NOT INTUBATE.  ASSESSMENT / PLAN:  PULMONARY A: Acute on chronic hypoxemic hypercapnic respiratory failure  secondary to severe acute COPD exacerbation Consider HCAP Patient with severe COPD, FEV1  1.21 or 46% Stage IV lung cancer. She has both masses in the left upper lung and right upper lung. Status post palliative radiation which finished in April 2017. Tobacco Abuse P:   Patient is a DO NOT INTUBATE. BiPAP PRN & QHS this evening for support during acute phase Continue Medrol 40 mg IV every 6 hours. Duoneb Q4, Pulmicort twice a day. Antibiotics as below. Will need outpatient follow up with Dr. Elsworth Soho at discharge  CARDIOVASCULAR A:  Sinus tachycardia secondary to above. EKG with recent lateral infarct. Hypertension. P:  Trend troponin. Consider 2D ECHO if positive troponin or clinical change Monitor in ICU.  RENAL A:   No issues.  P:   Trend BMP / UOP Repeat electrolytes. IVF D5 1/2 NS 100  mls/hr   GASTROINTESTINAL A:   GERD P:   Begin regular diet as tolerated  Keep on IVF.    HEMATOLOGIC A:   Stage IV (T2a, N0, M1a) non-small cell lung cancer, adenocarcinoma - Followed by Dr. Earlie Server, last seen 12/2015.  Per his note, patient had palliative radiation of the left upper lung mass since it was seen to be progressing. Palliative radiation finished in April 2017. Patient is status post 6 cycles of chemotherapy before that. Chest CT scan in June 2017 showed stable left upper lung mass. P:  Monitor CBC Will need follow up with Dr. Earlie Server as outpatient  INFECTIOUS A:   Unclear whether there is acute infection Consider HCAP  P:   Zosyn and vanc, narrow pending culture review Trend lactate, PCT  ENDOCRINE A:   Hyperglycemia P:   CBG q 4 while NPO. SSI  NEUROLOGIC A:   Anxiety  P:   RASS goal: 0 Minimize sedating medications.   FAMILY  - Updates: Family and patient updated 6/24 at bedside.    - Inter-disciplinary family meet or Palliative Care meeting due by:  01/25/16.  DNI, otherwise full care.    If remains stable, consider transfer out to medical  floor in am  Noe Gens, NP-C [place] Pgr: [phone] or if no answer [phone] 01/19/2016, 12:21 PM   Attending Note:  I have examined patient, reviewed labs, studies and notes. I have discussed the case with B Ollis, and I agree with the data and plans as amended above. Hx severe COPD and stage IV adenoCA on therapy. She was admitted 6/23 with acute resp failure and AE-COPD requiring BiPAP. On eval today she is much improved. She has distant BS but improved air movement. Some scattered wheezes. Now off BiPAP. Her CXR is not convincing for an acute infiltrate. We will plan to continue her steroids, BD's. Use BiPAP prn but I doubt she will require further. Continue empiric abx for now but will likely be able to narrow soon.  Independent critical care time is 32 minutes.   Baltazar Apo, MD, PhD 01/19/2016, 1:03 PM [place] [facility] [phone] or if no answer [phone]

## 2016-01-19 NOTE — Progress Notes (Signed)
[place] Progress Note Patient Name: Heather Banks DOB: September 16, 1950 MRN: [id number]   Date of Service  01/19/2016  HPI/Events of Note  Blood glucose = 190. Patient is eating a diet and has IV fluid with D5 0.45 NaCl.  eICU Interventions  Will change IV fluid to D5 0.9 NaCl at 75 mL/hour.      Intervention Category Intermediate Interventions: Hyperglycemia - evaluation and treatment  Sommer,Steven Cornelia Copa 01/19/2016, 4:58 PM

## 2016-01-20 LAB — GLUCOSE, CAPILLARY
GLUCOSE-CAPILLARY: 122 mg/dL — AB (ref 65–99)
GLUCOSE-CAPILLARY: 132 mg/dL — AB (ref 65–99)
Glucose-Capillary: 137 mg/dL — ABNORMAL HIGH (ref 65–99)
Glucose-Capillary: 107 mg/dL — ABNORMAL HIGH (ref 65–99)
Glucose-Capillary: 124 mg/dL — ABNORMAL HIGH (ref 65–99)
Glucose-Capillary: 119 mg/dL — ABNORMAL HIGH (ref 65–99)

## 2016-01-20 LAB — CBC
HEMATOCRIT: 39.6 % (ref 36.0–46.0)
Hemoglobin: 12 g/dL (ref 12.0–15.0)
MCH: 29.8 pg (ref 26.0–34.0)
MCHC: 30.3 g/dL (ref 30.0–36.0)
MCV: 98.3 fL (ref 78.0–100.0)
Platelets: 281 10*3/uL (ref 150–400)
RBC: 4.03 MIL/uL (ref 3.87–5.11)
RDW: 16.2 % — AB (ref 11.5–15.5)
WBC: 14 10*3/uL — ABNORMAL HIGH (ref 4.0–10.5)

## 2016-01-20 LAB — BASIC METABOLIC PANEL
Anion gap: 6 (ref 5–15)
BUN: 9 mg/dL (ref 6–20)
CALCIUM: 8.9 mg/dL (ref 8.9–10.3)
CO2: 32 mmol/L (ref 22–32)
CREATININE: 0.58 mg/dL (ref 0.44–1.00)
Chloride: 103 mmol/L (ref 101–111)
GFR calc Af Amer: >60 mL/min (ref >60)
GFR calc non Af Amer: >60 mL/min (ref >60)
Glucose, Bld: 148 mg/dL — ABNORMAL HIGH (ref 65–99)
Potassium: 4 mmol/L (ref 3.5–5.1)
Sodium: 141 mmol/L (ref 135–145)

## 2016-01-20 LAB — PROCALCITONIN: Procalcitonin: <0.1 ng/mL

## 2016-01-20 LAB — MAGNESIUM: Magnesium: 2 mg/dL (ref 1.7–2.4)

## 2016-01-20 LAB — URINE CULTURE: Culture: NO GROWTH

## 2016-01-20 LAB — PHOSPHORUS: Phosphorus: 2.9 mg/dL (ref 2.5–4.6)

## 2016-01-20 MED ORDER — METHYLPREDNISOLONE SODIUM SUCC 40 MG IJ SOLR
40.0000 mg | Freq: Three times a day (TID) | INTRAMUSCULAR | Status: DC
  Administered 2016-01-20 – 2016-01-21 (×3): 40 mg via INTRAVENOUS
  Filled 2016-01-20 (×3): qty 1

## 2016-01-20 MED ORDER — MIRTAZAPINE 30 MG PO TABS
30.0000 mg | ORAL_TABLET | Freq: Every evening | ORAL | Status: DC | PRN
  Administered 2016-01-20: 30 mg via ORAL
  Filled 2016-01-20 (×2): qty 1

## 2016-01-20 MED ORDER — SODIUM CHLORIDE 0.9 % IV SOLN: INTRAVENOUS | Status: DC

## 2016-01-20 MED ORDER — INSULIN ASPART 100 UNIT/ML ~~LOC~~ SOLN
0.0000 [IU] | Freq: Three times a day (TID) | SUBCUTANEOUS | Status: DC
Start: 2016-01-20 — End: 2016-01-22
  Administered 2016-01-20 – 2016-01-22 (×5): 3 [IU] via SUBCUTANEOUS

## 2016-01-20 MED ORDER — ALPRAZOLAM 0.5 MG PO TABS
0.5000 mg | ORAL_TABLET | Freq: Every evening | ORAL | Status: DC | PRN
  Administered 2016-01-21: 0.5 mg via ORAL
  Filled 2016-01-20: qty 1

## 2016-01-20 MED ORDER — GUAIFENESIN ER 600 MG PO TB12
600.0000 mg | ORAL_TABLET | Freq: Two times a day (BID) | ORAL | Status: DC
  Administered 2016-01-20 – 2016-01-22 (×4): 600 mg via ORAL
  Filled 2016-01-20 (×4): qty 1

## 2016-01-20 MED ORDER — LEVOFLOXACIN 750 MG PO TABS
750.0000 mg | ORAL_TABLET | Freq: Every day | ORAL | Status: DC
  Administered 2016-01-20 – 2016-01-22 (×3): 750 mg via ORAL
  Filled 2016-01-20 (×3): qty 1

## 2016-01-20 NOTE — Progress Notes (Signed)
BIPAP not needed at this time. Patient on 9L HFNC, weaning as tolerated. RT will continue to monitor

## 2016-01-20 NOTE — Progress Notes (Signed)
PULMONARY / CRITICAL CARE MEDICINE   Name: Heather Banks MRN: [id number] DOB: 1951/03/15    ADMISSION DATE:  01/18/2016 CONSULTATION DATE:  01/18/2016  REFERRING MD:  Dr. Glade Lloyd from [facility] of [place] and [place]:  SOB  HISTORY OF PRESENT ILLNESS:   Pt was transferred to [facility] ICU for further management of her acute on chronic resp Fx/AECOPD.   Pt with severe COPD (FEV1  1.21  Or 46%), with Stage IV (T2a, N0, M1a) non-small cell lung cancer, adenocarcinoma presented with bilateral pulmonary lesions, not on O2, presented to ED for 3 day h/o SOB.   She usually sees Dr. Julien Nordmann for her lung CA. It appears, patient had progression of her lung cancer for which she had palliative radiation which finished in April 2017. Prior to that, she finished 6 cycles of chemotherapy. On her last follow-up with Dr. Earlie Server in June 2017, patient was relatively well and repeat chest CT scan showed stable left upper lung lesion.  As mentioned, patient presented to the emergency room with acute on chronic dyspnea. Blood pressure was 170/95, pulse of 120, respiratory rate of 30s, 72% on room air. She was placed on BiPAP. O2 sats were 97%. Chest CT scan done which was personally reviewed showed similar to the findings in the January 01, 2016 chest CT scan done at [facility] except for the fact that there is a right lower lobe infiltrate and no evidence of filling defect.. Initial blood work showed a white count of 12. Her electrolytes were within normal. Initial ABG showed pH of 7.25, a CO2 of 62, PO2 45, unknown FiO2.  Because she goes to [facility] for lung cancer managment, it was decided to transfer her to [facility].  On arrival to ICU, she was noted to be in respiratory distress. She was very tight. She has received 2 doses of albuterol. She was also pulling off her BiPAP. She had to be restrained for compliance with therapy. We ended up giving her another 2 doses of DuoNeb. We  were able to speak with her daughter, [name]. [name] said patient did not want to be intubated but was okay on the BiPAP. Patient wants everything done except intubation.   SUBJECTIVE:  Patient reports feeling much better.   Did not wear bipap last night- reports she feels claustrophobic.  No acute events.   VITAL SIGNS: BP 128/90 mmHg  Pulse 89  Temp(Src) 98.5 F (36.9 C) (Oral)  Resp 19  Ht '5\' 2"'$  (1.575 m)  Wt 186 lb 11.7 oz (84.7 kg)  BMI 34.14 kg/m2  SpO2 96%  HEMODYNAMICS:    VENTILATOR SETTINGS:    INTAKE / OUTPUT: I/O last 3 completed shifts: In: 4265.2 [I.V.:3465.2; IV HLKTGYBWL:893] Out: 7342 [Urine:1356]  PHYSICAL EXAMINATION: General:  Chronically ill appearing adult female in NAD Neuro:  Cranial nerves grossly intact. Moves all extremities equally, speech clear HEENT:  PERLA, (-) NVD, (-) oral thrush seen.  Cardiovascular:  s1s2 rrr, no m/r/g Lungs: non-labored, lungs bilaterally coarse with wheezing  Abdomen:  Positive bowel sounds. Soft. Obese. No masses or tenderness. Musculoskeletal:  No clubbing or cyanosis. Skin:  She has chronic erythematous maculopapular rash in both her lower extremities since getting immunotherapy for cancer. They seem to be stable. No evidence of infection.   LABS:  BMET  Recent Labs Lab 01/18/16 1703 01/19/16 0400  NA 141 139  K 4.4 4.1  CL 106 103  CO2 27 29  BUN 9 10  CREATININE 0.58 0.54  GLUCOSE 162* 151*    Electrolytes  Recent Labs Lab 01/18/16 1703 01/19/16 0400  CALCIUM 9.0 9.2  MG 2.0 1.8  PHOS 4.2 3.6    CBC  Recent Labs Lab 01/18/16 1703 01/19/16 0400 01/20/16 1023  WBC 9.1 6.6 14.0*  HGB 13.3 12.1 12.0  HCT 42.7 39.6 39.6  PLT 274 239 281    Coag's  Recent Labs Lab 01/18/16 1703  APTT 33  INR 1.08    Sepsis Markers  Recent Labs Lab 01/18/16 1706 01/18/16 1715 01/19/16 0400  LATICACIDVEN 1.0  --   --   PROCALCITON  --  <0.10 <0.10    ABG  Recent Labs Lab  01/18/16 1614 01/19/16 0422  PHART 7.194* 7.339*  PCO2ART 78.9* 51.8*  PO2ART 131.0* 105*    Liver Enzymes  Recent Labs Lab 01/18/16 1703  AST 25  ALT 23  ALKPHOS 140*  BILITOT 0.3  ALBUMIN 3.8    Cardiac Enzymes  Recent Labs Lab 01/18/16 1703 01/18/16 2212 01/19/16 0400  TROPONINI <0.03 <0.03 <0.03    Glucose  Recent Labs Lab 01/19/16 1223 01/19/16 1630 01/19/16 1927 01/20/16 0026 01/20/16 0423 01/20/16 0810  GLUCAP 146* 198* 137* 132* 122* 137*    Imaging No results found.   STUDIES:    CULTURES: MRSA 6/23 > neg Blood 6/23 > Urine 6/23 > neg  ANTIBIOTICS: Zosyn 6/23 > 6/25 Vanc 6/23 > 6/25 Levaquin 6/25 >>   SIGNIFICANT EVENTS: 6/23  pt admitted for AECOPD/ possible HCAP  LINES/TUBES: R Anterior Port-a-Cath >>   DISCUSSION: 65 female, known to have severe COPD, known to have stage IV lung cancer ( for which she finished palliative radiation in April), continues to smoke cigarettes, admitted for severe acute exacerbation of COPD and concern for right lower lobe pneumonia. Required BiPAP on admit. She is a DO NOT INTUBATE.  ASSESSMENT / PLAN:  PULMONARY A: Acute on chronic hypoxemic hypercapnic respiratory failure secondary to severe acute COPD exacerbation Consider HCAP Patient with severe COPD, FEV1  1.21 or 46% Stage IV lung cancer. She has both masses in the left upper lung and right upper lung. Status post palliative radiation which finished in April 2017. Tobacco Abuse P:   Patient is a DO NOT INTUBATE. Reduce Medrol 40 mg IV every 8  hours Duoneb Q4, Pulmicort twice a day. Antibiotics as below. Will need outpatient follow up with Dr. Elsworth Soho at discharge  CARDIOVASCULAR A:  Sinus tachycardia secondary to above. EKG with recent lateral infarct. Hypertension. P:  Trend troponin >> negative  Transfer to medical floor   RENAL A:   No issues.  P:   Trend BMP / UOP Repeat electrolytes. Reduce NS to  [facility]   GASTROINTESTINAL A:   GERD P:   Regular diet as tolerated  Keep on IVF.    HEMATOLOGIC A:   Stage IV (T2a, N0, M1a) non-small cell lung cancer, adenocarcinoma - Followed by Dr. Earlie Server, last seen 12/2015.  Per his note, patient had palliative radiation of the left upper lung mass since it was seen to be progressing. Palliative radiation finished in April 2017. Patient is status post 6 cycles of chemotherapy before that. Chest CT scan in June 2017 showed stable left upper lung mass. P:  Monitor CBC Will need follow up with Dr. Earlie Server as outpatient  INFECTIOUS A:   Unclear whether there is acute infection.  Suspect AECOPD with poor reserve Consider HCAP  P:   Narrow abx to levaquin If CXR  in am negative for infiltrate, consider 5 days total abx Trend lactate, PCT (neg)  ENDOCRINE A:   Hyperglycemia P:   CBG q 4 while NPO. SSI  NEUROLOGIC A:   Anxiety  P:   RASS goal: 0 Minimize sedating medications.   FAMILY  - Updates: Patient updated 6/25 at bedside.    - Inter-disciplinary family meet or Palliative Care meeting due by:  01/25/16.  DNI, otherwise full care.    Transfer to medical floor.    Noe Gens, NP-C [facility] Pgr: [phone] or if no answer [phone] 01/20/2016, 10:49 AM    Attending Note:  I have examined patient, reviewed labs, studies and notes. I have discussed the case with B Ollis, and I agree with the data and plans as amended above. 65 yo woman with severe COPD and stage IV adenoCA, on therapy. She was admitted 6/23 with acute resp failure and AE-COPD requiring BiPAP. On eval today she is much improved. She has distant BS but improved air movement. Some scattered wheezes. She did not require BiPAP last pm.  We will plan to wean her steroids, continue BD's. Narrow abx to levaquin alone. Transfer out of ICU. Probably home 6/26 or 6/27. Will f/u with Drs Elsworth Soho and Julien Nordmann.    Baltazar Apo, MD, PhD 01/20/2016, 1:53  PM [facility] [phone] or if no answer [phone]

## 2016-01-20 NOTE — Progress Notes (Signed)
[facility] Progress Note Patient Name: Heather Banks DOB: [birth year] MRN: [id number]   Date of Service  01/20/2016  HPI/Events of Note  Cough - Family told that Mucinex would be ordered and it has not been,  eICU Interventions  Will order Mucinex 600 mg PO Q 12 hours.      Intervention Category Intermediate Interventions: Other:  Lysle Dingwall 01/20/2016, 4:53 PM

## 2016-01-21 ENCOUNTER — Inpatient Hospital Stay (HOSPITAL_COMMUNITY): Payer: Medicaid - Out of State

## 2016-01-21 ENCOUNTER — Encounter (HOSPITAL_COMMUNITY): Payer: Self-pay | Admitting: General Practice

## 2016-01-21 ENCOUNTER — Telehealth: Payer: Self-pay | Admitting: Pulmonary Disease

## 2016-01-21 LAB — BLOOD GAS, ARTERIAL
Acid-Base Excess: 1.9 mmol/L (ref 0.0–2.0)
BICARBONATE: 27.2 meq/L — AB (ref 20.0–24.0)
Delivery systems: POSITIVE
Drawn by: 40415
EXPIRATORY PAP: 6
FIO2: 0.4
INSPIRATORY PAP: 14
LHR: 12 {breaths}/min
Mode: POSITIVE
O2 SAT: 97.8 %
PATIENT TEMPERATURE: 98.4
PCO2 ART: 51.8 mmHg — AB (ref 35.0–45.0)
PH ART: 7.339 — AB (ref 7.350–7.450)
PO2 ART: 105 mmHg — AB (ref 80.0–100.0)
TCO2: 28.8 mmol/L (ref 0–100)

## 2016-01-21 LAB — CBC
HCT: 39.5 % (ref 36.0–46.0)
Hemoglobin: 11.9 g/dL — ABNORMAL LOW (ref 12.0–15.0)
MCH: 29.5 pg (ref 26.0–34.0)
MCHC: 30.1 g/dL (ref 30.0–36.0)
MCV: 97.8 fL (ref 78.0–100.0)
PLATELETS: 270 10*3/uL (ref 150–400)
RBC: 4.04 MIL/uL (ref 3.87–5.11)
RDW: 15.9 % — AB (ref 11.5–15.5)
WBC: 8.9 10*3/uL (ref 4.0–10.5)

## 2016-01-21 LAB — PHOSPHORUS: Phosphorus: 2.6 mg/dL (ref 2.5–4.6)

## 2016-01-21 LAB — GLUCOSE, CAPILLARY
GLUCOSE-CAPILLARY: 115 mg/dL — AB (ref 65–99)
Glucose-Capillary: 129 mg/dL — ABNORMAL HIGH (ref 65–99)
Glucose-Capillary: 130 mg/dL — ABNORMAL HIGH (ref 65–99)
Glucose-Capillary: 131 mg/dL — ABNORMAL HIGH (ref 65–99)
Glucose-Capillary: 145 mg/dL — ABNORMAL HIGH (ref 65–99)
Glucose-Capillary: 154 mg/dL — ABNORMAL HIGH (ref 65–99)

## 2016-01-21 LAB — BASIC METABOLIC PANEL
ANION GAP: 6 (ref 5–15)
BUN: 8 mg/dL (ref 6–20)
CALCIUM: 9.1 mg/dL (ref 8.9–10.3)
CO2: 34 mmol/L — ABNORMAL HIGH (ref 22–32)
Chloride: 100 mmol/L — ABNORMAL LOW (ref 101–111)
Creatinine, Ser: 0.51 mg/dL (ref 0.44–1.00)
GFR calc Af Amer: >60 mL/min (ref >60)
GLUCOSE: 140 mg/dL — AB (ref 65–99)
Potassium: 3.7 mmol/L (ref 3.5–5.1)
SODIUM: 140 mmol/L (ref 135–145)

## 2016-01-21 LAB — MAGNESIUM: MAGNESIUM: 2.2 mg/dL (ref 1.7–2.4)

## 2016-01-21 LAB — LEGIONELLA PNEUMOPHILA SEROGP 1 UR AG: L. PNEUMOPHILA SEROGP 1 UR AG: NEGATIVE

## 2016-01-21 MED ORDER — SODIUM CHLORIDE 0.9% FLUSH
10.0000 mL | INTRAVENOUS | Status: DC | PRN
Start: 2016-01-21 — End: 2016-01-22

## 2016-01-21 MED ORDER — IOPAMIDOL (ISOVUE-370) INJECTION 76%
INTRAVENOUS | Status: AC
  Administered 2016-01-21: 100 mL
  Filled 2016-01-21: qty 100

## 2016-01-21 MED ORDER — PREDNISONE 20 MG PO TABS
40.0000 mg | ORAL_TABLET | Freq: Every day | ORAL | Status: DC
  Administered 2016-01-22: 40 mg via ORAL
  Filled 2016-01-21: qty 2

## 2016-01-21 NOTE — Care Management Note (Addendum)
Case Management Note  Patient Details  Name: Heather Banks MRN: [id number] Date of Birth: 1951/04/13  Subjective/Objective:                    Action/Plan:  Spoke to patient and daughter [name] [phone] at bedside . At discharge patient will be discharging to her mother's home [place] phone [phone] address [address] , [place] .   Explained [place] letter .   NO PCP scheduled follow up appointment at [facility] for February 26, 2016 at 0900 am phone [place] , [place], [place] [address]  Will continue to follow. Expected Discharge Date:      01-22-16            Expected Discharge Plan:   Home health RN   In-House Referral:     Discharge planning Services  CM Consult, [facility], [facility]  Post Acute Care Choice:    Choice offered to:  Patient, Adult Children  DME Arranged:  Oxygen DME Agency:  [place]:    [facility] Agency:     Status of Service:  In process, will continue to follow  If discussed at Long Length of Stay Meetings, dates discussed:    Additional Comments:  Marilu Favre, RN 01/21/2016, 11:10 AM

## 2016-01-21 NOTE — Progress Notes (Signed)
PULMONARY / CRITICAL CARE MEDICINE   Name: Heather Banks MRN: [id number] DOB: Mar 14, 1951    ADMISSION DATE:  01/18/2016 CONSULTATION DATE:  01/18/2016  REFERRING MD:  Dr. Glade Lloyd from [facility] of [place] and [place]:  SOB  HISTORY OF PRESENT ILLNESS:   Pt was transferred to [facility] ICU for further management of her acute on chronic resp Fx/AECOPD.    65yo female with severe COPD (FEV1  1.21  Or 46%) NOT on home O2, with Stage IV (T2a, N0, M1a) non-small cell lung cancer, adenocarcinoma with bilateral pulmonary lesions s/p palliative radiation (finished April 2017) and chemo.  She presented to ED 6/23 for 3 day h/o SOB. She initially required bipap and ICU admit.  Treated for AECOPD +/- HCAP.       SUBJECTIVE:  Feels better overall but still much more SOB than baseline.  Remains on 6L [place] (does NOT wear home O2).  No further bipap needs.   VITAL SIGNS: BP 121/64 mmHg  Pulse 89  Temp(Src) 98.1 F (36.7 C) (Oral)  Resp 18  Ht '5\' 2"'$  (1.575 m)  Wt 89.994 kg (198 lb 6.4 oz)  BMI 36.28 kg/m2  SpO2 97%   INTAKE / OUTPUT: I/O last 3 completed shifts: In: 2762.5 [P.O.:600; I.V.:1850; IV Piggyback:312.5] Out: 1950 [Urine:1950]  PHYSICAL EXAMINATION: General:  Chronically ill appearing adult female in NAD Neuro:  Cranial nerves grossly intact. Moves all extremities equally, speech clear HEENT:  PERLA, (-) NVD, (-) oral thrush seen.  Cardiovascular:  s1s2 rrr, no m/r/g Lungs: non-labored, diminished bases, few exp wheezes   Abdomen:  Positive bowel sounds. Soft. Obese. No masses or tenderness. Musculoskeletal:  No clubbing or cyanosis. Skin:  She has chronic erythematous maculopapular rash in both her lower extremities since getting immunotherapy for cancer. They seem to be stable. No evidence of infection.   LABS:  BMET  Recent Labs Lab 01/19/16 0400 01/20/16 1023 01/21/16 0521  NA 139 141 140  K 4.1 4.0 3.7  CL 103 103 100*  CO2 29 32  34*  BUN '10 9 8  '$ CREATININE 0.54 0.58 0.51  GLUCOSE 151* 148* 140*    Electrolytes  Recent Labs Lab 01/19/16 0400 01/20/16 1023 01/21/16 0521  CALCIUM 9.2 8.9 9.1  MG 1.8 2.0 2.2  PHOS 3.6 2.9 2.6    CBC  Recent Labs Lab 01/19/16 0400 01/20/16 1023 01/21/16 0521  WBC 6.6 14.0* 8.9  HGB 12.1 12.0 11.9*  HCT 39.6 39.6 39.5  PLT 239 281 270    Coag's  Recent Labs Lab 01/18/16 1703  APTT 33  INR 1.08    Sepsis Markers  Recent Labs Lab 01/18/16 1706 01/18/16 1715 01/19/16 0400 01/20/16 1023  LATICACIDVEN 1.0  --   --   --   PROCALCITON  --  <0.10 <0.10 <0.10    ABG  Recent Labs Lab 01/18/16 1614 01/19/16 0422  PHART 7.194* 7.339*  PCO2ART 78.9* 51.8*  PO2ART 131.0* 105*    Liver Enzymes  Recent Labs Lab 01/18/16 1703  AST 25  ALT 23  ALKPHOS 140*  BILITOT 0.3  ALBUMIN 3.8    Cardiac Enzymes  Recent Labs Lab 01/18/16 1703 01/18/16 2212 01/19/16 0400  TROPONINI <0.03 <0.03 <0.03    Glucose  Recent Labs Lab 01/20/16 1221 01/20/16 1549 01/20/16 2016 01/21/16 0002 01/21/16 0423 01/21/16 0802  GLUCAP 107* 124* 119* 129* 154* 131*    Imaging Dg Chest 2 View  01/21/2016  CLINICAL DATA:  Chronic obstructive  pulmonary disease EXAM: CHEST  2 VIEW COMPARISON:  January 18, 2016 FINDINGS: There is no edema or consolidation. Heart size and pulmonary vascularity are normal. Port-A-Cath tip is in the superior vena cava. No pneumothorax. There are foci of atherosclerotic calcification in the aortic arch as well as in both carotid arteries. No apparent bone lesions. IMPRESSION: No edema or consolidation. No pneumothorax. Aortic atherosclerotic change noted. There are foci of carotid artery calcification bilaterally as well. Electronically Signed   By: Lowella Grip III M.D.   On: 01/21/2016 07:43     STUDIES:    CULTURES: MRSA 6/23 > neg Blood 6/23 > Urine 6/23 > neg  ANTIBIOTICS: Zosyn 6/23 > 6/25 Vanc 6/23 > 6/25 Levaquin  6/25 >>   SIGNIFICANT EVENTS: 6/23  pt admitted for AECOPD/ possible HCAP  LINES/TUBES: R Anterior Port-a-Cath >>   DISCUSSION: 12 female, known to have severe COPD, known to have stage IV lung cancer ( for which she finished palliative radiation in April), continues to smoke cigarettes, admitted for severe acute exacerbation of COPD and concern for right lower lobe pneumonia. Required BiPAP on admit. She is a DO NOT INTUBATE.  ASSESSMENT / PLAN:  Acute on chronic hypoxemic hypercapnic respiratory failure secondary to severe acute COPD exacerbation ?HCAP Patient with severe COPD, FEV1  1.21 or 46% Stage IV lung cancer. She has both masses in the left upper lung and right upper lung. Status post palliative radiation which finished in April 2017. Tobacco Abuse P:   Patient is a DO NOT INTUBATE. Change steroids to PO with slow taper 6/26 Duoneb Q4, Pulmicort twice a day. Abx - CXR remains free of focal infiltrate, will treat for total 5 days abx  Will need outpatient follow up with Dr. Elsworth Soho at discharge -- arranged  [place] prior to d/c  Suspect needs Home O2 -- had previously but says she stopped wearing it    Sinus tachycardia secondary to above - resolved.  EKG with recent lateral infarct. Hypertension. P:  Outpt PCP f/u    Stage IV (T2a, N0, M1a) non-small cell lung cancer, adenocarcinoma - Followed by Dr. Earlie Server, last seen 12/2015.  Per his note, patient had palliative radiation of the left upper lung mass since it was seen to be progressing. Palliative radiation finished in April 2017. Patient is status post 6 cycles of chemotherapy before that. Chest CT scan in June 2017 showed stable left upper lung mass. P:  Monitor CBC Will need follow up with Dr. Earlie Server as outpatient   Anxiety:  Cont home PRN xanax       FAMILY  - Updates: Patient and daughter updated 6/26 at bedside.    Likely d/c in am 6/27.    Nickolas Madrid, NP 01/21/2016  10:14  AM Pager: [phone] or [phone]

## 2016-01-21 NOTE — Discharge Summary (Signed)
Physician Discharge Summary  Patient ID: Heather Banks MRN: [id number] DOB/AGE: 1950/12/09 65 y.o.  Admit date: 01/18/2016 Discharge date: 01/22/2016    Discharge Diagnoses:   cute on chronic hypoxemic hypercapnic respiratory failure secondary to severe acute COPD exacerbation R/o HCAP Patient with severe COPD, FEV1 1.21 or 46% Stage IV lung cancer. She has both masses in the left upper lung and right upper lung. Status post palliative radiation which finished in April 2017. Tobacco Abuse Sinus tachycardia secondary to above - resolved.  EKG with recent lateral infarct. Hypertension. Stage IV (T2a, N0, M1a) non-small cell lung cancer, adenocarcinoma                                                        D/c plan by Discharge Diagnosis  Acute on chronic hypoxemic hypercapnic respiratory failure secondary to severe acute COPD exacerbation ?HCAP Patient with severe COPD, FEV1 1.21 or 46% Stage IV lung cancer. She has both masses in the left upper lung and right upper lung. Status post palliative radiation which finished in April 2017. Tobacco Abuse Plan:  Patient is a DO NOT INTUBATE. Continue PO prednisone with slow taper  Resume home BDs breo ellipta  Levaquin for total 5 days abx  outpt pulm f/u   O2 room air   Pulmonary Emboli Plan  F/u ven duplex BLE Start xarelto 58m bid x 21 ds, then 20 mg daily - total 3 months  Sinus tachycardia secondary to above - resolved.  EKG with recent lateral infarct. Hypertension. Plan:  Outpt PCP f/u    Stage IV (T2a, N0, M1a) non-small cell lung cancer, adenocarcinoma - Followed by Dr. MEarlie Server last seen 12/2015. Per his note, patient had palliative radiation of the left upper lung mass since it was seen to be progressing. Palliative radiation finished in April 2017. Patient is status post 6 cycles of chemotherapy before that. Chest CT scan in June 2017 showed stable left upper lung mass. P:  Monitor CBC Will need follow up  with Dr. MEarlie Serveras outpatient   Anxiety:  Cont home PRN xanax    Brief Summary: KLetica Giaimois a 89yofemale with severe COPD (FEV1 1.21 Or 46%) NOT on home O2, with Stage IV (T2a, N0, M1a) non-small cell lung cancer, adenocarcinoma with bilateral pulmonary lesions s/p palliative radiation (finished April 2017) and chemo. She presented to ED 6/23 for 3 day h/o SOB. She initially required bipap and ICU admit. Treated for AECOPD +/- HCAP with IV abx, IV steroids, BD's.  She weaned off bipap, respiratory status improved.  She was tx to Medical floor, steroids transitioned to PO and O2 weaned to room air.  Her respiratory status is back to baseline and she is medically cleared for d/c home pending close outpt f/u.    CULTURES: MRSA 6/23 > neg Blood 6/23 > neg  Urine 6/23 > neg  ANTIBIOTICS: Zosyn 6/23 > 6/25 Vanc 6/23 > 6/25 Levaquin 6/25 >>   SIGNIFICANT EVENTS: 6/23 pt admitted for AECOPD/ possible HCAP  LINES/TUBES: R Anterior Port-a-Cath >>     Filed Vitals:   01/22/16 0437 01/22/16 0818 01/22/16 0822 01/22/16 1106  BP:      Pulse:      Temp:      TempSrc:      Resp:      Height:  Weight:      SpO2: 93% 97% 98% 98%   O/E - clear lungs, no ac muscles, s1s2 nml, no edema  Discharge Labs  BMET  Recent Labs Lab 01/18/16 1703 01/19/16 0400 01/20/16 1023 01/21/16 0521 01/22/16 0510  NA 141 139 141 140 141  K 4.4 4.1 4.0 3.7 3.0*  CL 106 103 103 100* 101  CO2 27 29 32 34* 35*  GLUCOSE 162* 151* 148* 140* 91  BUN _0 CREATININE 0.58 0.54 0.58 0.51 0.60  CALCIUM 9.0 9.2 8.9 9.1 8.7*  MG 2.0 1.8 2.0 2.2 2.0  PHOS 4.2 3.6 2.9 2.6 3.1     CBC   Recent Labs Lab 01/20/16 1023 01/21/16 0521 01/22/16 0510  HGB 12.0 11.9* 11.9*  HCT 39.6 39.5 39.0  WBC 14.0* 8.9 6.8  PLT 281 270 238   Anti-Coagulation  Recent Labs Lab 01/18/16 1703  INR 1.08     Discharge Instructions    Diet - low sodium heart healthy    Complete by:  As  directed      Discharge instructions    Complete by:  As directed   You have 2 prescriptions for xarelto.  Take the 15 mg twice a day for 21 days with food, after this you will change to 20 mg daily with food. The second prescription reflects the 20 mg dosing and should be filled after you complete your starter pack.     Increase activity slowly    Complete by:  As directed               Follow-up Information    Follow up with Rexene Edison, NP On 02/06/2016.   Specialty:  Pulmonary Disease   Why:  10:00am    Contact information:   [address] [address] [phone]       Go to [place].   Specialty:  Internal Medicine   Why:  February 26, 2016 at 0900 am   Contact information:   [place] [phone]         Medication List    TAKE these medications        albuterol (2.5 MG/3ML) 0.083% nebulizer solution  Commonly known as:  PROVENTIL  Take 3 mLs (2.5 mg total) by nebulization every 6 (six) hours as needed for wheezing or shortness of breath.     albuterol 108 (90 Base) MCG/ACT inhaler  Commonly known as:  PROVENTIL HFA;VENTOLIN HFA  Inhale 2 puffs into the lungs every 6 (six) hours as needed for wheezing or shortness of breath.     ALPRAZolam 0.5 MG tablet  Commonly known as:  XANAX  Take 1 tablet (0.5 mg total) by mouth at bedtime as needed for anxiety.     aspirin-acetaminophen-caffeine 250-250-65 MG tablet  Commonly known as:  EXCEDRIN MIGRAINE  Take 2 tablets by mouth every 6 (six) hours as needed for headache.     diphenhydrAMINE 25 mg capsule  Commonly known as:  BENADRYL  Take 25 mg by mouth every 6 (six) hours as needed. Reported on 01/19/2016     fluticasone furoate-vilanterol 100-25 MCG/INH Aepb  Commonly known as:  BREO ELLIPTA  Inhale 1 puff into the lungs daily.     hyaluronate sodium Gel  Apply 1 application topically 2 (two) times daily.     hydrocortisone 1 %  ointment  Apply 1 application topically 2 (two) times  daily.     LANACORT 10 1 % Crea  Generic drug:  Hydrocortisone Acetate  Apply topically. Reported on 01/08/2016     levofloxacin 750 MG tablet  Commonly known as:  LEVAQUIN  Take 1 tablet (750 mg total) by mouth daily.     lidocaine-prilocaine cream  Commonly known as:  EMLA  Apply 1 application topically as needed. Apply 1-2 hours prior to IV treatment     mirtazapine 30 MG tablet  Commonly known as:  REMERON  Take 1 tablet (30 mg total) by mouth at bedtime.     nicotine 21 mg/24hr patch  Commonly known as:  NICODERM CQ  Place 1 patch (21 mg total) onto the skin daily.     omeprazole 20 MG capsule  Commonly known as:  PRILOSEC  TAKE 1 CAPSULE BY MOUTH TWICE DAILY BEFORE A MEAL     predniSONE 10 MG tablet  Commonly known as:  DELTASONE  Take 4 tabs  daily with food x 4 days, then 3 tabs daily x 4 days, then 2 tabs daily x 4 days, then 1 tab daily x4 days then stop. #40     PROBIOTIC DAILY PO  Take by mouth.     prochlorperazine 10 MG tablet  Commonly known as:  COMPAZINE  Take 1 tablet (10 mg total) by mouth every 6 (six) hours as needed for nausea or vomiting.     ranitidine 150 MG tablet  Commonly known as:  ZANTAC  Take 150 mg by mouth daily as needed for heartburn.     Rivaroxaban 15 & 20 MG Tbpk  Take as directed on package: Start with one 72m tab orally twice a day with food. On Day 22, switch to one 227mdaily with food.     rivaroxaban 20 MG Tabs tablet  Commonly known as:  XARELTO  Take 1 tablet (20 mg total) by mouth daily with supper.  Start taking on:  02/21/2016          Disposition: 01-Home or Self Care  Discharged Condition: KaTashi Andujoas met maximum benefit of inpatient care and is medically stable and cleared for discharge.  Patient is pending follow up as above.      Time spent on disposition:  Greater than 35 minutes.   Signed: PeErick ColaceCNP-BC [place]ager # [phone] # [phone] no answer

## 2016-01-21 NOTE — Telephone Encounter (Signed)
Appointment has been modified. Nothing further was needed.

## 2016-01-22 ENCOUNTER — Inpatient Hospital Stay (HOSPITAL_COMMUNITY): Payer: Medicaid - Out of State

## 2016-01-22 DIAGNOSIS — M7989 Other specified soft tissue disorders: Secondary | ICD-10-CM

## 2016-01-22 DIAGNOSIS — I2699 Other pulmonary embolism without acute cor pulmonale: Secondary | ICD-10-CM

## 2016-01-22 LAB — CBC
HCT: 39 % (ref 36.0–46.0)
Hemoglobin: 11.9 g/dL — ABNORMAL LOW (ref 12.0–15.0)
MCH: 29.2 pg (ref 26.0–34.0)
MCHC: 30.5 g/dL (ref 30.0–36.0)
MCV: 95.8 fL (ref 78.0–100.0)
PLATELETS: 238 10*3/uL (ref 150–400)
RBC: 4.07 MIL/uL (ref 3.87–5.11)
RDW: 15.6 % — AB (ref 11.5–15.5)
WBC: 6.8 10*3/uL (ref 4.0–10.5)

## 2016-01-22 LAB — GLUCOSE, CAPILLARY
GLUCOSE-CAPILLARY: 94 mg/dL (ref 65–99)
GLUCOSE-CAPILLARY: 141 mg/dL — AB (ref 65–99)
Glucose-Capillary: 106 mg/dL — ABNORMAL HIGH (ref 65–99)
Glucose-Capillary: 86 mg/dL (ref 65–99)

## 2016-01-22 LAB — BASIC METABOLIC PANEL
Anion gap: 5 (ref 5–15)
BUN: 7 mg/dL (ref 6–20)
CALCIUM: 8.7 mg/dL — AB (ref 8.9–10.3)
CO2: 35 mmol/L — ABNORMAL HIGH (ref 22–32)
Chloride: 101 mmol/L (ref 101–111)
Creatinine, Ser: 0.6 mg/dL (ref 0.44–1.00)
GFR calc Af Amer: >60 mL/min (ref >60)
GLUCOSE: 91 mg/dL (ref 65–99)
POTASSIUM: 3 mmol/L — AB (ref 3.5–5.1)
SODIUM: 141 mmol/L (ref 135–145)

## 2016-01-22 LAB — PHOSPHORUS: Phosphorus: 3.1 mg/dL (ref 2.5–4.6)

## 2016-01-22 LAB — MAGNESIUM: MAGNESIUM: 2 mg/dL (ref 1.7–2.4)

## 2016-01-22 MED ORDER — RIVAROXABAN (XARELTO) VTE STARTER PACK (15 & 20 MG)
ORAL_TABLET | ORAL | Status: DC
Start: 2016-01-22 — End: 2016-02-27

## 2016-01-22 MED ORDER — PREDNISONE 10 MG PO TABS: ORAL_TABLET | ORAL | Status: DC

## 2016-01-22 MED ORDER — RIVAROXABAN 15 MG PO TABS
15.0000 mg | ORAL_TABLET | Freq: Two times a day (BID) | ORAL | Status: DC
  Administered 2016-01-22: 15 mg via ORAL
  Filled 2016-01-22: qty 1

## 2016-01-22 MED ORDER — RIVAROXABAN 20 MG PO TABS: 20.0000 mg | ORAL_TABLET | Freq: Every day | ORAL | Status: DC

## 2016-01-22 MED ORDER — LEVOFLOXACIN 750 MG PO TABS: 750.0000 mg | ORAL_TABLET | Freq: Every day | ORAL | Status: DC

## 2016-01-22 MED ORDER — HEPARIN SOD (PORK) LOCK FLUSH 100 UNIT/ML IV SOLN
500.0000 [IU] | INTRAVENOUS | Status: AC | PRN
  Administered 2016-01-22: 500 [IU]

## 2016-01-22 MED ORDER — RIVAROXABAN (XARELTO) EDUCATION KIT FOR DVT/PE PATIENTS
PACK | Freq: Once | Status: DC
  Filled 2016-01-22: qty 1

## 2016-01-22 NOTE — Progress Notes (Signed)
*  PRELIMINARY RESULTS* Vascular Ultrasound Lower extremity venous duplex has been completed.  Preliminary findings: No evidence of DVT or baker's cyst.   Landry Mellow, RDMS, RVT  01/22/2016, 2:59 PM

## 2016-01-22 NOTE — Progress Notes (Signed)
Admitted with hypercarbic respiratory failure and some improvement with steroids bronchodilators and oxygen, initially required BiPAP. Off oxygen today without  significant desaturation on coughing and ambulation. O/E - clear lungs, no ac muscles, s1s2 nml, no edema  recomm COPD exac -Continue Levaquin and prednisone -taper over 2 wks  PE - Reviewed CT chest with contrast which was ordered by oncology from 6/6-this showed small filling defect consistent with small pulmonary embolism. Rpt CT angiogram  6/26 shows stable PE  obtain ven duplex BLE Start xarelto '15mg'$  bid x 21 ds, then 20 mg daily - total 3 mnths  Lung CA - FU with dr Earlie Server  Emphasized smoking cessation to patient and daughter-she has severe anxiety  She has FU appt on 6/13 set up Dc today  Rigoberto Noel MD

## 2016-01-22 NOTE — Care Management Note (Signed)
Case Management Note  Patient Details  Name: Heather Banks MRN: [id number] Date of Birth: [birth year]  Subjective/Objective:                    Action/Plan:  Dr Elsworth Soho cancelled order for [facility] , patient has follow up with pulmonary . Patient aware.  Explained [place] letter to patient and her daughter [name] .  See previous note .   30 day free card for Xarelto given to patient.  Patient ambulated on room air saturation 90% . No home oxygen required.  Patient voiced understanding to above   Expected Discharge Date:                  Expected Discharge Plan:  Home/Self Care  In-House Referral:     Discharge planning Services  CM Consult, [facility], [facility]  Post Acute Care Choice:    Choice offered to:  Patient, Adult Children  DME Arranged:    DME Agency:     HH Arranged:    [place] Agency:     Status of Service:  Completed, signed off  If discussed at [employer] of Stay Meetings, dates discussed:    Additional Comments:  Marilu Favre, RN 01/22/2016, 11:30 AM

## 2016-01-22 NOTE — Discharge Instructions (Signed)
Information on my medicine - XARELTO (rivaroxaban)  This medication education was reviewed with me or my healthcare representative as part of my discharge preparation.  The pharmacist that spoke with me during my hospital stay was:  Lawson Radar, [place]? Xarelto was prescribed to treat blood clots that may have been found in the veins of your legs (deep vein thrombosis) or in your lungs (pulmonary embolism) and to reduce the risk of them occurring again.  What do you need to know about Xarelto? The starting dose is one 15 mg tablet taken TWICE daily with food for the FIRST 21 DAYS then on (enter date)  Tuesday, July 18th  the dose is changed to one 20 mg tablet taken ONCE A DAY with your evening meal.  DO NOT stop taking Xarelto without talking to the health care provider who prescribed the medication.  Refill your prescription for 20 mg tablets before you run out.  After discharge, you should have regular check-up appointments with your healthcare provider that is prescribing your Xarelto.  In the future your dose may need to be changed if your kidney function changes by a significant amount.  What do you do if you miss a dose? If you are taking Xarelto TWICE DAILY and you miss a dose, take it as soon as you remember. You may take two 15 mg tablets (total 30 mg) at the same time then resume your regularly scheduled 15 mg twice daily the next day.  If you are taking Xarelto ONCE DAILY and you miss a dose, take it as soon as you remember on the same day then continue your regularly scheduled once daily regimen the next day. Do not take two doses of Xarelto at the same time.   Important Safety Information Xarelto is a blood thinner medicine that can cause bleeding. You should call your healthcare provider right away if you experience any of the following: ? Bleeding from an injury or your nose that does not stop. ? Unusual colored urine (red or  dark brown) or unusual colored stools (red or black). ? Unusual bruising for unknown reasons. ? A serious fall or if you hit your head (even if there is no bleeding).  Some medicines may interact with Xarelto and might increase your risk of bleeding while on Xarelto. To help avoid this, consult your healthcare provider or pharmacist prior to using any new prescription or non-prescription medications, including herbals, vitamins, non-steroidal anti-inflammatory drugs (NSAIDs) and supplements.  This website has more information on Xarelto: [email or web address]

## 2016-01-23 LAB — CULTURE, BLOOD (ROUTINE X 2)
CULTURE: NO GROWTH
CULTURE: NO GROWTH

## 2016-01-27 ENCOUNTER — Other Ambulatory Visit: Payer: Self-pay | Admitting: Pulmonary Disease

## 2016-02-05 ENCOUNTER — Encounter: Payer: Self-pay | Admitting: Medical Oncology

## 2016-02-05 DIAGNOSIS — C3492 Malignant neoplasm of unspecified part of left bronchus or lung: Secondary | ICD-10-CM

## 2016-02-05 NOTE — Progress Notes (Signed)
BMS 370: Group A, Arm B1, 6 month Follow-up I called patient to complete patient's 6 month follow-up. EQ-5D-3L questionnaire completed over the phone with me reading her the question and giving her the three options for response and then recording her responses. Inquired with patient how she was doing. Patient reports to have been in hospital x 4 days for pneumonia and discharged on June 23th. Patient reports to be slowly improving, energy has improved and she is getting about without much difficulty and denies any breathing concerns at this time. Patient also reports that she is being treated for a "small blood clot."  Patient had last chemotherapy treatment of Tecentriq on 09/11/15 and completed radiation treatment on 11/16/2015.  All patient's questions answered to her satisfaction and she was encouraged to contact clinic, Dr. Julien Nordmann or myself with any questions or concerns she may have.  Heather Dan, RN, BSN Clinical Research 02/05/2016 3:22 PM

## 2016-02-06 ENCOUNTER — Ambulatory Visit (INDEPENDENT_AMBULATORY_CARE_PROVIDER_SITE_OTHER)
Admission: RE | Admit: 2016-02-06 | Discharge: 2016-02-06 | Disposition: A | Discharge status: Home / Self Care | Payer: Self-pay | Source: Ambulatory Visit | Attending: Adult Health | Admitting: Adult Health

## 2016-02-06 ENCOUNTER — Encounter: Payer: Self-pay | Admitting: Adult Health

## 2016-02-06 ENCOUNTER — Ambulatory Visit (INDEPENDENT_AMBULATORY_CARE_PROVIDER_SITE_OTHER): Payer: Self-pay | Admitting: Adult Health

## 2016-02-06 VITALS — BP 108/70 | HR 100 | Temp 98.2°F | Ht 65.0 in | Wt 196.0 lb

## 2016-02-06 DIAGNOSIS — Z86711 Personal history of pulmonary embolism: Secondary | ICD-10-CM | POA: Insufficient documentation

## 2016-02-06 DIAGNOSIS — J441 Chronic obstructive pulmonary disease with (acute) exacerbation: Secondary | ICD-10-CM

## 2016-02-06 DIAGNOSIS — C3412 Malignant neoplasm of upper lobe, left bronchus or lung: Secondary | ICD-10-CM

## 2016-02-06 DIAGNOSIS — J189 Pneumonia, unspecified organism: Secondary | ICD-10-CM

## 2016-02-06 DIAGNOSIS — I2699 Other pulmonary embolism without acute cor pulmonale: Secondary | ICD-10-CM

## 2016-02-06 NOTE — Assessment & Plan Note (Signed)
Lung cancer -cont w/ serial CT follow up and oncology follow up

## 2016-02-06 NOTE — Progress Notes (Signed)
Subjective:    Patient ID: Heather Banks, female    DOB: 08/28/1950, 65 y.o.   MRN: [id number]  HPI 65 yo smoker with COPD and adenocarcinoma lung, stage IV.  TEST [name]  Admitted 06/1314 for COPD exacerbation.  CT showed bilateral upper lobe lung mass w/ minmally enlarged hilar and mediastinal nodes, hypermetabolic on PET She was discharged on oxygen at 3 L. PFT showed an FEV1 at 46%, ratio of 59. Diffusing capacity was decreased at 56% , FVC was 60% Underwent ENB guided biopsy ,  LUL bx was neg, RUL-2 targets showed adenoCA- felt to be synchronous tumors Had disease progression on chemoRx x 6 cycles Completed  immunotherapy  CT chest 04/2015 showed a slight increase in the size of lesions compared to 02/2015  Palliative Radiotherapy in LUL mass completed in 10/2015  CT chest 01/01/16 , +PE, decreased in LUL mass. Adenopathy resolved. Stable right sided nodules.   02/06/2016 Post hospital follow up : COPD and Lung cancer  Patient returns for a follow-up from recent hospitalization. Patient was admitted June 23 through June 27 for acute on chronic hypoxemic and hypercarbic respiratory failure secondary to severe acute COPD exacerbation,plus or minus healthcare associated pneumonia. CT chest showed a small PE in the left lateral basal segment of the pulmonary artery was started on Xarelto. .She did require initial BiPAP support. She was treated with antibiotics, steroids and nebulized bronchodilators. Venous Doppler was negative. Plans for Xarelto for 3 months.  Since discharge she is feeling better, cough and congestion are imprvoing .  She denies hemoptysis , chest pain, orthopnea or edema .  Remains on BREO .  Has cut back on smoking, cessation encouarged. CXR today with no acute findings, LUL mass and RUL nodule stable.    Past Medical History  Diagnosis Date  . COPD (chronic obstructive pulmonary disease) (HCC)     Albuterol neb as needed;Pulmicort neb daily  . GERD  (gastroesophageal reflux disease)     takes Pantoprazole and Zantac daily  . Lung mass   . Pneumonia     hx of-last time about 4+yrs ago  . History of bronchitis     >5 yrs ago  . History of migraine     last one 2 wks ago  . Panic attacks     but doesn't take any meds  . Encounter for antineoplastic chemotherapy 01/21/2015  . Full code status 05/01/2015  . Adenocarcinoma of left lung, stage 4 ([place]) 07/09/2014  . Hx of radiation therapy 10/29/2015 to 11/16/2015    The Left upper lobe was treated to 37.5 Gy in 15 fractions at 2.5 Gy per fraction    Current Outpatient Prescriptions on File Prior to Visit  Medication Sig Dispense Refill  . albuterol (PROVENTIL HFA;VENTOLIN HFA) 108 (90 BASE) MCG/ACT inhaler Inhale 2 puffs into the lungs every 6 (six) hours as needed for wheezing or shortness of breath. 1 Inhaler 6  . albuterol (PROVENTIL) (2.5 MG/3ML) 0.083% nebulizer solution Take 3 mLs (2.5 mg total) by nebulization every 6 (six) hours as needed for wheezing or shortness of breath. 75 mL 3  . ALPRAZolam (XANAX) 0.5 MG tablet Take 1 tablet (0.5 mg total) by mouth at bedtime as needed for anxiety. 30 tablet 0  . aspirin-acetaminophen-caffeine (EXCEDRIN MIGRAINE) [id number] MG per tablet Take 2 tablets by mouth every 6 (six) hours as needed for headache.    Marland Kitchen BREO ELLIPTA 100-25 MCG/INH AEPB INHALE 1 PUFF INTO THE LUNGS DAILY 60 each 1  .  diphenhydrAMINE (BENADRYL) 25 mg capsule Take 25 mg by mouth every 6 (six) hours as needed. Reported on 01/19/2016    . hyaluronate sodium (RADIAPLEXRX) GEL Apply 1 application topically 2 (two) times daily.    . hydrocortisone 1 % ointment Apply 1 application topically 2 (two) times daily. 30 g 0  . Hydrocortisone Acetate (LANACORT 10) 1 % CREA Apply topically. Reported on 01/08/2016    . lidocaine-prilocaine (EMLA) cream Apply 1 application topically as needed. Apply 1-2 hours prior to IV treatment 30 g 1  . mirtazapine (REMERON) 30 MG tablet Take 1 tablet (30  mg total) by mouth at bedtime. 30 tablet 0  . nicotine (NICODERM CQ) 21 mg/24hr patch Place 1 patch (21 mg total) onto the skin daily. 28 patch 0  . predniSONE (DELTASONE) 10 MG tablet Take 4 tabs  daily with food x 4 days, then 3 tabs daily x 4 days, then 2 tabs daily x 4 days, then 1 tab daily x4 days then stop. #40 40 tablet 0  . Probiotic Product (PROBIOTIC DAILY PO) Take by mouth.    . prochlorperazine (COMPAZINE) 10 MG tablet Take 1 tablet (10 mg total) by mouth every 6 (six) hours as needed for nausea or vomiting. 30 tablet 0  . ranitidine (ZANTAC) 150 MG tablet Take 150 mg by mouth daily as needed for heartburn.    . Rivaroxaban 15 & 20 MG TBPK Take as directed on package: Start with one '15mg'$  tab orally twice a day with food. On Day 22, switch to one '20mg'$  daily with food. 51 each 0  . omeprazole (PRILOSEC) 20 MG capsule TAKE 1 CAPSULE BY MOUTH TWICE DAILY BEFORE A MEAL (Patient not taking: Reported on 02/06/2016) 60 capsule 0  . [START ON 02/21/2016] rivaroxaban (XARELTO) 20 MG TABS tablet Take 1 tablet (20 mg total) by mouth daily with supper. (Patient not taking: Reported on 02/06/2016) 30 tablet 2   Current Facility-Administered Medications on File Prior to Visit  Medication Dose Route Frequency Provider Last Rate Last Dose  . heparin lock flush 100 unit/mL  500 Units Intracatheter Once PRN Curt Bears, MD      . sodium chloride 0.9 % injection 10 mL  10 mL Intracatheter PRN Curt Bears, MD   10 mL at 07/31/15 1635  . sodium chloride 0.9 % injection 10 mL  10 mL Intracatheter PRN Curt Bears, MD         Review of Systems neg for any significant sore throat, dysphagia, itching, sneezing, nasal congestion or excess/ purulent secretions, fever, chills, sweats, unintended wt loss, pleuritic or exertional cp, hempoptysis, orthopnea pnd or change in chronic leg swelling. Also denies presyncope, palpitations, heartburn, abdominal pain, nausea, vomiting, diarrhea or change in bowel or  urinary habits, dysuria,hematuria, rash, arthralgias, visual complaints, headache, numbness weakness or ataxia.     Objective:   Physical Exam Filed Vitals:   02/06/16 1019  BP: 108/70  Pulse: 100  Temp: 98.2 F (36.8 C)  TempSrc: Oral  Height: '5\' 5"'$  (1.651 m)  Weight: 196 lb (88.905 kg)  SpO2: 95%    Gen. Pleasant, eldelry , in no distress ENT - no lesions, no post nasal drip, poor dention  Neck: No JVD, no thyromegaly, no carotid bruits Lungs: no use of accessory muscles, no dullness to percussion, clear without rales or rhonchi  Cardiovascular: Rhythm regular, heart sounds  normal, no murmurs or gallops, no peripheral edema Musculoskeletal: No deformities, no cyanosis or clubbing  Skin : macular rash -improving  along LE.    Ervine Witucki NP-C  [facility]  02/06/2016

## 2016-02-06 NOTE — Assessment & Plan Note (Signed)
Recent excerbation now resolved   Plan  Continue on BREO  Follow up with Oncology as planned .  Work on not smoking .  Continue on Xarelto.  Follow up Dr. Elsworth Soho  In 2-3  months and As needed

## 2016-02-06 NOTE — Patient Instructions (Addendum)
Continue on BREO  Follow up with Oncology as planned .  Work on not smoking .  Continue on Xarelto.  Follow up Dr. Elsworth Soho  In 2-3  months and As needed

## 2016-02-06 NOTE — Assessment & Plan Note (Signed)
PE dx 01/01/16 on Xarelto .  Plans for 3 month follow up rx .  Will discuss on return longevitity .

## 2016-02-13 ENCOUNTER — Telehealth: Payer: Self-pay | Admitting: Internal Medicine

## 2016-02-13 NOTE — Telephone Encounter (Signed)
Called patient to confirm appointment. Left message. Appointment letter and schedule mailed. Merleen Nicely.

## 2016-02-26 ENCOUNTER — Encounter: Payer: Self-pay | Admitting: Family Medicine

## 2016-02-26 ENCOUNTER — Telehealth: Payer: Self-pay | Admitting: Pulmonary Disease

## 2016-02-26 ENCOUNTER — Ambulatory Visit (INDEPENDENT_AMBULATORY_CARE_PROVIDER_SITE_OTHER): Payer: Medicaid - Out of State | Admitting: Family Medicine

## 2016-02-26 VITALS — BP 130/80 | HR 115 | Temp 98.5°F | Resp 20 | Ht 65.0 in | Wt 195.0 lb

## 2016-02-26 DIAGNOSIS — Z7689 Persons encountering health services in other specified circumstances: Secondary | ICD-10-CM

## 2016-02-26 DIAGNOSIS — Z7189 Other specified counseling: Secondary | ICD-10-CM

## 2016-02-26 DIAGNOSIS — IMO0001 Reserved for inherently not codable concepts without codable children: Secondary | ICD-10-CM

## 2016-02-26 DIAGNOSIS — R030 Elevated blood-pressure reading, without diagnosis of hypertension: Secondary | ICD-10-CM

## 2016-02-26 DIAGNOSIS — E876 Hypokalemia: Secondary | ICD-10-CM

## 2016-02-26 LAB — LIPID PANEL
Cholesterol: 261 mg/dL — ABNORMAL HIGH (ref 125–200)
HDL: 52 mg/dL (ref >=46)
LDL CALC: 172 mg/dL — AB (ref <130)
TRIGLYCERIDES: 185 mg/dL — AB (ref <150)
Total CHOL/HDL Ratio: 5 Ratio (ref <=5.0)
VLDL: 37 mg/dL — AB (ref <30)

## 2016-02-26 LAB — COMPLETE METABOLIC PANEL WITH GFR
ALT: 12 U/L (ref 6–29)
AST: 16 U/L (ref 10–35)
Albumin: 4.3 g/dL (ref 3.6–5.1)
Alkaline Phosphatase: 139 U/L — ABNORMAL HIGH (ref 33–130)
BILIRUBIN TOTAL: 0.3 mg/dL (ref 0.2–1.2)
BUN: 9 mg/dL (ref 7–25)
CHLORIDE: 104 mmol/L (ref 98–110)
CO2: 23 mmol/L (ref 20–31)
CREATININE: 0.58 mg/dL (ref 0.50–0.99)
Calcium: 9.7 mg/dL (ref 8.6–10.4)
GFR, Est Non African American: >89 mL/min (ref >=60)
GLUCOSE: 108 mg/dL — AB (ref 65–99)
Potassium: 4.5 mmol/L (ref 3.5–5.3)
SODIUM: 140 mmol/L (ref 135–146)
TOTAL PROTEIN: 7.2 g/dL (ref 6.1–8.1)

## 2016-02-26 MED ORDER — TRAMADOL HCL 50 MG PO TABS: 50.0000 mg | ORAL_TABLET | Freq: Two times a day (BID) | ORAL | 0 refills | Status: DC | PRN

## 2016-02-26 NOTE — Telephone Encounter (Signed)
Spoke with the pt and notified of recs per RA  She verbalized understanding  Rx was called to [facility]

## 2016-02-26 NOTE — Patient Instructions (Signed)
Talk to Dr. Earlie Server about what to take for pain Will let you know if you need to start taking potassium Follow-up in 3 months.

## 2016-02-26 NOTE — Progress Notes (Signed)
Heather Banks, is a 65 y.o. female  GXQ:[id number]  [id number]:[id number]  DOB - [birth year]  CC:  Chief Complaint  Patient presents with  . Establish Care  . Back Pain    mid back pain since hospital stay        HPI: Heather Banks is a 65 y.o. female here to establish care. She has a two year history of lung cancer. She has had chemo, immunotherapy and radiation. He last CT reports her lung lesions are stable. She is followed at [facility] by Dr. Earlie Server. She also has a history of COPD, GERD, and migraine headaches.  Her medications are prescribed by either pulmonology or oncology and are listed in her medication list and are reviewed. He major complaint today is of upper back and chest pain since her last hospitalization. She does continue to smoke tobacco but is trying to quit. Her last labs showed a K+ of 3.0 and she is eating a banana a day. She does some walking regularly and tries to eat a lot of protein and vegetables.   Allergies  Allergen Reactions  . Ativan [Lorazepam]    Past Medical History:  Diagnosis Date  . Adenocarcinoma of left lung, stage 4 ([place]) 07/09/2014  . COPD (chronic obstructive pulmonary disease) (HCC)    Albuterol neb as needed;Pulmicort neb daily  . Encounter for antineoplastic chemotherapy 01/21/2015  . Full code status 05/01/2015  . GERD (gastroesophageal reflux disease)    takes Pantoprazole and Zantac daily  . History of bronchitis    >5 yrs ago  . History of migraine    last one 2 wks ago  . Hx of radiation therapy 10/29/2015 to 11/16/2015   The Left upper lobe was treated to 37.5 Gy in 15 fractions at 2.5 Gy per fraction  . Lung mass   . Panic attacks    but doesn't take any meds  . Pneumonia    hx of-last time about 4+yrs ago   Current Outpatient Prescriptions on File Prior to Visit  Medication Sig Dispense Refill  . albuterol (PROVENTIL HFA;VENTOLIN HFA) 108 (90 BASE) MCG/ACT inhaler Inhale 2 puffs into the lungs every 6 (six) hours as  needed for wheezing or shortness of breath. 1 Inhaler 6  . albuterol (PROVENTIL) (2.5 MG/3ML) 0.083% nebulizer solution Take 3 mLs (2.5 mg total) by nebulization every 6 (six) hours as needed for wheezing or shortness of breath. 75 mL 3  . aspirin-acetaminophen-caffeine (EXCEDRIN MIGRAINE) [phone] MG per tablet Take 2 tablets by mouth every 6 (six) hours as needed for headache.    Marland Kitchen BREO ELLIPTA 100-25 MCG/INH AEPB INHALE 1 PUFF INTO THE LUNGS DAILY 60 each 1  . hydrocortisone 1 % ointment Apply 1 application topically 2 (two) times daily. 30 g 0  . Hydrocortisone Acetate (LANACORT 10) 1 % CREA Apply topically. Reported on 01/08/2016    . lidocaine-prilocaine (EMLA) cream Apply 1 application topically as needed. Apply 1-2 hours prior to IV treatment 30 g 1  . mirtazapine (REMERON) 30 MG tablet Take 1 tablet (30 mg total) by mouth at bedtime. 30 tablet 0  . nicotine (NICODERM CQ) 21 mg/24hr patch Place 1 patch (21 mg total) onto the skin daily. 28 patch 0  . omeprazole (PRILOSEC) 20 MG capsule TAKE 1 CAPSULE BY MOUTH TWICE DAILY BEFORE A MEAL 60 capsule 0  . Probiotic Product (PROBIOTIC DAILY PO) Take by mouth.    . prochlorperazine (COMPAZINE) 10 MG tablet Take 1 tablet (10 mg total) by  mouth every 6 (six) hours as needed for nausea or vomiting. 30 tablet 0  . ranitidine (ZANTAC) 150 MG tablet Take 150 mg by mouth daily as needed for heartburn.    . rivaroxaban (XARELTO) 20 MG TABS tablet Take 1 tablet (20 mg total) by mouth daily with supper. 30 tablet 2  . ALPRAZolam (XANAX) 0.5 MG tablet Take 1 tablet (0.5 mg total) by mouth at bedtime as needed for anxiety. (Patient not taking: Reported on 02/26/2016) 30 tablet 0  . diphenhydrAMINE (BENADRYL) 25 mg capsule Take 25 mg by mouth every 6 (six) hours as needed. Reported on 01/19/2016    . hyaluronate sodium (RADIAPLEXRX) GEL Apply 1 application topically 2 (two) times daily.    . predniSONE (DELTASONE) 10 MG tablet Take 4 tabs  daily with food x 4  days, then 3 tabs daily x 4 days, then 2 tabs daily x 4 days, then 1 tab daily x4 days then stop. #40 (Patient not taking: Reported on 02/26/2016) 40 tablet 0  . Rivaroxaban 15 & 20 MG TBPK Take as directed on package: Start with one '15mg'$  tab orally twice a day with food. On Day 22, switch to one '20mg'$  daily with food. (Patient not taking: Reported on 02/26/2016) 51 each 0   Current Facility-Administered Medications on File Prior to Visit  Medication Dose Route Frequency Provider Last Rate Last Dose  . heparin lock flush 100 unit/mL  500 Units Intracatheter Once PRN Curt Bears, MD      . sodium chloride 0.9 % injection 10 mL  10 mL Intracatheter PRN Curt Bears, MD   10 mL at 07/31/15 1635  . sodium chloride 0.9 % injection 10 mL  10 mL Intracatheter PRN Curt Bears, MD       Family History  Problem Relation Age of Onset  . COPD Father    Social History   Social History  . Marital status: Widowed    Spouse name: N/A  . Number of children: N/A  . Years of education: N/A   Occupational History  . Not on file.   Social History Main Topics  . Smoking status: Current Some Day Smoker    Packs/day: 0.25    Years: 44.00    Types: Cigarettes  . Smokeless tobacco: Never Used     Comment: 2 cigs a day  . Alcohol use No     Comment: no alcohol in 7 yrs   . Drug use: No  . Sexual activity: Not on file   Other Topics Concern  . Not on file   Social History Narrative  . No narrative on file    Review of Systems: Constitutional: Negative for fever, chills, appetite change, weight loss. Positive for Fatigue Skin: Scaley, macular rash related to immunotherapy HENT: Negative for ear pain, ear discharge.nose bleeds Eyes: Negative for pain, discharge, redness, itching and visual disturbance. Neck: Negative for pain, stiffness Respiratory: Positve for cough, shortness of breath, wheezing related to COPD  Cardiovascular: Negative for chest pain, palpitations and leg  swelling. Gastrointestinal: Negative for abdominal pain, nausea, vomiting, diarrhea, constipations Genitourinary: Negative for dysuria, urgency, frequency, hematuria,  Musculoskeletal: Positive for back pain, joint pain, joint  swelling, and gait problem.Negative for weakness. Neurological: Negative for dizziness, tremors, seizures, syncope,   light-headedness, numbness and headaches.  Hematological: Negative for easy bruising or bleeding Psychiatric/Behavioral: Negative for depression, anxiety, decreased concentration, confusion   Objective:   Vitals:   02/26/16 0839  BP: (!) 152/105  Pulse: (!) 115  Resp: 20  Temp: 98.5 F (36.9 C)    Physical Exam: Constitutional: Patient appears well-developed and well-nourished. No distress.Appear older than stated age. HENT: Normocephalic, atraumatic, External right and left ear normal. Oropharynx is clear and moist.  Eyes: Conjunctivae and EOM are normal. PERRLA, no scleral icterus. Neck: Normal ROM. Neck supple. No lymphadenopathy, No thyromegaly. CVS: RRR, S1/S2 +, no murmurs, no gallops, no rubs Pulmonary: Diffuse wheezing, no distress.  Abdominal: Soft. Normoactive BS,, no distension, tenderness, rebound or guarding.  Musculoskeletal: Normal range of motion. No edema and no tenderness.  Neuro: Alert.Normal muscle tone coordination. Non-focal Skin: Wide spread scaley, macular rash, some bruising from scratching present Psychiatric: Normal mood and affect. Behavior, judgment, thought content normal.  Lab Results  Component Value Date   WBC 6.8 01/22/2016   HGB 11.9 (L) 01/22/2016   HCT 39.0 01/22/2016   MCV 95.8 01/22/2016   PLT 238 01/22/2016   Lab Results  Component Value Date   CREATININE 0.60 01/22/2016   BUN 7 01/22/2016   NA 141 01/22/2016   K 3.0 (L) 01/22/2016   CL 101 01/22/2016   CO2 35 (H) 01/22/2016    No results found for: HGBA1C Lipid Panel  No results found for: CHOL, TRIG, HDL, CHOLHDL, VLDL, LDLCALC      Assessment and plan:   1. Encounter to establish care -I have reviewed information presented by the patient and pertinent information from her chart.  2. Elevated blood pressure  - Lipid panel -Recheck gives a normal pressure  3. Hypokalemia  - COMPLETE METABOLIC PANEL WITH GFR   Return in about 3 months (around 05/28/2016).  The patient was given clear instructions to go to ER or return to medical center if symptoms don't improve, worsen or new problems develop. The patient verbalized understanding.    Micheline Chapman FNP  02/26/2016, 9:14 AM

## 2016-02-26 NOTE — Telephone Encounter (Signed)
Called and spoke with pt and she stated that she was in the hospital 1 month ago with blood clots and PNA---she stated that she is still having severe pain in her mid back and this goes into her chest.  She stated that it takes her breath at times and she has been unable to relieve this pain with OTC meds.  Wanting to know what RA feels would help with this.  Please advise. Thanks  Allergies  Allergen Reactions  . Ativan [Lorazepam]

## 2016-02-26 NOTE — Telephone Encounter (Signed)
Tramadol 50 mg twice a day when necessary #30

## 2016-02-27 ENCOUNTER — Emergency Department (HOSPITAL_COMMUNITY): Payer: Medicaid - Out of State

## 2016-02-27 ENCOUNTER — Inpatient Hospital Stay (HOSPITAL_COMMUNITY)
Admission: EM | Admit: 2016-02-27 | Discharge: 2016-03-04 | DRG: 190 | Disposition: A | Discharge status: Home / Self Care | Payer: Medicaid - Out of State | Attending: Internal Medicine | Admitting: Internal Medicine

## 2016-02-27 ENCOUNTER — Encounter (HOSPITAL_COMMUNITY): Payer: Self-pay | Admitting: Emergency Medicine

## 2016-02-27 DIAGNOSIS — F329 Major depressive disorder, single episode, unspecified: Secondary | ICD-10-CM | POA: Diagnosis present

## 2016-02-27 DIAGNOSIS — J9601 Acute respiratory failure with hypoxia: Secondary | ICD-10-CM | POA: Diagnosis present

## 2016-02-27 DIAGNOSIS — C3492 Malignant neoplasm of unspecified part of left bronchus or lung: Secondary | ICD-10-CM | POA: Diagnosis present

## 2016-02-27 DIAGNOSIS — J441 Chronic obstructive pulmonary disease with (acute) exacerbation: Secondary | ICD-10-CM | POA: Diagnosis present

## 2016-02-27 DIAGNOSIS — Z7982 Long term (current) use of aspirin: Secondary | ICD-10-CM

## 2016-02-27 DIAGNOSIS — Z7901 Long term (current) use of anticoagulants: Secondary | ICD-10-CM

## 2016-02-27 DIAGNOSIS — Z86711 Personal history of pulmonary embolism: Secondary | ICD-10-CM | POA: Diagnosis present

## 2016-02-27 DIAGNOSIS — F32A Depression, unspecified: Secondary | ICD-10-CM | POA: Diagnosis present

## 2016-02-27 DIAGNOSIS — Z888 Allergy status to other drugs, medicaments and biological substances status: Secondary | ICD-10-CM

## 2016-02-27 DIAGNOSIS — M8448XA Pathological fracture, other site, initial encounter for fracture: Secondary | ICD-10-CM | POA: Diagnosis present

## 2016-02-27 DIAGNOSIS — F17200 Nicotine dependence, unspecified, uncomplicated: Secondary | ICD-10-CM | POA: Diagnosis present

## 2016-02-27 DIAGNOSIS — J449 Chronic obstructive pulmonary disease, unspecified: Secondary | ICD-10-CM | POA: Diagnosis present

## 2016-02-27 DIAGNOSIS — IMO0001 Reserved for inherently not codable concepts without codable children: Secondary | ICD-10-CM | POA: Diagnosis present

## 2016-02-27 DIAGNOSIS — Z9221 Personal history of antineoplastic chemotherapy: Secondary | ICD-10-CM

## 2016-02-27 DIAGNOSIS — F17210 Nicotine dependence, cigarettes, uncomplicated: Secondary | ICD-10-CM | POA: Diagnosis present

## 2016-02-27 DIAGNOSIS — K219 Gastro-esophageal reflux disease without esophagitis: Secondary | ICD-10-CM | POA: Diagnosis present

## 2016-02-27 LAB — CBC WITH DIFFERENTIAL/PLATELET
BASOS PCT: 1 %
Basophils Absolute: 0.1 10*3/uL (ref 0.0–0.1)
EOS ABS: 0.4 10*3/uL (ref 0.0–0.7)
EOS PCT: 4 %
HEMATOCRIT: 40.6 % (ref 36.0–46.0)
HEMOGLOBIN: 13.5 g/dL (ref 12.0–15.0)
LYMPHS PCT: 21 %
Lymphs Abs: 1.9 10*3/uL (ref 0.7–4.0)
MCH: 31.3 pg (ref 26.0–34.0)
MCHC: 33.3 g/dL (ref 30.0–36.0)
MCV: 94 fL (ref 78.0–100.0)
MONOS PCT: 12 %
Monocytes Absolute: 1.1 10*3/uL — ABNORMAL HIGH (ref 0.1–1.0)
NEUTROS ABS: 5.4 10*3/uL (ref 1.7–7.7)
NEUTROS PCT: 62 %
Platelets: 375 10*3/uL (ref 150–400)
RBC: 4.32 MIL/uL (ref 3.87–5.11)
RDW: 14.8 % (ref 11.5–15.5)
WBC: 8.9 10*3/uL (ref 4.0–10.5)

## 2016-02-27 LAB — I-STAT TROPONIN, ED: TROPONIN I, POC: 0 ng/mL (ref 0.00–0.08)

## 2016-02-27 LAB — BASIC METABOLIC PANEL
Anion gap: 8 (ref 5–15)
BUN: 14 mg/dL (ref 6–20)
CHLORIDE: 105 mmol/L (ref 101–111)
CO2: 25 mmol/L (ref 22–32)
CREATININE: 0.52 mg/dL (ref 0.44–1.00)
Calcium: 9.2 mg/dL (ref 8.9–10.3)
GFR calc Af Amer: >60 mL/min (ref >60)
GFR calc non Af Amer: >60 mL/min (ref >60)
GLUCOSE: 167 mg/dL — AB (ref 65–99)
POTASSIUM: 3.8 mmol/L (ref 3.5–5.1)
SODIUM: 138 mmol/L (ref 135–145)

## 2016-02-27 MED ORDER — ALBUTEROL SULFATE (2.5 MG/3ML) 0.083% IN NEBU
5.0000 mg | INHALATION_SOLUTION | Freq: Once | RESPIRATORY_TRACT | Status: AC
  Administered 2016-02-27: 5 mg via RESPIRATORY_TRACT
  Filled 2016-02-27: qty 6

## 2016-02-27 NOTE — ED Notes (Signed)
MD notified of pt drop in O2 saturation and pt placed onto 4L O2 via [place]

## 2016-02-27 NOTE — ED Provider Notes (Signed)
[facility] DEPT Provider Note   CSN: [id number] Arrival date & time: 02/27/16  2020  First Provider Contact:  None       History   Chief Complaint Chief Complaint  Patient presents with  . Shortness of Breath    HPI Heather Banks is a 65 y.o. female.  Patient presents with shortness of breath. She has a history of COPD. She also was recently admitted to the hospital about a month ago for pneumonia and pulmonary embolus. She was started on Xarelto. She is also being treated for stage IV lung cancer.  She is not on oxygen at home. She states over the last 2-3 days had worsening shortness of breath and wheezing. She's been using her nebulizer machine at home without improvement in symptoms. He has some ongoing chest and back pain that she's had since her last hospitalization. She states it seems to be improving. She denies any fevers. No productive cough. She was given a DuoNeb, 1 albuterol and solumedrol by EMS.      Shortness of Breath  Associated symptoms include chest pain and shortness of breath. Pertinent negatives include no abdominal pain and no headaches.    Past Medical History:  Diagnosis Date  . Adenocarcinoma of left lung, stage 4 ([place]) 07/09/2014  . COPD (chronic obstructive pulmonary disease) (HCC)    Albuterol neb as needed;Pulmicort neb daily  . Encounter for antineoplastic chemotherapy 01/21/2015  . Full code status 05/01/2015  . GERD (gastroesophageal reflux disease)    takes Pantoprazole and Zantac daily  . History of bronchitis    >5 yrs ago  . History of migraine    last one 2 wks ago  . Hx of radiation therapy 10/29/2015 to 11/16/2015   The Left upper lobe was treated to 37.5 Gy in 15 fractions at 2.5 Gy per fraction  . Lung mass   . Panic attacks    but doesn't take any meds  . Pneumonia    hx of-last time about 4+yrs ago    Patient Active Problem List   Diagnosis Date Noted  . Pulmonary embolism ([place]) 02/06/2016  . Respiratory failure ([place])  01/18/2016  . Primary cancer of left upper lobe of lung ([place]) 10/24/2015  . Drug-induced skin rash 07/31/2015  . Encounter for antineoplastic immunotherapy 06/19/2015  . Chronic fatigue 05/22/2015  . Nausea without vomiting 05/22/2015  . Full code status 05/01/2015  . COPD (chronic obstructive pulmonary disease) ([place]) 02/05/2015  . Encounter for antineoplastic chemotherapy 01/21/2015  . Depression (emotion) 09/05/2014  . Generalized anxiety disorder 08/21/2014  . Smoking 07/11/2014  . Adenocarcinoma of left lung, stage 4 ([place]) 07/09/2014  . COPD exacerbation ([place]) 07/09/2014    Past Surgical History:  Procedure Laterality Date  . ABDOMINAL HYSTERECTOMY    . APPENDECTOMY    . OOPHORECTOMY    . PORTACATH PLACEMENT  jan. 2016  . TONSILLECTOMY    . VIDEO BRONCHOSCOPY WITH ENDOBRONCHIAL NAVIGATION N/A 08/14/2014   Procedure: VIDEO BRONCHOSCOPY WITH ENDOBRONCHIAL NAVIGATION;  Surgeon: Rigoberto Noel, MD;  Location: [facility];  Service: Thoracic;  Laterality: N/A;    OB History    No data available       Home Medications    Prior to Admission medications   Medication Sig Start Date End Date Taking? Authorizing Provider  acidophilus (RISAQUAD) CAPS capsule Take 1 capsule by mouth daily.   Yes Historical Provider, MD  albuterol (PROVENTIL HFA;VENTOLIN HFA) 108 (90 BASE) MCG/ACT inhaler Inhale 2 puffs into the lungs every  6 (six) hours as needed for wheezing or shortness of breath. 06/15/15  Yes Rigoberto Noel, MD  albuterol (PROVENTIL) (2.5 MG/3ML) 0.083% nebulizer solution Take 3 mLs (2.5 mg total) by nebulization every 6 (six) hours as needed for wheezing or shortness of breath. 06/13/15  Yes Rigoberto Noel, MD  aspirin-acetaminophen-caffeine (EXCEDRIN MIGRAINE) [phone] MG per tablet Take 2 tablets by mouth every 6 (six) hours as needed for headache.   Yes Historical Provider, MD  diphenhydrAMINE (BENADRYL) 25 mg capsule Take 25 mg by mouth every 6 (six) hours as needed for itching.  Reported on 01/19/2016   Yes Historical Provider, MD  fluticasone furoate-vilanterol (BREO ELLIPTA) 100-25 MCG/INH AEPB Inhale 1 puff into the lungs at bedtime.   Yes Historical Provider, MD  hydrocortisone 1 % ointment Apply 1 application topically 2 (two) times daily. 01/08/16  Yes Curt Bears, MD  lidocaine-prilocaine (EMLA) cream Apply 1 application topically as needed (prior to accessing port).   Yes Historical Provider, MD  mirtazapine (REMERON) 30 MG tablet Take 30 mg by mouth at bedtime as needed (for sleep).   Yes Historical Provider, MD  nicotine (NICODERM CQ) 21 mg/24hr patch Place 1 patch (21 mg total) onto the skin daily. 08/15/14  Yes Deneise Lever, MD  prochlorperazine (COMPAZINE) 10 MG tablet Take 1 tablet (10 mg total) by mouth every 6 (six) hours as needed for nausea or vomiting. 01/08/16  Yes Curt Bears, MD  ranitidine (ZANTAC) 150 MG tablet Take 150 mg by mouth 2 (two) times daily.    Yes Historical Provider, MD  rivaroxaban (XARELTO) 20 MG TABS tablet Take 1 tablet (20 mg total) by mouth daily with supper. 02/21/16  Yes Erick Colace, NP  traMADol (ULTRAM) 50 MG tablet Take 50 mg by mouth every 12 (twelve) hours as needed for moderate pain.   Yes Historical Provider, MD    Family History Family History  Problem Relation Age of Onset  . COPD Father     Social History Social History  Substance Use Topics  . Smoking status: Current Some Day Smoker    Packs/day: 0.25    Years: 44.00    Types: Cigarettes  . Smokeless tobacco: Never Used     Comment: 2 cigs a day  . Alcohol use No     Comment: no alcohol in 7 yrs      Allergies   Ativan [lorazepam]   Review of Systems Review of Systems  Constitutional: Positive for appetite change and fatigue. Negative for chills, diaphoresis and fever.  HENT: Negative for congestion, rhinorrhea and sneezing.   Eyes: Negative.   Respiratory: Positive for cough, shortness of breath and wheezing. Negative for chest  tightness.   Cardiovascular: Positive for chest pain. Negative for leg swelling.  Gastrointestinal: Negative for abdominal pain, blood in stool, diarrhea, nausea and vomiting.  Genitourinary: Negative for difficulty urinating, flank pain, frequency and hematuria.  Musculoskeletal: Positive for back pain. Negative for arthralgias.  Skin: Negative for rash.  Neurological: Negative for dizziness, speech difficulty, weakness, numbness and headaches.     Physical Exam Updated Vital Signs BP (!) 118/107   Pulse 107   Temp 98.1 F (36.7 C) (Oral)   Resp 22   Ht '5\' 4"'$  (1.626 m)   Wt 195 lb (88.5 kg)   SpO2 94%   BMI 33.47 kg/m   Physical Exam  Constitutional: She is oriented to person, place, and time. She appears well-developed and well-nourished. She appears distressed.  HENT:  Head: Normocephalic  and atraumatic.  Eyes: Pupils are equal, round, and reactive to light.  Neck: Normal range of motion. Neck supple.  Cardiovascular: Normal rate, regular rhythm and normal heart sounds.   Pulmonary/Chest: Accessory muscle usage present. Tachypnea noted. She is in respiratory distress. She has wheezes. She has no rales. She exhibits no tenderness.  Abdominal: Soft. Bowel sounds are normal. There is no tenderness. There is no rebound and no guarding.  Musculoskeletal: Normal range of motion. She exhibits no edema.  Lymphadenopathy:    She has no cervical adenopathy.  Neurological: She is alert and oriented to person, place, and time.  Skin: Skin is warm and dry. No rash noted.  Psychiatric: She has a normal mood and affect.     ED Treatments / Results  Labs (all labs ordered are listed, but only abnormal results are displayed) Labs Reviewed  BASIC METABOLIC PANEL - Abnormal; Notable for the following:       Result Value   Glucose, Bld 167 (*)    All other components within normal limits  CBC WITH DIFFERENTIAL/PLATELET - Abnormal; Notable for the following:    Monocytes Absolute 1.1  (*)    All other components within normal limits  I-STAT TROPOININ, ED    EKG  EKG Interpretation  Date/Time:  Wednesday February 27 2016 20:33:46 EDT Ventricular Rate:  122 PR Interval:    QRS Duration: 93 QT Interval:  305 QTC Calculation: 435 R Axis:   97 Text Interpretation:  Sinus tachycardia Consider right atrial enlargement Probable inferior infarct, old since last tracing no significant change Confirmed by Rhemi Balbach  MD, Alaiyah Bollman ([id number]) on 02/27/2016 10:10:27 PM       Radiology Dg Chest Port 1 View  Result Date: 02/27/2016 CLINICAL DATA:  Shortness of breath for 3 days.  Wheezing. EXAM: PORTABLE CHEST 1 VIEW COMPARISON:  Most recent chest radiograph 02/06/2016. Chest CT 01/21/2016 FINDINGS: Tip of the right chest port in the mid SVC. Cardiomediastinal contours are normal, there is atherosclerosis of the aortic arch. The lungs are hyperinflated. Left upper lobe mass again seen. Spiculated right upper lobe nodule only faintly visualized. No confluent airspace disease, evidence of pleural effusion or pneumothorax. No pulmonary edema. Grossly stable osseous structures. IMPRESSION: 1. No acute process. 2. Aortic atherosclerosis. 3. Left upper lobe mass is unchanged. Spiculated right upper lobe nodule was not as well visualized Electronically Signed   By: Jeb Levering M.D.   On: 02/27/2016 21:00    Procedures Procedures (including critical care time)  Medications Ordered in ED Medications  albuterol (PROVENTIL) (2.5 MG/3ML) 0.083% nebulizer solution 5 mg (5 mg Nebulization Given 02/27/16 2301)     Initial Impression / Assessment and Plan / ED Course  I have reviewed the triage vital signs and the nursing notes.  Pertinent labs & imaging results that were available during my care of the patient were reviewed by me and considered in my medical decision making (see chart for details).  Clinical Course    Patient presents with 2 day history of worsening shortness of breath. She has  diffuse wheezing and tachypnea on exam. She's had 3 nebulizer treatments in the ED and has markedly improved but still has hypoxia into the mid 80s on room air. Her chest x-ray doesn't show evidence of pneumonia. Her symptoms are most consistent with a COPD exacerbation. I will consult the hospitalist for admission.  I spoke with Dr. Myna Hidalgo who will admit the pt  CRITICAL CARE Performed by: Alonah Lineback Total critical care time:  45 minutes Critical care time was exclusive of separately billable procedures and treating other patients. Critical care was necessary to treat or prevent imminent or life-threatening deterioration. Critical care was time spent personally by me on the following activities: development of treatment plan with patient and/or surrogate as well as nursing, discussions with consultants, evaluation of patient's response to treatment, examination of patient, obtaining history from patient or surrogate, ordering and performing treatments and interventions, ordering and review of laboratory studies, ordering and review of radiographic studies, pulse oximetry and re-evaluation of patient's condition.   Final Clinical Impressions(s) / ED Diagnoses   Final diagnoses:  COPD exacerbation [facility])    New Prescriptions New Prescriptions   No medications on file     Malvin Johns, MD 02/27/16 2318

## 2016-02-27 NOTE — ED Triage Notes (Signed)
Per EMS pt reported shortness of breath that has become worse over the last several days with no improvement from home medications/treatments. EMS administered 1 Albuterol and 1 DuoNeb treatment along with 18ga to left AC and administation of '125mg'$  Solu-Medrol. Pt reports being on room air at home.

## 2016-02-27 NOTE — ED Notes (Signed)
Dr.Belfi notified of pt still having expiratory wheezing but resolution of inspiratory wheezing. Order for Albuterol '5mg'$  treatment given.

## 2016-02-27 NOTE — ED Notes (Signed)
Pt reports improvement in breathing after Albuterol treatment.

## 2016-02-28 ENCOUNTER — Encounter (HOSPITAL_COMMUNITY): Payer: Self-pay | Admitting: Family Medicine

## 2016-02-28 ENCOUNTER — Ambulatory Visit: Payer: Medicaid - Out of State | Admitting: Pulmonary Disease

## 2016-02-28 DIAGNOSIS — C3492 Malignant neoplasm of unspecified part of left bronchus or lung: Secondary | ICD-10-CM

## 2016-02-28 DIAGNOSIS — F329 Major depressive disorder, single episode, unspecified: Secondary | ICD-10-CM

## 2016-02-28 DIAGNOSIS — J9601 Acute respiratory failure with hypoxia: Secondary | ICD-10-CM

## 2016-02-28 DIAGNOSIS — Z86711 Personal history of pulmonary embolism: Secondary | ICD-10-CM

## 2016-02-28 DIAGNOSIS — J441 Chronic obstructive pulmonary disease with (acute) exacerbation: Secondary | ICD-10-CM | POA: Diagnosis present

## 2016-02-28 DIAGNOSIS — K219 Gastro-esophageal reflux disease without esophagitis: Secondary | ICD-10-CM

## 2016-02-28 DIAGNOSIS — M8448XA Pathological fracture, other site, initial encounter for fracture: Secondary | ICD-10-CM | POA: Diagnosis present

## 2016-02-28 MED ORDER — LIDOCAINE-PRILOCAINE 2.5-2.5 % EX CREA: 1.0000 "application " | TOPICAL_CREAM | CUTANEOUS | Status: DC | PRN

## 2016-02-28 MED ORDER — RIVAROXABAN 20 MG PO TABS
20.0000 mg | ORAL_TABLET | Freq: Every day | ORAL | Status: DC
  Administered 2016-02-28 – 2016-03-03 (×6): 20 mg via ORAL
  Filled 2016-02-28 (×6): qty 1

## 2016-02-28 MED ORDER — SODIUM CHLORIDE 0.9% FLUSH: 3.0000 mL | INTRAVENOUS | Status: DC | PRN

## 2016-02-28 MED ORDER — ZOLPIDEM TARTRATE 5 MG PO TABS
5.0000 mg | ORAL_TABLET | Freq: Once | ORAL | Status: AC
Start: 2016-02-28 — End: 2016-02-28
  Administered 2016-02-28: 5 mg via ORAL
  Filled 2016-02-28: qty 1

## 2016-02-28 MED ORDER — BISACODYL 5 MG PO TBEC: 5.0000 mg | DELAYED_RELEASE_TABLET | Freq: Every day | ORAL | Status: DC | PRN

## 2016-02-28 MED ORDER — HYDROCODONE-ACETAMINOPHEN 5-325 MG PO TABS
1.0000 | ORAL_TABLET | ORAL | Status: DC | PRN
  Administered 2016-02-28: 1 via ORAL
  Administered 2016-02-28 – 2016-03-03 (×7): 2 via ORAL
  Filled 2016-02-28: qty 1
  Filled 2016-02-28 (×7): qty 2

## 2016-02-28 MED ORDER — CETYLPYRIDINIUM CHLORIDE 0.05 % MT LIQD
7.0000 mL | Freq: Two times a day (BID) | OROMUCOSAL | Status: DC
  Administered 2016-02-28 – 2016-03-04 (×9): 7 mL via OROMUCOSAL

## 2016-02-28 MED ORDER — RISAQUAD PO CAPS
1.0000 | ORAL_CAPSULE | Freq: Every day | ORAL | Status: DC
  Administered 2016-02-28 – 2016-03-04 (×6): 1 via ORAL
  Filled 2016-02-28 (×6): qty 1

## 2016-02-28 MED ORDER — PROCHLORPERAZINE MALEATE 10 MG PO TABS
10.0000 mg | ORAL_TABLET | Freq: Four times a day (QID) | ORAL | Status: DC | PRN
Start: 2016-02-28 — End: 2016-03-04

## 2016-02-28 MED ORDER — ALBUTEROL SULFATE (2.5 MG/3ML) 0.083% IN NEBU: 2.5000 mg | INHALATION_SOLUTION | RESPIRATORY_TRACT | Status: DC | PRN

## 2016-02-28 MED ORDER — ACETAMINOPHEN 325 MG PO TABS: 650.0000 mg | ORAL_TABLET | Freq: Four times a day (QID) | ORAL | Status: DC | PRN

## 2016-02-28 MED ORDER — FLUTICASONE FUROATE-VILANTEROL 100-25 MCG/INH IN AEPB
1.0000 | INHALATION_SPRAY | Freq: Every day | RESPIRATORY_TRACT | Status: DC
  Administered 2016-02-28 – 2016-03-03 (×6): 1 via RESPIRATORY_TRACT
  Filled 2016-02-28: qty 28

## 2016-02-28 MED ORDER — IPRATROPIUM-ALBUTEROL 0.5-2.5 (3) MG/3ML IN SOLN: 3.0000 mL | Freq: Three times a day (TID) | RESPIRATORY_TRACT | Status: DC

## 2016-02-28 MED ORDER — SODIUM CHLORIDE 0.9 % IV SOLN: 250.0000 mL | INTRAVENOUS | Status: DC | PRN

## 2016-02-28 MED ORDER — NICOTINE 21 MG/24HR TD PT24
21.0000 mg | MEDICATED_PATCH | Freq: Every day | TRANSDERMAL | Status: DC
  Administered 2016-02-28 – 2016-03-04 (×6): 21 mg via TRANSDERMAL
  Filled 2016-02-28 (×6): qty 1

## 2016-02-28 MED ORDER — PREMIER PROTEIN SHAKE
11.0000 [oz_av] | ORAL | Status: DC
  Administered 2016-02-28 – 2016-03-03 (×5): 11 [oz_av] via ORAL
  Filled 2016-02-28 (×5): qty 325.31

## 2016-02-28 MED ORDER — POLYETHYLENE GLYCOL 3350 17 G PO PACK: 17.0000 g | PACK | Freq: Every day | ORAL | Status: DC | PRN

## 2016-02-28 MED ORDER — ACETAMINOPHEN 650 MG RE SUPP: 650.0000 mg | Freq: Four times a day (QID) | RECTAL | Status: DC | PRN

## 2016-02-28 MED ORDER — MIRTAZAPINE 30 MG PO TABS
30.0000 mg | ORAL_TABLET | Freq: Every evening | ORAL | Status: DC | PRN
  Administered 2016-02-29 – 2016-03-03 (×3): 30 mg via ORAL
  Filled 2016-02-28 (×3): qty 1

## 2016-02-28 MED ORDER — LEVOFLOXACIN IN D5W 750 MG/150ML IV SOLN
750.0000 mg | Freq: Every day | INTRAVENOUS | Status: DC
  Administered 2016-02-28 – 2016-03-03 (×6): 750 mg via INTRAVENOUS
  Filled 2016-02-28 (×6): qty 150

## 2016-02-28 MED ORDER — IPRATROPIUM-ALBUTEROL 0.5-2.5 (3) MG/3ML IN SOLN
3.0000 mL | Freq: Three times a day (TID) | RESPIRATORY_TRACT | Status: DC
  Administered 2016-02-28 – 2016-03-04 (×16): 3 mL via RESPIRATORY_TRACT
  Filled 2016-02-28 (×16): qty 3

## 2016-02-28 MED ORDER — IPRATROPIUM-ALBUTEROL 0.5-2.5 (3) MG/3ML IN SOLN: 3.0000 mL | RESPIRATORY_TRACT | Status: DC | PRN

## 2016-02-28 MED ORDER — ALUM & MAG HYDROXIDE-SIMETH 200-200-20 MG/5ML PO SUSP
10.0000 mL | ORAL | Status: DC | PRN
  Administered 2016-02-28 – 2016-03-01 (×2): 10 mL via ORAL
  Filled 2016-02-28 (×2): qty 30

## 2016-02-28 MED ORDER — ONDANSETRON HCL 4 MG PO TABS: 4.0000 mg | ORAL_TABLET | Freq: Four times a day (QID) | ORAL | Status: DC | PRN

## 2016-02-28 MED ORDER — ONDANSETRON HCL 4 MG/2ML IJ SOLN: 4.0000 mg | Freq: Four times a day (QID) | INTRAMUSCULAR | Status: DC | PRN

## 2016-02-28 MED ORDER — SODIUM CHLORIDE 0.9% FLUSH
3.0000 mL | Freq: Two times a day (BID) | INTRAVENOUS | Status: DC
  Administered 2016-02-28 – 2016-03-03 (×10): 3 mL via INTRAVENOUS

## 2016-02-28 MED ORDER — METHYLPREDNISOLONE SODIUM SUCC 125 MG IJ SOLR
60.0000 mg | Freq: Four times a day (QID) | INTRAMUSCULAR | Status: DC
  Administered 2016-02-28 – 2016-03-01 (×10): 60 mg via INTRAVENOUS
  Filled 2016-02-28 (×10): qty 2

## 2016-02-28 MED ORDER — FAMOTIDINE 20 MG PO TABS
20.0000 mg | ORAL_TABLET | Freq: Two times a day (BID) | ORAL | Status: DC
  Administered 2016-02-28 – 2016-03-04 (×12): 20 mg via ORAL
  Filled 2016-02-28 (×12): qty 1

## 2016-02-28 MED ORDER — DIPHENHYDRAMINE HCL 25 MG PO CAPS: 25.0000 mg | ORAL_CAPSULE | Freq: Four times a day (QID) | ORAL | Status: DC | PRN

## 2016-02-28 NOTE — H&P (Addendum)
History and Physical    Heather Banks ZSW:[id number] DOB: Nov 01, 1950 DOA: 02/27/2016  PCP: [name], NP   Patient coming from: Home   Chief Complaint: Dyspnea  HPI: Heather Banks is a 65 y.o. female with medical history significant for stage IV lung cancer status post chemotherapy and palliative radiation, PE on Xarelto, COPD, and depression who presents the emergency department with several days of worsening dyspnea. Patient was admitted to this institution in late June 2017 and treated for an acute exacerbation in her COPD. She was discharged home with a long prednisone taper which she has since completed. She initially did well back at home, but over the past several days has developed progressively worsening dyspnea with minimal exertion. Patient has a cough productive of thin clear sputum and reports some "hot flashes," but denies fevers or chills. There has been no lower extremity edema, orthopnea, chest pain, or palpitations during this illness. Patient denies recent long distance travel, sick contacts, or recent changes in her medications. She has been using her albuterol nebulizer and MDI with increasing frequency but diminishing results. Tonight, she activated EMS for transport to the hospital. Nebulized breathing treatments and 125 mg IV Solu-Medrol were administered en route. She does not use supplemental oxygen at home.  ED Course: Upon arrival to the ED, patient is found to be afebrile, saturating 82% on room air, tachycardic in the low 100s, and with vitals otherwise stable. EKG demonstrates sinus tachycardia with rate 122 and chest x-ray is negative for acute cardiopulmonary disease. Chemistry panel features a modestly elevated serum glucose and CBC is unremarkable. Troponin is undetectable. Patient was placed on supplemental oxygen via nasal cannula with good recovery and the O2 sat duration. Albuterol was administered via nebulizer in the emergency department and the patient enjoyed  some improvement with these measures, but continued to require supplemental oxygen to maintain a saturation in the 90s while at rest. Patient remained hemodynamically stable in the emergency department and will be observed on the medical/surgical unit for ongoing evaluation and management of acute hypoxic respiratory failure suspected secondary to acute exacerbation in COPD.  Review of Systems:  All other systems reviewed and apart from HPI, are negative.  Past Medical History:  Diagnosis Date  . Adenocarcinoma of left lung, stage 4 ([place]) 07/09/2014  . COPD (chronic obstructive pulmonary disease) (HCC)    Albuterol neb as needed;Pulmicort neb daily  . Encounter for antineoplastic chemotherapy 01/21/2015  . Full code status 05/01/2015  . GERD (gastroesophageal reflux disease)    takes Pantoprazole and Zantac daily  . History of bronchitis    >5 yrs ago  . History of migraine    last one 2 wks ago  . Hx of radiation therapy 10/29/2015 to 11/16/2015   The Left upper lobe was treated to 37.5 Gy in 15 fractions at 2.5 Gy per fraction  . Lung mass   . Panic attacks    but doesn't take any meds  . Pneumonia    hx of-last time about 4+yrs ago    Past Surgical History:  Procedure Laterality Date  . ABDOMINAL HYSTERECTOMY    . APPENDECTOMY    . OOPHORECTOMY    . PORTACATH PLACEMENT  jan. 2016  . TONSILLECTOMY    . VIDEO BRONCHOSCOPY WITH ENDOBRONCHIAL NAVIGATION N/A 08/14/2014   Procedure: VIDEO BRONCHOSCOPY WITH ENDOBRONCHIAL NAVIGATION;  Surgeon: Rigoberto Noel, MD;  Location: [facility];  Service: Thoracic;  Laterality: N/A;     reports that she has been smoking Cigarettes.  She has a 11.00 pack-year smoking history. She has never used smokeless tobacco. She reports that she does not drink alcohol or use drugs.  Allergies  Allergen Reactions  . Ativan [Lorazepam] Other (See Comments)    Reaction:  Makes pt angry     Family History  Problem Relation Age of Onset  . COPD Father       Prior to Admission medications   Medication Sig Start Date End Date Taking? Authorizing Provider  acidophilus (RISAQUAD) CAPS capsule Take 1 capsule by mouth daily.   Yes Historical Provider, MD  albuterol (PROVENTIL HFA;VENTOLIN HFA) 108 (90 BASE) MCG/ACT inhaler Inhale 2 puffs into the lungs every 6 (six) hours as needed for wheezing or shortness of breath. 06/15/15  Yes Rigoberto Noel, MD  albuterol (PROVENTIL) (2.5 MG/3ML) 0.083% nebulizer solution Take 3 mLs (2.5 mg total) by nebulization every 6 (six) hours as needed for wheezing or shortness of breath. 06/13/15  Yes Rigoberto Noel, MD  aspirin-acetaminophen-caffeine (EXCEDRIN MIGRAINE) [phone] MG per tablet Take 2 tablets by mouth every 6 (six) hours as needed for headache.   Yes Historical Provider, MD  diphenhydrAMINE (BENADRYL) 25 mg capsule Take 25 mg by mouth every 6 (six) hours as needed for itching. Reported on 01/19/2016   Yes Historical Provider, MD  fluticasone furoate-vilanterol (BREO ELLIPTA) 100-25 MCG/INH AEPB Inhale 1 puff into the lungs at bedtime.   Yes Historical Provider, MD  hydrocortisone 1 % ointment Apply 1 application topically 2 (two) times daily. 01/08/16  Yes Curt Bears, MD  lidocaine-prilocaine (EMLA) cream Apply 1 application topically as needed (prior to accessing port).   Yes Historical Provider, MD  mirtazapine (REMERON) 30 MG tablet Take 30 mg by mouth at bedtime as needed (for sleep).   Yes Historical Provider, MD  nicotine (NICODERM CQ) 21 mg/24hr patch Place 1 patch (21 mg total) onto the skin daily. 08/15/14  Yes Deneise Lever, MD  prochlorperazine (COMPAZINE) 10 MG tablet Take 1 tablet (10 mg total) by mouth every 6 (six) hours as needed for nausea or vomiting. 01/08/16  Yes Curt Bears, MD  ranitidine (ZANTAC) 150 MG tablet Take 150 mg by mouth 2 (two) times daily.    Yes Historical Provider, MD  rivaroxaban (XARELTO) 20 MG TABS tablet Take 1 tablet (20 mg total) by mouth daily with supper.  02/21/16  Yes Erick Colace, NP  traMADol (ULTRAM) 50 MG tablet Take 50 mg by mouth every 12 (twelve) hours as needed for moderate pain.   Yes Historical Provider, MD    Physical Exam: Vitals:   02/27/16 2300 02/27/16 2301 02/27/16 2330 02/27/16 2359  BP: 123/85  132/85 (!) 127/92  Pulse: 114  112 (!) 114  Resp: 21  19 (!) 24  Temp:    98.6 F (37 C)  TempSrc:    Oral  SpO2: 94% 94% 93% 93%  Weight:    87.7 kg (193 lb 5.5 oz)  Height:    '5\' 5"'$  (1.651 m)      Constitutional: Calm, but with labored respirations Eyes: PERTLA, lids and conjunctivae normal ENMT: Mucous membranes are moist. Posterior pharynx clear of any exudate or lesions.   Neck: normal, supple, no masses, no thyromegaly Respiratory: Diminished throughout with expiratory wheezes on right. Accessory muscle recruitment, no cyanosis or pallor.  Cardiovascular: S1 & S2 heard, regular rate and rhythm. No carotid bruits. No significant JVD. Abdomen: No distension, no tenderness, no masses palpated. Bowel sounds normal.  Musculoskeletal: no clubbing / cyanosis.  No joint deformity upper and lower extremities. Normal muscle tone.  Skin: no significant rashes, lesions, ulcers. Warm, dry, well-perfused. Neurologic: CN 2-12 grossly intact. Sensation intact, DTR normal. Strength 5/5 in all 4 limbs.  Psychiatric: Normal judgment and insight. Alert and oriented x 3. Normal mood and affect.     Labs on Admission: I have personally reviewed following labs and imaging studies  CBC:  Recent Labs Lab 02/27/16 2043  WBC 8.9  NEUTROABS 5.4  HGB 13.5  HCT 40.6  MCV 94.0  PLT 967   Basic Metabolic Panel:  Recent Labs Lab 02/26/16 0917 02/27/16 2043  NA 140 138  K 4.5 3.8  CL 104 105  CO2 23 25  GLUCOSE 108* 167*  BUN 9 14  CREATININE 0.58 0.52  CALCIUM 9.7 9.2   GFR: Estimated Creatinine Clearance: 79.8 mL/min (by C-G formula based on SCr of 0.8 mg/dL). Liver Function Tests:  Recent Labs Lab 02/26/16 0917   AST 16  ALT 12  ALKPHOS 139*  BILITOT 0.3  PROT 7.2  ALBUMIN 4.3   No results for input(s): LIPASE, AMYLASE in the last 168 hours. No results for input(s): AMMONIA in the last 168 hours. Coagulation Profile: No results for input(s): INR, PROTIME in the last 168 hours. Cardiac Enzymes: No results for input(s): CKTOTAL, CKMB, CKMBINDEX, TROPONINI in the last 168 hours. BNP (last 3 results) No results for input(s): PROBNP in the last 8760 hours. HbA1C: No results for input(s): HGBA1C in the last 72 hours. CBG: No results for input(s): GLUCAP in the last 168 hours. Lipid Profile:  Recent Labs  02/26/16 0917  CHOL 261*  HDL 52  LDLCALC 172*  TRIG 185*  CHOLHDL 5.0   Thyroid Function Tests: No results for input(s): TSH, T4TOTAL, FREET4, T3FREE, THYROIDAB in the last 72 hours. Anemia Panel: No results for input(s): VITAMINB12, FOLATE, FERRITIN, TIBC, IRON, RETICCTPCT in the last 72 hours. Urine analysis:    Component Value Date/Time   COLORURINE YELLOW 01/19/2016 0900   APPEARANCEUR CLEAR 01/19/2016 0900   LABSPEC 1.040 (H) 01/19/2016 0900   PHURINE 5.5 01/19/2016 0900   GLUCOSEU NEGATIVE 01/19/2016 0900   HGBUR TRACE (A) 01/19/2016 0900   BILIRUBINUR NEGATIVE 01/19/2016 0900   KETONESUR 15 (A) 01/19/2016 0900   PROTEINUR 100 (A) 01/19/2016 0900   NITRITE NEGATIVE 01/19/2016 0900   LEUKOCYTESUR NEGATIVE 01/19/2016 0900   Sepsis Labs: '@LABRCNTIP'$ (procalcitonin:4,lacticidven:4) )No results found for this or any previous visit (from the past 240 hour(s)).   Radiological Exams on Admission: Dg Chest Port 1 View  Result Date: 02/27/2016 CLINICAL DATA:  Shortness of breath for 3 days.  Wheezing. EXAM: PORTABLE CHEST 1 VIEW COMPARISON:  Most recent chest radiograph 02/06/2016. Chest CT 01/21/2016 FINDINGS: Tip of the right chest port in the mid SVC. Cardiomediastinal contours are normal, there is atherosclerosis of the aortic arch. The lungs are hyperinflated. Left upper  lobe mass again seen. Spiculated right upper lobe nodule only faintly visualized. No confluent airspace disease, evidence of pleural effusion or pneumothorax. No pulmonary edema. Grossly stable osseous structures. IMPRESSION: 1. No acute process. 2. Aortic atherosclerosis. 3. Left upper lobe mass is unchanged. Spiculated right upper lobe nodule was not as well visualized Electronically Signed   By: Jeb Levering M.D.   On: 02/27/2016 21:00    EKG: Independently reviewed. Sinus tachycardia, rate 122   Assessment/Plan  1. COPD with acute exacerbation  - There has been marked increase in her baseline dyspnea and cough; sputum is increased but not  purulent  - She has been unable to achieve relief with neb treatments at home  - Given 125 mg Solu-Medrol and a DuoNeb en route with EMS  - Continue Solu-Medrol 60 mg IV q6h and DuoNeb  - Start Levaquin given severe exacerbation requiring hospitalization  - Continue scheduled Breo Ellipta -   2. Acute hypoxic respiratory failure  - Saturating 82% on rm air in ED; no supplemental O2 requirement at baseline  - CXR negative for acute cardiopulmonary disease  - Likely secondary to acute exacerbation in COPD  - Saturations improved to 90s with 4 Lpm via [place]  - Treat COPD exacerbation as above  - Monitor with continuous pulse oximetry and titrate FiO2 to maintain sat >92%    3. Non-small cell lung cancer  - Stage IV adenocarcinoma diagnosed 2 yrs ago  - Followed by oncology and under treatment with chemotherapy (carboplatin, s/p 6 cycles); immunotherapy stopped d/t rash; completed palliative radiation in April 2017  - Ongoing management per oncology    4. Hx of PE    - Small acute PE in left lateral basal segment noted on CT 01/01/16 with CT evidence for right-heart strain - CTA PE study from 01/21/16 was neg for acute PE and demonstrates a stable appearance to small PE in left lateral base - Pt reports strict compliance with Xarelto, will continue     5. Depression  - Stable  - Continue current management with Remeron   6. GERD - Stable - Taking Zantac 150 mg BID at home, will continue H2-blocker with Pepcid BID while in hospital    DVT prophylaxis: Continue Xarelto  Code Status: Partial -- No intubation Family Communication: Discussed with patient  Disposition Plan: Observe on med-surg  Consults called: None Admission status: Observation    Vianne Bulls, MD [facility] Pager [phone]  If 7PM-7AM, please contact night-coverage [email or web address] Password [email or web address]  02/28/2016, 12:45 AM

## 2016-02-28 NOTE — Progress Notes (Signed)
Initial Nutrition Assessment  DOCUMENTATION CODES:   Obesity unspecified  INTERVENTION:   Provide Premier Protein supplements Q24hrs, each provides 160 kcal and 30 g protein. Provided strategies to aid in taste alterations. RD to continue to monitor  NUTRITION DIAGNOSIS:   Inadequate oral intake related to poor appetite (taste alterations) as evidenced by per patient/family report.  GOAL:   Patient will meet greater than or equal to 90% of their needs  MONITOR:   PO intake, Supplement acceptance, Labs, Weight trends, I & O's  REASON FOR ASSESSMENT:   Consult COPD Protocol  ASSESSMENT:    65 y.o. female with medical history significant for stage IV lung cancer status post chemotherapy and palliative radiation, PE on Xarelto, COPD, and depression who presents the emergency department with several days of worsening dyspnea. Patient was admitted to this institution in late June 2017 and treated for an acute exacerbation in her COPD. She was discharged home with a long prednisone taper which she has since completed. She initially did well back at home, but over the past several days has developed progressively worsening dyspnea with minimal exertion. Patient has a cough productive of thin clear sputum and reports some "hot flashes," but denies fevers or chills.   Patient currently consuming 50% of meals. Pt states that she has a great appetite when she is taking steroids but once she is off of them her appetite is very poor. She denies swallowing or chewing issues but states she does have taste changes where foods have bitter tastes. She also reports a sensitivity to salty foods that she did not have prior to cancer treatments. Pt states she eats very little meat and mostly fruits and tomato sandwiches and fresh corn. Pt does not like Ensure and Boost supplements. She willing to try [place] for me today. Will once daily.  Pt reports UBW of 167 lb prior to cancer diagnosis. She  reports during treatments her weight ranged from 196-202 lb.  Nutrition focused physical exam shows no sign of depletion of muscle mass or body fat.  Labs reviewed. Medications reviewed.  Diet Order:  Diet regular Room service appropriate? Yes; Fluid consistency: Thin  Skin:  Reviewed, no issues  Last BM:  8/1  Height:   Ht Readings from Last 1 Encounters:  02/27/16 '5\' 5"'$  (1.651 m)    Weight:   Wt Readings from Last 1 Encounters:  02/27/16 193 lb 5.5 oz (87.7 kg)    Ideal Body Weight:  56.8 kg  BMI:  Body mass index is 32.17 kg/m.  Estimated Nutritional Needs:   Kcal:  1950-2150  Protein:  85-95g  Fluid:  2L/day  EDUCATION NEEDS:   Education needs addressed  Clayton Bibles, MS, RD, LDN Pager: [phone] After Hours Pager: [phone]

## 2016-02-28 NOTE — Evaluation (Signed)
Physical Therapy Evaluation Patient Details Name: Heather Banks MRN: [id number] DOB: Feb 27, 1951 Today's Date: 02/28/2016   History of Present Illness  Heather Banks 65 yo admit with h/o significant for stage IV lung cancer s/p chemo and palliative radiation, h/o PE on arelto, COPD, and depression. Pt with recent worsening of dyspnea with minimal exertion. In June hopsitalized and treated for acute exacerbation of her COPD and returned home doing fairly well until recently. IN ED found to have O2 sat on room air 82% abd tach 122 rate.   Clinical Impression  Pt with COPD exacerbation and IV lung cancer presents with limiting dyspnea with minimal activity. Pt on 4 L O2  at 90% at edge of bed, however limited with talked due to extent of dyspnea, needed about 5 minutes for recovery at [facility] from supine. Then walked on RA 10 feet in room and dropped to 80-82%, immediately replaced 02 on 4 L and recovered to 89% within 1 minute. Educated patient with minimal LE and UE exercises to help exercise lung activity and perfusion without having to walk as well. Educated patient with need for O2 at this time and she is aware. Will continue to follow to increase safety with mobility for her discharge.     Follow Up Recommendations No PT follow up    Equipment Recommendations  None recommended by PT    Recommendations for Other Services       Precautions / Restrictions Precautions Precaution Comments: watch O2       Mobility  Bed Mobility Overal bed mobility: Modified Independent                Transfers Overall transfer level: Modified independent Equipment used: 1 person hand held assist                Ambulation/Gait Ambulation/Gait assistance: Min guard Ambulation Distance (Feet): 10 Feet Assistive device: 1 person hand held assist   Gait velocity: slow   General Gait Details: held to pateint to steady due to shortness of breathe . Walked in room to the door and back.   Stairs                     Pertinent Vitals/Pain Pain Assessment: No/denies pain    Home Living Family/patient expects to be discharged to:: Private residence Living Arrangements: Other relatives (pt states she has been living with her mom since she has been sick ) Available Help at Discharge: Family Type of Home: House Home Access: Level entry     Home Layout: One level Home Equipment: None Additional Comments: pt states she wasnt sto use the least amount of equipment as possible in ordr to stay as strong as possible. I encouraged he to think smart as well and be safe when teh time comes to need any equipment.     Prior Function Level of Independence: Independent         Comments: Pt states until this occurance she has been able to do all for her self as much as possible.         Extremity/Trunk Assessment               Lower Extremity Assessment: Overall WFL for tasks assessed         Communication   Communication: No difficulties (except challenged to talk due to shortnesss of breathe)  Cognition Arousal/Alertness: Awake/alert Behavior During Therapy: WFL for tasks assessed/performed Overall Cognitive Status: Within Functional Limits for tasks assessed  General Comments      Exercises Other Exercises Other Exercises: educated patient with seated LAQ exerciese (3 each B LEs) to enhance perfusion as much as possible and BUE exercises 2-5x to exercises lungs a little.       Assessment/Plan    PT Assessment Patient needs continued PT services  PT Diagnosis Generalized weakness   PT Problem List Decreased strength;Decreased activity tolerance  PT Treatment Interventions Functional mobility training;Therapeutic exercise;Therapeutic activities;Patient/family education   PT Goals (Current goals can be found in the Care Plan section) Acute Rehab PT Goals Patient Stated Goal: I want to be able to get my breathing better . I like to  be independent PT Goal Formulation: With patient Time For Goal Achievement: 03/13/16 Potential to Achieve Goals: Good    Frequency Min 3X/week   Barriers to discharge           End of Session   Activity Tolerance: Patient tolerated treatment well (limited by shortness of breathe) Patient left: in bed (Pt sitting edge of bed with nurse in room ) Nurse Communication: Mobility status (educated NT if she takes patient to bathroom to get an O2 extension for she will need O2 . )    Functional Assessment Tool Used: clinical judgement  Functional Limitation: Mobility: Walking and moving around Mobility: Walking and Moving Around Current Status (P1898): At least 1 percent but less than 20 percent impaired, limited or restricted Mobility: Walking and Moving Around Goal Status [phone]): 0 percent impaired, limited or restricted    Time: 1300-1325 PT Time Calculation (min) (ACUTE ONLY): 25 min   Charges:   PT Evaluation $PT Eval Low Complexity: 1 Procedure PT Treatments $Therapeutic Activity: 8-22 mins   PT G Codes:   PT G-Codes **NOT FOR INPATIENT CLASS** Functional Assessment Tool Used: clinical judgement  Functional Limitation: Mobility: Walking and moving around Mobility: Walking and Moving Around Current Status (X2811): At least 1 percent but less than 20 percent impaired, limited or restricted Mobility: Walking and Moving Around Goal Status [phone]): 0 percent impaired, limited or restricted    Heather Banks 02/28/2016, 1:51 PM  Heather Banks, PT Pager: [phone] 02/28/2016

## 2016-02-28 NOTE — Progress Notes (Signed)
OT Cancellation Note  Patient Details Name: Heather Banks MRN: [id number] DOB: [birth year]   Cancelled Treatment:    Reason Eval/Treat Not Completed: Other (comment).  Pt sleeping soundly; noted she was limited with PT due to dyspnea.  Will return tomorrow for OT evaluation  Kimyata Milich 02/28/2016, 3:40 PM\ Lesle Chris, OTR/L [phone] 02/28/2016

## 2016-02-28 NOTE — Progress Notes (Signed)
CM consult for COPD gold. Pt does not qualify for COPD gold as has only been admitted to hospital x2 in past 6 months. CM will continue to follow for DC needs. Marney Doctor RN,BSN,NCM [phone]

## 2016-02-28 NOTE — Progress Notes (Signed)
Patient seen and examined at bedside. Patient was admitted after midnight, please see earlier admission note by Dr. Myna Hidalgo. Patient admitted for evaluation of persistently worsening dyspnea several days in duration associated with wheezing and mixed episodes of nonproductive nonproductive cough clear sputum, subjective fevers and chills. Patient was admitted for management of acute on chronic COPD exacerbation. She was started on Solu-Medrol 60 mg IV every 6 with bronchodilators scheduled and as needed. Patient also started on empiric Levaquin given severe exacerbation. We will continue same regimen for now with no plans of tapering Solu-Medrol S patient persistently wheezing and coarse breath sounds. Close monitoring. Vital signs and blood work reviewed. Repeat CBC and BMP in the morning.  Faye Ramsay, MD  [facility] Pager [phone]  If 7PM-7AM, please contact night-coverage [email or web address] Password [email or web address]

## 2016-02-28 NOTE — Progress Notes (Signed)
PT demonstrated verbal and hands on understanding of Flutter device. 

## 2016-02-28 NOTE — Progress Notes (Signed)
LCSWA was consulted to complete depression screening for COPD patient.  LCSWA met with patient at bedside and agreeable to screening. Patient scored 11 on scale, patient moderately depressed. Patient was tearful during assessment and apologetic about crying. 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 Patient did not endorse SI/HI.   LCSWA offered patient outpatient resources to see a therapist. Patient receptive to resources at this time.  Depression Screen placed on patient chart.   Kathrin Greathouse, Latanya Presser, MSW Clinical Social Worker 5E and Psychiatric Service Line [phone] 02/28/2016  2:46 PM

## 2016-02-29 LAB — BASIC METABOLIC PANEL
ANION GAP: 8 (ref 5–15)
BUN: 19 mg/dL (ref 6–20)
CALCIUM: 9.2 mg/dL (ref 8.9–10.3)
CO2: 28 mmol/L (ref 22–32)
Chloride: 102 mmol/L (ref 101–111)
Creatinine, Ser: 0.6 mg/dL (ref 0.44–1.00)
GFR calc Af Amer: >60 mL/min (ref >60)
Glucose, Bld: 147 mg/dL — ABNORMAL HIGH (ref 65–99)
POTASSIUM: 4.3 mmol/L (ref 3.5–5.1)
SODIUM: 138 mmol/L (ref 135–145)

## 2016-02-29 LAB — CBC
HCT: 40 % (ref 36.0–46.0)
Hemoglobin: 12.7 g/dL (ref 12.0–15.0)
MCH: 30.2 pg (ref 26.0–34.0)
MCHC: 31.8 g/dL (ref 30.0–36.0)
MCV: 95 fL (ref 78.0–100.0)
PLATELETS: 353 10*3/uL (ref 150–400)
RBC: 4.21 MIL/uL (ref 3.87–5.11)
RDW: 14.9 % (ref 11.5–15.5)
WBC: 16 10*3/uL — AB (ref 4.0–10.5)

## 2016-02-29 NOTE — Evaluation (Signed)
Occupational Therapy Evaluation Patient Details Name: Heather Banks MRN: [id number] DOB: 1950/10/31 Today's Date: 02/29/2016    History of Present Illness Heather Banks 65 yo admit with h/o significant for stage IV lung cancer s/p chemo and palliative radiation, h/o PE on arelto, COPD, and depression. Pt with recent worsening of dyspnea with minimal exertion. In June hopsitalized and treated for acute exacerbation of her COPD and returned home doing fairly well until recently. IN ED found to have O2 sat on room air 82% abd tach 122 rate.    Clinical Impression   Pt was admitted for the above. She will benefit from OT to further educate on energy conservation during adls.      Follow Up Recommendations  Supervision - Intermittent    Equipment Recommendations   (shower seat, if pt is agreeable)    Recommendations for Other Services       Precautions / Restrictions Precautions Precautions: None Restrictions Weight Bearing Restrictions: No      Mobility Bed Mobility Overal bed mobility: Independent                Transfers Overall transfer level: Independent                    Balance                                            ADL Overall ADL's : Needs assistance/impaired                         Toilet Transfer: Independent;Ambulation;Comfort height toilet   Toileting- Clothing Manipulation and Hygiene: Independent         General ADL Comments: pt can perform ADLs with set up, but she expends a lot of energy, due to bending over. Educated on energy conservation and gave her handouts.  Educated on AE for energy conservation and she states that she would be willing to use this to save energy. Will provide this to her later today.  She will check with her mother to see if her mother minds putting a shower seat in her walk in shower     Vision     Perception     Praxis      Pertinent Vitals/Pain Pain Assessment: 0-10 Pain  Score: 5  Pain Location: headache Pain Descriptors / Indicators: Aching Pain Intervention(s): Limited activity within patient's tolerance;Monitored during session;Repositioned     Hand Dominance     Extremity/Trunk Assessment Upper Extremity Assessment Upper Extremity Assessment: Overall WFL for tasks assessed           Communication Communication Communication: No difficulties   Cognition Arousal/Alertness: Awake/alert Behavior During Therapy: WFL for tasks assessed/performed Overall Cognitive Status: Within Functional Limits for tasks assessed                     General Comments       Exercises       Shoulder Instructions      Home Living Family/patient expects to be discharged to:: Private residence Living Arrangements: Other relatives Available Help at Discharge: Family               Bathroom Shower/Tub: Walk-in Corporate treasurer Toilet: Standard     Home Equipment: None          Prior Functioning/Environment Level of  Independence: Independent             OT Diagnosis: Generalized weakness   OT Problem List: Decreased activity tolerance;Pain;Decreased knowledge of use of DME or AE;Cardiopulmonary status limiting activity   OT Treatment/Interventions: Self-care/ADL training;Energy conservation;DME and/or AE instruction;Patient/family education    OT Goals(Current goals can be found in the care plan section) Acute Rehab OT Goals Patient Stated Goal: I want to be able to get my breathing better . I like to be independent OT Goal Formulation: With patient Time For Goal Achievement: 03/07/16 Potential to Achieve Goals: Good ADL Goals Additional ADL Goal #1: pt will gather clothes with reacher at mod I level Additional ADL Goal #2: pt will initiate rest break without cues during adl routine  OT Frequency: Min 2X/week   Barriers to D/C:            Co-evaluation              End of Session    Activity Tolerance: Patient  tolerated treatment well Patient left: in chair;with call bell/phone within reach   Time: 0807-0830 OT Time Calculation (min): 23 min Charges:  OT General Charges $OT Visit: 1 Procedure OT Evaluation $OT Eval Low Complexity: 1 Procedure G-Codes:    Heather Banks 02-Mar-2016, 8:48 AM  Lesle Chris, OTR/L [phone] 2016-03-02

## 2016-02-29 NOTE — Progress Notes (Signed)
Patient ID: Heather Banks, female   DOB: 1951-07-02, 65 y.o.   MRN: [id number]    PROGRESS NOTE   Heather Banks  LZJ:[id number] DOB: 1950/08/31 DOA: 02/27/2016  PCP: [name], NP   Brief Narrative:  65 y.o. female with stage IV lung cancer status post chemotherapy and palliative radiation, PE on Xarelto, COPD, and depression who presented to the emergency department with several days of worsening dyspnea. Patient was admitted to this institution in late June 2017 and treated for an acute exacerbation in her COPD. She was discharged home with a long prednisone taper which she has since completed. She initially did well back at home, but over the past several days has developed progressively worsening dyspnea with minimal exertion. Patient has a cough productive of thin clear sputum.  Assessment/Plan  1. COPD with acute exacerbation  - There has been marked increase in her baseline dyspnea and cough; sputum is increased but not purulent  - overall better but still with wheezing and dyspnea, unable to taper off oxygen  - continue solumedrol for now with no plans to taper down yet - continue empiric Levaquin and duonebs scheduled and as needed  - Continue scheduled Breo Ellipta -   2. Acute hypoxic respiratory failure secondary to acute on chronic COPD exacerbation  - still unable to take off oxygen  - CXR negative for acute cardiopulmonary disease  - Treat COPD exacerbation as above  - Monitor oxygen saturations   3. Non-small cell lung cancer  - Stage IV adenocarcinoma diagnosed 2 yrs ago  - Followed by oncology and under treatment with chemotherapy (carboplatin, s/p 6 cycles); immunotherapy stopped d/t rash; completed palliative radiation in April 2017  - Ongoing management per oncology    4. Hx of PE    - Small acute PE in left lateral basal segment noted on CT 01/01/16 with CT evidence for right-heart strain - CTA PE study from 01/21/16 was neg for acute PE and demonstrates a  stable appearance to small PE in left lateral base - continue Xarelto   5. Depression  - Stable  - Continue current management with Remeron   6. GERD - Stable   DVT prophylaxis: Xarelto  Code Status: Full  Family Communication: Patient at bedside  Disposition Plan: Home in 2-3 days when off IV steroids   Consultants:   None   Procedures:   None   Antimicrobials:   Levaquin    Subjective: Reports feeling better.   Objective: Vitals:   02/28/16 2047 02/28/16 2149 02/29/16 0524 02/29/16 0759  BP:  (!) 137/91 123/86   Pulse:  91 97   Resp:  (!) 22 (!) 22   Temp:  98 F (36.7 C) 97.8 F (36.6 C)   TempSrc:  Oral Oral   SpO2: 98% 91% 91% (!) 87%  Weight:      Height:        Intake/Output Summary (Last 24 hours) at 02/29/16 1050 Last data filed at 02/29/16 0526  Gross per 24 hour  Intake              350 ml  Output                0 ml  Net              350 ml   Filed Weights   02/27/16 2032 02/27/16 2359  Weight: 88.5 kg (195 lb) 87.7 kg (193 lb 5.5 oz)    Examination:  General exam: Appears calm  and comfortable  Respiratory system: Course breath sounds bilaterally with exp wheezing and diminished breath sounds at bases  Cardiovascular system: S1 & S2 heard, RRR. No JVD, murmurs, rubs, gallops or clicks. No pedal edema. Gastrointestinal system: Abdomen is nondistended, soft and nontender. No organomegaly Central nervous system: Alert and oriented. No focal neurological deficits. Extremities: Symmetric 5 x 5 power. Skin: No rashes, lesions or ulcers   Data Reviewed: I have personally reviewed following labs and imaging studies  CBC:  Recent Labs Lab 02/27/16 2043 02/29/16 0438  WBC 8.9 16.0*  NEUTROABS 5.4  --   HGB 13.5 12.7  HCT 40.6 40.0  MCV 94.0 95.0  PLT 375 888   Basic Metabolic Panel:  Recent Labs Lab 02/26/16 0917 02/27/16 2043 02/29/16 0438  NA 140 138 138  K 4.5 3.8 4.3  CL 104 105 102  CO2 '23 25 28  '$ GLUCOSE 108*  167* 147*  BUN '9 14 19  '$ CREATININE 0.58 0.52 0.60  CALCIUM 9.7 9.2 9.2   Liver Function Tests:  Recent Labs Lab 02/26/16 0917  AST 16  ALT 12  ALKPHOS 139*  BILITOT 0.3  PROT 7.2  ALBUMIN 4.3   Urine analysis:    Component Value Date/Time   COLORURINE YELLOW 01/19/2016 0900   APPEARANCEUR CLEAR 01/19/2016 0900   LABSPEC 1.040 (H) 01/19/2016 0900   PHURINE 5.5 01/19/2016 0900   GLUCOSEU NEGATIVE 01/19/2016 0900   HGBUR TRACE (A) 01/19/2016 0900   BILIRUBINUR NEGATIVE 01/19/2016 0900   KETONESUR 15 (A) 01/19/2016 0900   PROTEINUR 100 (A) 01/19/2016 0900   NITRITE NEGATIVE 01/19/2016 0900   LEUKOCYTESUR NEGATIVE 01/19/2016 0900    Radiology Studies: Dg Chest Port 1 View  Result Date: 02/27/2016 CLINICAL DATA:  Shortness of breath for 3 days.  Wheezing. EXAM: PORTABLE CHEST 1 VIEW COMPARISON:  Most recent chest radiograph 02/06/2016. Chest CT 01/21/2016 FINDINGS: Tip of the right chest port in the mid SVC. Cardiomediastinal contours are normal, there is atherosclerosis of the aortic arch. The lungs are hyperinflated. Left upper lobe mass again seen. Spiculated right upper lobe nodule only faintly visualized. No confluent airspace disease, evidence of pleural effusion or pneumothorax. No pulmonary edema. Grossly stable osseous structures. IMPRESSION: 1. No acute process. 2. Aortic atherosclerosis. 3. Left upper lobe mass is unchanged. Spiculated right upper lobe nodule was not as well visualized Electronically Signed   By: Jeb Levering M.D.   On: 02/27/2016 21:00      Scheduled Meds: . acidophilus  1 capsule Oral Daily  . antiseptic oral rinse  7 mL Mouth Rinse BID  . famotidine  20 mg Oral BID  . fluticasone furoate-vilanterol  1 puff Inhalation QHS  . ipratropium-albuterol  3 mL Nebulization TID  . levofloxacin (LEVAQUIN) IV  750 mg Intravenous QHS  . methylPREDNISolone (SOLU-MEDROL) injection  60 mg Intravenous Q6H  . nicotine  21 mg Transdermal Daily  . protein  supplement shake  11 oz Oral Q24H  . rivaroxaban  20 mg Oral Q supper  . sodium chloride flush  3 mL Intravenous Q12H   Continuous Infusions:    LOS: 1 day    Time spent: 20 minutes    Faye Ramsay, MD [facility] Pager [phone]  If 7PM-7AM, please contact night-coverage [email or web address] Password [email or web address] 02/29/2016, 10:50 AM

## 2016-03-01 LAB — GLUCOSE, CAPILLARY: GLUCOSE-CAPILLARY: 187 mg/dL — AB (ref 65–99)

## 2016-03-01 MED ORDER — METHYLPREDNISOLONE SODIUM SUCC 40 MG IJ SOLR
40.0000 mg | Freq: Two times a day (BID) | INTRAMUSCULAR | Status: DC
  Administered 2016-03-01 – 2016-03-03 (×4): 40 mg via INTRAVENOUS
  Filled 2016-03-01 (×4): qty 1

## 2016-03-01 NOTE — Progress Notes (Signed)
PT Cancellation Note  Patient Details Name: Heather Banks MRN: [id number] DOB: [birth year]   Cancelled Treatment:     Pt had just returned to bed and asked to rest.  Upon return, pt fast asleep and I did not wake pt up. She says she has been up in her room on her own.  Donato Heinz. Owens Shark, PT  Norwood Levo 03/01/2016, 12:27 PM

## 2016-03-01 NOTE — Progress Notes (Signed)
Patient ID: Heather Banks, female   DOB: 10/14/1950, 65 y.o.   MRN: [id number]    PROGRESS NOTE   Heather Banks  NFA:[id number] DOB: 1951/03/23 DOA: 02/27/2016  PCP: [name], NP   Brief Narrative:  65 y.o. female with stage IV lung cancer status post chemotherapy and palliative radiation, PE on Xarelto, COPD, and depression who presented to the emergency department with several days of worsening dyspnea. Patient was admitted to this institution in late June 2017 and treated for an acute exacerbation in her COPD. She was discharged home with a long prednisone taper which she has since completed. She initially did well back at home, but over the past several days has developed progressively worsening dyspnea with minimal exertion. Patient has a cough productive of thin clear sputum.  Assessment/Plan  1. COPD with acute exacerbation  - There has been marked increase in her baseline dyspnea and cough; sputum is increased but not purulent  - improving, less wheezing and more comfortable this AM - continue solumedrol but OK to taper down to BID today  - continue empiric Levaquin and duonebs scheduled and as needed  - Continue scheduled Breo Ellipta   2. Acute hypoxic respiratory failure secondary to acute on chronic COPD exacerbation  - CXR negative for acute cardiopulmonary disease  - Treat COPD exacerbation as above  - Monitor oxygen saturations  - we are able to taper off solumedrol as pt looks better   3. Non-small cell lung cancer  - Stage IV adenocarcinoma diagnosed 2 yrs ago  - Followed by oncology and under treatment with chemotherapy (carboplatin, s/p 6 cycles); immunotherapy stopped d/t rash; completed palliative radiation in April 2017  - Ongoing management per oncology    4. Hx of PE    - Small acute PE in left lateral basal segment noted on CT 01/01/16 with CT evidence for right-heart strain - CTA PE study from 01/21/16 was neg for acute PE and demonstrates a stable  appearance to small PE in left lateral base - continue Xarelto   5. Depression  - Stable  - Continue current management with Remeron   6. GERD - Stable  DVT prophylaxis: Xarelto  Code Status: Full  Family Communication: Patient at bedside  Disposition Plan: Home in 2-3 days when off IV steroids   Consultants:   None   Procedures:   None   Antimicrobials:   Levaquin   Subjective: Reports feeling better.   Objective: Vitals:   02/29/16 2114 02/29/16 2124 03/01/16 0532 03/01/16 0909  BP:  130/88 (!) 152/96   Pulse:  94 84   Resp:  20 19   Temp:  98.8 F (37.1 C) 98 F (36.7 C)   TempSrc:  Oral Oral   SpO2: 94% 93% 98% 94%  Weight:      Height:        Intake/Output Summary (Last 24 hours) at 03/01/16 0940 Last data filed at 03/01/16 0540  Gross per 24 hour  Intake              840 ml  Output                0 ml  Net              840 ml   Filed Weights   02/27/16 2032 02/27/16 2359  Weight: 88.5 kg (195 lb) 87.7 kg (193 lb 5.5 oz)    Examination:  General exam: Appears calm and comfortable  Respiratory system: Better air  movement this AM, less exp wheezing  Cardiovascular system: S1 & S2 heard, RRR. No JVD, murmurs, rubs, gallops or clicks.  Gastrointestinal system: Abdomen is nondistended, soft and nontender. No organomegaly Central nervous system: Alert and oriented. No focal neurological deficits. Extremities: Symmetric 5 x 5 power. Skin: No rashes, lesions or ulcers   Data Reviewed: I have personally reviewed following labs and imaging studies  CBC:  Recent Labs Lab 02/27/16 2043 02/29/16 0438  WBC 8.9 16.0*  NEUTROABS 5.4  --   HGB 13.5 12.7  HCT 40.6 40.0  MCV 94.0 95.0  PLT 375 432   Basic Metabolic Panel:  Recent Labs Lab 02/26/16 0917 02/27/16 2043 02/29/16 0438  NA 140 138 138  K 4.5 3.8 4.3  CL 104 105 102  CO2 '23 25 28  '$ GLUCOSE 108* 167* 147*  BUN '9 14 19  '$ CREATININE 0.58 0.52 0.60  CALCIUM 9.7 9.2 9.2   Liver  Function Tests:  Recent Labs Lab 02/26/16 0917  AST 16  ALT 12  ALKPHOS 139*  BILITOT 0.3  PROT 7.2  ALBUMIN 4.3   Urine analysis:    Component Value Date/Time   COLORURINE YELLOW 01/19/2016 0900   APPEARANCEUR CLEAR 01/19/2016 0900   LABSPEC 1.040 (H) 01/19/2016 0900   PHURINE 5.5 01/19/2016 0900   GLUCOSEU NEGATIVE 01/19/2016 0900   HGBUR TRACE (A) 01/19/2016 0900   BILIRUBINUR NEGATIVE 01/19/2016 0900   KETONESUR 15 (A) 01/19/2016 0900   PROTEINUR 100 (A) 01/19/2016 0900   NITRITE NEGATIVE 01/19/2016 0900   LEUKOCYTESUR NEGATIVE 01/19/2016 0900    Radiology Studies: No results found.    Scheduled Meds: . acidophilus  1 capsule Oral Daily  . antiseptic oral rinse  7 mL Mouth Rinse BID  . famotidine  20 mg Oral BID  . fluticasone furoate-vilanterol  1 puff Inhalation QHS  . ipratropium-albuterol  3 mL Nebulization TID  . levofloxacin (LEVAQUIN) IV  750 mg Intravenous QHS  . methylPREDNISolone (SOLU-MEDROL) injection  60 mg Intravenous Q6H  . nicotine  21 mg Transdermal Daily  . protein supplement shake  11 oz Oral Q24H  . rivaroxaban  20 mg Oral Q supper  . sodium chloride flush  3 mL Intravenous Q12H   Continuous Infusions:    LOS: 2 days    Time spent: 20 minutes    Faye Ramsay, MD [facility] Pager [phone]  If 7PM-7AM, please contact night-coverage [email or web address] Password [email or web address] 03/01/2016, 9:40 AM

## 2016-03-01 NOTE — Progress Notes (Signed)
Occupational Therapy Treatment Patient Details Name: Heather Banks MRN: [id number] DOB: 02/21/1951 Today's Date: 03/01/2016    History of present illness Heather Banks 65 yo admit with h/o significant for stage IV lung cancer s/p chemo and palliative radiation, h/o PE on arelto, COPD, and depression. Pt with recent worsening of dyspnea with minimal exertion. In June hopsitalized and treated for acute exacerbation of her COPD and returned home doing fairly well until recently. IN ED found to have O2 sat on room air 82% abd tach 122 rate.    OT comments  Pt verbalizes energy conservation, demonstrates pursed lip breathing and use of reacher  Follow Up Recommendations  Supervision - Intermittent    Equipment Recommendations  None recommended by OT (pt does not want tub seat:  afraid of scratching new tub)    Recommendations for Other Services      Precautions / Restrictions Precautions Precautions: None Precaution Comments: watch O2  Restrictions Weight Bearing Restrictions: No       Mobility Bed Mobility Overal bed mobility: Independent                Transfers Overall transfer level: Independent                    Balance                                   ADL                                         General ADL Comments: reviewed energy conservation education and practiced with reacher.  Pt will not wear socks in the summer, but she verbalizes understanding of sock aide.  She does initiate rest breaks between activities (ie, sitting eob then standing after a rest break.  she utilizes pursed lip breathing also.  Pt did not want to complete ADL regiment this am., but did get up to chair. Encouraged short burst of activity alternating with rest.  Sats 94-97% while on 02 but during coughing spell (extended) sats to 88%      Vision                     Perception     Praxis      Cognition   Behavior During Therapy: [facility] for  tasks assessed/performed Overall Cognitive Status: Within Functional Limits for tasks assessed                       Extremity/Trunk Assessment               Exercises     Shoulder Instructions       General Comments      Pertinent Vitals/ Pain       Pain Assessment: No/denies pain  Home Living                                          Prior Functioning/Environment              Frequency       Progress Toward Goals  OT Goals(current goals can now be found in the care plan section)  Progress towards OT goals: Progressing  toward goals  Acute Rehab OT Goals Patient Stated Goal: I want to be able to get my breathing better . I like to be independent Time For Goal Achievement: 03/07/16  Plan      Co-evaluation                 End of Session     Activity Tolerance Patient tolerated treatment well   Patient Left in chair;with call bell/phone within reach   Nurse Communication          Time: 6270-3500 OT Time Calculation (min): 31 min  Charges: OT General Charges $OT Visit: 1 Procedure OT Treatments $Therapeutic Activity: 23-37 mins  Heather Banks 03/01/2016, 10:21 AM  Heather Banks, OTR/L [phone] 03/01/2016

## 2016-03-02 LAB — GLUCOSE, CAPILLARY: GLUCOSE-CAPILLARY: 104 mg/dL — AB (ref 65–99)

## 2016-03-02 NOTE — Progress Notes (Signed)
Patient ID: Heather Banks, female   DOB: 14-Sep-1950, 65 y.o.   MRN: [id number]    PROGRESS NOTE   Heather Banks  MOQ:[id number] DOB: 07-13-1951 DOA: 02/27/2016  PCP: [name], NP   Brief Narrative:  65 y.o. female with stage IV lung cancer status post chemotherapy and palliative radiation, PE on Xarelto, COPD, and depression who presented to the emergency department with several days of worsening dyspnea. Patient was admitted to this institution in late June 2017 and treated for an acute exacerbation in her COPD. She was discharged home with a long prednisone taper which she has since completed. She initially did well back at home, but over the past several days has developed progressively worsening dyspnea with minimal exertion. Patient has a cough productive of thin clear sputum.  Assessment/Plan  1. COPD with acute exacerbation  - There has been marked increase in her baseline dyspnea and cough; sputum is increased but not purulent  - was bit better yesterday and since steroids tapered down, pt with more wheezing this AM but clinically stable  - continue solumedrol with no plan to taper down any further and reassess in AM  - continue empiric Levaquin and duonebs scheduled and as needed  - Continue scheduled Breo Ellipta   2. Acute hypoxic respiratory failure secondary to acute on chronic COPD exacerbation  - CXR negative for acute cardiopulmonary disease  - Treat COPD exacerbation as above  - try to taper off oxygen if possible as pt has not had oxygen needs at baseline at home   3. Non-small cell lung cancer  - Stage IV adenocarcinoma diagnosed 2 yrs ago  - Followed by oncology and under treatment with chemotherapy (carboplatin, s/p 6 cycles); immunotherapy stopped d/t rash; completed palliative radiation in April 2017  - Ongoing management per oncology    4. Hx of PE    - Small acute PE in left lateral basal segment noted on CT 01/01/16 with CT evidence for right-heart  strain - CTA PE study from 01/21/16 was neg for acute PE and demonstrates a stable appearance to small PE in left lateral base - continue Xarelto   5. Depression  - Stable  - Continue current management with Remeron   6. GERD - Stable  DVT prophylaxis: Xarelto  Code Status: Full  Family Communication: Patient at bedside  Disposition Plan: Home in 2-3 days when off IV steroids   Consultants:   None   Procedures:   None   Antimicrobials:   Levaquin   Subjective: Reports feeling better.   Objective: Vitals:   03/01/16 2003 03/01/16 2056 03/02/16 0440 03/02/16 0914  BP: (!) 150/82  134/77   Pulse: 83  66   Resp: 18  20   Temp: 98.5 F (36.9 C)  98.4 F (36.9 C)   TempSrc: Oral  Oral   SpO2: 95% 95% 94% 94%  Weight:      Height:        Intake/Output Summary (Last 24 hours) at 03/02/16 1150 Last data filed at 03/02/16 0950  Gross per 24 hour  Intake              995 ml  Output                0 ml  Net              995 ml   Filed Weights   02/27/16 2032 02/27/16 2359  Weight: 88.5 kg (195 lb) 87.7 kg (193 lb 5.5  oz)    Examination:  General exam: Appears calm and comfortable  Respiratory system: Better air movement this AM, bit more exp wheezing  Cardiovascular system: S1 & S2 heard, RRR. No JVD, murmurs, rubs, gallops or clicks.  Gastrointestinal system: Abdomen is nondistended, soft and nontender. No organomegaly Central nervous system: Alert and oriented. No focal neurological deficits. Extremities: Symmetric 5 x 5 power. Skin: No rashes, lesions or ulcers   Data Reviewed: I have personally reviewed following labs and imaging studies  CBC:  Recent Labs Lab 02/27/16 2043 02/29/16 0438  WBC 8.9 16.0*  NEUTROABS 5.4  --   HGB 13.5 12.7  HCT 40.6 40.0  MCV 94.0 95.0  PLT 375 433   Basic Metabolic Panel:  Recent Labs Lab 02/26/16 0917 02/27/16 2043 02/29/16 0438  NA 140 138 138  K 4.5 3.8 4.3  CL 104 105 102  CO2 '23 25 28  '$ GLUCOSE  108* 167* 147*  BUN '9 14 19  '$ CREATININE 0.58 0.52 0.60  CALCIUM 9.7 9.2 9.2   Liver Function Tests:  Recent Labs Lab 02/26/16 0917  AST 16  ALT 12  ALKPHOS 139*  BILITOT 0.3  PROT 7.2  ALBUMIN 4.3   Urine analysis:    Component Value Date/Time   COLORURINE YELLOW 01/19/2016 0900   APPEARANCEUR CLEAR 01/19/2016 0900   LABSPEC 1.040 (H) 01/19/2016 0900   PHURINE 5.5 01/19/2016 0900   GLUCOSEU NEGATIVE 01/19/2016 0900   HGBUR TRACE (A) 01/19/2016 0900   BILIRUBINUR NEGATIVE 01/19/2016 0900   KETONESUR 15 (A) 01/19/2016 0900   PROTEINUR 100 (A) 01/19/2016 0900   NITRITE NEGATIVE 01/19/2016 0900   LEUKOCYTESUR NEGATIVE 01/19/2016 0900    Radiology Studies: No results found.    Scheduled Meds: . acidophilus  1 capsule Oral Daily  . antiseptic oral rinse  7 mL Mouth Rinse BID  . famotidine  20 mg Oral BID  . fluticasone furoate-vilanterol  1 puff Inhalation QHS  . ipratropium-albuterol  3 mL Nebulization TID  . levofloxacin (LEVAQUIN) IV  750 mg Intravenous QHS  . methylPREDNISolone (SOLU-MEDROL) injection  40 mg Intravenous Q12H  . nicotine  21 mg Transdermal Daily  . protein supplement shake  11 oz Oral Q24H  . rivaroxaban  20 mg Oral Q supper  . sodium chloride flush  3 mL Intravenous Q12H   Continuous Infusions:    LOS: 3 days    Time spent: 20 minutes    Faye Ramsay, MD [facility] Pager [phone]  If 7PM-7AM, please contact night-coverage [email or web address] Password [email or web address] 03/02/2016, 11:50 AM

## 2016-03-03 LAB — BASIC METABOLIC PANEL
ANION GAP: 8 (ref 5–15)
BUN: 20 mg/dL (ref 6–20)
CALCIUM: 9.2 mg/dL (ref 8.9–10.3)
CO2: 28 mmol/L (ref 22–32)
Chloride: 99 mmol/L — ABNORMAL LOW (ref 101–111)
Creatinine, Ser: 0.53 mg/dL (ref 0.44–1.00)
GFR calc Af Amer: >60 mL/min (ref >60)
GFR calc non Af Amer: >60 mL/min (ref >60)
GLUCOSE: 128 mg/dL — AB (ref 65–99)
POTASSIUM: 4.4 mmol/L (ref 3.5–5.1)
Sodium: 135 mmol/L (ref 135–145)

## 2016-03-03 LAB — CBC
HEMATOCRIT: 40.7 % (ref 36.0–46.0)
Hemoglobin: 13.4 g/dL (ref 12.0–15.0)
MCH: 30.7 pg (ref 26.0–34.0)
MCHC: 32.9 g/dL (ref 30.0–36.0)
MCV: 93.1 fL (ref 78.0–100.0)
Platelets: 330 10*3/uL (ref 150–400)
RBC: 4.37 MIL/uL (ref 3.87–5.11)
RDW: 14.3 % (ref 11.5–15.5)
WBC: 13 10*3/uL — AB (ref 4.0–10.5)

## 2016-03-03 MED ORDER — PREDNISONE 50 MG PO TABS
50.0000 mg | ORAL_TABLET | Freq: Every day | ORAL | Status: DC
  Administered 2016-03-04: 50 mg via ORAL
  Filled 2016-03-03: qty 1

## 2016-03-03 NOTE — Progress Notes (Signed)
Occupational Therapy Treatment Patient Details Name: Heather Banks MRN: [id number] DOB: 07-07-1951 Today's Date: 03/03/2016    History of present illness Heather Banks 65 yo admit with h/o significant for stage IV lung cancer s/p chemo and palliative radiation, h/o PE on arelto, COPD, and depression. Pt with recent worsening of dyspnea with minimal exertion. In June hopsitalized and treated for acute exacerbation of her COPD and returned home doing fairly well until recently. IN ED found to have O2 sat on room air 82% abd tach 122 rate.    OT comments  Pt verbalizes good understanding of energy conservation strategies  Follow Up Recommendations  Supervision - Intermittent    Equipment Recommendations  None recommended by OT    Recommendations for Other Services      Precautions / Restrictions Precautions Precautions: Other (comment) (monitor O2) Precaution Comments: pt denies falls in past 1 year Restrictions Weight Bearing Restrictions: No       Mobility Bed Mobility               General bed mobility comments: up in chair  Transfers Overall transfer level: Independent Equipment used: None                  Balance                                   ADL Overall ADL's : Needs assistance/impaired                                       General ADL Comments: OT reinterated energy conservation strategies. Pt able to give OT specific examples energy conservation strategies that she can incorporate into her ADL's.  Pt able to verbalize and demonstrate use of AE.  SAts 95 with no oxygen during OT session.      Vision                            Cognition   Behavior During Therapy: WFL for tasks assessed/performed Overall Cognitive Status: Within Functional Limits for tasks assessed                       Extremity/Trunk Assessment                          Pertinent Vitals/ Pain       Pain Assessment:  No/denies pain Pain Score: 6  Pain Location: mid back pain Pain Descriptors / Indicators: Tightness Pain Intervention(s): Limited activity within patient's tolerance;Monitored during session  Home Living                                          Prior Functioning/Environment              Frequency       Progress Toward Goals  OT Goals(current goals can now be found in the care plan section)  Progress towards OT goals: Progressing toward goals  Acute Rehab OT Goals Patient Stated Goal: I want to be able to get my breathing better . I like to be independent  Plan Discharge plan remains appropriate    Co-evaluation  End of Session     Activity Tolerance Patient tolerated treatment well   Patient Left in chair;with call bell/phone within reach   Nurse Communication          Time: 1102-1117 OT Time Calculation (min): 10 min  Charges: OT General Charges $OT Visit: 1 Procedure OT Treatments $Self Care/Home Management : 8-22 mins  Heather Banks, Edwena Felty D 03/03/2016, 1:04 PM

## 2016-03-03 NOTE — Progress Notes (Signed)
The plan is hopefully for the pt to go home in AM. Pt has ambulated in the hall with PT and by herself without O@ and tolerated well.

## 2016-03-03 NOTE — Progress Notes (Addendum)
Physical Therapy Treatment Patient Details Name: Medha Pippen MRN: [id number] DOB: 1951/04/28 Today's Date: 03/03/2016    History of Present Illness [name] 65 yo admit with h/o significant for stage IV lung cancer s/p chemo and palliative radiation, h/o PE on arelto, COPD, and depression. Pt with recent worsening of dyspnea with minimal exertion. In June hopsitalized and treated for acute exacerbation of her COPD and returned home doing fairly well until recently. IN ED found to have O2 sat on room air 82% abd tach 122 rate.     PT Comments    Much improved activity tolerance today, pt ambulated 400' (200' with walker, 200' without assistive device with no loss of balance) with SaO2 94% on room air.  Performed seated BUE/LE exercises for strengthening. Pt safe to ambulate in halls independently from PT standpoint. No supplemental O2 needed with activity today.    Follow Up Recommendations  No PT follow up     Equipment Recommendations  None recommended by PT    Recommendations for Other Services       Precautions / Restrictions Precautions Precautions: Other (comment) (monitor O2) Precaution Comments: pt denies falls in past 1 year Restrictions Weight Bearing Restrictions: No    Mobility  Bed Mobility               General bed mobility comments: up in chair  Transfers Overall transfer level: Independent Equipment used: None                Ambulation/Gait Ambulation/Gait assistance: Supervision Ambulation Distance (Feet): 400 Feet Assistive device: Rolling walker (2 wheeled);None Gait Pattern/deviations: Decreased stride length Gait velocity: slow   General Gait Details: 200' with RW, 200' without AD, no LOB, SaO2 94% on RA walking   Stairs            Wheelchair Mobility    Modified Rankin (Stroke Patients Only)       Balance                                    Cognition Arousal/Alertness: Awake/alert Behavior During  Therapy: WFL for tasks assessed/performed Overall Cognitive Status: Within Functional Limits for tasks assessed                      Exercises General Exercises - Upper Extremity Shoulder Flexion: AROM;Both;10 reps;Seated General Exercises - Lower Extremity Ankle Circles/Pumps: AROM;Both;10 reps;Seated Long Arc Quad: AROM;Both;10 reps;Seated Hip Flexion/Marching: AROM;10 reps;Both;Seated    General Comments        Pertinent Vitals/Pain Pain Score: 6  Pain Location: mid back pain Pain Descriptors / Indicators: Tightness Pain Intervention(s): Limited activity within patient's tolerance;Monitored during session    Home Living                      Prior Function            PT Goals (current goals can now be found in the care plan section) Acute Rehab PT Goals Patient Stated Goal: I want to be able to get my breathing better . I like to be independent PT Goal Formulation: With patient Time For Goal Achievement: 03/13/16 Potential to Achieve Goals: Good Progress towards PT goals: Progressing toward goals    Frequency  Min 3X/week    PT Plan Current plan remains appropriate    Co-evaluation  End of Session Equipment Utilized During Treatment: Gait belt Activity Tolerance: Patient tolerated treatment well (limited by shortness of breathe) Patient left: in chair (Pt sitting edge of bed with nurse in room )     Time: 8546-2703 PT Time Calculation (min) (ACUTE ONLY): 29 min  Charges:  $Gait Training: 8-22 mins $Therapeutic Exercise: 8-22 mins                    G Codes:      Philomena Doheny 03/03/2016, 12:06 PM [phone]

## 2016-03-03 NOTE — Progress Notes (Signed)
Patient ID: Heather Banks, female   DOB: 1950/12/07, 65 y.o.   MRN: [id number]    PROGRESS NOTE   Heather Banks  [id number]:[id number] DOB: 01/09/1951 DOA: 02/27/2016  PCP: [name], NP   Brief Narrative:  65 y.o. female with stage IV lung cancer status post chemotherapy and palliative radiation, PE on Xarelto, COPD, and depression who presented to the emergency department with several days of worsening dyspnea. Patient was admitted to this institution in late June 2017 and treated for an acute exacerbation in her COPD. She was discharged home with a long prednisone taper which she has since completed. She initially did well back at home, but over the past several days has developed progressively worsening dyspnea with minimal exertion. Patient has a cough productive of thin clear sputum.  Assessment/Plan  1. COPD with acute exacerbation  - continue to improve and cough is now more productive - will transition to oral Prednisone and see how she does on AM - continue empiric Levaquin and duonebs scheduled and as needed  - Continue scheduled Breo Ellipta   2. Acute hypoxic respiratory failure secondary to acute on chronic COPD exacerbation  - CXR negative for acute cardiopulmonary disease  - Treat COPD exacerbation as above  - try to taper off oxygen if possible as pt has not had oxygen needs at baseline at home  - pt made aware she may be going home with oxygen and she is OK with that   3. Non-small cell lung cancer  - Stage IV adenocarcinoma diagnosed 2 yrs ago  - Followed by oncology and under treatment with chemotherapy (carboplatin, s/p 6 cycles); immunotherapy stopped d/t rash; completed palliative radiation in April 2017  - Ongoing management per oncology    4. Hx of PE    - Small acute PE in left lateral basal segment noted on CT 01/01/16 with CT evidence for right-heart strain - CTA PE study from 01/21/16 was neg for acute PE and demonstrates a stable appearance to small PE in left  lateral base - continue Xarelto   5. Depression  - Stable  - Continue current management with Remeron   6. GERD - Stable  DVT prophylaxis: Xarelto  Code Status: Full  Family Communication: Patient at bedside  Disposition Plan: Home possibly in AM  Consultants:   None   Procedures:   None   Antimicrobials:   Levaquin   Subjective: Reports feeling better.   Objective: Vitals:   03/02/16 2134 03/03/16 0618 03/03/16 0906 03/03/16 1202  BP: (!) 146/87 135/68    Pulse: (!) 101 70    Resp: 18 16    Temp: 98.8 F (37.1 C) 98.2 F (36.8 C)    TempSrc: Oral Oral    SpO2: 91% 93% 97% 94%  Weight:      Height:        Intake/Output Summary (Last 24 hours) at 03/03/16 1244 Last data filed at 03/03/16 0935  Gross per 24 hour  Intake              720 ml  Output                0 ml  Net              720 ml   Filed Weights   02/27/16 2032 02/27/16 2359  Weight: 88.5 kg (195 lb) 87.7 kg (193 lb 5.5 oz)    Examination:  General exam: Appears calm and comfortable  Respiratory system: Better air movement  this AM, mild end expiratory wheezing Cardiovascular system: S1 & S2 heard, RRR. No JVD, murmurs, rubs, gallops or clicks.  Gastrointestinal system: Abdomen is nondistended, soft and nontender. No organomegaly Central nervous system: Alert and oriented. No focal neurological deficits. Extremities: Symmetric 5 x 5 power. Skin: No rashes, lesions or ulcers   Data Reviewed: I have personally reviewed following labs and imaging studies  CBC:  Recent Labs Lab 02/27/16 2043 02/29/16 0438 03/03/16 0404  WBC 8.9 16.0* 13.0*  NEUTROABS 5.4  --   --   HGB 13.5 12.7 13.4  HCT 40.6 40.0 40.7  MCV 94.0 95.0 93.1  PLT 375 353 498   Basic Metabolic Panel:  Recent Labs Lab 02/26/16 0917 02/27/16 2043 02/29/16 0438 03/03/16 0404  NA 140 138 138 135  K 4.5 3.8 4.3 4.4  CL 104 105 102 99*  CO2 '23 25 28 28  '$ GLUCOSE 108* 167* 147* 128*  BUN '9 14 19 20    '$ CREATININE 0.58 0.52 0.60 0.53  CALCIUM 9.7 9.2 9.2 9.2   Liver Function Tests:  Recent Labs Lab 02/26/16 0917  AST 16  ALT 12  ALKPHOS 139*  BILITOT 0.3  PROT 7.2  ALBUMIN 4.3   Urine analysis:    Component Value Date/Time   COLORURINE YELLOW 01/19/2016 0900   APPEARANCEUR CLEAR 01/19/2016 0900   LABSPEC 1.040 (H) 01/19/2016 0900   PHURINE 5.5 01/19/2016 0900   GLUCOSEU NEGATIVE 01/19/2016 0900   HGBUR TRACE (A) 01/19/2016 0900   BILIRUBINUR NEGATIVE 01/19/2016 0900   KETONESUR 15 (A) 01/19/2016 0900   PROTEINUR 100 (A) 01/19/2016 0900   NITRITE NEGATIVE 01/19/2016 0900   LEUKOCYTESUR NEGATIVE 01/19/2016 0900    Radiology Studies: No results found.    Scheduled Meds: . acidophilus  1 capsule Oral Daily  . antiseptic oral rinse  7 mL Mouth Rinse BID  . famotidine  20 mg Oral BID  . fluticasone furoate-vilanterol  1 puff Inhalation QHS  . ipratropium-albuterol  3 mL Nebulization TID  . levofloxacin (LEVAQUIN) IV  750 mg Intravenous QHS  . methylPREDNISolone (SOLU-MEDROL) injection  40 mg Intravenous Q12H  . nicotine  21 mg Transdermal Daily  . protein supplement shake  11 oz Oral Q24H  . rivaroxaban  20 mg Oral Q supper  . sodium chloride flush  3 mL Intravenous Q12H   Continuous Infusions:    LOS: 4 days    Time spent: 20 minutes    Faye Ramsay, MD [facility] Pager [phone]  If 7PM-7AM, please contact night-coverage [email or web address] Password [facility] 03/03/2016, 12:44 PM

## 2016-03-04 LAB — GLUCOSE, CAPILLARY: GLUCOSE-CAPILLARY: 91 mg/dL (ref 65–99)

## 2016-03-04 MED ORDER — IPRATROPIUM-ALBUTEROL 0.5-2.5 (3) MG/3ML IN SOLN: 3.0000 mL | Freq: Three times a day (TID) | RESPIRATORY_TRACT | 1 refills | Status: DC

## 2016-03-04 MED ORDER — DIAZEPAM 2 MG PO TABS: 2.0000 mg | ORAL_TABLET | Freq: Four times a day (QID) | ORAL | 0 refills | Status: DC | PRN

## 2016-03-04 MED ORDER — PREDNISONE 10 MG PO TABS: ORAL_TABLET | ORAL | 0 refills | Status: DC

## 2016-03-04 MED ORDER — HYDROCODONE-ACETAMINOPHEN 5-325 MG PO TABS: 1.0000 | ORAL_TABLET | ORAL | 0 refills | Status: DC | PRN

## 2016-03-04 NOTE — Discharge Summary (Signed)
Physician Discharge Summary  Heather Banks [id number] DOB: [birth year] DOA: 02/27/2016  PCP: [name], NP  Admit date: 02/27/2016 Discharge date: 03/04/2016  Recommendations for Outpatient Follow-up:  1. Pt will need to follow up with PCP in 1-2 weeks post discharge 2. Please obtain BMP to evaluate electrolytes and kidney function 3. Please also check CBC to evaluate Hg and Hct levels  Discharge Diagnoses:  Principal Problem:   COPD exacerbation ([place]) Active Problems:   Adenocarcinoma of left lung, stage 4 (HCC)   Smoking   Depression (emotion)  Discharge Condition: Stable  Diet recommendation: Heart healthy diet discussed in details   Brief Narrative:  65 y.o.femalewith stage IV lung cancer status post chemotherapy and palliative radiation, PE on Xarelto, COPD, and depression who presented to the emergency department with several days of worsening dyspnea. Patient was admitted to this institution in late June 2017 and treated for an acute exacerbation in her COPD. She was discharged home with a long prednisone taper which she has since completed. She initially did well back at home, but over the past several days has developed progressively worsening dyspnea with minimal exertion. Patient has a cough productive of thin clear sputum.  Assessment/Plan  1. COPD with acute exacerbation  - continue to improve and cough is now more productive - continue to taper steroids down  - Continue scheduled Breo Ellipta   2. Acute hypoxic respiratory failure secondary to acute on chronic COPD exacerbation  - CXR negative for acute cardiopulmonary disease  - tapered off oxygen successfully   3. Non-small cell lung cancer  - Stage IV adenocarcinoma diagnosed 2 yrs ago  - Followed by oncology and under treatment with chemotherapy (carboplatin, s/p 6 cycles); immunotherapy stopped d/t rash; completed palliative radiation in April 2017  - Ongoing management per oncology   4. Hx  of PE  - Small acute PE in left lateral basal segment noted on CT 01/01/16 with CT evidence for right-heart strain - CTA PE study from 01/21/16 was neg for acute PE and demonstrates a stable appearance to small PE in left lateral base - continue Xarelto   5. Depression  - Stable  - Continue current management with Remeron  6. GERD - Stable  DVT prophylaxis: Xarelto  Code Status: Full  Family Communication: Patient at bedside  Disposition Plan: Home   Consultants:   None   Procedures:   None    Procedures/Studies: Dg Chest 2 View  Result Date: 02/06/2016 CLINICAL DATA:  Chest congestion and tightness. History of COPD and lung mass. EXAM: CHEST  2 VIEW COMPARISON:  01/21/2016 CT FINDINGS: Right Port-A-Cath remains in place with the tip in the lower SVC. Heart is normal size. Left upper lobe mass again noted. Spiculated right upper lobe nodule not as well seen on today's plain film. No acute infiltrates or effusions. No acute bony abnormality. IMPRESSION: No acute findings. Left upper lobe mass and spiculated right upper lobe nodule again noted, better seen on prior CT. Electronically Signed   By: Rolm Baptise M.D.   On: 02/06/2016 10:10   Dg Chest Port 1 View  Result Date: 02/27/2016 CLINICAL DATA:  Shortness of breath for 3 days.  Wheezing. EXAM: PORTABLE CHEST 1 VIEW COMPARISON:  Most recent chest radiograph 02/06/2016. Chest CT 01/21/2016 FINDINGS: Tip of the right chest port in the mid SVC. Cardiomediastinal contours are normal, there is atherosclerosis of the aortic arch. The lungs are hyperinflated. Left upper lobe mass again seen. Spiculated right upper lobe nodule  only faintly visualized. No confluent airspace disease, evidence of pleural effusion or pneumothorax. No pulmonary edema. Grossly stable osseous structures. IMPRESSION: 1. No acute process. 2. Aortic atherosclerosis. 3. Left upper lobe mass is unchanged. Spiculated right upper lobe nodule was not as well  visualized Electronically Signed   By: Jeb Levering M.D.   On: 02/27/2016 21:00    Discharge Exam: Vitals:   03/03/16 2118 03/04/16 0509  BP: 127/89 (!) 136/91  Pulse: (!) 128 65  Resp: 18 17  Temp: 98.5 F (36.9 C) 97.5 F (36.4 C)   Vitals:   03/03/16 1424 03/03/16 2049 03/03/16 2118 03/04/16 0509  BP:   127/89 (!) 136/91  Pulse:   (!) 128 65  Resp:   18 17  Temp:   98.5 F (36.9 C) 97.5 F (36.4 C)  TempSrc:   Oral Oral  SpO2: 92% 92% 91% 93%  Weight:      Height:        General: Pt is alert, follows commands appropriately, not in acute distress Cardiovascular: Regular rate and rhythm, S1/S2 +, no murmurs, no rubs, no gallops Respiratory: Clear to auscultation bilaterally, no wheezing, mild rhonchi at bases  Abdominal: Soft, non tender, non distended, bowel sounds +, no guarding  Discharge Instructions     Medication List    TAKE these medications   acidophilus Caps capsule Take 1 capsule by mouth daily.   albuterol (2.5 MG/3ML) 0.083% nebulizer solution Commonly known as:  PROVENTIL Take 3 mLs (2.5 mg total) by nebulization every 6 (six) hours as needed for wheezing or shortness of breath.   albuterol 108 (90 Base) MCG/ACT inhaler Commonly known as:  PROVENTIL HFA;VENTOLIN HFA Inhale 2 puffs into the lungs every 6 (six) hours as needed for wheezing or shortness of breath.   aspirin-acetaminophen-caffeine 250-250-65 MG tablet Commonly known as:  EXCEDRIN MIGRAINE Take 2 tablets by mouth every 6 (six) hours as needed for headache.   BREO ELLIPTA 100-25 MCG/INH Aepb Generic drug:  fluticasone furoate-vilanterol Inhale 1 puff into the lungs at bedtime.   diphenhydrAMINE 25 mg capsule Commonly known as:  BENADRYL Take 25 mg by mouth every 6 (six) hours as needed for itching. Reported on 01/19/2016   HYDROcodone-acetaminophen 5-325 MG tablet Commonly known as:  NORCO/VICODIN Take 1-2 tablets by mouth every 4 (four) hours as needed for moderate pain or  severe pain.   hydrocortisone 1 % ointment Apply 1 application topically 2 (two) times daily.   ipratropium-albuterol 0.5-2.5 (3) MG/3ML Soln Commonly known as:  DUONEB Take 3 mLs by nebulization 3 (three) times daily.   lidocaine-prilocaine cream Commonly known as:  EMLA Apply 1 application topically as needed (prior to accessing port).   mirtazapine 30 MG tablet Commonly known as:  REMERON Take 30 mg by mouth at bedtime as needed (for sleep).   nicotine 21 mg/24hr patch Commonly known as:  NICODERM CQ Place 1 patch (21 mg total) onto the skin daily.   predniSONE 10 MG tablet Commonly known as:  DELTASONE Take 50 mg tablet today and taper down by 10 mg daily until completed   prochlorperazine 10 MG tablet Commonly known as:  COMPAZINE Take 1 tablet (10 mg total) by mouth every 6 (six) hours as needed for nausea or vomiting.   ranitidine 150 MG tablet Commonly known as:  ZANTAC Take 150 mg by mouth 2 (two) times daily.   rivaroxaban 20 MG Tabs tablet Commonly known as:  XARELTO Take 1 tablet (20 mg total) by mouth  daily with supper.   traMADol 50 MG tablet Commonly known as:  ULTRAM Take 50 mg by mouth every 12 (twelve) hours as needed for moderate pain.      Follow-up Information    [name], NP .   Specialty:  Family Medicine Contact information: [place] [address] [phone]        Faye Ramsay, MD. Call today.   Specialty:  Internal Medicine Why:  my cell [phone] Contact information: [address] [place] [address] [phone]          The results of significant diagnostics from this hospitalization (including imaging, microbiology, ancillary and laboratory) are listed below for reference.    Labs: Basic Metabolic Panel:  Recent Labs Lab 02/26/16 0917 02/27/16 2043 02/29/16 0438 03/03/16 0404  NA 140 138 138 135  K 4.5 3.8 4.3 4.4  CL 104 105 102 99*  CO2 '23 25 28 28   '$ GLUCOSE 108* 167* 147* 128*  BUN '9 14 19 20  '$ CREATININE 0.58 0.52 0.60 0.53  CALCIUM 9.7 9.2 9.2 9.2   Liver Function Tests:  Recent Labs Lab 02/26/16 0917  AST 16  ALT 12  ALKPHOS 139*  BILITOT 0.3  PROT 7.2  ALBUMIN 4.3   CBC:  Recent Labs Lab 02/27/16 2043 02/29/16 0438 03/03/16 0404  WBC 8.9 16.0* 13.0*  NEUTROABS 5.4  --   --   HGB 13.5 12.7 13.4  HCT 40.6 40.0 40.7  MCV 94.0 95.0 93.1  PLT 375 353 330    BNP (last 3 results)  Recent Labs  01/18/16 1715  BNP 85.2   CBG:  Recent Labs Lab 03/01/16 0947 03/02/16 0745  GLUCAP 187* 104*    SIGNED: Time coordinating discharge: 30 minutes  Faye Ramsay, MD  [facility] 03/04/2016, 6:49 AM Pager [phone]  If 7PM-7AM, please contact night-coverage [email or web address] Password [email or web address]

## 2016-03-04 NOTE — Progress Notes (Signed)
Pt discharged from the unit via wheelchair. Discharge instructions were reviewed with the pt and family member. No questions or concerns at this time.   Damoney Julia W Decorian Schuenemann, RN

## 2016-03-04 NOTE — Discharge Instructions (Signed)
Chronic Obstructive Pulmonary Disease °Chronic obstructive pulmonary disease (COPD) is a common lung problem. In COPD, the flow of air from the lungs is limited. The way your lungs work will probably never return to normal, but there are things you can do to improve your lungs and make yourself feel better. Your doctor may treat your condition with: °· Medicines. °· Oxygen. °· Lung surgery. °· Changes to your diet. °· Rehabilitation. This may involve a team of specialists. °HOME CARE °· Take all medicines as told by your doctor. °· Avoid medicines or cough syrups that dry up your airway (such as antihistamines) and do not allow you to get rid of thick spit. You do not need to avoid them if told differently by your doctor. °· If you smoke, stop. Smoking makes the problem worse. °· Avoid being around things that make your breathing worse (like smoke, chemicals, and fumes). °· Use oxygen therapy and therapy to help improve your lungs (pulmonary rehabilitation) if told by your doctor. If you need home oxygen therapy, ask your doctor if you should buy a tool to measure your oxygen level (oximeter). °· Avoid people who have a sickness you can catch (contagious). °· Avoid going outside when it is very hot, cold, or humid. °· Eat healthy foods. Eat smaller meals more often. Rest before meals. °· Stay active, but remember to also rest. °· Make sure to get all the shots (vaccines) your doctor recommends. Ask your doctor if you need a pneumonia shot. °· Learn and use tips on how to relax. °· Learn and use tips on how to control your breathing as told by your doctor. Try: °¨ Breathing in (inhaling) through your nose for 1 second. Then, pucker your lips and breath out (exhale) through your lips for 2 seconds. °¨ Putting one hand on your belly (abdomen). Breathe in slowly through your nose for 1 second. Your hand on your belly should move out. Pucker your lips and breathe out slowly through your lips. Your hand on your belly  should move in as you breathe out. °· Learn and use controlled coughing to clear thick spit from your lungs. The steps are: °1. Lean your head a little forward. °2. Breathe in deeply. °3. Try to hold your breath for 3 seconds. °4. Keep your mouth slightly open while coughing 2 times. °5. Spit any thick spit out into a tissue. °6. Rest and do the steps again 1 or 2 times as needed. °GET HELP IF: °· You cough up more thick spit than usual. °· There is a change in the color or thickness of the spit. °· It is harder to breathe than usual. °· Your breathing is faster than usual. °GET HELP RIGHT AWAY IF: °· You have shortness of breath while resting. °· You have shortness of breath that stops you from: °¨ Being able to talk. °¨ Doing normal activities. °· You chest hurts for longer than 5 minutes. °· Your skin color is more blue than usual. °· Your pulse oximeter shows that you have low oxygen for longer than 5 minutes. °MAKE SURE YOU: °· Understand these instructions. °· Will watch your condition. °· Will get help right away if you are not doing well or get worse. °  °This information is not intended to replace advice given to you by your health care provider. Make sure you discuss any questions you have with your health care provider. °  °Document Released: 12/31/2007 Document Revised: 08/04/2014 Document Reviewed: 03/10/2013 °Elsevier Interactive Patient   Education ©2016 [employer] ° °

## 2016-03-24 ENCOUNTER — Telehealth: Payer: Self-pay | Admitting: Pulmonary Disease

## 2016-03-24 NOTE — Telephone Encounter (Signed)
Delsym cough syrup 5 ML twice daily for cough If neck pain persists she may need imaging

## 2016-03-24 NOTE — Telephone Encounter (Signed)
Called and spoke with pt and she stated that she is having severe pain in her back.  She stated that she spent 6 days in the hospital and was d/c earlier this month.  She stated that this pain is made worse with coughing and sneezing.  She stated that the pain shoots through her back and she has to catch her breath some times with the pain.  She is having very thick sputum but she is not able to get this up, due to how painful it is to cough.  RA please advise. Thanks   Allergies  Allergen Reactions  . Ativan [Lorazepam] Other (See Comments)    Reaction:  Makes pt angry

## 2016-03-24 NOTE — Telephone Encounter (Signed)
Spoke with the pt and notified of recs per RA  She verbalized understanding

## 2016-04-01 ENCOUNTER — Emergency Department (HOSPITAL_COMMUNITY): Payer: Medicaid - Out of State

## 2016-04-01 ENCOUNTER — Encounter (HOSPITAL_COMMUNITY): Payer: Self-pay | Admitting: Emergency Medicine

## 2016-04-01 ENCOUNTER — Telehealth: Payer: Self-pay | Admitting: Pulmonary Disease

## 2016-04-01 ENCOUNTER — Inpatient Hospital Stay (HOSPITAL_COMMUNITY)
Admission: EM | Admit: 2016-04-01 | Discharge: 2016-04-05 | DRG: 190 | Disposition: A | Discharge status: Home / Self Care | Payer: Medicaid - Out of State | Attending: Internal Medicine | Admitting: Internal Medicine

## 2016-04-01 DIAGNOSIS — C3412 Malignant neoplasm of upper lobe, left bronchus or lung: Secondary | ICD-10-CM | POA: Diagnosis present

## 2016-04-01 DIAGNOSIS — F410 Panic disorder [episodic paroxysmal anxiety] without agoraphobia: Secondary | ICD-10-CM | POA: Diagnosis present

## 2016-04-01 DIAGNOSIS — Z79899 Other long term (current) drug therapy: Secondary | ICD-10-CM

## 2016-04-01 DIAGNOSIS — Z825 Family history of asthma and other chronic lower respiratory diseases: Secondary | ICD-10-CM

## 2016-04-01 DIAGNOSIS — J9691 Respiratory failure, unspecified with hypoxia: Secondary | ICD-10-CM | POA: Diagnosis present

## 2016-04-01 DIAGNOSIS — Z923 Personal history of irradiation: Secondary | ICD-10-CM | POA: Diagnosis not present

## 2016-04-01 DIAGNOSIS — F329 Major depressive disorder, single episode, unspecified: Secondary | ICD-10-CM | POA: Diagnosis present

## 2016-04-01 DIAGNOSIS — Z7951 Long term (current) use of inhaled steroids: Secondary | ICD-10-CM | POA: Diagnosis not present

## 2016-04-01 DIAGNOSIS — J441 Chronic obstructive pulmonary disease with (acute) exacerbation: Secondary | ICD-10-CM | POA: Diagnosis present

## 2016-04-01 DIAGNOSIS — Y95 Nosocomial condition: Secondary | ICD-10-CM | POA: Diagnosis present

## 2016-04-01 DIAGNOSIS — J189 Pneumonia, unspecified organism: Secondary | ICD-10-CM

## 2016-04-01 DIAGNOSIS — K219 Gastro-esophageal reflux disease without esophagitis: Secondary | ICD-10-CM | POA: Diagnosis present

## 2016-04-01 DIAGNOSIS — J440 Chronic obstructive pulmonary disease with acute lower respiratory infection: Principal | ICD-10-CM | POA: Diagnosis present

## 2016-04-01 DIAGNOSIS — Z7901 Long term (current) use of anticoagulants: Secondary | ICD-10-CM

## 2016-04-01 DIAGNOSIS — Z9221 Personal history of antineoplastic chemotherapy: Secondary | ICD-10-CM

## 2016-04-01 DIAGNOSIS — E876 Hypokalemia: Secondary | ICD-10-CM | POA: Diagnosis present

## 2016-04-01 DIAGNOSIS — F17210 Nicotine dependence, cigarettes, uncomplicated: Secondary | ICD-10-CM | POA: Diagnosis present

## 2016-04-01 DIAGNOSIS — Z86711 Personal history of pulmonary embolism: Secondary | ICD-10-CM | POA: Diagnosis not present

## 2016-04-01 DIAGNOSIS — J9622 Acute and chronic respiratory failure with hypercapnia: Secondary | ICD-10-CM

## 2016-04-01 DIAGNOSIS — J9690 Respiratory failure, unspecified, unspecified whether with hypoxia or hypercapnia: Secondary | ICD-10-CM | POA: Diagnosis present

## 2016-04-01 DIAGNOSIS — R0600 Dyspnea, unspecified: Secondary | ICD-10-CM | POA: Diagnosis present

## 2016-04-01 DIAGNOSIS — J9621 Acute and chronic respiratory failure with hypoxia: Secondary | ICD-10-CM

## 2016-04-01 DIAGNOSIS — Z888 Allergy status to other drugs, medicaments and biological substances status: Secondary | ICD-10-CM

## 2016-04-01 LAB — BASIC METABOLIC PANEL
ANION GAP: 11 (ref 5–15)
BUN: 14 mg/dL (ref 6–20)
CO2: 25 mmol/L (ref 22–32)
Calcium: 8.6 mg/dL — ABNORMAL LOW (ref 8.9–10.3)
Chloride: 106 mmol/L (ref 101–111)
Creatinine, Ser: 0.7 mg/dL (ref 0.44–1.00)
GFR calc Af Amer: >60 mL/min (ref >60)
GLUCOSE: 191 mg/dL — AB (ref 65–99)
POTASSIUM: 3.5 mmol/L (ref 3.5–5.1)
Sodium: 142 mmol/L (ref 135–145)

## 2016-04-01 LAB — CBC
HEMATOCRIT: 40 % (ref 36.0–46.0)
Hemoglobin: 12.7 g/dL (ref 12.0–15.0)
MCH: 30.3 pg (ref 26.0–34.0)
MCHC: 31.8 g/dL (ref 30.0–36.0)
MCV: 95.5 fL (ref 78.0–100.0)
Platelets: 337 10*3/uL (ref 150–400)
RBC: 4.19 MIL/uL (ref 3.87–5.11)
RDW: 16.9 % — AB (ref 11.5–15.5)
WBC: 5.6 10*3/uL (ref 4.0–10.5)

## 2016-04-01 LAB — BLOOD GAS, VENOUS
ACID-BASE EXCESS: 0.4 mmol/L (ref 0.0–2.0)
Bicarbonate: 26.2 mmol/L (ref 20.0–28.0)
DELIVERY SYSTEMS: POSITIVE
FIO2: 40
LHR: 15 {breaths}/min
O2 Saturation: 92.3 %
PCO2 VEN: 50 mmHg (ref 44.0–60.0)
PEEP/CPAP: 5 cmH2O
PIP: 12 cmH2O
Patient temperature: 98.6
pH, Ven: 7.34 (ref 7.250–7.430)
pO2, Ven: 69.7 mmHg — ABNORMAL HIGH (ref 32.0–45.0)

## 2016-04-01 LAB — I-STAT TROPONIN, ED: TROPONIN I, POC: 0 ng/mL (ref 0.00–0.08)

## 2016-04-01 LAB — MRSA PCR SCREENING: MRSA by PCR: NEGATIVE

## 2016-04-01 LAB — I-STAT CG4 LACTIC ACID, ED: LACTIC ACID, VENOUS: 0.77 mmol/L (ref 0.5–1.9)

## 2016-04-01 MED ORDER — DEXTROSE 5 % IV SOLN: 1.0000 g | INTRAVENOUS | Status: DC

## 2016-04-01 MED ORDER — VANCOMYCIN HCL IN DEXTROSE 1-5 GM/200ML-% IV SOLN: 1000.0000 mg | Freq: Two times a day (BID) | INTRAVENOUS | Status: DC

## 2016-04-01 MED ORDER — ORAL CARE MOUTH RINSE
15.0000 mL | Freq: Two times a day (BID) | OROMUCOSAL | Status: DC
  Administered 2016-04-02 – 2016-04-03 (×4): 15 mL via OROMUCOSAL

## 2016-04-01 MED ORDER — RIVAROXABAN 20 MG PO TABS
20.0000 mg | ORAL_TABLET | Freq: Every day | ORAL | Status: DC
  Administered 2016-04-02 – 2016-04-04 (×3): 20 mg via ORAL
  Filled 2016-04-01 (×3): qty 1

## 2016-04-01 MED ORDER — DIPHENHYDRAMINE HCL 25 MG PO CAPS: 25.0000 mg | ORAL_CAPSULE | Freq: Four times a day (QID) | ORAL | Status: DC | PRN

## 2016-04-01 MED ORDER — SODIUM CHLORIDE 0.9 % IV SOLN: 250.0000 mL | INTRAVENOUS | Status: DC | PRN

## 2016-04-01 MED ORDER — DEXTROSE 5 % IV SOLN
500.0000 mg | INTRAVENOUS | Status: DC
  Administered 2016-04-01: 500 mg via INTRAVENOUS
  Filled 2016-04-01: qty 500

## 2016-04-01 MED ORDER — ONDANSETRON HCL 4 MG/2ML IJ SOLN: 4.0000 mg | Freq: Four times a day (QID) | INTRAMUSCULAR | Status: DC | PRN

## 2016-04-01 MED ORDER — SODIUM CHLORIDE 0.9% FLUSH: 3.0000 mL | INTRAVENOUS | Status: DC | PRN

## 2016-04-01 MED ORDER — PIPERACILLIN-TAZOBACTAM 3.375 G IVPB 30 MIN
3.3750 g | Freq: Once | INTRAVENOUS | Status: AC
  Administered 2016-04-01: 3.375 g via INTRAVENOUS
  Filled 2016-04-01: qty 50

## 2016-04-01 MED ORDER — IPRATROPIUM-ALBUTEROL 0.5-2.5 (3) MG/3ML IN SOLN: 3.0000 mL | RESPIRATORY_TRACT | Status: DC | PRN

## 2016-04-01 MED ORDER — ALPRAZOLAM 0.5 MG PO TABS
0.5000 mg | ORAL_TABLET | Freq: Every evening | ORAL | Status: DC | PRN
  Administered 2016-04-03 – 2016-04-05 (×2): 0.5 mg via ORAL
  Filled 2016-04-01 (×2): qty 1

## 2016-04-01 MED ORDER — SODIUM CHLORIDE 0.9% FLUSH
3.0000 mL | Freq: Two times a day (BID) | INTRAVENOUS | Status: DC
  Administered 2016-04-01 – 2016-04-04 (×5): 3 mL via INTRAVENOUS

## 2016-04-01 MED ORDER — SODIUM CHLORIDE 0.9% FLUSH
3.0000 mL | Freq: Two times a day (BID) | INTRAVENOUS | Status: DC
  Administered 2016-04-01 – 2016-04-02 (×3): 3 mL via INTRAVENOUS

## 2016-04-01 MED ORDER — FLUTICASONE FUROATE-VILANTEROL 100-25 MCG/INH IN AEPB
1.0000 | INHALATION_SPRAY | Freq: Every day | RESPIRATORY_TRACT | Status: DC
  Filled 2016-04-01: qty 28

## 2016-04-01 MED ORDER — MIRTAZAPINE 15 MG PO TABS: 30.0000 mg | ORAL_TABLET | Freq: Every evening | ORAL | Status: DC | PRN

## 2016-04-01 MED ORDER — LIDOCAINE-PRILOCAINE 2.5-2.5 % EX CREA
TOPICAL_CREAM | CUTANEOUS | Status: DC | PRN
  Administered 2016-04-01: 20:00:00 via TOPICAL
  Filled 2016-04-01: qty 5

## 2016-04-01 MED ORDER — CHLORHEXIDINE GLUCONATE 0.12 % MT SOLN
15.0000 mL | Freq: Two times a day (BID) | OROMUCOSAL | Status: DC
  Administered 2016-04-01 – 2016-04-05 (×8): 15 mL via OROMUCOSAL
  Filled 2016-04-01 (×6): qty 15

## 2016-04-01 MED ORDER — FAMOTIDINE 20 MG PO TABS
20.0000 mg | ORAL_TABLET | Freq: Every day | ORAL | Status: DC
  Administered 2016-04-02 – 2016-04-05 (×4): 20 mg via ORAL
  Filled 2016-04-01 (×4): qty 1

## 2016-04-01 MED ORDER — ACETAMINOPHEN 325 MG PO TABS: 650.0000 mg | ORAL_TABLET | Freq: Four times a day (QID) | ORAL | Status: DC | PRN

## 2016-04-01 MED ORDER — IPRATROPIUM-ALBUTEROL 0.5-2.5 (3) MG/3ML IN SOLN: 3.0000 mL | Freq: Four times a day (QID) | RESPIRATORY_TRACT | Status: DC

## 2016-04-01 MED ORDER — ACETAMINOPHEN 650 MG RE SUPP: 650.0000 mg | Freq: Four times a day (QID) | RECTAL | Status: DC | PRN

## 2016-04-01 MED ORDER — METHYLPREDNISOLONE SODIUM SUCC 40 MG IJ SOLR
40.0000 mg | Freq: Two times a day (BID) | INTRAMUSCULAR | Status: DC
  Administered 2016-04-01 – 2016-04-02 (×2): 40 mg via INTRAVENOUS
  Filled 2016-04-01 (×2): qty 1

## 2016-04-01 MED ORDER — ASPIRIN-ACETAMINOPHEN-CAFFEINE 250-250-65 MG PO TABS
2.0000 | ORAL_TABLET | Freq: Four times a day (QID) | ORAL | Status: DC | PRN
  Filled 2016-04-01: qty 2

## 2016-04-01 MED ORDER — IPRATROPIUM-ALBUTEROL 0.5-2.5 (3) MG/3ML IN SOLN
3.0000 mL | Freq: Four times a day (QID) | RESPIRATORY_TRACT | Status: DC
  Administered 2016-04-01 – 2016-04-02 (×5): 3 mL via RESPIRATORY_TRACT
  Filled 2016-04-01 (×4): qty 3

## 2016-04-01 MED ORDER — DIAZEPAM 2 MG PO TABS: 2.0000 mg | ORAL_TABLET | Freq: Four times a day (QID) | ORAL | Status: DC | PRN

## 2016-04-01 MED ORDER — VANCOMYCIN HCL IN DEXTROSE 1-5 GM/200ML-% IV SOLN
1000.0000 mg | Freq: Two times a day (BID) | INTRAVENOUS | Status: DC
  Administered 2016-04-02: 1000 mg via INTRAVENOUS
  Filled 2016-04-01: qty 200

## 2016-04-01 MED ORDER — VANCOMYCIN HCL IN DEXTROSE 1-5 GM/200ML-% IV SOLN
1000.0000 mg | Freq: Once | INTRAVENOUS | Status: AC
  Administered 2016-04-01: 1000 mg via INTRAVENOUS
  Filled 2016-04-01: qty 200

## 2016-04-01 MED ORDER — TRAMADOL HCL 50 MG PO TABS
50.0000 mg | ORAL_TABLET | Freq: Two times a day (BID) | ORAL | Status: DC | PRN
  Administered 2016-04-03 – 2016-04-04 (×2): 50 mg via ORAL
  Filled 2016-04-01 (×2): qty 1

## 2016-04-01 MED ORDER — PIPERACILLIN-TAZOBACTAM 3.375 G IVPB
3.3750 g | Freq: Three times a day (TID) | INTRAVENOUS | Status: DC
  Administered 2016-04-01 – 2016-04-02 (×3): 3.375 g via INTRAVENOUS
  Filled 2016-04-01 (×3): qty 50

## 2016-04-01 MED ORDER — PIPERACILLIN-TAZOBACTAM 3.375 G IVPB: 3.3750 g | Freq: Three times a day (TID) | INTRAVENOUS | Status: DC

## 2016-04-01 MED ORDER — ONDANSETRON HCL 4 MG PO TABS: 4.0000 mg | ORAL_TABLET | Freq: Four times a day (QID) | ORAL | Status: DC | PRN

## 2016-04-01 MED ORDER — HYDROCODONE-ACETAMINOPHEN 5-325 MG PO TABS
1.0000 | ORAL_TABLET | ORAL | Status: DC | PRN
  Administered 2016-04-02 – 2016-04-05 (×7): 2 via ORAL
  Filled 2016-04-01 (×7): qty 2

## 2016-04-01 NOTE — H&P (Signed)
History and Physical    Heather Banks ZOX:[id number] DOB: 06/25/1951 DOA: 04/01/2016  PCP: [name], NP    Patient coming from: Home  Chief Complaint: Dyspnea  HPI: Heather Banks is a 65 y.o. female with medical history significant of adenocarcinoma of the lung and COPD, who comes to the hospital with chief complaint of worsening dyspnea. Patient has been developing worsening dyspnea for last 6 weeks, worse over the last 7 days, it is moderate to severe intensity, has limited her ability to ambulate, has been associated with cough with increased sputum production and wheezing. There is no improving or worsening factors. Currently she continues to smoke cigarettes. For her COPD she is on inhalers and supplemental oxygen about 3 L.  At home she has inhalers for her COPD.  ED Course: She was found rest distress and placed on noninvasive mechanical ventilation  Review of Systems:  1. Gen. no weight gain or weight loss 2. Cardiovascular, no anginal claudication possibly worsening lower extremity edema 3. Pulmonary positive shortness of breath cough and wheezing as mentioned in present illness 4. Gastrointestinal no nausea vomiting diarrhea 5. Musculoskeletal no joint pain 6. Dermatology no rashes 7. Urology no dysuria or increased urinary frequency 8. Hematology no easy bruisability or frequent infections 9. ENT no runny nose or sore throat 10. Neurology no seizures or paresthesias  Past Medical History:  Diagnosis Date  . Adenocarcinoma of left lung, stage 4 ([place]) 07/09/2014  . COPD (chronic obstructive pulmonary disease) (HCC)    Albuterol neb as needed;Pulmicort neb daily  . Encounter for antineoplastic chemotherapy 01/21/2015  . Full code status 05/01/2015  . GERD (gastroesophageal reflux disease)    takes Pantoprazole and Zantac daily  . History of bronchitis    >5 yrs ago  . History of migraine    last one 2 wks ago  . Hx of radiation therapy 10/29/2015 to 11/16/2015   The  Left upper lobe was treated to 37.5 Gy in 15 fractions at 2.5 Gy per fraction  . Lung mass   . Panic attacks    but doesn't take any meds  . Pneumonia    hx of-last time about 4+yrs ago    Past Surgical History:  Procedure Laterality Date  . ABDOMINAL HYSTERECTOMY    . APPENDECTOMY    . OOPHORECTOMY    . PORTACATH PLACEMENT  jan. 2016  . TONSILLECTOMY    . VIDEO BRONCHOSCOPY WITH ENDOBRONCHIAL NAVIGATION N/A 08/14/2014   Procedure: VIDEO BRONCHOSCOPY WITH ENDOBRONCHIAL NAVIGATION;  Surgeon: Rigoberto Noel, MD;  Location: [facility];  Service: Thoracic;  Laterality: N/A;     reports that she has been smoking Cigarettes.  She has a 11.00 pack-year smoking history. She has never used smokeless tobacco. She reports that she does not drink alcohol or use drugs.  Allergies  Allergen Reactions  . Ativan [Lorazepam] Other (See Comments)    Reaction:  Makes pt angry   . Cinnamon Other (See Comments)    trigger migraine    Family History  Problem Relation Age of Onset  . COPD Father      Prior to Admission medications   Medication Sig Start Date End Date Taking? Authorizing Provider  acidophilus (RISAQUAD) CAPS capsule Take 1 capsule by mouth daily.   Yes Historical Provider, MD  albuterol (PROVENTIL HFA;VENTOLIN HFA) 108 (90 BASE) MCG/ACT inhaler Inhale 2 puffs into the lungs every 6 (six) hours as needed for wheezing or shortness of breath. 06/15/15  Yes Rigoberto Noel, MD  albuterol (PROVENTIL) (2.5 MG/3ML) 0.083% nebulizer solution Take 3 mLs (2.5 mg total) by nebulization every 6 (six) hours as needed for wheezing or shortness of breath. 06/13/15  Yes Rigoberto Noel, MD  ALPRAZolam Duanne Moron) 0.5 MG tablet Take 0.5 mg by mouth at bedtime as needed for sleep or anxiety. 03/20/15  Yes Historical Provider, MD  aspirin-acetaminophen-caffeine (EXCEDRIN MIGRAINE) [phone] MG per tablet Take 2 tablets by mouth every 6 (six) hours as needed for headache.   Yes Historical Provider, MD  diazepam  (VALIUM) 2 MG tablet Take 1 tablet (2 mg total) by mouth every 6 (six) hours as needed for anxiety. 03/04/16  Yes Theodis Blaze, MD  diphenhydrAMINE (BENADRYL) 25 mg capsule Take 25 mg by mouth every 6 (six) hours as needed for itching. Reported on 01/19/2016   Yes Historical Provider, MD  fluticasone furoate-vilanterol (BREO ELLIPTA) 100-25 MCG/INH AEPB Inhale 1 puff into the lungs at bedtime.   Yes Historical Provider, MD  HYDROcodone-acetaminophen (NORCO/VICODIN) 5-325 MG tablet Take 1-2 tablets by mouth every 4 (four) hours as needed for moderate pain or severe pain. 03/04/16  Yes Theodis Blaze, MD  hydrocortisone 1 % ointment Apply 1 application topically 2 (two) times daily. 01/08/16  Yes Curt Bears, MD  ipratropium-albuterol (DUONEB) 0.5-2.5 (3) MG/3ML SOLN Take 3 mLs by nebulization 3 (three) times daily. 03/04/16  Yes Theodis Blaze, MD  lidocaine-prilocaine (EMLA) cream Apply 1 application topically as needed (prior to accessing port).   Yes Historical Provider, MD  mirtazapine (REMERON) 30 MG tablet Take 30 mg by mouth at bedtime as needed (for sleep).   Yes Historical Provider, MD  prochlorperazine (COMPAZINE) 10 MG tablet Take 1 tablet (10 mg total) by mouth every 6 (six) hours as needed for nausea or vomiting. 01/08/16  Yes Curt Bears, MD  ranitidine (ZANTAC) 150 MG tablet Take 150 mg by mouth 2 (two) times daily.    Yes Historical Provider, MD  rivaroxaban (XARELTO) 20 MG TABS tablet Take 1 tablet (20 mg total) by mouth daily with supper. 02/21/16  Yes Erick Colace, NP  traMADol (ULTRAM) 50 MG tablet Take 50 mg by mouth every 12 (twelve) hours as needed for moderate pain.   Yes Historical Provider, MD  nicotine (NICODERM CQ) 21 mg/24hr patch Place 1 patch (21 mg total) onto the skin daily. Patient not taking: Reported on 04/01/2016 08/15/14   Deneise Lever, MD  predniSONE (DELTASONE) 10 MG tablet Take 50 mg tablet today and taper down by 10 mg daily until completed Patient not taking:  Reported on 04/01/2016 03/04/16   Theodis Blaze, MD    Physical Exam: Vitals:   04/01/16 1530 04/01/16 1545 04/01/16 1618 04/01/16 1627  BP: 118/79 100/87 132/87   Pulse: 86 97 88   Resp: 23 22 (!) 25   Temp:    (!) 96.6 F (35.9 C)  TempSrc:    Axillary  SpO2: 94% 95% 97%   Weight:    88.1 kg (194 lb 3.6 oz)  Height:    '5\' 4"'$  (1.626 m)      Constitutional: Deconditioned and ill-looking appearing Vitals:   04/01/16 1530 04/01/16 1545 04/01/16 1618 04/01/16 1627  BP: 118/79 100/87 132/87   Pulse: 86 97 88   Resp: 23 22 (!) 25   Temp:    (!) 96.6 F (35.9 C)  TempSrc:    Axillary  SpO2: 94% 95% 97%   Weight:    88.1 kg (194 lb 3.6 oz)  Height:  $'5\' 4"'Z$  (1.626 m)   Eyes: PERRL, lids and conjunctivae Pallor but no icterus Nose and ears no deformities ENMT: Mucous membranes are dry. Posterior pharynx clear of any exudate or lesions.Normal dentition.  Neck: normal, supple, no masses, no thyromegaly Respiratory: Decreased breath sounds bilaterally, positive expiratory wheezing, basilar crackles and scattered rhonchi. Patient on noninvasive mechanical ventilation. Cardiovascular: Regular rate and rhythm, no murmurs / rubs / gallops. Bilateral 2+ nonpitting lower extremity edema. 2+ pedal pulses. No carotid bruits.  Abdomen: no tenderness, no masses palpated. No hepatosplenomegaly. Bowel sounds positive.  Musculoskeletal: no clubbing / cyanosis. No joint deformity upper and lower extremities. Good ROM, no contractures. Normal muscle tone.  Skin: no rashes, lesions, ulcers. No induration Neurologic: CN 2-12 grossly intact. Sensation intact, DTR normal. Strength 5/5 in all 4.      Labs on Admission: I have personally reviewed following labs and imaging studies  CBC:  Recent Labs Lab 04/01/16 1314  WBC 5.6  HGB 12.7  HCT 40.0  MCV 95.5  PLT 465   Basic Metabolic Panel:  Recent Labs Lab 04/01/16 1314  NA 142  K 3.5  CL 106  CO2 25  GLUCOSE 191*  BUN 14  CREATININE  0.70  CALCIUM 8.6*   GFR: Estimated Creatinine Clearance: 77.4 mL/min (by C-G formula based on SCr of 0.8 mg/dL). Liver Function Tests: No results for input(s): AST, ALT, ALKPHOS, BILITOT, PROT, ALBUMIN in the last 168 hours. No results for input(s): LIPASE, AMYLASE in the last 168 hours. No results for input(s): AMMONIA in the last 168 hours. Coagulation Profile: No results for input(s): INR, PROTIME in the last 168 hours. Cardiac Enzymes: No results for input(s): CKTOTAL, CKMB, CKMBINDEX, TROPONINI in the last 168 hours. BNP (last 3 results) No results for input(s): PROBNP in the last 8760 hours. HbA1C: No results for input(s): HGBA1C in the last 72 hours. CBG: No results for input(s): GLUCAP in the last 168 hours. Lipid Profile: No results for input(s): CHOL, HDL, LDLCALC, TRIG, CHOLHDL, LDLDIRECT in the last 72 hours. Thyroid Function Tests: No results for input(s): TSH, T4TOTAL, FREET4, T3FREE, THYROIDAB in the last 72 hours. Anemia Panel: No results for input(s): VITAMINB12, FOLATE, FERRITIN, TIBC, IRON, RETICCTPCT in the last 72 hours. Urine analysis:    Component Value Date/Time   COLORURINE YELLOW 01/19/2016 0900   APPEARANCEUR CLEAR 01/19/2016 0900   LABSPEC 1.040 (H) 01/19/2016 0900   PHURINE 5.5 01/19/2016 0900   GLUCOSEU NEGATIVE 01/19/2016 0900   HGBUR TRACE (A) 01/19/2016 0900   BILIRUBINUR NEGATIVE 01/19/2016 0900   KETONESUR 15 (A) 01/19/2016 0900   PROTEINUR 100 (A) 01/19/2016 0900   NITRITE NEGATIVE 01/19/2016 0900   LEUKOCYTESUR NEGATIVE 01/19/2016 0900   Sepsis Labs: !!!!!!!!!!!!!!!!!!!!!!!!!!!!!!!!!!!!!!!!!!!! '@LABRCNTIP'$ (procalcitonin:4,lacticidven:4) )No results found for this or any previous visit (from the past 240 hour(s)).   Radiological Exams on Admission: Dg Chest Portable 1 View  Result Date: 04/01/2016 CLINICAL DATA:  Wheezing and tachycardia EXAM: PORTABLE CHEST 1 VIEW COMPARISON:  February 27, 2016 FINDINGS: There is patchy interstitial  prominence and airspace consolidation in the right base. There is also subtle opacity in the left upper lobe, new from prior study. The lungs elsewhere are clear. The heart size and pulmonary vascularity are normal. No adenopathy. Port-A-Cath tip is at the cavoatrial junction. No pneumothorax. No bone lesions. IMPRESSION: Patchy infiltrate right base consistent with pneumonia. Questions second focus of early pneumonia left upper lobe. Lungs elsewhere clear. Stable cardiac silhouette. No pneumothorax. Followup PA and lateral chest radiographs recommended  in 3-4 weeks following trial of antibiotic therapy to ensure resolution and exclude underlying malignancy. Electronically Signed   By: Lowella Grip III M.D.   On: 04/01/2016 14:04    EKG: Independently reviewed.Normal sinus rhythm with a rate of 104 bpm, right axis deviation.  Chest film. Personally reviewed  Assessment/Plan Active Problems:   Respiratory failure ([place])   HCAP (healthcare-associated pneumonia)  This is a 65 year old female with COPD and adenocarcinoma of the lung who presents with worsening dyspnea for 6 weeks more over last week. On the physical examination her blood pressure 108/81, heart rate and the 100s with a respiratory rate of 23 and oxygen saturation of 95-97 on supplemental oxygen. She has been placed on noninvasive mechanical ventilation due to increased work of breathing, she has significant rhonchi, rales and wheezing bilaterally on lung examination, she does have bilateral nonpitting lower extremity edema. Her sodium is 142, potassium 3.5, bicarbonate 25, BUN 14, creatinine 0.70, wbc 5.6, hb 12.7, hematocrit 40.0, platelet count 337. Venous blood gas has pH of 7.34. Marland Kitchen Her chest film has a new infiltrate at right base, her EKG sinus rhythm with right axis deviation.  Working diagnoses: Hypoxic respiratory failure due to comminuted acquired pneumonia, complicated by COPD exacerbation.   1. Hypoxic respiratory failure.  Patient will be continued on noninvasive mechanical ventilation 12/5, currently making 800-900 mL. Tidal volume, medically her work of breathing has improved. Will follow a restrictive IV fluids strategy to avoid volume overload. Last hospitalization was in August 2  2. Healthcare associated pneumonia. Will continue patient on IV vancomycin and Zosyn. Will follow-up on cell count, blood cultures and temperature curve.   3. COPD exacerbation. Will continue patient on bronchodilator therapy with DuoNeb's every 6 hours routine and every 4 hours as needed. Will start patient on Solu-Medrol 40 mg twice daily. Continue inhaled steroids and long-acting beta 2 agonist.   4. History of pulmonary embolism. CT scan from June 2017 showed persistent left lower lobe pulmonary embolism.  5. Depression and anxiety. Continue alprazolam, diazepam and mirtazapine.   6. Stage IV adenocarcinoma of the lung. Old records personally reviewed, patient had systemic chemotherapy as well as palliative radiotherapy currently on observation with Dr. Mayme Genta.   Patient continued to be high risk of developing worsening respiratory failure  DVT prophylaxis: xarelto Code Status: DNI  Family Communication: I spoke with patient's daughter at the bedside and al questions were addressed. Disposition Plan: Home Consults called: none  Admission status: Inpatient    Tc Kapusta Gerome Apley MD [facility] Pager [phone]  If 7PM-7AM, please contact night-coverage [email or web address] Password [email or web address]  04/01/2016, 4:53 PM

## 2016-04-01 NOTE — Progress Notes (Signed)
Pharmacy Antibiotic Follow-up Note  Heather Banks is a 65 y.o. year-old female admitted on 04/01/2016.  The patient is currently on day 1 of Vancomycin & Zosyn for PNA. CXray with infiltrate R base, possible early PNA left lobe  Assessment/Plan: Vancomycin 1gm x1, then 1gm IV every q12 hours.  Goal trough 15-20 mcg/mL. Zosyn 3.375g IV q8h (4 hour infusion).  Temp (24hrs), Avg:98.6 F (37 C), Min:98.6 F (37 C), Max:98.6 F (37 C)   Recent Labs Lab 04/01/16 1314  WBC 5.6    Recent Labs Lab 04/01/16 1314  CREATININE 0.70   CrCl cannot be calculated (Unknown ideal weight.).    Allergies  Allergen Reactions  . Ativan [Lorazepam] Other (See Comments)    Reaction:  Makes pt angry   . Cinnamon Other (See Comments)    trigger migraine   Antimicrobials this admission: 9/5 Zosyn >>  9/5 Vancomycin  >>   Levels/dose changes this admission:  Microbiology results: No results at this time  Thank you for allowing pharmacy to be a part of this patient's care.  Minda Ditto PharmD Pager [phone] 04/01/2016, 2:41 PM

## 2016-04-01 NOTE — ED Notes (Signed)
Bed: IF02 Expected date:  Expected time:  Means of arrival:  Comments: 65 yo F resp distress

## 2016-04-01 NOTE — ED Triage Notes (Signed)
Pt with hx COPD and stage IV lung cancer c/o progressive SOB onset 3 days ago, EMS reports increasing lethargy over coarse of ride to ED. 125 mg solu-medrol IV, 1 mg atrovent neb, 10 mg albuterol neb given en route. Room air O2 saturation 83%.

## 2016-04-01 NOTE — ED Notes (Signed)
Patient states that she will call out when ready to give urine sample.

## 2016-04-01 NOTE — ED Provider Notes (Signed)
[facility] DEPT Provider Note   CSN: [id number] Arrival date & time: 04/01/16  1257     History   Chief Complaint Chief Complaint  Patient presents with  . Shortness of Breath    HPI Heather Banks is a 65 y.o. female.  HPI   Patient is a 65 year old female presenting with soreness of breath. Patient has adenocarcinoma of the left lung stage IV, COPD, history of radiation. Patient's had to 3 days of worsening of shortness of breath. No fevers. On home liters 3. Patient also found to have bone embolism in July 2017, treated with xarlto  Past Medical History:  Diagnosis Date  . Adenocarcinoma of left lung, stage 4 ([place]) 07/09/2014  . COPD (chronic obstructive pulmonary disease) (HCC)    Albuterol neb as needed;Pulmicort neb daily  . Encounter for antineoplastic chemotherapy 01/21/2015  . Full code status 05/01/2015  . GERD (gastroesophageal reflux disease)    takes Pantoprazole and Zantac daily  . History of bronchitis    >5 yrs ago  . History of migraine    last one 2 wks ago  . Hx of radiation therapy 10/29/2015 to 11/16/2015   The Left upper lobe was treated to 37.5 Gy in 15 fractions at 2.5 Gy per fraction  . Lung mass   . Panic attacks    but doesn't take any meds  . Pneumonia    hx of-last time about 4+yrs ago    Patient Active Problem List   Diagnosis Date Noted  . Mid back pain 02/28/2016  . GERD (gastroesophageal reflux disease) 02/28/2016  . Acute respiratory failure with hypoxia ([place]) 02/28/2016  . COPD with acute exacerbation ([place]) 02/28/2016  . History of pulmonary embolus (PE) 02/06/2016  . Respiratory failure ([place]) 01/18/2016  . Primary cancer of left upper lobe of lung ([place]) 10/24/2015  . Drug-induced skin rash 07/31/2015  . Encounter for antineoplastic immunotherapy 06/19/2015  . Chronic fatigue 05/22/2015  . Nausea without vomiting 05/22/2015  . Full code status 05/01/2015  . COPD (chronic obstructive pulmonary disease) ([place]) 02/05/2015  .  Encounter for antineoplastic chemotherapy 01/21/2015  . Depression (emotion) 09/05/2014  . Generalized anxiety disorder 08/21/2014  . Smoking 07/11/2014  . Adenocarcinoma of left lung, stage 4 ([place]) 07/09/2014  . COPD exacerbation ([place]) 07/09/2014    Past Surgical History:  Procedure Laterality Date  . ABDOMINAL HYSTERECTOMY    . APPENDECTOMY    . OOPHORECTOMY    . PORTACATH PLACEMENT  jan. 2016  . TONSILLECTOMY    . VIDEO BRONCHOSCOPY WITH ENDOBRONCHIAL NAVIGATION N/A 08/14/2014   Procedure: VIDEO BRONCHOSCOPY WITH ENDOBRONCHIAL NAVIGATION;  Surgeon: Rigoberto Noel, MD;  Location: [facility];  Service: Thoracic;  Laterality: N/A;    OB History    No data available       Home Medications    Prior to Admission medications   Medication Sig Start Date End Date Taking? Authorizing Provider  acidophilus (RISAQUAD) CAPS capsule Take 1 capsule by mouth daily.    Historical Provider, MD  albuterol (PROVENTIL HFA;VENTOLIN HFA) 108 (90 BASE) MCG/ACT inhaler Inhale 2 puffs into the lungs every 6 (six) hours as needed for wheezing or shortness of breath. 06/15/15   Rigoberto Noel, MD  albuterol (PROVENTIL) (2.5 MG/3ML) 0.083% nebulizer solution Take 3 mLs (2.5 mg total) by nebulization every 6 (six) hours as needed for wheezing or shortness of breath. 06/13/15   Rigoberto Noel, MD  aspirin-acetaminophen-caffeine (EXCEDRIN MIGRAINE) [phone] MG per tablet Take 2 tablets by mouth every  6 (six) hours as needed for headache.    Historical Provider, MD  diazepam (VALIUM) 2 MG tablet Take 1 tablet (2 mg total) by mouth every 6 (six) hours as needed for anxiety. 03/04/16   Theodis Blaze, MD  diphenhydrAMINE (BENADRYL) 25 mg capsule Take 25 mg by mouth every 6 (six) hours as needed for itching. Reported on 01/19/2016    Historical Provider, MD  fluticasone furoate-vilanterol (BREO ELLIPTA) 100-25 MCG/INH AEPB Inhale 1 puff into the lungs at bedtime.    Historical Provider, MD  HYDROcodone-acetaminophen  (NORCO/VICODIN) 5-325 MG tablet Take 1-2 tablets by mouth every 4 (four) hours as needed for moderate pain or severe pain. 03/04/16   Theodis Blaze, MD  hydrocortisone 1 % ointment Apply 1 application topically 2 (two) times daily. 01/08/16   Curt Bears, MD  ipratropium-albuterol (DUONEB) 0.5-2.5 (3) MG/3ML SOLN Take 3 mLs by nebulization 3 (three) times daily. 03/04/16   Theodis Blaze, MD  lidocaine-prilocaine (EMLA) cream Apply 1 application topically as needed (prior to accessing port).    Historical Provider, MD  mirtazapine (REMERON) 30 MG tablet Take 30 mg by mouth at bedtime as needed (for sleep).    Historical Provider, MD  nicotine (NICODERM CQ) 21 mg/24hr patch Place 1 patch (21 mg total) onto the skin daily. 08/15/14   Deneise Lever, MD  predniSONE (DELTASONE) 10 MG tablet Take 50 mg tablet today and taper down by 10 mg daily until completed 03/04/16   Theodis Blaze, MD  prochlorperazine (COMPAZINE) 10 MG tablet Take 1 tablet (10 mg total) by mouth every 6 (six) hours as needed for nausea or vomiting. 01/08/16   Curt Bears, MD  ranitidine (ZANTAC) 150 MG tablet Take 150 mg by mouth 2 (two) times daily.     Historical Provider, MD  rivaroxaban (XARELTO) 20 MG TABS tablet Take 1 tablet (20 mg total) by mouth daily with supper. 02/21/16   Erick Colace, NP  traMADol (ULTRAM) 50 MG tablet Take 50 mg by mouth every 12 (twelve) hours as needed for moderate pain.    Historical Provider, MD    Family History Family History  Problem Relation Age of Onset  . COPD Father     Social History Social History  Substance Use Topics  . Smoking status: Current Some Day Smoker    Packs/day: 0.25    Years: 44.00    Types: Cigarettes  . Smokeless tobacco: Never Used     Comment: 2 cigs a day  . Alcohol use No     Comment: no alcohol in 7 yrs      Allergies   Ativan [lorazepam]   Review of Systems Review of Systems  Constitutional: Negative for activity change and fever.  Respiratory:  Positive for cough, shortness of breath and wheezing.   Cardiovascular: Positive for chest pain. Negative for leg swelling.  Gastrointestinal: Negative for abdominal pain.  All other systems reviewed and are negative.    Physical Exam Updated Vital Signs Pulse 106   Temp 98.6 F (37 C) (Oral)   Resp 23   SpO2 97%   Physical Exam  Constitutional: She is oriented to person, place, and time. She appears well-developed and well-nourished.  Acutely distressed  HENT:  Head: Normocephalic and atraumatic.  Eyes: Right eye exhibits no discharge.  Cardiovascular: Normal rate, regular rhythm and normal heart sounds.   No murmur heard. Pulmonary/Chest: She has wheezes.  Increased work of breathing. Tachypnea. Diffuse wheezing  Abdominal: Soft. She exhibits  no distension. There is no tenderness.  Neurological: She is oriented to person, place, and time.  Skin: Skin is warm and dry. She is not diaphoretic.  Psychiatric: She has a normal mood and affect.  Nursing note and vitals reviewed.    ED Treatments / Results  Labs (all labs ordered are listed, but only abnormal results are displayed) Labs Reviewed  CBC - Abnormal; Notable for the following:       Result Value   RDW 16.9 (*)    All other components within normal limits  BASIC METABOLIC PANEL  BLOOD GAS, VENOUS    EKG  EKG Interpretation None       Radiology No results found.  Procedures Procedures (including critical care time)  Medications Ordered in ED Medications - No data to display   Initial Impression / Assessment and Plan / ED Course  I have reviewed the triage vital signs and the nursing notes.  Pertinent labs & imaging results that were available during my care of the patient were reviewed by me and considered in my medical decision making (see chart for details).  Clinical Course    Patient is a 65 year old female with history of adenocarcinoma, PE on Xarelto, COPD, current smoker presenting  today with shortness of breath the last 3 days. Patient appeared in acute distress on arrival. Patient received sign Medrol, albuterol prior to arrival. Patient received additional treatment here. Patient 98% on home 3 L. However she is increased work of breathing and wheezing. We'll put her on BiPAP currently. Will get VBG, chest x-ray  VBG reassuring, CXR shows pna, will treat as HCAP. Continue on bipap.  Work of breathing and patient's clairty much improved on bipap.  Will admit to stepdown.   CRITICAL CARE Performed by: Gardiner Sleeper Total critical care time: 45 minutes Critical care time was exclusive of separately billable procedures and treating other patients. Critical care was necessary to treat or prevent imminent or life-threatening deterioration. Critical care was time spent personally by me on the following activities: development of treatment plan with patient and/or surrogate as well as nursing, discussions with consultants, evaluation of patient's response to treatment, examination of patient, obtaining history from patient or surrogate, ordering and performing treatments and interventions, ordering and review of laboratory studies, ordering and review of radiographic studies, pulse oximetry and re-evaluation of patient's condition.   Final Clinical Impressions(s) / ED Diagnoses   Final diagnoses:  None    New Prescriptions New Prescriptions   No medications on file     Corinthian Mizrahi Julio Alm, MD 04/02/16 [phone]

## 2016-04-01 NOTE — Telephone Encounter (Signed)
Spoke with pt's daughter [name], states that pt c/o wheezing, heart rate jumping from 100-130 at rest, slurred speech, mid back pain, gasping for air, intermittent confusion, 02 sats on room air at 81% (pt does not have home 02 at this time).  S/s present X2-3 days.  I advised pt's daughter that the pt needs to be evaluated in ED for these multi-faceted symptoms.  Pt's daughter expressed understanding and agreement, states she was calling us since pt was declining initially to go to ED.   Pt's daughter states she will call EMS to have pt taken to ED.  Nothing further needed at this time.

## 2016-04-02 DIAGNOSIS — C801 Malignant (primary) neoplasm, unspecified: Secondary | ICD-10-CM

## 2016-04-02 DIAGNOSIS — J9601 Acute respiratory failure with hypoxia: Secondary | ICD-10-CM

## 2016-04-02 DIAGNOSIS — E876 Hypokalemia: Secondary | ICD-10-CM

## 2016-04-02 LAB — BLOOD GAS, ARTERIAL
ACID-BASE EXCESS: 1.8 mmol/L (ref 0.0–2.0)
Bicarbonate: 27 mmol/L (ref 20.0–28.0)
DRAWN BY: 11249
Delivery systems: POSITIVE
EXPIRATORY PAP: 5
FIO2: 35
Inspiratory PAP: 12
LHR: 15 {breaths}/min
O2 SAT: 93.6 %
PCO2 ART: 47.2 mmHg (ref 32.0–48.0)
PH ART: 7.376 (ref 7.350–7.450)
Patient temperature: 98.6
pO2, Arterial: 72.7 mmHg — ABNORMAL LOW (ref 83.0–108.0)

## 2016-04-02 LAB — COMPREHENSIVE METABOLIC PANEL
ALK PHOS: 115 U/L (ref 38–126)
ALT: 16 U/L (ref 14–54)
ANION GAP: 10 (ref 5–15)
AST: 17 U/L (ref 15–41)
Albumin: 3.6 g/dL (ref 3.5–5.0)
BILIRUBIN TOTAL: 0.5 mg/dL (ref 0.3–1.2)
BUN: 16 mg/dL (ref 6–20)
CALCIUM: 8.7 mg/dL — AB (ref 8.9–10.3)
CO2: 28 mmol/L (ref 22–32)
Chloride: 103 mmol/L (ref 101–111)
Creatinine, Ser: 0.48 mg/dL (ref 0.44–1.00)
GFR calc Af Amer: >60 mL/min (ref >60)
Glucose, Bld: 131 mg/dL — ABNORMAL HIGH (ref 65–99)
POTASSIUM: 3.3 mmol/L — AB (ref 3.5–5.1)
Sodium: 141 mmol/L (ref 135–145)
TOTAL PROTEIN: 7 g/dL (ref 6.5–8.1)

## 2016-04-02 LAB — CBC
HEMATOCRIT: 35.3 % — AB (ref 36.0–46.0)
HEMOGLOBIN: 11.5 g/dL — AB (ref 12.0–15.0)
MCH: 30.3 pg (ref 26.0–34.0)
MCHC: 32.6 g/dL (ref 30.0–36.0)
MCV: 93.1 fL (ref 78.0–100.0)
Platelets: 282 10*3/uL (ref 150–400)
RBC: 3.79 MIL/uL — ABNORMAL LOW (ref 3.87–5.11)
RDW: 16.5 % — AB (ref 11.5–15.5)
WBC: 3.7 10*3/uL — AB (ref 4.0–10.5)

## 2016-04-02 LAB — URINALYSIS, ROUTINE W REFLEX MICROSCOPIC
GLUCOSE, UA: NEGATIVE mg/dL
HGB URINE DIPSTICK: NEGATIVE
Ketones, ur: 40 mg/dL — AB
Leukocytes, UA: NEGATIVE
Nitrite: NEGATIVE
PROTEIN: NEGATIVE mg/dL
Specific Gravity, Urine: 1.034 — ABNORMAL HIGH (ref 1.005–1.030)
pH: 5.5 (ref 5.0–8.0)

## 2016-04-02 LAB — STREP PNEUMONIAE URINARY ANTIGEN: STREP PNEUMO URINARY ANTIGEN: NEGATIVE

## 2016-04-02 MED ORDER — POTASSIUM CHLORIDE CRYS ER 20 MEQ PO TBCR
40.0000 meq | EXTENDED_RELEASE_TABLET | Freq: Once | ORAL | Status: AC
  Administered 2016-04-02: 40 meq via ORAL
  Filled 2016-04-02: qty 2

## 2016-04-02 MED ORDER — IPRATROPIUM-ALBUTEROL 0.5-2.5 (3) MG/3ML IN SOLN
3.0000 mL | Freq: Three times a day (TID) | RESPIRATORY_TRACT | Status: DC
  Administered 2016-04-02 – 2016-04-05 (×7): 3 mL via RESPIRATORY_TRACT
  Filled 2016-04-02 (×7): qty 3

## 2016-04-02 MED ORDER — LEVOFLOXACIN 750 MG PO TABS
750.0000 mg | ORAL_TABLET | Freq: Every day | ORAL | Status: DC
  Administered 2016-04-02 – 2016-04-04 (×3): 750 mg via ORAL
  Filled 2016-04-02 (×3): qty 1

## 2016-04-02 MED ORDER — SODIUM CHLORIDE 0.9% FLUSH
10.0000 mL | INTRAVENOUS | Status: DC | PRN
  Administered 2016-04-03 (×2): 10 mL
  Filled 2016-04-02 (×2): qty 40

## 2016-04-02 MED ORDER — METHYLPREDNISOLONE SODIUM SUCC 40 MG IJ SOLR
40.0000 mg | Freq: Four times a day (QID) | INTRAMUSCULAR | Status: DC
  Administered 2016-04-02 – 2016-04-04 (×9): 40 mg via INTRAVENOUS
  Filled 2016-04-02 (×10): qty 1

## 2016-04-02 NOTE — Progress Notes (Signed)
PROGRESS NOTE    Heather Banks  WFU:[id number] DOB: June 02, 1951 DOA: 04/01/2016 PCP: [name], NP   Outpatient Specialists:    Brief Narrative:  Heather Banks is a 65 y.o. female with medical history significant of adenocarcinoma of the lung and COPD, who comes to the hospital with chief complaint of worsening dyspnea. Patient has been developing worsening dyspnea for last 6 weeks, worse over the last 7 days, it is moderate to severe intensity, has limited her ability to ambulate, has been associated with cough with increased sputum production and wheezing. There is no improving or worsening factors. Currently she continues to smoke cigarettes. For her COPD she is on inhalers but does NOT wear home O2..   Assessment & Plan:   Active Problems:   Respiratory failure ([place])   HCAP (healthcare-associated pneumonia)   Hypoxic respiratory failure. -PRN Bipap -weaned to [place] associated pneumonia. given IV vancomycin and Zosyn -change to levaquin Followup PA and lateral chest radiographs recommended in 3-4 weeks following trial of antibiotic therapy to ensure resolution and exclude underlying malignancy.  COPD exacerbation with h/o SEVERE COPD - DuoNeb's every 6 hours routine and every 4 hours as needed.  -Solu-Medrol     History of pulmonary embolism.  -on xarelto   Depression and anxiety. Continue alprazolam, diazepam and mirtazapine.   Stage IV adenocarcinoma of the lung.  - patient had systemic chemotherapy as well as palliative radiotherapy currently on observation with Dr. Mayme Genta.   Hypokalemia -replete  Tobacco abuse Encourage cessation   DVT prophylaxis:  Fully anticoagulated   Code Status: Partial Code  Family Communication: Fiance at bedside  Disposition Plan:  If stable off Bipap tx to floor   Consultants:     Procedures:        Subjective: Breathing better  Objective: Vitals:   04/02/16 0900 04/02/16 1000 04/02/16  1100 04/02/16 1217  BP: (!) 147/85 138/67 117/76   Pulse: 86 79 82   Resp: '15 15 16   '$ Temp:    98.7 F (37.1 C)  TempSrc:    Oral  SpO2: 92% 94% 91%   Weight:      Height:        Intake/Output Summary (Last 24 hours) at 04/02/16 1242 Last data filed at 04/02/16 2025  Gross per 24 hour  Intake              760 ml  Output              500 ml  Net              260 ml   Filed Weights   04/01/16 1627  Weight: 88.1 kg (194 lb 3.6 oz)    Examination:  General exam: Appears calm and comfortable  Respiratory system: exp wheezing Respiratory effort normal. Cardiovascular system: S1 & S2 heard, RRR. No JVD, murmurs, rubs, gallops or clicks. No pedal edema. Gastrointestinal system: Abdomen is nondistended, soft and nontender. No organomegaly or masses felt. Normal bowel sounds heard. Central nervous system: Alert and oriented. No focal neurological deficits. Extremities: Symmetric 5 x 5 power. Skin: No rashes, lesions or ulcers Psychiatry: Judgement and insight appear normal. Mood & affect appropriate.     Data Reviewed: I have personally reviewed following labs and imaging studies  CBC:  Recent Labs Lab 04/01/16 1314 04/02/16 0338  WBC 5.6 3.7*  HGB 12.7 11.5*  HCT 40.0 35.3*  MCV 95.5 93.1  PLT 337 427   Basic Metabolic Panel:  Recent Labs Lab 04/01/16 1314 04/02/16 0338  NA 142 141  K 3.5 3.3*  CL 106 103  CO2 25 28  GLUCOSE 191* 131*  BUN 14 16  CREATININE 0.70 0.48  CALCIUM 8.6* 8.7*   GFR: Estimated Creatinine Clearance: 77.4 mL/min (by C-G formula based on SCr of 0.8 mg/dL). Liver Function Tests:  Recent Labs Lab 04/02/16 0338  AST 17  ALT 16  ALKPHOS 115  BILITOT 0.5  PROT 7.0  ALBUMIN 3.6   No results for input(s): LIPASE, AMYLASE in the last 168 hours. No results for input(s): AMMONIA in the last 168 hours. Coagulation Profile: No results for input(s): INR, PROTIME in the last 168 hours. Cardiac Enzymes: No results for input(s):  CKTOTAL, CKMB, CKMBINDEX, TROPONINI in the last 168 hours. BNP (last 3 results) No results for input(s): PROBNP in the last 8760 hours. HbA1C: No results for input(s): HGBA1C in the last 72 hours. CBG: No results for input(s): GLUCAP in the last 168 hours. Lipid Profile: No results for input(s): CHOL, HDL, LDLCALC, TRIG, CHOLHDL, LDLDIRECT in the last 72 hours. Thyroid Function Tests: No results for input(s): TSH, T4TOTAL, FREET4, T3FREE, THYROIDAB in the last 72 hours. Anemia Panel: No results for input(s): VITAMINB12, FOLATE, FERRITIN, TIBC, IRON, RETICCTPCT in the last 72 hours. Urine analysis:    Component Value Date/Time   COLORURINE YELLOW 04/02/2016 0036   APPEARANCEUR CLEAR 04/02/2016 0036   LABSPEC 1.034 (H) 04/02/2016 0036   PHURINE 5.5 04/02/2016 0036   GLUCOSEU NEGATIVE 04/02/2016 0036   HGBUR NEGATIVE 04/02/2016 0036   BILIRUBINUR SMALL (A) 04/02/2016 0036   KETONESUR 40 (A) 04/02/2016 0036   PROTEINUR NEGATIVE 04/02/2016 0036   NITRITE NEGATIVE 04/02/2016 0036   LEUKOCYTESUR NEGATIVE 04/02/2016 0036     ) Recent Results (from the past 240 hour(s))  MRSA PCR Screening     Status: None   Collection Time: 04/01/16  3:53 PM  Result Value Ref Range Status   MRSA by PCR NEGATIVE NEGATIVE Final    Comment:        The GeneXpert MRSA Assay (FDA approved for NASAL specimens only), is one component of a comprehensive MRSA colonization surveillance program. It is not intended to diagnose MRSA infection nor to guide or monitor treatment for MRSA infections.   Culture, blood (routine x 2) Call MD if unable to obtain prior to antibiotics being given     Status: None (Preliminary result)   Collection Time: 04/01/16  5:28 PM  Result Value Ref Range Status   Specimen Description   Final    BLOOD LEFT THUMB Performed at [facility]    Special Requests BOTTLES DRAWN AEROBIC AND ANAEROBIC [facility]  Final   Culture PENDING  Incomplete   Report Status PENDING   Incomplete      Anti-infectives    Start     Dose/Rate Route Frequency Ordered Stop   04/02/16 1245  levofloxacin (LEVAQUIN) tablet 750 mg     750 mg Oral Daily 04/02/16 1241     04/02/16 0400  vancomycin (VANCOCIN) IVPB 1000 mg/200 mL premix  Status:  Discontinued     1,000 mg 200 mL/hr over 60 Minutes Intravenous Every 12 hours 04/01/16 1735 04/02/16 1241   04/01/16 2359  vancomycin (VANCOCIN) IVPB 1000 mg/200 mL premix  Status:  Discontinued     1,000 mg 200 mL/hr over 60 Minutes Intravenous Every 12 hours 04/01/16 1447 04/01/16 1621   04/01/16 2200  piperacillin-tazobactam (ZOSYN) IVPB 3.375 g  Status:  Discontinued     3.375 g 12.5 mL/hr over 240 Minutes Intravenous Every 8 hours 04/01/16 1447 04/01/16 1621   04/01/16 2100  piperacillin-tazobactam (ZOSYN) IVPB 3.375 g  Status:  Discontinued     3.375 g 12.5 mL/hr over 240 Minutes Intravenous Every 8 hours 04/01/16 1735 04/02/16 1241   04/01/16 1800  cefTRIAXone (ROCEPHIN) 1 g in dextrose 5 % 50 mL IVPB  Status:  Discontinued     1 g 100 mL/hr over 30 Minutes Intravenous Every 24 hours 04/01/16 1621 04/01/16 1729   04/01/16 1700  azithromycin (ZITHROMAX) 500 mg in dextrose 5 % 250 mL IVPB  Status:  Discontinued     500 mg 250 mL/hr over 60 Minutes Intravenous Every 24 hours 04/01/16 1621 04/01/16 1729   04/01/16 1500  vancomycin (VANCOCIN) IVPB 1000 mg/200 mL premix     1,000 mg 200 mL/hr over 60 Minutes Intravenous  Once 04/01/16 1428 04/01/16 1644   04/01/16 1430  piperacillin-tazobactam (ZOSYN) IVPB 3.375 g     3.375 g 100 mL/hr over 30 Minutes Intravenous  Once 04/01/16 1426 04/01/16 1529       Radiology Studies: Dg Chest Portable 1 View  Result Date: 04/01/2016 CLINICAL DATA:  Wheezing and tachycardia EXAM: PORTABLE CHEST 1 VIEW COMPARISON:  February 27, 2016 FINDINGS: There is patchy interstitial prominence and airspace consolidation in the right base. There is also subtle opacity in the left upper lobe, new from prior  study. The lungs elsewhere are clear. The heart size and pulmonary vascularity are normal. No adenopathy. Port-A-Cath tip is at the cavoatrial junction. No pneumothorax. No bone lesions. IMPRESSION: Patchy infiltrate right base consistent with pneumonia. Questions second focus of early pneumonia left upper lobe. Lungs elsewhere clear. Stable cardiac silhouette. No pneumothorax. Followup PA and lateral chest radiographs recommended in 3-4 weeks following trial of antibiotic therapy to ensure resolution and exclude underlying malignancy. Electronically Signed   By: Lowella Grip III M.D.   On: 04/01/2016 14:04        Scheduled Meds: . chlorhexidine  15 mL Mouth Rinse BID  . famotidine  20 mg Oral Daily  . ipratropium-albuterol  3 mL Nebulization Q6H  . levofloxacin  750 mg Oral Daily  . mouth rinse  15 mL Mouth Rinse q12n4p  . methylPREDNISolone (SOLU-MEDROL) injection  40 mg Intravenous Q6H  . rivaroxaban  20 mg Oral Q supper  . sodium chloride flush  3 mL Intravenous Q12H  . sodium chloride flush  3 mL Intravenous Q12H   Continuous Infusions:    LOS: 1 day    Time spent: 35 min    [place], DO [facility] Pager [phone]  If 7PM-7AM, please contact night-coverage [email or web address] Password [email or web address] 04/02/2016, 12:42 PM

## 2016-04-03 LAB — URINE CULTURE: CULTURE: NO GROWTH

## 2016-04-03 LAB — BASIC METABOLIC PANEL
ANION GAP: 7 (ref 5–15)
BUN: 15 mg/dL (ref 6–20)
CALCIUM: 9 mg/dL (ref 8.9–10.3)
CO2: 31 mmol/L (ref 22–32)
CREATININE: 0.59 mg/dL (ref 0.44–1.00)
Chloride: 102 mmol/L (ref 101–111)
GFR calc Af Amer: >60 mL/min (ref >60)
GLUCOSE: 136 mg/dL — AB (ref 65–99)
Potassium: 3.9 mmol/L (ref 3.5–5.1)
Sodium: 140 mmol/L (ref 135–145)

## 2016-04-03 LAB — LEGIONELLA PNEUMOPHILA SEROGP 1 UR AG: L. PNEUMOPHILA SEROGP 1 UR AG: NEGATIVE

## 2016-04-03 LAB — CBC
HCT: 34.9 % — ABNORMAL LOW (ref 36.0–46.0)
Hemoglobin: 11.3 g/dL — ABNORMAL LOW (ref 12.0–15.0)
MCH: 30.1 pg (ref 26.0–34.0)
MCHC: 32.4 g/dL (ref 30.0–36.0)
MCV: 93.1 fL (ref 78.0–100.0)
Platelets: 286 K/uL (ref 150–400)
RBC: 3.75 MIL/uL — ABNORMAL LOW (ref 3.87–5.11)
RDW: 16.3 % — ABNORMAL HIGH (ref 11.5–15.5)
WBC: 9.9 K/uL (ref 4.0–10.5)

## 2016-04-03 MED ORDER — GUAIFENESIN 100 MG/5ML PO SOLN
5.0000 mL | ORAL | Status: DC | PRN
  Administered 2016-04-03: 100 mg via ORAL
  Filled 2016-04-03: qty 10

## 2016-04-03 NOTE — Progress Notes (Signed)
PROGRESS NOTE    Heather Banks  ZOX:[id number] DOB: April 03, 1951 DOA: 04/01/2016 PCP: [name], NP   Outpatient Specialists:    Brief Narrative:  Heather Banks is a 65 y.o. female with medical history significant of adenocarcinoma of the lung and COPD, who comes to the hospital with chief complaint of worsening dyspnea. Patient has been developing worsening dyspnea for last 6 weeks, worse over the last 7 days, it is moderate to severe intensity, has limited her ability to ambulate, has been associated with cough with increased sputum production and wheezing. There is no improving or worsening factors. Currently she continues to smoke cigarettes. For her COPD she is on inhalers but does NOT wear home O2..   Assessment & Plan:   Active Problems:   Respiratory failure ([place])   HCAP (healthcare-associated pneumonia)   Hypoxic respiratory failure. -PRN Bipap -weaned to [place]-- not on at home  Healthcare associated pneumonia. given IV vancomycin and Zosyn -changed to levaquin Followup PA and lateral chest radiographs recommended in 3-4 weeks following trial of antibiotic therapy to ensure resolution and exclude underlying malignancy.  COPD exacerbation with h/o SEVERE COPD - DuoNeb's every 6 hours routine and every 4 hours as needed.  -Solu-Medrol    History of pulmonary embolism.  -on xarelto   Depression and anxiety. Continue alprazolam, diazepam and mirtazapine.   Stage IV adenocarcinoma of the lung.  - patient had systemic chemotherapy as well as palliative radiotherapy currently on observation with Dr. Mayme Genta.   Hypokalemia -repleted  Tobacco abuse Encourage cessation   DVT prophylaxis:  Fully anticoagulated   Code Status: Partial Code  Family Communication: No family at bedside  Disposition Plan:  If stable off Bipap tx to floor   Consultants:     Procedures:        Subjective: Breathing better  Objective: Vitals:   04/02/16 1520  04/02/16 2040 04/02/16 2122 04/03/16 0523  BP: 120/73  130/74 (!) 147/82  Pulse: 95  83 73  Resp: 18  (!) 22 20  Temp: 98 F (36.7 C)  98.6 F (37 C) 98.5 F (36.9 C)  TempSrc: Axillary  Oral Oral  SpO2: 91% 92% 94% 96%  Weight:      Height:        Intake/Output Summary (Last 24 hours) at 04/03/16 1129 Last data filed at 04/03/16 0600  Gross per 24 hour  Intake               50 ml  Output                0 ml  Net               50 ml   Filed Weights   04/01/16 1627  Weight: 88.1 kg (194 lb 3.6 oz)    Examination:  General exam: Appears calm and comfortable - 3L Respiratory system: exp wheezing Respiratory effort normal. Cardiovascular system: S1 & S2 heard, RRR. No JVD, murmurs, rubs, gallops or clicks. No pedal edema. Gastrointestinal system: Abdomen is nondistended, soft and nontender. No organomegaly or masses felt. Normal bowel sounds heard. Central nervous system: Alert and oriented. No focal neurological deficits.     Data Reviewed: I have personally reviewed following labs and imaging studies  CBC:  Recent Labs Lab 04/01/16 1314 04/02/16 0338 04/03/16 0440  WBC 5.6 3.7* 9.9  HGB 12.7 11.5* 11.3*  HCT 40.0 35.3* 34.9*  MCV 95.5 93.1 93.1  PLT 337 282 811   Basic Metabolic Panel:  Recent Labs Lab 04/01/16 1314 04/02/16 0338 04/03/16 0440  NA 142 141 140  K 3.5 3.3* 3.9  CL 106 103 102  CO2 '25 28 31  '$ GLUCOSE 191* 131* 136*  BUN '14 16 15  '$ CREATININE 0.70 0.48 0.59  CALCIUM 8.6* 8.7* 9.0   GFR: Estimated Creatinine Clearance: 77.4 mL/min (by C-G formula based on SCr of 0.8 mg/dL). Liver Function Tests:  Recent Labs Lab 04/02/16 0338  AST 17  ALT 16  ALKPHOS 115  BILITOT 0.5  PROT 7.0  ALBUMIN 3.6   No results for input(s): LIPASE, AMYLASE in the last 168 hours. No results for input(s): AMMONIA in the last 168 hours. Coagulation Profile: No results for input(s): INR, PROTIME in the last 168 hours. Cardiac Enzymes: No results for  input(s): CKTOTAL, CKMB, CKMBINDEX, TROPONINI in the last 168 hours. BNP (last 3 results) No results for input(s): PROBNP in the last 8760 hours. HbA1C: No results for input(s): HGBA1C in the last 72 hours. CBG: No results for input(s): GLUCAP in the last 168 hours. Lipid Profile: No results for input(s): CHOL, HDL, LDLCALC, TRIG, CHOLHDL, LDLDIRECT in the last 72 hours. Thyroid Function Tests: No results for input(s): TSH, T4TOTAL, FREET4, T3FREE, THYROIDAB in the last 72 hours. Anemia Panel: No results for input(s): VITAMINB12, FOLATE, FERRITIN, TIBC, IRON, RETICCTPCT in the last 72 hours. Urine analysis:    Component Value Date/Time   COLORURINE YELLOW 04/02/2016 0036   APPEARANCEUR CLEAR 04/02/2016 0036   LABSPEC 1.034 (H) 04/02/2016 0036   PHURINE 5.5 04/02/2016 0036   GLUCOSEU NEGATIVE 04/02/2016 0036   HGBUR NEGATIVE 04/02/2016 0036   BILIRUBINUR SMALL (A) 04/02/2016 0036   KETONESUR 40 (A) 04/02/2016 0036   PROTEINUR NEGATIVE 04/02/2016 0036   NITRITE NEGATIVE 04/02/2016 0036   LEUKOCYTESUR NEGATIVE 04/02/2016 0036      Recent Results (from the past 240 hour(s))  MRSA PCR Screening     Status: None   Collection Time: 04/01/16  3:53 PM  Result Value Ref Range Status   MRSA by PCR NEGATIVE NEGATIVE Final    Comment:        The GeneXpert MRSA Assay (FDA approved for NASAL specimens only), is one component of a comprehensive MRSA colonization surveillance program. It is not intended to diagnose MRSA infection nor to guide or monitor treatment for MRSA infections.   Culture, blood (routine x 2) Call MD if unable to obtain prior to antibiotics being given     Status: None (Preliminary result)   Collection Time: 04/01/16  5:28 PM  Result Value Ref Range Status   Specimen Description BLOOD RIGHT ARM  Final   Special Requests BOTTLES DRAWN AEROBIC AND ANAEROBIC 10CC EACH  Final   Culture   Final    NO GROWTH < 24 HOURS Performed at [facility]    Report  Status PENDING  Incomplete  Culture, blood (routine x 2) Call MD if unable to obtain prior to antibiotics being given     Status: None (Preliminary result)   Collection Time: 04/01/16  5:28 PM  Result Value Ref Range Status   Specimen Description BLOOD LEFT THUMB  Final   Special Requests BOTTLES DRAWN AEROBIC AND ANAEROBIC 5CC EACH  Final   Culture   Final    NO GROWTH < 24 HOURS Performed at [facility]    Report Status PENDING  Incomplete  Urine culture     Status: None   Collection Time: 04/02/16 12:36 AM  Result Value Ref Range  Status   Specimen Description URINE, CLEAN CATCH  Final   Special Requests NONE  Final   Culture NO GROWTH Performed at [facility]   Final   Report Status 04/03/2016 FINAL  Final      Anti-infectives    Start     Dose/Rate Route Frequency Ordered Stop   04/02/16 1800  levofloxacin (LEVAQUIN) tablet 750 mg     750 mg Oral Daily-1800 04/02/16 1241     04/02/16 0400  vancomycin (VANCOCIN) IVPB 1000 mg/200 mL premix  Status:  Discontinued     1,000 mg 200 mL/hr over 60 Minutes Intravenous Every 12 hours 04/01/16 1735 04/02/16 1241   04/01/16 2359  vancomycin (VANCOCIN) IVPB 1000 mg/200 mL premix  Status:  Discontinued     1,000 mg 200 mL/hr over 60 Minutes Intravenous Every 12 hours 04/01/16 1447 04/01/16 1621   04/01/16 2200  piperacillin-tazobactam (ZOSYN) IVPB 3.375 g  Status:  Discontinued     3.375 g 12.5 mL/hr over 240 Minutes Intravenous Every 8 hours 04/01/16 1447 04/01/16 1621   04/01/16 2100  piperacillin-tazobactam (ZOSYN) IVPB 3.375 g  Status:  Discontinued     3.375 g 12.5 mL/hr over 240 Minutes Intravenous Every 8 hours 04/01/16 1735 04/02/16 1241   04/01/16 1800  cefTRIAXone (ROCEPHIN) 1 g in dextrose 5 % 50 mL IVPB  Status:  Discontinued     1 g 100 mL/hr over 30 Minutes Intravenous Every 24 hours 04/01/16 1621 04/01/16 1729   04/01/16 1700  azithromycin (ZITHROMAX) 500 mg in dextrose 5 % 250 mL IVPB  Status:   Discontinued     500 mg 250 mL/hr over 60 Minutes Intravenous Every 24 hours 04/01/16 1621 04/01/16 1729   04/01/16 1500  vancomycin (VANCOCIN) IVPB 1000 mg/200 mL premix     1,000 mg 200 mL/hr over 60 Minutes Intravenous  Once 04/01/16 1428 04/01/16 1644   04/01/16 1430  piperacillin-tazobactam (ZOSYN) IVPB 3.375 g     3.375 g 100 mL/hr over 30 Minutes Intravenous  Once 04/01/16 1426 04/01/16 1529       Radiology Studies: Dg Chest Portable 1 View  Result Date: 04/01/2016 CLINICAL DATA:  Wheezing and tachycardia EXAM: PORTABLE CHEST 1 VIEW COMPARISON:  February 27, 2016 FINDINGS: There is patchy interstitial prominence and airspace consolidation in the right base. There is also subtle opacity in the left upper lobe, new from prior study. The lungs elsewhere are clear. The heart size and pulmonary vascularity are normal. No adenopathy. Port-A-Cath tip is at the cavoatrial junction. No pneumothorax. No bone lesions. IMPRESSION: Patchy infiltrate right base consistent with pneumonia. Questions second focus of early pneumonia left upper lobe. Lungs elsewhere clear. Stable cardiac silhouette. No pneumothorax. Followup PA and lateral chest radiographs recommended in 3-4 weeks following trial of antibiotic therapy to ensure resolution and exclude underlying malignancy. Electronically Signed   By: Lowella Grip III M.D.   On: 04/01/2016 14:04        Scheduled Meds: . chlorhexidine  15 mL Mouth Rinse BID  . famotidine  20 mg Oral Daily  . ipratropium-albuterol  3 mL Nebulization TID  . levofloxacin  750 mg Oral q1800  . mouth rinse  15 mL Mouth Rinse q12n4p  . methylPREDNISolone (SOLU-MEDROL) injection  40 mg Intravenous Q6H  . rivaroxaban  20 mg Oral Q supper  . sodium chloride flush  3 mL Intravenous Q12H  . sodium chloride flush  3 mL Intravenous Q12H   Continuous Infusions:    LOS: 2  days    Time spent: 25 min    [place], DO [facility] Pager [phone]  If  7PM-7AM, please contact night-coverage [email or web address] Password [email or web address] 04/03/2016, 11:29 AM

## 2016-04-04 MED ORDER — PREDNISONE 20 MG PO TABS
40.0000 mg | ORAL_TABLET | Freq: Every day | ORAL | Status: DC
  Administered 2016-04-05: 40 mg via ORAL
  Filled 2016-04-04: qty 2

## 2016-04-04 MED ORDER — METHYLPREDNISOLONE SODIUM SUCC 40 MG IJ SOLR
40.0000 mg | Freq: Four times a day (QID) | INTRAMUSCULAR | Status: AC
  Administered 2016-04-04 – 2016-04-05 (×2): 40 mg via INTRAVENOUS
  Filled 2016-04-04 (×2): qty 1

## 2016-04-04 NOTE — Progress Notes (Signed)
[facility] PROGRESS NOTE  Heather Banks JOA:[id number] DOB: June 24, 1951 DOA: 04/01/2016 PCP: [name], NP  Active Problems:   Respiratory failure ([place])   HCAP (healthcare-associated pneumonia)   Brief summary   65 y.o.femalewith medical history significant of adenocarcinoma of the lung and COPD,who comes to the hospital with chief complaint of worsening dyspnea. Patient has been developing worsening dyspnea for last 6 weeks, worse over the last 7 days, it is moderate to severe intensity, has limited her ability to ambulate, has been associated with cough with increased sputum production and wheezing. There is no improving or worsening factors. Currently she continues to smoke cigarettes.For her COPD she is on inhalers but does NOT wear home O2.. -admitted with hcap and copd exacerbation   Assessment/Plan:  Healthcare associated pneumonia. Patient received IV vancomycin and Zosyn. She is improving, afebrile, blood cultures: ngtd. antibiotics changed to Levaquin -f/u PA and lateral chest radiographs recommended in 3-4 weeks following trial of antibiotic therapy to ensure resolution and exclude underlying malignancy.  COPD exacerbation with h/o SEVERE COPD+pneumonia. Improving with  DuoNeb's every 6 hours routine and every 4 hours as needed. Solu-Medrol will transition to oral prednisone today  Hypoxic respiratory failure. Due to copd exacerbation and pneumonia -off Bipap. weaned to [facility]. not on at home   History of pulmonary embolism. on xarelto   Depression and anxiety. Continue alprazolam, diazepam and mirtazapine.   Stage IV adenocarcinoma of the lung. patient had systemic chemotherapy as well as palliative radiotherapy currently on observation with Dr. Mayme Genta.   Hypokalemia repleted  Code Status: partial  Family Communication: d/w patient, rn (indicate person spoken with, relationship, and if by phone, the number) Disposition Plan: home 24-48 hrs     Consultants:  none  Procedures:  none  Antibiotics: Antibiotics Given (last 72 hours)    Date/Time Action Medication Dose Rate   04/01/16 1711 Given   azithromycin (ZITHROMAX) 500 mg in dextrose 5 % 250 mL IVPB 500 mg 250 mL/hr   04/01/16 2119 Given  [Confirmed readiness to use with IV nurse]   piperacillin-tazobactam (ZOSYN) IVPB 3.375 g 3.375 g 12.5 mL/hr   04/02/16 0500 Given   vancomycin (VANCOCIN) IVPB 1000 mg/200 mL premix 1,000 mg 200 mL/hr   04/02/16 0512 Given   piperacillin-tazobactam (ZOSYN) IVPB 3.375 g 3.375 g 12.5 mL/hr   04/02/16 1220 Given   piperacillin-tazobactam (ZOSYN) IVPB 3.375 g 3.375 g 12.5 mL/hr   04/02/16 1713 Given   levofloxacin (LEVAQUIN) tablet 750 mg 750 mg    04/03/16 1715 Given   levofloxacin (LEVAQUIN) tablet 750 mg 750 mg         (indicate start date, and stop date if known)  HPI/Subjective: Alert, reports feeling better. Has mild productive cough, no acute chest pains   Objective: Vitals:   04/04/16 1000 04/04/16 1400  BP: (!) 143/78 (!) 148/74  Pulse: 71 64  Resp:  18  Temp: 97.5 F (36.4 C) 97.9 F (36.6 C)    Intake/Output Summary (Last 24 hours) at 04/04/16 1417 Last data filed at 04/04/16 1000  Gross per 24 hour  Intake              483 ml  Output                0 ml  Net              483 ml   Filed Weights   04/01/16 1627  Weight: 88.1 kg (194 lb 3.6 oz)  Exam:   General:  Comfortable   Cardiovascular: s1,s2 rrr  Respiratory: Poor ventilation LL  Abdomen: soft, nt, nd   Musculoskeletal: no leg edema    Data Reviewed: Basic Metabolic Panel:  Recent Labs Lab 04/01/16 1314 04/02/16 0338 04/03/16 0440  NA 142 141 140  K 3.5 3.3* 3.9  CL 106 103 102  CO2 '25 28 31  '$ GLUCOSE 191* 131* 136*  BUN '14 16 15  '$ CREATININE 0.70 0.48 0.59  CALCIUM 8.6* 8.7* 9.0   Liver Function Tests:  Recent Labs Lab 04/02/16 0338  AST 17  ALT 16  ALKPHOS 115  BILITOT 0.5  PROT 7.0  ALBUMIN 3.6   No  results for input(s): LIPASE, AMYLASE in the last 168 hours. No results for input(s): AMMONIA in the last 168 hours. CBC:  Recent Labs Lab 04/01/16 1314 04/02/16 0338 04/03/16 0440  WBC 5.6 3.7* 9.9  HGB 12.7 11.5* 11.3*  HCT 40.0 35.3* 34.9*  MCV 95.5 93.1 93.1  PLT 337 282 286   Cardiac Enzymes: No results for input(s): CKTOTAL, CKMB, CKMBINDEX, TROPONINI in the last 168 hours. BNP (last 3 results)  Recent Labs  01/18/16 1715  BNP 85.2    ProBNP (last 3 results) No results for input(s): PROBNP in the last 8760 hours.  CBG: No results for input(s): GLUCAP in the last 168 hours.  Recent Results (from the past 240 hour(s))  MRSA PCR Screening     Status: None   Collection Time: 04/01/16  3:53 PM  Result Value Ref Range Status   MRSA by PCR NEGATIVE NEGATIVE Final    Comment:        The GeneXpert MRSA Assay (FDA approved for NASAL specimens only), is one component of a comprehensive MRSA colonization surveillance program. It is not intended to diagnose MRSA infection nor to guide or monitor treatment for MRSA infections.   Culture, blood (routine x 2) Call MD if unable to obtain prior to antibiotics being given     Status: None (Preliminary result)   Collection Time: 04/01/16  5:28 PM  Result Value Ref Range Status   Specimen Description BLOOD RIGHT ARM  Final   Special Requests BOTTLES DRAWN AEROBIC AND ANAEROBIC 10CC EACH  Final   Culture   Final    NO GROWTH 3 DAYS Performed at [facility]    Report Status PENDING  Incomplete  Culture, blood (routine x 2) Call MD if unable to obtain prior to antibiotics being given     Status: None (Preliminary result)   Collection Time: 04/01/16  5:28 PM  Result Value Ref Range Status   Specimen Description BLOOD LEFT THUMB  Final   Special Requests BOTTLES DRAWN AEROBIC AND ANAEROBIC 5CC EACH  Final   Culture   Final    NO GROWTH 3 DAYS Performed at [facility]    Report Status PENDING  Incomplete   Urine culture     Status: None   Collection Time: 04/02/16 12:36 AM  Result Value Ref Range Status   Specimen Description URINE, CLEAN CATCH  Final   Special Requests NONE  Final   Culture NO GROWTH Performed at [facility]   Final   Report Status 04/03/2016 FINAL  Final     Studies: No results found.  Scheduled Meds: . chlorhexidine  15 mL Mouth Rinse BID  . famotidine  20 mg Oral Daily  . ipratropium-albuterol  3 mL Nebulization TID  . levofloxacin  750 mg Oral q1800  .  mouth rinse  15 mL Mouth Rinse q12n4p  . methylPREDNISolone (SOLU-MEDROL) injection  40 mg Intravenous Q6H  . rivaroxaban  20 mg Oral Q supper  . sodium chloride flush  3 mL Intravenous Q12H  . sodium chloride flush  3 mL Intravenous Q12H   Continuous Infusions:   Active Problems:   Respiratory failure ([place])   HCAP (healthcare-associated pneumonia)    Time spent: >35 minutes     Kinnie Feil  [facility] Pager [phone]. If 7PM-7AM, please contact night-coverage at [email or web address], password [facility] 04/04/2016, 2:17 PM  LOS: 3 days

## 2016-04-04 NOTE — Progress Notes (Signed)
SATURATION QUALIFICATIONS: (This note is used to comply with regulatory documentation for home oxygen)  Patient Saturations on Room Air at Rest = 97%  Patient Saturations on Room Air while Ambulating = 90%  Patient Saturations on 2 Liters of oxygen while Ambulating = 98%  Please briefly explain why patient needs home oxygen:

## 2016-04-04 NOTE — Progress Notes (Signed)
[id number]/FMBBUY Tadarrius Burch,BSN,RN3,CCM: Chart reviewed for needs and los, will follow for discharge Planning. [phone]

## 2016-04-05 DIAGNOSIS — Z720 Tobacco use: Secondary | ICD-10-CM

## 2016-04-05 MED ORDER — LEVOFLOXACIN 750 MG PO TABS: 750.0000 mg | ORAL_TABLET | Freq: Every day | ORAL | 0 refills | Status: DC

## 2016-04-05 MED ORDER — PREDNISONE 10 MG PO TABS: ORAL_TABLET | ORAL | 0 refills | Status: DC

## 2016-04-05 MED ORDER — HEPARIN SOD (PORK) LOCK FLUSH 100 UNIT/ML IV SOLN
500.0000 [IU] | INTRAVENOUS | Status: AC | PRN
  Administered 2016-04-05: 500 [IU]

## 2016-04-05 MED ORDER — ALBUTEROL SULFATE HFA 108 (90 BASE) MCG/ACT IN AERS: 2.0000 | INHALATION_SPRAY | Freq: Four times a day (QID) | RESPIRATORY_TRACT | 0 refills | Status: DC | PRN

## 2016-04-05 MED ORDER — PREDNISONE 10 MG PO TABS
ORAL_TABLET | ORAL | 0 refills | Status: DC
Start: 2016-04-05 — End: 2016-04-05

## 2016-04-05 MED ORDER — HYDROCODONE-ACETAMINOPHEN 5-325 MG PO TABS: 1.0000 | ORAL_TABLET | ORAL | 0 refills | Status: DC | PRN

## 2016-04-05 NOTE — Discharge Summary (Signed)
Physician Discharge Summary  Heather Banks [id number]:[id number] DOB: 11-17-1950 DOA: 04/01/2016  PCP: [name], NP  Admit date: 04/01/2016 Discharge date: 04/05/2016   Recommendations for Outpatient Follow-Up:   Stop smoking f/u PA and lateral chest radiographs recommended in 3-4 weeks following trial of antibiotic therapy to ensure resolution and exclude underlying malignancy.   Discharge Diagnosis:   Active Problems:   Respiratory failure ([place])   HCAP (healthcare-associated pneumonia)   Discharge disposition:  Home  Discharge Condition: Improved.  Diet recommendation: Low sodium, heart healthy  Wound care: None.   History of Present Illness:   Heather Banks is a 65 y.o. female with medical history significant of adenocarcinoma of the lung and COPD, who comes to the hospital with chief complaint of worsening dyspnea. Patient has been developing worsening dyspnea for last 6 weeks, worse over the last 7 days, it is moderate to severe intensity, has limited her ability to ambulate, has been associated with cough with increased sputum production and wheezing. There is no improving or worsening factors. Currently she continues to smoke cigarettes. For her COPD she is on inhalers and supplemental oxygen about 3 L.  At home she has inhalers for her COPD.    Hospital Course by Problem:   Healthcare associated pneumonia. Patient received IV vancomycin and Zosyn. She is improving, afebrile, blood cultures: ngtd. antibiotics changed to Levaquin -f/u PA and lateral chest radiographs recommended in 3-4 weeks following trial of antibiotic therapy to ensure resolution and exclude underlying malignancy.  COPD exacerbation with h/o SEVERE COPD+pneumonia. Improving with  DuoNeb's every 6 hours routine and every 4 hours as needed. Solu-Medrol will transition to oral prednisone for taper at home  Hypoxic respiratory failure. Due to copd exacerbation and pneumonia -off Bipap. weaned to [facility].  not on at home  History of pulmonary embolism. on xarelto  Depression and anxiety. Continue alprazolam, diazepam and mirtazapine.   Stage IV adenocarcinoma of the lung. patient had systemic chemotherapy as well as palliative radiotherapy currently on observation with Dr. Mayme Genta.   Hypokalemia repleted     Medical Consultants:   none   Discharge Exam:   Vitals:   04/04/16 2032 04/05/16 0525  BP: (!) 148/82 (!) 149/72  Pulse: 75 60  Resp: 18 18  Temp: 98.4 F (36.9 C) 98.1 F (36.7 C)   Vitals:   04/04/16 2032 04/04/16 2243 04/05/16 0525 04/05/16 0803  BP: (!) 148/82  (!) 149/72   Pulse: 75  60   Resp: 18  18   Temp: 98.4 F (36.9 C)  98.1 F (36.7 C)   TempSrc: Oral  Oral   SpO2: 93% 97% 94% 92%  Weight:      Height:        Gen:  NAD- feeling much better and wanting to go home   The results of significant diagnostics from this hospitalization (including imaging, microbiology, ancillary and laboratory) are listed below for reference.     Procedures and Diagnostic Studies:   Dg Chest Portable 1 View  Result Date: 04/01/2016 CLINICAL DATA:  Wheezing and tachycardia EXAM: PORTABLE CHEST 1 VIEW COMPARISON:  February 27, 2016 FINDINGS: There is patchy interstitial prominence and airspace consolidation in the right base. There is also subtle opacity in the left upper lobe, new from prior study. The lungs elsewhere are clear. The heart size and pulmonary vascularity are normal. No adenopathy. Port-A-Cath tip is at the cavoatrial junction. No pneumothorax. No bone lesions. IMPRESSION: Patchy infiltrate right base consistent with pneumonia. Questions  second focus of early pneumonia left upper lobe. Lungs elsewhere clear. Stable cardiac silhouette. No pneumothorax. Followup PA and lateral chest radiographs recommended in 3-4 weeks following trial of antibiotic therapy to ensure resolution and exclude underlying malignancy. Electronically Signed   By: Lowella Grip  III M.D.   On: 04/01/2016 14:04     Labs:   Basic Metabolic Panel:  Recent Labs Lab 04/01/16 1314 04/02/16 0338 04/03/16 0440  NA 142 141 140  K 3.5 3.3* 3.9  CL 106 103 102  CO2 '25 28 31  '$ GLUCOSE 191* 131* 136*  BUN '14 16 15  '$ CREATININE 0.70 0.48 0.59  CALCIUM 8.6* 8.7* 9.0   GFR Estimated Creatinine Clearance: 77.4 mL/min (by C-G formula based on SCr of 0.8 mg/dL). Liver Function Tests:  Recent Labs Lab 04/02/16 0338  AST 17  ALT 16  ALKPHOS 115  BILITOT 0.5  PROT 7.0  ALBUMIN 3.6   No results for input(s): LIPASE, AMYLASE in the last 168 hours. No results for input(s): AMMONIA in the last 168 hours. Coagulation profile No results for input(s): INR, PROTIME in the last 168 hours.  CBC:  Recent Labs Lab 04/01/16 1314 04/02/16 0338 04/03/16 0440  WBC 5.6 3.7* 9.9  HGB 12.7 11.5* 11.3*  HCT 40.0 35.3* 34.9*  MCV 95.5 93.1 93.1  PLT 337 282 286   Cardiac Enzymes: No results for input(s): CKTOTAL, CKMB, CKMBINDEX, TROPONINI in the last 168 hours. BNP: Invalid input(s): POCBNP CBG: No results for input(s): GLUCAP in the last 168 hours. D-Dimer No results for input(s): DDIMER in the last 72 hours. Hgb A1c No results for input(s): HGBA1C in the last 72 hours. Lipid Profile No results for input(s): CHOL, HDL, LDLCALC, TRIG, CHOLHDL, LDLDIRECT in the last 72 hours. Thyroid function studies No results for input(s): TSH, T4TOTAL, T3FREE, THYROIDAB in the last 72 hours.  Invalid input(s): FREET3 Anemia work up No results for input(s): VITAMINB12, FOLATE, FERRITIN, TIBC, IRON, RETICCTPCT in the last 72 hours. Microbiology Recent Results (from the past 240 hour(s))  MRSA PCR Screening     Status: None   Collection Time: 04/01/16  3:53 PM  Result Value Ref Range Status   MRSA by PCR NEGATIVE NEGATIVE Final    Comment:        The GeneXpert MRSA Assay (FDA approved for NASAL specimens only), is one component of a comprehensive MRSA  colonization surveillance program. It is not intended to diagnose MRSA infection nor to guide or monitor treatment for MRSA infections.   Culture, blood (routine x 2) Call MD if unable to obtain prior to antibiotics being given     Status: None (Preliminary result)   Collection Time: 04/01/16  5:28 PM  Result Value Ref Range Status   Specimen Description BLOOD RIGHT ARM  Final   Special Requests BOTTLES DRAWN AEROBIC AND ANAEROBIC 10CC EACH  Final   Culture   Final    NO GROWTH 3 DAYS Performed at [facility]    Report Status PENDING  Incomplete  Culture, blood (routine x 2) Call MD if unable to obtain prior to antibiotics being given     Status: None (Preliminary result)   Collection Time: 04/01/16  5:28 PM  Result Value Ref Range Status   Specimen Description BLOOD LEFT THUMB  Final   Special Requests BOTTLES DRAWN AEROBIC AND ANAEROBIC 5CC EACH  Final   Culture   Final    NO GROWTH 3 DAYS Performed at [facility]  Report Status PENDING  Incomplete  Urine culture     Status: None   Collection Time: 04/02/16 12:36 AM  Result Value Ref Range Status   Specimen Description URINE, CLEAN CATCH  Final   Special Requests NONE  Final   Culture NO GROWTH Performed at [facility]   Final   Report Status 04/03/2016 FINAL  Final     Discharge Instructions:   Discharge Instructions    Diet - low sodium heart healthy    Complete by:  As directed   Discharge instructions    Complete by:  As directed   Stop smoking f/u PA and lateral chest radiographs recommended in 3-4 weeks following trial of antibiotic therapy   Increase activity slowly    Complete by:  As directed       Medication List    TAKE these medications   acidophilus Caps capsule Take 1 capsule by mouth daily.   albuterol (2.5 MG/3ML) 0.083% nebulizer solution Commonly known as:  PROVENTIL Take 3 mLs (2.5 mg total) by nebulization every 6 (six) hours as needed for wheezing or shortness  of breath.   albuterol 108 (90 Base) MCG/ACT inhaler Commonly known as:  PROVENTIL HFA;VENTOLIN HFA Inhale 2 puffs into the lungs every 6 (six) hours as needed for wheezing or shortness of breath.   ALPRAZolam 0.5 MG tablet Commonly known as:  XANAX Take 0.5 mg by mouth at bedtime as needed for sleep or anxiety.   aspirin-acetaminophen-caffeine 250-250-65 MG tablet Commonly known as:  EXCEDRIN MIGRAINE Take 2 tablets by mouth every 6 (six) hours as needed for headache.   BREO ELLIPTA 100-25 MCG/INH Aepb Generic drug:  fluticasone furoate-vilanterol Inhale 1 puff into the lungs at bedtime.   diazepam 2 MG tablet Commonly known as:  VALIUM Take 1 tablet (2 mg total) by mouth every 6 (six) hours as needed for anxiety.   diphenhydrAMINE 25 mg capsule Commonly known as:  BENADRYL Take 25 mg by mouth every 6 (six) hours as needed for itching. Reported on 01/19/2016   HYDROcodone-acetaminophen 5-325 MG tablet Commonly known as:  NORCO/VICODIN Take 1-2 tablets by mouth every 4 (four) hours as needed for moderate pain or severe pain.   hydrocortisone 1 % ointment Apply 1 application topically 2 (two) times daily.   ipratropium-albuterol 0.5-2.5 (3) MG/3ML Soln Commonly known as:  DUONEB Take 3 mLs by nebulization 3 (three) times daily.   levofloxacin 750 MG tablet Commonly known as:  LEVAQUIN Take 1 tablet (750 mg total) by mouth daily at 6 PM.   lidocaine-prilocaine cream Commonly known as:  EMLA Apply 1 application topically as needed (prior to accessing port).   mirtazapine 30 MG tablet Commonly known as:  REMERON Take 30 mg by mouth at bedtime as needed (for sleep).   nicotine 21 mg/24hr patch Commonly known as:  NICODERM CQ Place 1 patch (21 mg total) onto the skin daily.   predniSONE 10 MG tablet Commonly known as:  DELTASONE 40 mg PO x3 days, 30 mg PO x3 days, 20 mg PO x3 days, then 10 mg PO x3 days and stop What changed:  additional instructions    prochlorperazine 10 MG tablet Commonly known as:  COMPAZINE Take 1 tablet (10 mg total) by mouth every 6 (six) hours as needed for nausea or vomiting.   ranitidine 150 MG tablet Commonly known as:  ZANTAC Take 150 mg by mouth 2 (two) times daily.   rivaroxaban 20 MG Tabs tablet Commonly known as:  Alveda Reasons  Take 1 tablet (20 mg total) by mouth daily with supper.   traMADol 50 MG tablet Commonly known as:  ULTRAM Take 50 mg by mouth every 12 (twelve) hours as needed for moderate pain.         Time coordinating discharge: 35 min  Signed:  Jaquila Santelli U Glorianne Proctor   [facility] 04/05/2016, 8:37 AM

## 2016-04-05 NOTE — Progress Notes (Signed)
Patient c/o 8/10 back pain during discharge instructions. Discussed pain control, medication use, gave ordered PRN Norco. Noted next available dose on pt's discharge paperwork.

## 2016-04-05 NOTE — Progress Notes (Signed)
Pt ready for discharge. Discharge instructions reviewed and pt verbalizes understanding.

## 2016-04-05 NOTE — Care Management Note (Addendum)
Case Management Note  Patient Details  Name: Kasidee Voisin MRN: [id number] Date of Birth: Nov 16, 1950  Subjective/Objective:  COPD, lung cancer, PNA                  Action/Plan: Discharge Planning: AVS reviewed:  NCM spoke to pt at bedside. Pt states she does had Medicaid. She has to turn in medical bills to qualify again. Contacted [facility] and her medications will be $144.00 total. Provided pt with goodrx coupons for [employer] for Levaquin $2.80, and Prednisone $7.00. Contacted attending for printed Rx.    PCP- Micheline Chapman MD  Expected Discharge Date:  04/05/2016              Expected Discharge Plan:  Home/Self Care  In-House Referral:  NA  Discharge planning Services  CM Consult, Medication Assistance  Post Acute Care Choice:  NA Choice offered to:  NA  DME Arranged:  N/A DME Agency:  NA  HH Arranged:  NA HH Agency:  NA  Status of Service:  Completed, signed off  If discussed at [place] of Stay Meetings, dates discussed:    Additional Comments:  Erenest Rasher, RN 04/05/2016, 10:50 AM

## 2016-04-05 NOTE — Progress Notes (Addendum)
Patient walked in hallway. O2 saturation remained above 95% while walking. Pt walked almost entire length of hallway.

## 2016-04-06 LAB — CULTURE, BLOOD (ROUTINE X 2)
CULTURE: NO GROWTH
Culture: NO GROWTH

## 2016-04-07 ENCOUNTER — Telehealth: Payer: Self-pay

## 2016-04-07 NOTE — Telephone Encounter (Signed)
error 

## 2016-04-08 ENCOUNTER — Encounter: Payer: Self-pay | Admitting: Family Medicine

## 2016-04-08 ENCOUNTER — Ambulatory Visit (INDEPENDENT_AMBULATORY_CARE_PROVIDER_SITE_OTHER): Payer: Medicaid - Out of State | Admitting: Family Medicine

## 2016-04-08 VITALS — BP 137/70 | HR 100 | Temp 98.1°F | Resp 18 | Ht 65.0 in | Wt 196.0 lb

## 2016-04-08 DIAGNOSIS — J189 Pneumonia, unspecified organism: Secondary | ICD-10-CM

## 2016-04-08 MED ORDER — TRAMADOL HCL 50 MG PO TABS: 50.0000 mg | ORAL_TABLET | Freq: Two times a day (BID) | ORAL | 0 refills | Status: DC | PRN

## 2016-04-08 MED ORDER — DIAZEPAM 2 MG PO TABS: 2.0000 mg | ORAL_TABLET | Freq: Four times a day (QID) | ORAL | 0 refills | Status: DC | PRN

## 2016-04-08 NOTE — Patient Instructions (Signed)
Keep appointment with pulmonlogy and oncology as planned. Follow-up here at appointment already scheduled.

## 2016-04-08 NOTE — Progress Notes (Signed)
Heather Banks, is a 65 y.o. female  QQP:[id number]  [id number]:[id number]  DOB - January 28, 1951  CC:  Chief Complaint  Patient presents with  . Hospitalization Follow-up    pneumonia        HPI: Heather Banks is a 65 y.o. female here for follow-up hospitalization for pneumonia on 9/5. She has completed her antibiotic, is still on prednisone. She has a history of COPD and adenocarcinoma of the lungs. She has a pulmonology appt in one week and an oncology appt on Sept 26. Her complaint today is of mid back pain and anxiety. She ask for refills of Tramadol and Valium. She reports her cough continues but is better.  She has a routine follow-up visit here in November.  Allergies  Allergen Reactions  . Ativan [Lorazepam] Other (See Comments)    Reaction:  Makes pt angry   . Cinnamon Other (See Comments)    trigger migraine   Past Medical History:  Diagnosis Date  . Adenocarcinoma of left lung, stage 4 ([place]) 07/09/2014  . COPD (chronic obstructive pulmonary disease) ([facility])    Albuterol neb as needed;Pulmicort neb daily  . Encounter for antineoplastic chemotherapy 01/21/2015  . Full code status 05/01/2015  . GERD (gastroesophageal reflux disease)    takes Pantoprazole and Zantac daily  . History of bronchitis    >5 yrs ago  . History of migraine    last one 2 wks ago  . Hx of radiation therapy 10/29/2015 to 11/16/2015   The Left upper lobe was treated to 37.5 Gy in 15 fractions at 2.5 Gy per fraction  . Lung mass   . Panic attacks    but doesn't take any meds  . Pneumonia    hx of-last time about 4+yrs ago   Current Outpatient Prescriptions on File Prior to Visit  Medication Sig Dispense Refill  . acidophilus (RISAQUAD) CAPS capsule Take 1 capsule by mouth daily.    Marland Kitchen albuterol (PROVENTIL HFA;VENTOLIN HFA) 108 (90 Base) MCG/ACT inhaler Inhale 2 puffs into the lungs every 6 (six) hours as needed for wheezing or shortness of breath. 1 Inhaler 0  . albuterol (PROVENTIL) (2.5 MG/3ML) 0.083%  nebulizer solution Take 3 mLs (2.5 mg total) by nebulization every 6 (six) hours as needed for wheezing or shortness of breath. 75 mL 3  . aspirin-acetaminophen-caffeine (EXCEDRIN MIGRAINE) 825-053-97 MG per tablet Take 2 tablets by mouth every 6 (six) hours as needed for headache.    . diphenhydrAMINE (BENADRYL) 25 mg capsule Take 25 mg by mouth every 6 (six) hours as needed for itching. Reported on 01/19/2016    . fluticasone furoate-vilanterol (BREO ELLIPTA) 100-25 MCG/INH AEPB Inhale 1 puff into the lungs at bedtime.    Marland Kitchen HYDROcodone-acetaminophen (NORCO/VICODIN) 5-325 MG tablet Take 1-2 tablets by mouth every 4 (four) hours as needed for moderate pain or severe pain. 10 tablet 0  . ipratropium-albuterol (DUONEB) 0.5-2.5 (3) MG/3ML SOLN Take 3 mLs by nebulization 3 (three) times daily. 360 mL 1  . lidocaine-prilocaine (EMLA) cream Apply 1 application topically as needed (prior to accessing port).    . mirtazapine (REMERON) 30 MG tablet Take 30 mg by mouth at bedtime as needed (for sleep).    . predniSONE (DELTASONE) 10 MG tablet 40 mg PO x3 days, 30 mg PO x3 days, 20 mg PO x3 days, then 10 mg PO x3 days and stop 30 tablet 0  . ranitidine (ZANTAC) 150 MG tablet Take 150 mg by mouth 2 (two) times daily.     Marland Kitchen  rivaroxaban (XARELTO) 20 MG TABS tablet Take 1 tablet (20 mg total) by mouth daily with supper. 30 tablet 2  . ALPRAZolam (XANAX) 0.5 MG tablet Take 0.5 mg by mouth at bedtime as needed for sleep or anxiety.    . hydrocortisone 1 % ointment Apply 1 application topically 2 (two) times daily. (Patient not taking: Reported on 04/08/2016) 30 g 0  . levofloxacin (LEVAQUIN) 750 MG tablet Take 1 tablet (750 mg total) by mouth daily at 6 PM. (Patient not taking: Reported on 04/08/2016) 3 tablet 0  . nicotine (NICODERM CQ) 21 mg/24hr patch Place 1 patch (21 mg total) onto the skin daily. (Patient not taking: Reported on 04/01/2016) 28 patch 0  . prochlorperazine (COMPAZINE) 10 MG tablet Take 1 tablet (10 mg  total) by mouth every 6 (six) hours as needed for nausea or vomiting. (Patient not taking: Reported on 04/08/2016) 30 tablet 0   Current Facility-Administered Medications on File Prior to Visit  Medication Dose Route Frequency Provider Last Rate Last Dose  . heparin lock flush 100 unit/mL  500 Units Intracatheter Once PRN Curt Bears, MD      . sodium chloride 0.9 % injection 10 mL  10 mL Intracatheter PRN Curt Bears, MD   10 mL at 07/31/15 1635  . sodium chloride 0.9 % injection 10 mL  10 mL Intracatheter PRN Curt Bears, MD       Family History  Problem Relation Age of Onset  . COPD Father    Social History   Social History  . Marital status: Widowed    Spouse name: N/A  . Number of children: N/A  . Years of education: N/A   Occupational History  . Not on file.   Social History Main Topics  . Smoking status: Current Some Day Smoker    Packs/day: 0.25    Years: 44.00    Types: Cigarettes  . Smokeless tobacco: Never Used     Comment: 2 cigs a day  . Alcohol use No     Comment: no alcohol in 7 yrs   . Drug use: No  . Sexual activity: Not on file   Other Topics Concern  . Not on file   Social History Narrative  . No narrative on file    Review of Systems: Constitutional: Positive for fatigue Skin: Negative HENT: Negative  Eyes: Negative  Neck: Negative Respiratory: Positive for cough, shortness of breath and wheezing Cardiovascular: Negative Gastrointestinal: Positive for some heartburn Genitourinary: Negative  Musculoskeletal: Positive for mid back pain  Neurological: Positive for headaches Hematological: Negative  Psychiatric/Behavioral: Positive for anxiety   Objective:   Vitals:   04/08/16 1024  BP: 137/70  Pulse: 100  Resp: 18  Temp: 98.1 F (36.7 C)    Physical Exam: Constitutional: Patient appears well-developed and well-nourished. No obvious distress. HENT: Normocephalic, atraumatic, External right and left ear normal.  Oropharynx is clear and moist.  Eyes: Conjunctivae and EOM are normal. PERRLA, no scleral icterus. Neck: Normal ROM. Neck supple. No lymphadenopathy, No thyromegaly. CVS: RRR, S1/S2 +, no murmurs, no gallops, no rubs Pulmonary: Effort and breath sounds normal, no stridor, rhonchi, wheezes, rales.  Abdominal: Soft. Normoactive BS,, no distension, tenderness, rebound or guarding.  Musculoskeletal: Normal range of motion. No edema. There is tenderness over the midback Neuro: Alert.Normal muscle tone coordination. Non-focal Skin: Skin is warm and dry. No rash noted. Not diaphoretic. No erythema. No pallor. Psychiatric: Normal mood and affect. Behavior, judgment, thought content normal.  Lab Results  Component Value Date   WBC 9.9 04/03/2016   HGB 11.3 (L) 04/03/2016   HCT 34.9 (L) 04/03/2016   MCV 93.1 04/03/2016   PLT 286 04/03/2016   Lab Results  Component Value Date   CREATININE 0.59 04/03/2016   BUN 15 04/03/2016   NA 140 04/03/2016   K 3.9 04/03/2016   CL 102 04/03/2016   CO2 31 04/03/2016    No results found for: HGBA1C Lipid Panel     Component Value Date/Time   CHOL 261 (H) 02/26/2016 0917   TRIG 185 (H) 02/26/2016 0917   HDL 52 02/26/2016 0917   CHOLHDL 5.0 02/26/2016 0917   VLDL 37 (H) 02/26/2016 0917   LDLCALC 172 (H) 02/26/2016 0917       Assessment and plan:   There are no diagnoses linked to this encounter.  No Follow-up on file.  The patient was given clear instructions to go to ER or return to medical center if symptoms don't improve, worsen or new problems develop. The patient verbalized understanding.    Micheline Chapman FNP  04/08/2016, 12:01 PM

## 2016-04-10 ENCOUNTER — Encounter (HOSPITAL_COMMUNITY): Payer: Self-pay

## 2016-04-10 ENCOUNTER — Ambulatory Visit (HOSPITAL_COMMUNITY)
Admission: RE | Admit: 2016-04-10 | Discharge: 2016-04-10 | Disposition: A | Discharge status: Home / Self Care | Payer: Medicaid - Out of State | Source: Ambulatory Visit | Attending: Internal Medicine | Admitting: Internal Medicine

## 2016-04-10 ENCOUNTER — Other Ambulatory Visit (HOSPITAL_BASED_OUTPATIENT_CLINIC_OR_DEPARTMENT_OTHER): Payer: Self-pay

## 2016-04-10 DIAGNOSIS — S22049A Unspecified fracture of fourth thoracic vertebra, initial encounter for closed fracture: Secondary | ICD-10-CM | POA: Diagnosis not present

## 2016-04-10 DIAGNOSIS — R5382 Chronic fatigue, unspecified: Secondary | ICD-10-CM

## 2016-04-10 DIAGNOSIS — C3492 Malignant neoplasm of unspecified part of left bronchus or lung: Secondary | ICD-10-CM | POA: Insufficient documentation

## 2016-04-10 DIAGNOSIS — J449 Chronic obstructive pulmonary disease, unspecified: Secondary | ICD-10-CM

## 2016-04-10 DIAGNOSIS — F329 Major depressive disorder, single episode, unspecified: Secondary | ICD-10-CM

## 2016-04-10 DIAGNOSIS — S22039A Unspecified fracture of third thoracic vertebra, initial encounter for closed fracture: Secondary | ICD-10-CM | POA: Insufficient documentation

## 2016-04-10 DIAGNOSIS — Z789 Other specified health status: Secondary | ICD-10-CM

## 2016-04-10 DIAGNOSIS — L270 Generalized skin eruption due to drugs and medicaments taken internally: Secondary | ICD-10-CM

## 2016-04-10 DIAGNOSIS — R918 Other nonspecific abnormal finding of lung field: Secondary | ICD-10-CM | POA: Insufficient documentation

## 2016-04-10 DIAGNOSIS — F411 Generalized anxiety disorder: Secondary | ICD-10-CM

## 2016-04-10 DIAGNOSIS — C3490 Malignant neoplasm of unspecified part of unspecified bronchus or lung: Secondary | ICD-10-CM

## 2016-04-10 DIAGNOSIS — J441 Chronic obstructive pulmonary disease with (acute) exacerbation: Secondary | ICD-10-CM

## 2016-04-10 DIAGNOSIS — F32A Depression, unspecified: Secondary | ICD-10-CM

## 2016-04-10 DIAGNOSIS — Z5111 Encounter for antineoplastic chemotherapy: Secondary | ICD-10-CM

## 2016-04-10 DIAGNOSIS — F17200 Nicotine dependence, unspecified, uncomplicated: Secondary | ICD-10-CM

## 2016-04-10 DIAGNOSIS — Z5112 Encounter for antineoplastic immunotherapy: Secondary | ICD-10-CM

## 2016-04-10 DIAGNOSIS — R110 Nausea: Secondary | ICD-10-CM

## 2016-04-10 LAB — TSH: TSH: 0.724 m(IU)/L (ref 0.308–3.960)

## 2016-04-10 LAB — CBC WITH DIFFERENTIAL/PLATELET
BASO%: 0.1 % (ref 0.0–2.0)
Basophils Absolute: 0 10*3/uL (ref 0.0–0.1)
EOS ABS: 0.3 10*3/uL (ref 0.0–0.5)
EOS%: 2.5 % (ref 0.0–7.0)
HCT: 40.7 % (ref 34.8–46.6)
HGB: 13.5 g/dL (ref 11.6–15.9)
LYMPH%: 11.3 % — AB (ref 14.0–49.7)
MCH: 30.5 pg (ref 25.1–34.0)
MCHC: 33.2 g/dL (ref 31.5–36.0)
MCV: 91.9 fL (ref 79.5–101.0)
MONO#: 0.6 10*3/uL (ref 0.1–0.9)
MONO%: 5.9 % (ref 0.0–14.0)
NEUT%: 80.2 % — ABNORMAL HIGH (ref 38.4–76.8)
NEUTROS ABS: 8.5 10*3/uL — AB (ref 1.5–6.5)
Platelets: 273 10*3/uL (ref 145–400)
RBC: 4.43 10*6/uL (ref 3.70–5.45)
RDW: 16.6 % — ABNORMAL HIGH (ref 11.2–14.5)
WBC: 10.6 10*3/uL — AB (ref 3.9–10.3)
lymph#: 1.2 10*3/uL (ref 0.9–3.3)

## 2016-04-10 LAB — COMPREHENSIVE METABOLIC PANEL
ALT: 21 U/L (ref 0–55)
AST: 9 U/L (ref 5–34)
Albumin: 3.3 g/dL — ABNORMAL LOW (ref 3.5–5.0)
Alkaline Phosphatase: 87 U/L (ref 40–150)
Anion Gap: 12 mEq/L — ABNORMAL HIGH (ref 3–11)
BUN: 9 mg/dL (ref 7.0–26.0)
CHLORIDE: 105 meq/L (ref 98–109)
CO2: 22 meq/L (ref 22–29)
Calcium: 9 mg/dL (ref 8.4–10.4)
Creatinine: 0.7 mg/dL (ref 0.6–1.1)
GLUCOSE: 88 mg/dL (ref 70–140)
Potassium: 4.3 mEq/L (ref 3.5–5.1)
SODIUM: 140 meq/L (ref 136–145)
TOTAL PROTEIN: 6.6 g/dL (ref 6.4–8.3)

## 2016-04-10 MED ORDER — IOPAMIDOL (ISOVUE-300) INJECTION 61%
100.0000 mL | Freq: Once | INTRAVENOUS | Status: AC | PRN
  Administered 2016-04-10: 100 mL via INTRAVENOUS

## 2016-04-14 ENCOUNTER — Other Ambulatory Visit: Payer: Self-pay | Admitting: Pulmonary Disease

## 2016-04-14 ENCOUNTER — Encounter: Payer: Self-pay | Admitting: Pulmonary Disease

## 2016-04-14 ENCOUNTER — Ambulatory Visit (INDEPENDENT_AMBULATORY_CARE_PROVIDER_SITE_OTHER): Payer: Self-pay | Admitting: Pulmonary Disease

## 2016-04-14 VITALS — BP 110/78 | HR 88 | Ht 65.0 in | Wt 194.0 lb

## 2016-04-14 DIAGNOSIS — M8448XS Pathological fracture, other site, sequela: Secondary | ICD-10-CM

## 2016-04-14 MED ORDER — TRAMADOL HCL 50 MG PO TABS: 50.0000 mg | ORAL_TABLET | Freq: Two times a day (BID) | ORAL | 0 refills | Status: DC | PRN

## 2016-04-14 MED ORDER — FLUTICASONE FUROATE-VILANTEROL 100-25 MCG/INH IN AEPB
1.0000 | INHALATION_SPRAY | Freq: Every day | RESPIRATORY_TRACT | 0 refills | Status: DC
Start: 2016-04-14 — End: 2016-04-14

## 2016-04-14 MED ORDER — DIAZEPAM 2 MG PO TABS: 2.0000 mg | ORAL_TABLET | Freq: Four times a day (QID) | ORAL | 0 refills | Status: DC | PRN

## 2016-04-14 MED ORDER — UMECLIDINIUM-VILANTEROL 62.5-25 MCG/INH IN AEPB: 1.0000 | INHALATION_SPRAY | Freq: Every day | RESPIRATORY_TRACT | 0 refills | Status: DC

## 2016-04-14 NOTE — Assessment & Plan Note (Signed)
  Okay to stop Xarelto

## 2016-04-14 NOTE — Progress Notes (Signed)
Subjective:    Patient ID: Heather Banks, female    DOB: May 17, 1951, 65 y.o.   MRN: [id number]  HPI  65 yo smoker with COPD on 3l home O2 and adenocarcinoma lung, stage IV. She presented 06/2014 with  bilateral upper lobe lung mass w/ minmally enlarged hilar and mediastinal nodes, hypermetabolic on PET Underwent ENB guided biopsy ,  LUL bx was neg, RUL-2 targets showed adenoCA- felt to be synchronous tumors  Had disease progression on chemoRx x 6 cycles Completed  immunotherapy  Palliative Radiotherapy in LUL mass completed in 10/2015   04/14/2016  Chief Complaint  Patient presents with  . Acute Visit    ough, clear/ yellow mucus, chest tightness, sneezing, congestion.    She is accompanied today by her boyfriend from [place] whom she met online  Admitted 12/2015 for acute on chronic hypoxemic and hypercarbic respiratory failure secondary to severe acute COPD exacerbation,plus or minus healthcare associated pneumonia. CT chest showed a small PE in the left lateral basal segment of the pulmonary artery was started on Xarelto. .She did require initial BiPAP support. She was treated with antibiotics, steroids and nebulized bronchodilators. Venous Doppler was negative.   She has been taking Xarelto for 3 months and dyspnea is at baseline She continues to smoke 3-4 cigarettes per day  She complains of excruciating mid back pain radiating to both her sides Tramadol and Valium combination given to her by PCP provided some relief.    Remains on BREO .   We reviewed her imaging from 03/2016      Significant tests/ events  PFT 06/2014 FEV1 at 46%, ratio of 59. Diffusing capacity was decreased at 56% , FVC was 60%  CT chest 03/2016 ,  decreased in LUL mass. Adenopathy resolved. Stable right sided nodules.   Past Medical History:  Diagnosis Date  . Adenocarcinoma of left lung, stage 4 ([place]) 07/09/2014  . COPD (chronic obstructive pulmonary disease) ([facility])    Albuterol neb as  needed;Pulmicort neb daily  . Encounter for antineoplastic chemotherapy 01/21/2015  . Full code status 05/01/2015  . GERD (gastroesophageal reflux disease)    takes Pantoprazole and Zantac daily  . History of bronchitis    >5 yrs ago  . History of migraine    last one 2 wks ago  . Hx of radiation therapy 10/29/2015 to 11/16/2015   The Left upper lobe was treated to 37.5 Gy in 15 fractions at 2.5 Gy per fraction  . Lung mass   . Panic attacks    but doesn't take any meds  . Pneumonia    hx of-last time about 4+yrs ago     Review of Systems neg for any significant sore throat, dysphagia, itching, sneezing, nasal congestion or excess/ purulent secretions, fever, chills, sweats, unintended wt loss, pleuritic or exertional cp, hempoptysis, orthopnea pnd or change in chronic leg swelling.  Also denies presyncope, palpitations, heartburn, abdominal pain, nausea, vomiting, diarrhea or change in bowel or urinary habits, dysuria,hematuria, rash, arthralgias, visual complaints, headache, numbness weakness or ataxia.     Objective:   Physical Exam  Gen. Pleasant, well-nourished, in no distress ENT - no lesions, no post nasal drip Neck: No JVD, no thyromegaly, no carotid bruits Lungs: no use of accessory muscles, no dullness to percussion, clear without rales or rhonchi  Cardiovascular: Rhythm regular, heart sounds  normal, no murmurs or gallops, no peripheral edema Musculoskeletal: No deformities, no cyanosis or clubbing , Reproducible tenderness mid thoracic spine  Assessment & Plan:

## 2016-04-14 NOTE — Patient Instructions (Signed)
Referral to interventional radiology to see if they can put cement in your fractured vertebra  Refill on Valium and tramadol x 4 weeks  Trial of ANORO instead of BREO - call for prescription of this works  Musician to stop Xarelto

## 2016-04-14 NOTE — Assessment & Plan Note (Signed)
Referral to interventional radiology to see if they can put cement in your fractured vertebra  Refill on Valium and tramadol x 4 weeks Till she can see her PCP

## 2016-04-14 NOTE — Assessment & Plan Note (Signed)
Trial of ANORO instead of BREO - call for prescription of this works

## 2016-04-16 ENCOUNTER — Other Ambulatory Visit: Payer: Self-pay | Admitting: Pulmonary Disease

## 2016-04-16 ENCOUNTER — Other Ambulatory Visit: Payer: Self-pay

## 2016-04-17 ENCOUNTER — Ambulatory Visit: Payer: Self-pay | Admitting: Internal Medicine

## 2016-04-21 ENCOUNTER — Ambulatory Visit (HOSPITAL_BASED_OUTPATIENT_CLINIC_OR_DEPARTMENT_OTHER): Payer: Self-pay | Admitting: Internal Medicine

## 2016-04-21 ENCOUNTER — Telehealth: Payer: Self-pay | Admitting: Internal Medicine

## 2016-04-21 ENCOUNTER — Encounter: Payer: Self-pay | Admitting: Medical Oncology

## 2016-04-21 ENCOUNTER — Encounter: Payer: Self-pay | Admitting: Internal Medicine

## 2016-04-21 VITALS — BP 122/76 | HR 93 | Temp 98.5°F | Resp 17 | Ht 65.0 in | Wt 193.3 lb

## 2016-04-21 DIAGNOSIS — F418 Other specified anxiety disorders: Secondary | ICD-10-CM

## 2016-04-21 DIAGNOSIS — C3412 Malignant neoplasm of upper lobe, left bronchus or lung: Secondary | ICD-10-CM

## 2016-04-21 DIAGNOSIS — K219 Gastro-esophageal reflux disease without esophagitis: Secondary | ICD-10-CM

## 2016-04-21 DIAGNOSIS — F17210 Nicotine dependence, cigarettes, uncomplicated: Secondary | ICD-10-CM

## 2016-04-21 DIAGNOSIS — C3492 Malignant neoplasm of unspecified part of left bronchus or lung: Secondary | ICD-10-CM

## 2016-04-21 DIAGNOSIS — S22000A Wedge compression fracture of unspecified thoracic vertebra, initial encounter for closed fracture: Secondary | ICD-10-CM

## 2016-04-21 DIAGNOSIS — L271 Localized skin eruption due to drugs and medicaments taken internally: Secondary | ICD-10-CM

## 2016-04-21 HISTORY — DX: Wedge compression fracture of unspecified thoracic vertebra, initial encounter for closed fracture: S22.000A

## 2016-04-21 NOTE — Telephone Encounter (Signed)
09/19 and 09/26 Appointments scheduled per 09/25 LOS. Patient given AVS and schedule of future appointments.

## 2016-04-21 NOTE — Progress Notes (Signed)
[place] [phone]   [phone]  OFFICE PROGRESS NOTE  Heather Seller, NP [address] [place] [address]  DIAGNOSIS: Stage IV (T2a, N0, M1a) non-small cell lung cancer, adenocarcinoma presented with bilateral pulmonary lesions. This could be also consider as synchronous primary tumors.  PRIOR THERAPY:  1) Systemic chemotherapy with carboplatin for AUC of 5 and Alimta 500 MG/M2 every 3 weeks. Status post 6 cycles. 2) Treatment according to the BMS checkmate 370 clinical trial. Randomized to group A arm B1 treatment with pemetrexed (Alimta) 500 mg/m given every 3 weeks. Status post 6 cycles discontinued secondary to disease progression. 3) Tecentriq 1200 mg IV every 3 weeks status post 6 cycles discontinued secondary to significant skin rash. 4) palliative radiotherapy to the left upper lobe lung mass under the care of Dr. Lisbeth Renshaw completed on 11/16/2015.  CURRENT THERAPY: Observation.  INTERVAL HISTORY: Heather Banks 65 y.o. female returns to the clinic today for follow-up visit. The patient is feeling fine today with no specific complaints persistent severe back pain secondary to compression fractures of T3 and T4. She was referred by Dr. Elsworth Soho to orthopedic surgery but they refused to see her because they would not be able to help her. She is currently on tramadol for pain management. She denied having any significant chest pain, shortness of breath, or hemoptysis. She has no nausea or vomiting. She denied having any significant weight loss or night sweats. The patient had repeat CT scan of the chest, abdomen and pelvis performed recently and she is here for evaluation and discussion of her scan results.   MEDICAL HISTORY: Past Medical History:  Diagnosis Date  . Adenocarcinoma of left lung, stage 4 ([place]) 07/09/2014  . COPD (chronic obstructive pulmonary disease) ([facility])    Albuterol neb as needed;Pulmicort neb daily  . Encounter for  antineoplastic chemotherapy 01/21/2015  . Full code status 05/01/2015  . GERD (gastroesophageal reflux disease)    takes Pantoprazole and Zantac daily  . History of bronchitis    >5 yrs ago  . History of migraine    last one 2 wks ago  . Hx of radiation therapy 10/29/2015 to 11/16/2015   The Left upper lobe was treated to 37.5 Gy in 15 fractions at 2.5 Gy per fraction  . Lung mass   . Panic attacks    but doesn't take any meds  . Pneumonia    hx of-last time about 4+yrs ago    ALLERGIES:  is allergic to ativan [lorazepam] and cinnamon.  MEDICATIONS:  Current Outpatient Prescriptions  Medication Sig Dispense Refill  . acidophilus (RISAQUAD) CAPS capsule Take 1 capsule by mouth daily.    Marland Kitchen albuterol (PROVENTIL HFA;VENTOLIN HFA) 108 (90 Base) MCG/ACT inhaler Inhale 2 puffs into the lungs every 6 (six) hours as needed for wheezing or shortness of breath. 1 Inhaler 0  . albuterol (PROVENTIL) (2.5 MG/3ML) 0.083% nebulizer solution Take 3 mLs (2.5 mg total) by nebulization every 6 (six) hours as needed for wheezing or shortness of breath. 75 mL 3  . ALPRAZolam (XANAX) 0.5 MG tablet Take 0.5 mg by mouth at bedtime as needed for sleep or anxiety.    Marland Kitchen aspirin-acetaminophen-caffeine (EXCEDRIN MIGRAINE) 250-250-65 MG per tablet Take 2 tablets by mouth every 6 (six) hours as needed for headache.    . diazepam (VALIUM) 2 MG tablet Take 1 tablet (2 mg total) by mouth every 6 (six) hours as needed for anxiety. 30 tablet 0  . diphenhydrAMINE (  BENADRYL) 25 mg capsule Take 25 mg by mouth every 6 (six) hours as needed for itching. Reported on 01/19/2016    . HYDROcodone-acetaminophen (NORCO/VICODIN) 5-325 MG tablet Take 1-2 tablets by mouth every 4 (four) hours as needed for moderate pain or severe pain. 10 tablet 0  . hydrocortisone 1 % ointment Apply 1 application topically 2 (two) times daily. 30 g 0  . ipratropium-albuterol (DUONEB) 0.5-2.5 (3) MG/3ML SOLN Take 3 mLs by nebulization 3 (three) times daily.  360 mL 1  . lidocaine-prilocaine (EMLA) cream Apply 1 application topically as needed (prior to accessing port).    . mirtazapine (REMERON) 30 MG tablet Take 30 mg by mouth at bedtime as needed (for sleep).    . predniSONE (DELTASONE) 10 MG tablet 40 mg PO x3 days, 30 mg PO x3 days, 20 mg PO x3 days, then 10 mg PO x3 days and stop 30 tablet 0  . prochlorperazine (COMPAZINE) 10 MG tablet Take 1 tablet (10 mg total) by mouth every 6 (six) hours as needed for nausea or vomiting. 30 tablet 0  . ranitidine (ZANTAC) 150 MG tablet Take 150 mg by mouth 2 (two) times daily.     . traMADol (ULTRAM) 50 MG tablet Take 1 tablet (50 mg total) by mouth every 12 (twelve) hours as needed for moderate pain. 30 tablet 0  . umeclidinium-vilanterol (ANORO ELLIPTA) 62.5-25 MCG/INH AEPB Inhale 1 puff into the lungs daily. 2 each 0   No current facility-administered medications for this visit.    Facility-Administered Medications Ordered in Other Visits  Medication Dose Route Frequency Provider Last Rate Last Dose  . heparin lock flush 100 unit/mL  500 Units Intracatheter Once PRN Curt Bears, MD      . sodium chloride 0.9 % injection 10 mL  10 mL Intracatheter PRN Curt Bears, MD   10 mL at 07/31/15 1635  . sodium chloride 0.9 % injection 10 mL  10 mL Intracatheter PRN Curt Bears, MD        SURGICAL HISTORY:  Past Surgical History:  Procedure Laterality Date  . ABDOMINAL HYSTERECTOMY    . APPENDECTOMY    . OOPHORECTOMY    . PORTACATH PLACEMENT  jan. 2016  . TONSILLECTOMY    . VIDEO BRONCHOSCOPY WITH ENDOBRONCHIAL NAVIGATION N/A 08/14/2014   Procedure: VIDEO BRONCHOSCOPY WITH ENDOBRONCHIAL NAVIGATION;  Surgeon: Rigoberto Noel, MD;  Location: [facility];  Service: Thoracic;  Laterality: N/A;    REVIEW OF SYSTEMS:  Constitutional: positive for fatigue Eyes: negative Ears, nose, mouth, throat, and face: negative Respiratory: positive for cough Cardiovascular: negative Gastrointestinal:  negative Genitourinary:negative Integument/breast: negative Hematologic/lymphatic: negative Musculoskeletal:positive for back pain and bone pain Neurological: negative Behavioral/Psych: negative Endocrine: negative Allergic/Immunologic: negative   PHYSICAL EXAMINATION: General appearance: alert, cooperative and no distress Head: Normocephalic, without obvious abnormality, atraumatic Neck: no adenopathy, no JVD, supple, symmetrical, trachea midline and thyroid not enlarged, symmetric, no tenderness/mass/nodules Lymph nodes: Cervical, supraclavicular, and axillary nodes normal. Resp: clear to auscultation bilaterally Back: symmetric, no curvature. ROM normal. No CVA tenderness. Cardio: regular rate and rhythm, S1, S2 normal, no murmur, click, rub or gallop GI: soft, non-tender; bowel sounds normal; no masses,  no organomegaly Extremities: extremities normal, atraumatic, no cyanosis or edema Neurologic: Alert and oriented X 3, normal strength and tone. Normal symmetric reflexes. Normal coordination and gait   ECOG PERFORMANCE STATUS: 1 - Symptomatic but completely ambulatory  Blood pressure 122/76, pulse 93, temperature 98.5 F (36.9 C), temperature source Oral, resp. rate 17, height 5'  5" (1.651 m), weight 193 lb 4.8 oz (87.7 kg), SpO2 99 %.  LABORATORY DATA: Lab Results  Component Value Date   WBC 10.6 (H) 04/10/2016   HGB 13.5 04/10/2016   HCT 40.7 04/10/2016   MCV 91.9 04/10/2016   PLT 273 04/10/2016      Chemistry      Component Value Date/Time   NA 140 04/10/2016 0948   K 4.3 04/10/2016 0948   CL 102 04/03/2016 0440   CO2 22 04/10/2016 0948   BUN 9.0 04/10/2016 0948   CREATININE 0.7 04/10/2016 0948      Component Value Date/Time   CALCIUM 9.0 04/10/2016 0948   ALKPHOS 87 04/10/2016 0948   AST 9 04/10/2016 0948   ALT 21 04/10/2016 0948   BILITOT <0.30 04/10/2016 0948       RADIOGRAPHIC STUDIES: Ct Chest W Contrast  Result Date: 04/10/2016 CLINICAL DATA:   Stage IV left lung cancer, diagnosed January 2016, chemotherapy/ immunotherapy section XRT complete. Recent pneumonia, weakness, cough, upper back pain. EXAM: CT CHEST, ABDOMEN, AND PELVIS WITH CONTRAST TECHNIQUE: Multidetector CT imaging of the chest, abdomen and pelvis was performed following the standard protocol during bolus administration of intravenous contrast. CONTRAST:  147m ISOVUE-300 IOPAMIDOL (ISOVUE-300) INJECTION 61% COMPARISON:  01/01/2016 FINDINGS: CT CHEST FINDINGS Cardiovascular: Heart is normal in size.  No pericardial effusion. Three vessel coronary atherosclerosis. Atherosclerotic calcifications of the aortic arch. Mediastinum/Nodes: No suspicious mediastinal, hilar, or axillary lymphadenopathy. Visualized thyroid is unremarkable. Lungs/Pleura: 2.8 x 2.7 x 3.2 cm left upper lobe mass, previously 3.0 x 3.3 x 4.3 cm, decreased. Right lung nodules are grossly unchanged, including: --1.9 cm spiculated right upper lobe nodule (series 6/ image 22) --Adjacent 1.9 cm spiculated nodule (series 6/ image 25) --2.9 x 1.9 cm mixed cystic/ solid nodule anteriorly in the right upper lobe (series 6/ image 35) No focal consolidation. No pleural effusion or pneumothorax. Musculoskeletal: Degenerative changes of the thoracic spine. Mild superior endplate compression fracture deformities at T3 and T4, with associated mild sclerosis at T3, pathologic fracture not excluded. CT ABDOMEN PELVIS FINDINGS Hepatobiliary: Liver is within normal limits. Gallbladder is unremarkable. No intrahepatic or extrahepatic ductal dilatation. Pancreas: Within normal limits. Spleen: Within normal limits. Adrenals/Urinary Tract: Adrenal glands are within normal limits. Multiple right renal cysts, measuring up to 2.7 cm in the right lower kidney (series 2/image 58). Left kidney is within normal limits. Bilateral renal vascular calcifications. No hydronephrosis. Bladder is within normal limits. Stomach/Bowel: Stomach is within normal  limits. No evidence of bowel obstruction. Appendix is not discretely visualized. Vascular/Lymphatic: No evidence of abdominal aortic aneurysm. Atherosclerotic calcifications of the abdominal aorta and branch vessels. No suspicious abdominopelvic lymphadenopathy. Reproductive: Status post hysterectomy. No adnexal masses. Other: No abdominopelvic ascites. Musculoskeletal: Visualized osseous structures are within normal limits. IMPRESSION: 3.2 cm left upper lobe mass, corresponding to known primary bronchogenic neoplasm, decreased. Stable right lung nodules, as above. Mild superior endplate compression fracture deformities at T3 and T4, as above. Pathologic fracture at T3 is not excluded. No evidence of metastatic disease in the abdomen/ pelvis. Additional ancillary findings as above. Electronically Signed   By: SJulian HyM.D.   On: 04/10/2016 15:46   Ct Abdomen Pelvis W Contrast  Result Date: 04/10/2016 CLINICAL DATA:  Stage IV left lung cancer, diagnosed January 2016, chemotherapy/ immunotherapy section XRT complete. Recent pneumonia, weakness, cough, upper back pain. EXAM: CT CHEST, ABDOMEN, AND PELVIS WITH CONTRAST TECHNIQUE: Multidetector CT imaging of the chest, abdomen and pelvis was performed following the  standard protocol during bolus administration of intravenous contrast. CONTRAST:  152m ISOVUE-300 IOPAMIDOL (ISOVUE-300) INJECTION 61% COMPARISON:  01/01/2016 FINDINGS: CT CHEST FINDINGS Cardiovascular: Heart is normal in size.  No pericardial effusion. Three vessel coronary atherosclerosis. Atherosclerotic calcifications of the aortic arch. Mediastinum/Nodes: No suspicious mediastinal, hilar, or axillary lymphadenopathy. Visualized thyroid is unremarkable. Lungs/Pleura: 2.8 x 2.7 x 3.2 cm left upper lobe mass, previously 3.0 x 3.3 x 4.3 cm, decreased. Right lung nodules are grossly unchanged, including: --1.9 cm spiculated right upper lobe nodule (series 6/ image 22) --Adjacent 1.9 cm spiculated  nodule (series 6/ image 25) --2.9 x 1.9 cm mixed cystic/ solid nodule anteriorly in the right upper lobe (series 6/ image 35) No focal consolidation. No pleural effusion or pneumothorax. Musculoskeletal: Degenerative changes of the thoracic spine. Mild superior endplate compression fracture deformities at T3 and T4, with associated mild sclerosis at T3, pathologic fracture not excluded. CT ABDOMEN PELVIS FINDINGS Hepatobiliary: Liver is within normal limits. Gallbladder is unremarkable. No intrahepatic or extrahepatic ductal dilatation. Pancreas: Within normal limits. Spleen: Within normal limits. Adrenals/Urinary Tract: Adrenal glands are within normal limits. Multiple right renal cysts, measuring up to 2.7 cm in the right lower kidney (series 2/image 58). Left kidney is within normal limits. Bilateral renal vascular calcifications. No hydronephrosis. Bladder is within normal limits. Stomach/Bowel: Stomach is within normal limits. No evidence of bowel obstruction. Appendix is not discretely visualized. Vascular/Lymphatic: No evidence of abdominal aortic aneurysm. Atherosclerotic calcifications of the abdominal aorta and branch vessels. No suspicious abdominopelvic lymphadenopathy. Reproductive: Status post hysterectomy. No adnexal masses. Other: No abdominopelvic ascites. Musculoskeletal: Visualized osseous structures are within normal limits. IMPRESSION: 3.2 cm left upper lobe mass, corresponding to known primary bronchogenic neoplasm, decreased. Stable right lung nodules, as above. Mild superior endplate compression fracture deformities at T3 and T4, as above. Pathologic fracture at T3 is not excluded. No evidence of metastatic disease in the abdomen/ pelvis. Additional ancillary findings as above. Electronically Signed   By: SJulian HyM.D.   On: 04/10/2016 15:46   Dg Chest Portable 1 View  Result Date: 04/01/2016 CLINICAL DATA:  Wheezing and tachycardia EXAM: PORTABLE CHEST 1 VIEW COMPARISON:  February 27, 2016 FINDINGS: There is patchy interstitial prominence and airspace consolidation in the right base. There is also subtle opacity in the left upper lobe, new from prior study. The lungs elsewhere are clear. The heart size and pulmonary vascularity are normal. No adenopathy. Port-A-Cath tip is at the cavoatrial junction. No pneumothorax. No bone lesions. IMPRESSION: Patchy infiltrate right base consistent with pneumonia. Questions second focus of early pneumonia left upper lobe. Lungs elsewhere clear. Stable cardiac silhouette. No pneumothorax. Followup PA and lateral chest radiographs recommended in 3-4 weeks following trial of antibiotic therapy to ensure resolution and exclude underlying malignancy. Electronically Signed   By: WLowella GripIII M.D.   On: 04/01/2016 14:04    ASSESSMENT AND PLAN: This is a very pleasant 65years old white female with stage IV non-small cell lung cancer currently undergoing systemic chemotherapy with carboplatin and Alimta status post 6 cycles.  She she completed a course of maintenance chemotherapy with single agent Alimta status post 6 cycles and tolerating her treatment fairly well but this was discontinued secondary to disease progression.  The patient is currently undergoing treatment with immunotherapy with Tecentriq (Huey Bienenstock status post 6 cycles. She is tolerating her treatment fairly well except for grade 2 macular skin rash on the upper and lower extremities. She also underwent palliative radiotherapy to the left  upper lobe lung mass. The recent CT scan of the chest, abdomen and pelvis showed no evidence for disease progression but she continues to have the compression fractures of T3-T4. She is complaining of a lot of pain in this area. I recommended for her to continue on observation with repeat CT scan of the chest, abdomen and pelvis in 3 months. For smoke cessation, I strongly encouraged the patient to quit smoking. For depression, she will  continue on Remeron 30 mg by mouth daily at bedtime. For anxiety, she will continue on Xanax. For the acid reflux, the patient will continue Prilosec 20 mg by mouth twice a day. For the compression fractures, I will refer the patient to Dr. Estanislado Pandy, interventional radiology for evaluation and consideration of vertebroplasty if possible. CODE STATUS: FULL CODE STATUS but no prolonged support if she is in a vegetative state. She was advised to call immediately if she has any concerning symptoms in the interval. The patient voices understanding of current disease status and treatment options and is in agreement with the current care plan.  All questions were answered. The patient knows to call the clinic with any problems, questions or concerns. We can certainly see the patient much sooner if necessary.  Disclaimer: This note was dictated with voice recognition software. Similar sounding words can inadvertently be transcribed and may not be corrected upon review.

## 2016-04-21 NOTE — Progress Notes (Signed)
BMS-370: Group A, Arm B1 (9 month follow-up) I met with patient and patient's fiancee today in exam room for patient's follow up appointment with Dr. Julien Nordmann and review of recent scans. Patient completed study required PRO's. Patient reports significant back pain that she states is related to the compression fractures of T3-T4, Dr. Julien Nordmann referred patient to IR for evaluation. Per Dr. Julien Nordmann, CT scan of chest, abdomen and pelvis shows "no evidence for disease progression." Patient positive with cough and fatigue. Per Dr. Julien Nordmann, he would like patient to return in 3 months for follow-up.   Patient was informed of new study consent, Version 3.1, dated 03/03/16, and that the only changes reflected in this consent is the [facility]'s address. This change was reviewed with patient and patient expressed understanding. After all patient's questions answered to her satisfaction, patient proceeded to initial and date each page and sign where indicated. Patient was provided with a copy of the signed consent for her records and thanked for her time and continued support of study. Patient encouraged to contact Dr. Julien Nordmann or myself with any questions or concerns she may have.  Adele Dan, RN, BSN Clinical Research 04/21/2016 12:27 PM

## 2016-04-21 NOTE — Telephone Encounter (Signed)
12/19 and 12/26 Appointments scheduled per 09/25 LOS. Patient was given AVS and schedule for future appointments.

## 2016-04-23 NOTE — Telephone Encounter (Signed)
Left message for [name] at [facility] IR [phone]) re referral to IR/Dr. Patrecia Pour.   Patient was given appointments for 12/19 and 12/26 prior to leaving 9/25. Central radiology will call re scan.

## 2016-04-28 ENCOUNTER — Telehealth: Payer: Self-pay | Admitting: Adult Health

## 2016-04-28 NOTE — Telephone Encounter (Signed)
Okay to get Prevnar if she has never gotten this

## 2016-04-28 NOTE — Telephone Encounter (Signed)
Spoke with the pt  She is asking whether or not to get a pneumovax and flu vaccine  I advised that she definitely needs to get a flu shot, but will have to ask about pneumovax  She had her last pneumovax in 2012  Please advise thanks!

## 2016-04-28 NOTE — Telephone Encounter (Signed)
Spoke with pt and advised her that RA has given ok for her to get Prevnar. Pt voiced understanding. Nothing further needed.

## 2016-04-30 ENCOUNTER — Other Ambulatory Visit (HOSPITAL_COMMUNITY): Payer: Self-pay | Admitting: Interventional Radiology

## 2016-04-30 DIAGNOSIS — C3490 Malignant neoplasm of unspecified part of unspecified bronchus or lung: Secondary | ICD-10-CM

## 2016-04-30 DIAGNOSIS — M8458XA Pathological fracture in neoplastic disease, other specified site, initial encounter for fracture: Secondary | ICD-10-CM

## 2016-04-30 DIAGNOSIS — C7951 Secondary malignant neoplasm of bone: Principal | ICD-10-CM

## 2016-05-01 ENCOUNTER — Ambulatory Visit: Payer: Medicaid - Out of State | Admitting: Family Medicine

## 2016-05-02 ENCOUNTER — Other Ambulatory Visit (HOSPITAL_COMMUNITY): Payer: Self-pay | Admitting: Interventional Radiology

## 2016-05-02 ENCOUNTER — Ambulatory Visit (HOSPITAL_COMMUNITY)
Admission: RE | Admit: 2016-05-02 | Discharge: 2016-05-02 | Disposition: A | Discharge status: Home / Self Care | Payer: Self-pay | Source: Ambulatory Visit | Attending: Interventional Radiology | Admitting: Interventional Radiology

## 2016-05-02 DIAGNOSIS — C3490 Malignant neoplasm of unspecified part of unspecified bronchus or lung: Secondary | ICD-10-CM

## 2016-05-02 DIAGNOSIS — C7951 Secondary malignant neoplasm of bone: Principal | ICD-10-CM

## 2016-05-02 DIAGNOSIS — Z8781 Personal history of (healed) traumatic fracture: Secondary | ICD-10-CM

## 2016-05-02 DIAGNOSIS — M8458XA Pathological fracture in neoplastic disease, other specified site, initial encounter for fracture: Secondary | ICD-10-CM

## 2016-05-02 DIAGNOSIS — M546 Pain in thoracic spine: Secondary | ICD-10-CM

## 2016-05-02 HISTORY — PX: IR GENERIC HISTORICAL: IMG1180011

## 2016-05-06 ENCOUNTER — Other Ambulatory Visit: Payer: Self-pay | Admitting: Radiology

## 2016-05-07 ENCOUNTER — Other Ambulatory Visit: Payer: Self-pay | Admitting: Physician Assistant

## 2016-05-07 ENCOUNTER — Other Ambulatory Visit: Payer: Self-pay | Admitting: Radiology

## 2016-05-08 ENCOUNTER — Other Ambulatory Visit: Payer: Self-pay | Admitting: Hematology

## 2016-05-08 ENCOUNTER — Ambulatory Visit (HOSPITAL_COMMUNITY)
Admission: RE | Admit: 2016-05-08 | Discharge: 2016-05-08 | Disposition: A | Discharge status: Home / Self Care | Payer: Medicaid - Out of State | Source: Ambulatory Visit | Attending: Interventional Radiology | Admitting: Interventional Radiology

## 2016-05-08 ENCOUNTER — Other Ambulatory Visit (HOSPITAL_COMMUNITY): Payer: Self-pay | Admitting: Interventional Radiology

## 2016-05-08 ENCOUNTER — Other Ambulatory Visit (HOSPITAL_COMMUNITY)
Admission: RE | Admit: 2016-05-08 | Discharge: 2016-05-08 | Disposition: A | Discharge status: Home / Self Care | Payer: Self-pay | Source: Ambulatory Visit | Attending: Hematology | Admitting: Hematology

## 2016-05-08 ENCOUNTER — Encounter: Payer: Self-pay | Admitting: *Deleted

## 2016-05-08 ENCOUNTER — Encounter (HOSPITAL_COMMUNITY): Payer: Self-pay | Admitting: Interventional Radiology

## 2016-05-08 DIAGNOSIS — Z923 Personal history of irradiation: Secondary | ICD-10-CM | POA: Diagnosis not present

## 2016-05-08 DIAGNOSIS — Z85118 Personal history of other malignant neoplasm of bronchus and lung: Secondary | ICD-10-CM | POA: Diagnosis not present

## 2016-05-08 DIAGNOSIS — K219 Gastro-esophageal reflux disease without esophagitis: Secondary | ICD-10-CM | POA: Diagnosis not present

## 2016-05-08 DIAGNOSIS — J449 Chronic obstructive pulmonary disease, unspecified: Secondary | ICD-10-CM | POA: Insufficient documentation

## 2016-05-08 DIAGNOSIS — C3490 Malignant neoplasm of unspecified part of unspecified bronchus or lung: Secondary | ICD-10-CM

## 2016-05-08 DIAGNOSIS — Z8781 Personal history of (healed) traumatic fracture: Secondary | ICD-10-CM

## 2016-05-08 DIAGNOSIS — M546 Pain in thoracic spine: Secondary | ICD-10-CM

## 2016-05-08 DIAGNOSIS — M4854XA Collapsed vertebra, not elsewhere classified, thoracic region, initial encounter for fracture: Secondary | ICD-10-CM | POA: Insufficient documentation

## 2016-05-08 DIAGNOSIS — C7951 Secondary malignant neoplasm of bone: Secondary | ICD-10-CM

## 2016-05-08 DIAGNOSIS — Z7982 Long term (current) use of aspirin: Secondary | ICD-10-CM | POA: Diagnosis not present

## 2016-05-08 DIAGNOSIS — Z9221 Personal history of antineoplastic chemotherapy: Secondary | ICD-10-CM | POA: Diagnosis not present

## 2016-05-08 DIAGNOSIS — C3492 Malignant neoplasm of unspecified part of left bronchus or lung: Secondary | ICD-10-CM

## 2016-05-08 HISTORY — PX: IR GENERIC HISTORICAL: IMG1180011

## 2016-05-08 LAB — BASIC METABOLIC PANEL
ANION GAP: 8 (ref 5–15)
BUN: 7 mg/dL (ref 6–20)
CALCIUM: 8.7 mg/dL — AB (ref 8.9–10.3)
CHLORIDE: 108 mmol/L (ref 101–111)
CO2: 24 mmol/L (ref 22–32)
Creatinine, Ser: 0.55 mg/dL (ref 0.44–1.00)
GFR calc non Af Amer: >60 mL/min (ref >60)
Glucose, Bld: 91 mg/dL (ref 65–99)
Potassium: 3.5 mmol/L (ref 3.5–5.1)
Sodium: 140 mmol/L (ref 135–145)

## 2016-05-08 LAB — CBC WITH DIFFERENTIAL/PLATELET
BASOS PCT: 1 %
Basophils Absolute: 0.1 10*3/uL (ref 0.0–0.1)
Eosinophils Absolute: 0.4 10*3/uL (ref 0.0–0.7)
Eosinophils Relative: 7 %
HEMATOCRIT: 40.2 % (ref 36.0–46.0)
HEMOGLOBIN: 12.7 g/dL (ref 12.0–15.0)
LYMPHS ABS: 1.1 10*3/uL (ref 0.7–4.0)
Lymphocytes Relative: 21 %
MCH: 30 pg (ref 26.0–34.0)
MCHC: 31.6 g/dL (ref 30.0–36.0)
MCV: 95 fL (ref 78.0–100.0)
MONOS PCT: 11 %
Monocytes Absolute: 0.6 10*3/uL (ref 0.1–1.0)
NEUTROS ABS: 3.2 10*3/uL (ref 1.7–7.7)
NEUTROS PCT: 60 %
Platelets: 276 10*3/uL (ref 150–400)
RBC: 4.23 MIL/uL (ref 3.87–5.11)
RDW: 17.2 % — ABNORMAL HIGH (ref 11.5–15.5)
WBC: 5.4 10*3/uL (ref 4.0–10.5)

## 2016-05-08 LAB — PROTIME-INR
INR: 0.97
Prothrombin Time: 12.8 seconds (ref 11.4–15.2)

## 2016-05-08 MED ORDER — SODIUM CHLORIDE 0.9 % IV SOLN
INTRAVENOUS | Status: AC | PRN
  Administered 2016-05-08: 10 mL/h via INTRAVENOUS

## 2016-05-08 MED ORDER — HYDROMORPHONE HCL 1 MG/ML IJ SOLN
INTRAMUSCULAR | Status: AC | PRN
  Administered 2016-05-08: 1 mg via INTRAVENOUS

## 2016-05-08 MED ORDER — HEPARIN SOD (PORK) LOCK FLUSH 100 UNIT/ML IV SOLN
500.0000 [IU] | INTRAVENOUS | Status: AC | PRN
  Administered 2016-05-08: 500 [IU]

## 2016-05-08 MED ORDER — HYDROMORPHONE HCL 1 MG/ML IJ SOLN
INTRAMUSCULAR | Status: AC
  Filled 2016-05-08: qty 1

## 2016-05-08 MED ORDER — TOBRAMYCIN SULFATE 1.2 G IJ SOLR
INTRAMUSCULAR | Status: AC | PRN
  Administered 2016-05-08: 1.2 g via TOPICAL

## 2016-05-08 MED ORDER — CEFAZOLIN SODIUM-DEXTROSE 2-4 GM/100ML-% IV SOLN
INTRAVENOUS | Status: AC
  Filled 2016-05-08: qty 100

## 2016-05-08 MED ORDER — HYDRALAZINE HCL 20 MG/ML IJ SOLN
INTRAMUSCULAR | Status: AC | PRN
  Administered 2016-05-08: 5 mg via INTRAVENOUS

## 2016-05-08 MED ORDER — FENTANYL CITRATE (PF) 100 MCG/2ML IJ SOLN
INTRAMUSCULAR | Status: AC | PRN
  Administered 2016-05-08: 25 ug via INTRAVENOUS
  Administered 2016-05-08: 50 ug via INTRAVENOUS

## 2016-05-08 MED ORDER — IOPAMIDOL (ISOVUE-300) INJECTION 61%
INTRAVENOUS | Status: AC | PRN
  Administered 2016-05-08: 1 mL

## 2016-05-08 MED ORDER — SODIUM CHLORIDE 0.9 % IV SOLN: INTRAVENOUS | Status: AC

## 2016-05-08 MED ORDER — HYDRALAZINE HCL 20 MG/ML IJ SOLN
INTRAMUSCULAR | Status: AC
  Filled 2016-05-08: qty 1

## 2016-05-08 MED ORDER — MIDAZOLAM HCL 2 MG/2ML IJ SOLN
INTRAMUSCULAR | Status: AC
  Filled 2016-05-08: qty 2

## 2016-05-08 MED ORDER — CEFAZOLIN SODIUM-DEXTROSE 2-4 GM/100ML-% IV SOLN
2.0000 g | INTRAVENOUS | Status: AC
  Administered 2016-05-08: 2 g via INTRAVENOUS

## 2016-05-08 MED ORDER — BUPIVACAINE HCL (PF) 0.25 % IJ SOLN
INTRAMUSCULAR | Status: AC
  Filled 2016-05-08: qty 30

## 2016-05-08 MED ORDER — FENTANYL CITRATE (PF) 100 MCG/2ML IJ SOLN
INTRAMUSCULAR | Status: AC
  Filled 2016-05-08: qty 2

## 2016-05-08 MED ORDER — HEPARIN SOD (PORK) LOCK FLUSH 100 UNIT/ML IV SOLN
INTRAVENOUS | Status: AC
  Filled 2016-05-08: qty 5

## 2016-05-08 MED ORDER — SODIUM CHLORIDE 0.9 % IV SOLN: INTRAVENOUS | Status: DC

## 2016-05-08 MED ORDER — MIDAZOLAM HCL 2 MG/2ML IJ SOLN
INTRAMUSCULAR | Status: AC | PRN
  Administered 2016-05-08 (×2): 1 mg via INTRAVENOUS

## 2016-05-08 MED ORDER — HEPARIN SOD (PORK) LOCK FLUSH 100 UNIT/ML IV SOLN: INTRAVENOUS | Status: AC | PRN

## 2016-05-08 MED ORDER — BUPIVACAINE HCL (PF) 0.5 % IJ SOLN
INTRAMUSCULAR | Status: AC | PRN
  Administered 2016-05-08: 30 mL

## 2016-05-08 MED ORDER — TOBRAMYCIN SULFATE 1.2 G IJ SOLR
INTRAMUSCULAR | Status: AC
  Filled 2016-05-08: qty 1.2

## 2016-05-08 MED ORDER — IOPAMIDOL (ISOVUE-300) INJECTION 61%
INTRAVENOUS | Status: AC
  Filled 2016-05-08: qty 50

## 2016-05-08 NOTE — Sedation Documentation (Signed)
Patient is resting comfortably. 

## 2016-05-08 NOTE — Procedures (Signed)
S/P  T3 and T4 VP with biopsy at both levels.

## 2016-05-08 NOTE — Progress Notes (Signed)
Oncology Nurse Navigator Documentation  Oncology Nurse Navigator Flowsheets 05/08/2016  Navigator Location [facility]  Navigator Encounter Type Other/per Dr. Burr Medico order to be placed.  I called IR at [facility] and received clarification of order.    Barriers/Navigation Needs Coordination of Care  Interventions Coordination of Care  Acuity Level 2  Acuity Level 2 Other  Time Spent with Patient 15

## 2016-05-08 NOTE — Discharge Instructions (Signed)
1.No stooping,bending or lifting more than 10 lbs for 2 weeks. 2.Use walker to ambulate  For 2 weeks . 3.RTC PRN  2weeks . 4.Call her Oncologist tomorrow for results of the biopsy.   Percutaneous Vertebroplasty, Care After These instructions give you information on caring for yourself after your procedure. Your doctor may also give you more specific instructions. Call your doctor if you have any problems or questions after your procedure. HOME CARE  Take medicine as told by your doctor.  Keep your wound dry and covered for 24 hours or as told by your doctor.  Ask your doctor when you can bathe or shower.  Put an ice pack on your wound.  Put ice in a plastic bag.  Place a towel between your skin and the bag.  Leave the ice on for 15-20 minutes, 3-4 times a day.  Rest in your bed for 24 hours or as told by your doctor.  Return to normal activities as told by your doctor.  Ask your doctor what stretches and exercises you can do.  Do not bend or lift anything heavy as told by your doctor. GET HELP IF:  Your wound becomes red, puffy (swollen), or tender to the touch.  You are bleeding or leaking fluid from the wound.  You are sick to your stomach (nauseous) or throw up (vomit) for more than 24 hours after the procedure.  Your back pain does not get better.  You have a fever. GET HELP RIGHT AWAY IF:   You have bad back pain that comes on suddenly.  You cannot control when you pee (urinate) or poop (bowel movement).  You lose feeling (numbness) or have tingling in your legs or feet, or they become weak.  You have sudden weakness in your arms or legs.  You have shooting pain down your legs.  You have chest pain or a hard time breathing.  You feel dizzy or pass out (faint).  Your vision changes or you cannot talk as you normally do.   This information is not intended to replace advice given to you by your health care provider. Make sure you discuss any questions you  have with your health care provider.   Document Released: 10/08/2009 Document Revised: 07/19/2013 Document Reviewed: 03/22/2013 Elsevier Interactive Patient Education [employer].

## 2016-05-08 NOTE — Progress Notes (Signed)
Spoke to Dr. Burr Medico (MD on call for Medical Oncology) regarding biopsy orders for both T-3 and T-4 surg/path biopsy orders at 12:05pm. Orders were entered and specimens were delivered to lab by [facility] ([facility]).

## 2016-05-08 NOTE — Progress Notes (Signed)
Referring Physician(s): Mohamed,M  Supervising Physician: Luanne Bras  Patient Status:  OUTPATIENT  Chief Complaint:  Back pain, thoracic compression fractures  Subjective: Pt familiar to IR service from prior port a cath placement and left lung mass biopsy in 2016. She has a history of stage IV [place] lung cancer with prior chemoradiation/immunotherapy. She has also been experiencing upper to mid back pain since June of this year which persists despite medication use. Recent imaging has revealed compression fractures at T3/T4, possible pathologic in origin.She presents today following consultation with Dr. Estanislado Pandy on 05/02/16 for T3/T4 vertebroplasty/kyphoplasty with possible biopsy. She currently denies fever,HA, abd pain,N/V or bleeding. She does have radiation of upper back pain to mid chest region, some dyspnea and occ cough. Additional hx as below.  Past Medical History:  Diagnosis Date  . Adenocarcinoma of left lung, stage 4 ([place]) 07/09/2014  . Compression fracture of thoracic vertebra (HCC) 04/21/2016  . COPD (chronic obstructive pulmonary disease) (HCC)    Albuterol neb as needed;Pulmicort neb daily  . Encounter for antineoplastic chemotherapy 01/21/2015  . Full code status 05/01/2015  . GERD (gastroesophageal reflux disease)    takes Pantoprazole and Zantac daily  . History of bronchitis    >5 yrs ago  . History of migraine    last one 2 wks ago  . Hx of radiation therapy 10/29/2015 to 11/16/2015   The Left upper lobe was treated to 37.5 Gy in 15 fractions at 2.5 Gy per fraction  . Lung mass   . Panic attacks    but doesn't take any meds  . Pneumonia    hx of-last time about 4+yrs ago   Past Surgical History:  Procedure Laterality Date  . ABDOMINAL HYSTERECTOMY    . APPENDECTOMY    . OOPHORECTOMY    . PORTACATH PLACEMENT  jan. 2016  . TONSILLECTOMY    . VIDEO BRONCHOSCOPY WITH ENDOBRONCHIAL NAVIGATION N/A 08/14/2014   Procedure: VIDEO BRONCHOSCOPY WITH  ENDOBRONCHIAL NAVIGATION;  Surgeon: Rigoberto Noel, MD;  Location: [facility];  Service: Thoracic;  Laterality: N/A;      Allergies: Ativan [lorazepam] and Cinnamon  Medications: Prior to Admission medications   Medication Sig Start Date End Date Taking? Authorizing Provider  acidophilus (RISAQUAD) CAPS capsule Take 1 capsule by mouth daily.   Yes Historical Provider, MD  albuterol (PROVENTIL HFA;VENTOLIN HFA) 108 (90 Base) MCG/ACT inhaler Inhale 2 puffs into the lungs every 6 (six) hours as needed for wheezing or shortness of breath. 04/05/16  Yes Jessica U Vann, DO  albuterol (PROVENTIL) (2.5 MG/3ML) 0.083% nebulizer solution Take 3 mLs (2.5 mg total) by nebulization every 6 (six) hours as needed for wheezing or shortness of breath. 06/13/15  Yes Rigoberto Noel, MD  ALPRAZolam Duanne Moron) 0.5 MG tablet Take 0.5 mg by mouth at bedtime as needed for sleep or anxiety. 03/20/15  Yes Historical Provider, MD  aspirin-acetaminophen-caffeine (EXCEDRIN MIGRAINE) [phone] MG per tablet Take 2 tablets by mouth every 6 (six) hours as needed for headache.   Yes Historical Provider, MD  diazepam (VALIUM) 2 MG tablet Take 1 tablet (2 mg total) by mouth every 6 (six) hours as needed for anxiety. 04/14/16  Yes Rigoberto Noel, MD  diphenhydrAMINE (BENADRYL) 25 mg capsule Take 25 mg by mouth every 6 (six) hours as needed for itching. Reported on 01/19/2016   Yes Historical Provider, MD  hydrocortisone 1 % ointment Apply 1 application topically 2 (two) times daily. Patient taking differently: Apply 1 application topically 2 (two)  times daily as needed.  01/08/16  Yes Curt Bears, MD  ipratropium-albuterol (DUONEB) 0.5-2.5 (3) MG/3ML SOLN Take 3 mLs by nebulization 3 (three) times daily. Patient taking differently: Take 3 mLs by nebulization every 6 (six) hours as needed.  03/04/16  Yes Theodis Blaze, MD  Lidocaine (ASPERCREME LIDOCAINE) 4 % PTCH Apply 1 patch topically daily as needed. Place on back as needed for pain   Yes  Historical Provider, MD  lidocaine-prilocaine (EMLA) cream Apply 1 application topically as needed (prior to accessing port).   Yes Historical Provider, MD  mirtazapine (REMERON) 30 MG tablet Take 15 mg by mouth at bedtime as needed (for sleep).    Yes Historical Provider, MD  ranitidine (ZANTAC) 150 MG tablet Take 150 mg by mouth 2 (two) times daily.    Yes Historical Provider, MD  traMADol (ULTRAM) 50 MG tablet Take 1 tablet (50 mg total) by mouth every 12 (twelve) hours as needed for moderate pain. 04/14/16  Yes Rigoberto Noel, MD  umeclidinium-vilanterol (ANORO ELLIPTA) 62.5-25 MCG/INH AEPB Inhale 1 puff into the lungs daily. 04/14/16  Yes Rigoberto Noel, MD     Vital Signs: BP 131/85   Pulse (!) 103   Temp 99.3 F (37.4 C)   Resp 18   Ht '5\' 5"'$  (1.651 m)   Wt 196 lb (88.9 kg)   SpO2 94%   BMI 32.62 kg/m   Physical Exam awake/alert; chest- distant BS bilat, occ wheeze; heart- sl tachy but reg rhythm; abd- soft,+BS,NT; LE- tr- 1+ edema bilat; mod paravertebral tenderness T3-4 region  Imaging: No results found.  Labs:  CBC:  Recent Labs  04/01/16 1314 04/02/16 0338 04/03/16 0440 04/10/16 0947  WBC 5.6 3.7* 9.9 10.6*  HGB 12.7 11.5* 11.3* 13.5  HCT 40.0 35.3* 34.9* 40.7  PLT 337 282 286 273    COAGS:  Recent Labs  01/18/16 1703  INR 1.08  APTT 33    BMP:  Recent Labs  03/03/16 0404 04/01/16 1314 04/02/16 0338 04/03/16 0440 04/10/16 0948  NA 135 142 141 140 140  K 4.4 3.5 3.3* 3.9 4.3  CL 99* 106 103 102  --   CO2 '28 25 28 31 22  '$ GLUCOSE 128* 191* 131* 136* 88  BUN '20 14 16 15 '$ 9.0  CALCIUM 9.2 8.6* 8.7* 9.0 9.0  CREATININE 0.53 0.70 0.48 0.59 0.7  GFRNONAA >60 >60 >60 >60  --   GFRAA >60 >60 >60 >60  --     LIVER FUNCTION TESTS:  Recent Labs  01/18/16 1703 02/26/16 0917 04/02/16 0338 04/10/16 0948  BILITOT 0.3 0.3 0.5 <0.30  AST '25 16 17 9  '$ ALT '23 12 16 21  '$ ALKPHOS 140* 139* 115 87  PROT 7.4 7.2 7.0 6.6  ALBUMIN 3.8 4.3 3.6 3.3*     Assessment and Plan: Pt with history of stage IV [place] lung cancer with prior chemoradiation/immunotherapy. She has also been experiencing upper to mid back pain since June of this year which persists despite medication use. Recent imaging has revealed compression fractures at T3/T4, possible pathologic in origin.She presents today following consultation with Dr. Estanislado Pandy on 05/02/16 for T3/T4 vertebroplasty/kyphoplasty with possible biopsy.Risks and benefits discussed with the patient/daughter including, but not limited to education regarding the natural healing process of compression fractures without intervention, bleeding, infection, cement migration which may cause spinal cord damage, paralysis, pulmonary embolism or even death.All of the patient's questions were answered, patient is agreeable to proceed.Consent signed and in chart.Labs pending.  Electronically Signed: D. Rowe Robert 05/08/2016, 8:37 AM   I spent a total of 25 minutes at the the patient's bedside AND on the patient's hospital floor or unit, greater than 50% of which was counseling/coordinating care for thoracic vertebroplasty/kyphoplasty    Patient ID: Heather Banks, female   DOB: [birth year], 65 y.o.   MRN: [id number]

## 2016-05-09 ENCOUNTER — Encounter (HOSPITAL_COMMUNITY): Payer: Self-pay | Admitting: Interventional Radiology

## 2016-05-13 ENCOUNTER — Other Ambulatory Visit (HOSPITAL_COMMUNITY): Payer: Self-pay | Admitting: Interventional Radiology

## 2016-05-13 DIAGNOSIS — S22000A Wedge compression fracture of unspecified thoracic vertebra, initial encounter for closed fracture: Secondary | ICD-10-CM

## 2016-05-14 ENCOUNTER — Other Ambulatory Visit: Payer: Self-pay | Admitting: Pulmonary Disease

## 2016-05-17 ENCOUNTER — Emergency Department (HOSPITAL_COMMUNITY): Payer: Medicaid - Out of State

## 2016-05-17 ENCOUNTER — Inpatient Hospital Stay (HOSPITAL_COMMUNITY)
Admission: EM | Admit: 2016-05-17 | Discharge: 2016-05-24 | DRG: 190 | Disposition: A | Discharge status: Home Health | Payer: Medicaid - Out of State | Attending: Nephrology | Admitting: Nephrology

## 2016-05-17 DIAGNOSIS — Z91018 Allergy to other foods: Secondary | ICD-10-CM

## 2016-05-17 DIAGNOSIS — Z87891 Personal history of nicotine dependence: Secondary | ICD-10-CM

## 2016-05-17 DIAGNOSIS — R739 Hyperglycemia, unspecified: Secondary | ICD-10-CM | POA: Clinically undetermined

## 2016-05-17 DIAGNOSIS — R7989 Other specified abnormal findings of blood chemistry: Secondary | ICD-10-CM | POA: Diagnosis present

## 2016-05-17 DIAGNOSIS — R030 Elevated blood-pressure reading, without diagnosis of hypertension: Secondary | ICD-10-CM

## 2016-05-17 DIAGNOSIS — Z923 Personal history of irradiation: Secondary | ICD-10-CM

## 2016-05-17 DIAGNOSIS — I10 Essential (primary) hypertension: Secondary | ICD-10-CM | POA: Diagnosis present

## 2016-05-17 DIAGNOSIS — M4854XA Collapsed vertebra, not elsewhere classified, thoracic region, initial encounter for fracture: Secondary | ICD-10-CM | POA: Diagnosis present

## 2016-05-17 DIAGNOSIS — J9620 Acute and chronic respiratory failure, unspecified whether with hypoxia or hypercapnia: Secondary | ICD-10-CM | POA: Diagnosis present

## 2016-05-17 DIAGNOSIS — Z66 Do not resuscitate: Secondary | ICD-10-CM | POA: Diagnosis present

## 2016-05-17 DIAGNOSIS — Z825 Family history of asthma and other chronic lower respiratory diseases: Secondary | ICD-10-CM

## 2016-05-17 DIAGNOSIS — J441 Chronic obstructive pulmonary disease with (acute) exacerbation: Principal | ICD-10-CM | POA: Diagnosis present

## 2016-05-17 DIAGNOSIS — Z9981 Dependence on supplemental oxygen: Secondary | ICD-10-CM

## 2016-05-17 DIAGNOSIS — Z23 Encounter for immunization: Secondary | ICD-10-CM

## 2016-05-17 DIAGNOSIS — J9690 Respiratory failure, unspecified, unspecified whether with hypoxia or hypercapnia: Secondary | ICD-10-CM | POA: Diagnosis present

## 2016-05-17 DIAGNOSIS — R946 Abnormal results of thyroid function studies: Secondary | ICD-10-CM | POA: Diagnosis present

## 2016-05-17 DIAGNOSIS — Z9221 Personal history of antineoplastic chemotherapy: Secondary | ICD-10-CM

## 2016-05-17 DIAGNOSIS — J9621 Acute and chronic respiratory failure with hypoxia: Secondary | ICD-10-CM | POA: Diagnosis present

## 2016-05-17 DIAGNOSIS — T380X5A Adverse effect of glucocorticoids and synthetic analogues, initial encounter: Secondary | ICD-10-CM | POA: Diagnosis present

## 2016-05-17 DIAGNOSIS — Z515 Encounter for palliative care: Secondary | ICD-10-CM

## 2016-05-17 DIAGNOSIS — C3410 Malignant neoplasm of upper lobe, unspecified bronchus or lung: Secondary | ICD-10-CM | POA: Diagnosis present

## 2016-05-17 DIAGNOSIS — R0600 Dyspnea, unspecified: Secondary | ICD-10-CM

## 2016-05-17 DIAGNOSIS — K219 Gastro-esophageal reflux disease without esophagitis: Secondary | ICD-10-CM | POA: Diagnosis present

## 2016-05-17 DIAGNOSIS — Z888 Allergy status to other drugs, medicaments and biological substances status: Secondary | ICD-10-CM

## 2016-05-17 DIAGNOSIS — Z86711 Personal history of pulmonary embolism: Secondary | ICD-10-CM | POA: Diagnosis present

## 2016-05-17 DIAGNOSIS — J9622 Acute and chronic respiratory failure with hypercapnia: Secondary | ICD-10-CM | POA: Diagnosis present

## 2016-05-17 DIAGNOSIS — C3412 Malignant neoplasm of upper lobe, left bronchus or lung: Secondary | ICD-10-CM | POA: Diagnosis present

## 2016-05-17 DIAGNOSIS — Z79899 Other long term (current) drug therapy: Secondary | ICD-10-CM

## 2016-05-17 LAB — CBC WITH DIFFERENTIAL/PLATELET
BASOS ABS: 0.1 10*3/uL (ref 0.0–0.1)
BASOS PCT: 1 %
EOS ABS: 0.4 10*3/uL (ref 0.0–0.7)
Eosinophils Relative: 3 %
HEMATOCRIT: 42.4 % (ref 36.0–46.0)
HEMOGLOBIN: 13.8 g/dL (ref 12.0–15.0)
Lymphocytes Relative: 7 %
Lymphs Abs: 0.9 10*3/uL (ref 0.7–4.0)
MCH: 31.3 pg (ref 26.0–34.0)
MCHC: 32.5 g/dL (ref 30.0–36.0)
MCV: 96.1 fL (ref 78.0–100.0)
Monocytes Absolute: 0.5 10*3/uL (ref 0.1–1.0)
Monocytes Relative: 4 %
NEUTROS ABS: 10.9 10*3/uL — AB (ref 1.7–7.7)
NEUTROS PCT: 85 %
Platelets: 359 10*3/uL (ref 150–400)
RBC: 4.41 MIL/uL (ref 3.87–5.11)
RDW: 17.1 % — ABNORMAL HIGH (ref 11.5–15.5)
WBC: 12.6 10*3/uL — AB (ref 4.0–10.5)

## 2016-05-17 LAB — BASIC METABOLIC PANEL
ANION GAP: 10 (ref 5–15)
BUN: 14 mg/dL (ref 6–20)
CHLORIDE: 108 mmol/L (ref 101–111)
CO2: 23 mmol/L (ref 22–32)
Calcium: 8.9 mg/dL (ref 8.9–10.3)
Creatinine, Ser: 0.57 mg/dL (ref 0.44–1.00)
GFR calc Af Amer: >60 mL/min (ref >60)
Glucose, Bld: 190 mg/dL — ABNORMAL HIGH (ref 65–99)
POTASSIUM: 4.1 mmol/L (ref 3.5–5.1)
SODIUM: 141 mmol/L (ref 135–145)

## 2016-05-17 LAB — I-STAT TROPONIN, ED: TROPONIN I, POC: 0 ng/mL (ref 0.00–0.08)

## 2016-05-17 MED ORDER — ALBUTEROL (5 MG/ML) CONTINUOUS INHALATION SOLN
INHALATION_SOLUTION | RESPIRATORY_TRACT | Status: AC
  Filled 2016-05-17: qty 20

## 2016-05-17 MED ORDER — ALBUTEROL (5 MG/ML) CONTINUOUS INHALATION SOLN
10.0000 mg/h | INHALATION_SOLUTION | RESPIRATORY_TRACT | Status: DC
  Administered 2016-05-17: 10 mg/h via RESPIRATORY_TRACT

## 2016-05-17 MED ORDER — ALBUTEROL SULFATE (2.5 MG/3ML) 0.083% IN NEBU
5.0000 mg | INHALATION_SOLUTION | Freq: Once | RESPIRATORY_TRACT | Status: AC
  Administered 2016-05-17: 5 mg via RESPIRATORY_TRACT

## 2016-05-17 MED ORDER — ALBUTEROL SULFATE (2.5 MG/3ML) 0.083% IN NEBU
INHALATION_SOLUTION | RESPIRATORY_TRACT | Status: AC
  Filled 2016-05-17: qty 6

## 2016-05-17 MED ORDER — MAGNESIUM SULFATE 2 GM/50ML IV SOLN
2.0000 g | Freq: Once | INTRAVENOUS | Status: AC
  Administered 2016-05-18: 2 g via INTRAVENOUS
  Filled 2016-05-17: qty 50

## 2016-05-17 MED ORDER — ALBUTEROL SULFATE (2.5 MG/3ML) 0.083% IN NEBU: 5.0000 mg | INHALATION_SOLUTION | Freq: Once | RESPIRATORY_TRACT | Status: DC

## 2016-05-17 NOTE — ED Provider Notes (Signed)
[facility] DEPT Provider Note   CSN: [id number] Arrival date & time: 05/17/16  2114     History   Chief Complaint Chief Complaint  Patient presents with  . Shortness of Breath  . Respiratory Distress    HPI Heather Banks is a 65 y.o. female.  Patient presents with shortness of breath and respiratory distress. She has a history of adenocarcinoma of the left lung, COPD and vertebral compression fractures. She presents with a 2 day history of worsening shortness of breath and wheezing. She's had a nonproductive cough. She denies any known fevers. She denies any chest pain. She denies any nausea or vomiting. She's been using her nebulizer treatments at home without improvement in symptoms. She was recently admitted in early September and August for similar symptoms. Her daughter is her power of attorney. She states that the patient has a living will which is at home and states that patient does not want intubation.  She is not, however, a full DNR. History is limited due to patient's respiratory distress.  It does look like she had a small PE and was on Xarelto, but stopped this in September.    Shortness of Breath  Associated symptoms include cough and wheezing. Pertinent negatives include no fever, no headaches, no rhinorrhea, no chest pain, no vomiting, no abdominal pain, no rash and no leg swelling.    Past Medical History:  Diagnosis Date  . Adenocarcinoma of left lung, stage 4 ([place]) 07/09/2014  . Compression fracture of thoracic vertebra (HCC) 04/21/2016  . COPD (chronic obstructive pulmonary disease) (HCC)    Albuterol neb as needed;Pulmicort neb daily  . Encounter for antineoplastic chemotherapy 01/21/2015  . Full code status 05/01/2015  . GERD (gastroesophageal reflux disease)    takes Pantoprazole and Zantac daily  . History of bronchitis    >5 yrs ago  . History of migraine    last one 2 wks ago  . Hx of radiation therapy 10/29/2015 to 11/16/2015   The Left upper lobe  was treated to 37.5 Gy in 15 fractions at 2.5 Gy per fraction  . Lung mass   . Panic attacks    but doesn't take any meds  . Pneumonia    hx of-last time about 4+yrs ago    Patient Active Problem List   Diagnosis Date Noted  . Compression fracture of thoracic vertebra (HCC) 04/21/2016  . HCAP (healthcare-associated pneumonia) 04/01/2016  . Vertebral fracture, pathological 02/28/2016  . GERD (gastroesophageal reflux disease) 02/28/2016  . COPD with acute exacerbation ([place]) 02/28/2016  . History of pulmonary embolus (PE) 02/06/2016  . Respiratory failure ([place]) 01/18/2016  . Primary cancer of left upper lobe of lung ([place]) 10/24/2015  . Drug-induced skin rash 07/31/2015  . Encounter for antineoplastic immunotherapy 06/19/2015  . Chronic fatigue 05/22/2015  . Nausea without vomiting 05/22/2015  . Full code status 05/01/2015  . COPD (chronic obstructive pulmonary disease) ([employer]) 02/05/2015  . Encounter for antineoplastic chemotherapy 01/21/2015  . Depression (emotion) 09/05/2014  . Generalized anxiety disorder 08/21/2014  . Smoking 07/11/2014  . Adenocarcinoma of left lung, stage 4 ([place]) 07/09/2014    Past Surgical History:  Procedure Laterality Date  . ABDOMINAL HYSTERECTOMY    . APPENDECTOMY    . IR GENERIC HISTORICAL  05/02/2016   IR RADIOLOGIST EVAL & MGMT 05/02/2016 [facility]  . IR GENERIC HISTORICAL  05/08/2016   IR VERTEBROPLASTY EA ADDL (T&LS) BX INC UNI/BIL INC INJECT/IMAGING 05/08/2016 Luanne Bras, MD [facility]  . IR GENERIC HISTORICAL  05/08/2016   IR VERTEBROPLASTY CERV/THOR BX INC UNI/BIL INC/INJECT/IMAGING 05/08/2016 Luanne Bras, MD [facility]  . OOPHORECTOMY    . PORTACATH PLACEMENT  jan. 2016  . TONSILLECTOMY    . VIDEO BRONCHOSCOPY WITH ENDOBRONCHIAL NAVIGATION N/A 08/14/2014   Procedure: VIDEO BRONCHOSCOPY WITH ENDOBRONCHIAL NAVIGATION;  Surgeon: Rigoberto Noel, MD;  Location: [facility];  Service: Thoracic;  Laterality: N/A;    OB History     No data available       Home Medications    Prior to Admission medications   Medication Sig Start Date End Date Taking? Authorizing Provider  acidophilus (RISAQUAD) CAPS capsule Take 1 capsule by mouth daily.    Historical Provider, MD  albuterol (PROVENTIL HFA;VENTOLIN HFA) 108 (90 Base) MCG/ACT inhaler Inhale 2 puffs into the lungs every 6 (six) hours as needed for wheezing or shortness of breath. 04/05/16   Geradine Girt, DO  albuterol (PROVENTIL) (2.5 MG/3ML) 0.083% nebulizer solution Take 3 mLs (2.5 mg total) by nebulization every 6 (six) hours as needed for wheezing or shortness of breath. 06/13/15   Rigoberto Noel, MD  ALPRAZolam Duanne Moron) 0.5 MG tablet Take 0.5 mg by mouth at bedtime as needed for sleep or anxiety. 03/20/15   Historical Provider, MD  aspirin-acetaminophen-caffeine (EXCEDRIN MIGRAINE) [phone] MG per tablet Take 2 tablets by mouth every 6 (six) hours as needed for headache.    Historical Provider, MD  diazepam (VALIUM) 2 MG tablet Take 1 tablet (2 mg total) by mouth every 6 (six) hours as needed for anxiety. 04/14/16   Rigoberto Noel, MD  diphenhydrAMINE (BENADRYL) 25 mg capsule Take 25 mg by mouth every 6 (six) hours as needed for itching. Reported on 01/19/2016    Historical Provider, MD  hydrocortisone 1 % ointment Apply 1 application topically 2 (two) times daily. Patient taking differently: Apply 1 application topically 2 (two) times daily as needed.  01/08/16   Curt Bears, MD  ipratropium-albuterol (DUONEB) 0.5-2.5 (3) MG/3ML SOLN Take 3 mLs by nebulization 3 (three) times daily. Patient taking differently: Take 3 mLs by nebulization every 6 (six) hours as needed.  03/04/16   Theodis Blaze, MD  Lidocaine (ASPERCREME LIDOCAINE) 4 % PTCH Apply 1 patch topically daily as needed. Place on back as needed for pain    Historical Provider, MD  lidocaine-prilocaine (EMLA) cream Apply 1 application topically as needed (prior to accessing port).    Historical Provider, MD    mirtazapine (REMERON) 30 MG tablet Take 15 mg by mouth at bedtime as needed (for sleep).     Historical Provider, MD  ranitidine (ZANTAC) 150 MG tablet Take 150 mg by mouth 2 (two) times daily.     Historical Provider, MD  traMADol (ULTRAM) 50 MG tablet Take 1 tablet (50 mg total) by mouth every 12 (twelve) hours as needed for moderate pain. 04/14/16   Rigoberto Noel, MD  umeclidinium-vilanterol (ANORO ELLIPTA) 62.5-25 MCG/INH AEPB Inhale 1 puff into the lungs daily. 04/14/16   Rigoberto Noel, MD    Family History Family History  Problem Relation Age of Onset  . COPD Father     Social History Social History  Substance Use Topics  . Smoking status: Former Smoker    Packs/day: 0.25    Years: 44.00    Types: Cigarettes    Quit date: 03/14/2016  . Smokeless tobacco: Never Used  . Alcohol use No     Comment: no alcohol in 7 yrs      Allergies  Ativan [lorazepam] and Cinnamon   Review of Systems Review of Systems  Constitutional: Positive for fatigue. Negative for chills, diaphoresis and fever.  HENT: Negative for congestion, rhinorrhea and sneezing.   Eyes: Negative.   Respiratory: Positive for cough, shortness of breath and wheezing. Negative for chest tightness.   Cardiovascular: Negative for chest pain and leg swelling.  Gastrointestinal: Negative for abdominal pain, blood in stool, diarrhea, nausea and vomiting.  Genitourinary: Negative for difficulty urinating, flank pain, frequency and hematuria.  Musculoskeletal: Negative for arthralgias and back pain.  Skin: Negative for rash.  Neurological: Negative for dizziness, speech difficulty, weakness, numbness and headaches.     Physical Exam Updated Vital Signs BP (!) 163/117 (BP Location: Left Arm)   Pulse 119   Resp 21   SpO2 95%   Physical Exam  Constitutional: She is oriented to person, place, and time. She appears well-developed and well-nourished. She appears distressed.  HENT:  Head: Normocephalic and  atraumatic.  Eyes: Pupils are equal, round, and reactive to light.  Neck: Normal range of motion. Neck supple.  Cardiovascular: Normal rate, regular rhythm and normal heart sounds.   Pulmonary/Chest: She is in respiratory distress. She has wheezes. She has no rales. She exhibits no tenderness.  Diminished breath sounds bilaterally with marked increased work of breathing. She's only able to talk in very short sentences.  Abdominal: Soft. Bowel sounds are normal. There is no tenderness. There is no rebound and no guarding.  Musculoskeletal: Normal range of motion. She exhibits no edema.  Lymphadenopathy:    She has no cervical adenopathy.  Neurological: She is alert and oriented to person, place, and time.  Skin: Skin is warm and dry. No rash noted.  Psychiatric: She has a normal mood and affect.     ED Treatments / Results  Labs (all labs ordered are listed, but only abnormal results are displayed) Labs Reviewed  BASIC METABOLIC PANEL - Abnormal; Notable for the following:       Result Value   Glucose, Bld 190 (*)    All other components within normal limits  CBC WITH DIFFERENTIAL/PLATELET - Abnormal; Notable for the following:    WBC 12.6 (*)    RDW 17.1 (*)    Neutro Abs 10.9 (*)    All other components within normal limits  BLOOD GAS, ARTERIAL - Abnormal; Notable for the following:    pH, Arterial 7.226 (*)    pCO2 arterial 62.2 (*)    pO2, Arterial 135 (*)    Acid-base deficit 3.6 (*)    All other components within normal limits  I-STAT TROPOININ, ED    EKG  EKG Interpretation None      ED ECG REPORT   Date: 05/17/2016  Rate: 125  Rhythm: sinus tachycardia  QRS Axis: normal  Intervals: normal  ST/T Wave abnormalities: nonspecific ST/T changes  Conduction Disutrbances:none  Narrative Interpretation:   Old EKG Reviewed: changes noted  I have personally reviewed the EKG tracing and agree with the computerized printout as noted.  Radiology Dg Chest Port 1  View  Result Date: 05/17/2016 CLINICAL DATA:  Dyspnea after breathing treatment. History of adenocarcinoma of the left lung, COPD and pneumonia. EXAM: PORTABLE CHEST 1 VIEW COMPARISON:  04/10/2016 chest CT, 04/01/2016 chest radiograph FINDINGS: Cardiac silhouette is normal for size. Porta catheter tip is seen in the distal SVC. In the aortic atherosclerosis without aneurysm. Mild vascular congestion noted. Masslike opacity at the left lung base consistent with known left upper lobe neoplasm. This  appears slightly more prominent than on prior 04/01/2016 exam though margins are difficult to ascertain given overlap from adjacent ribs. Minimal scarring at the right upper lobe. IMPRESSION: Subjectively more prominent left upper lobe mass. Mild pulmonary vascular congestion. No acute pneumonic consolidation. Electronically Signed   By: Ashley Royalty M.D.   On: 05/17/2016 23:13    Procedures Procedures (including critical care time)  Medications Ordered in ED Medications  albuterol (PROVENTIL,VENTOLIN) solution continuous neb (10 mg/hr Nebulization New Bag/Given 05/17/16 2206)  albuterol (PROVENTIL, VENTOLIN) (5 MG/ML) 0.5% continuous inhalation solution (  Not Given 05/17/16 2301)  magnesium sulfate IVPB 2 g 50 mL (2 g Intravenous New Bag/Given 05/18/16 0000)  ceFEPIme (MAXIPIME) 1 g in dextrose 5 % 50 mL IVPB (not administered)  vancomycin (VANCOCIN) IVPB 1000 mg/200 mL premix (not administered)  albuterol (PROVENTIL) (2.5 MG/3ML) 0.083% nebulizer solution 5 mg (5 mg Nebulization Given 05/17/16 2146)  albuterol (PROVENTIL) (2.5 MG/3ML) 0.083% nebulizer solution (  Return to [facility] 05/17/16 2151)     Initial Impression / Assessment and Plan / ED Course  I have reviewed the triage vital signs and the nursing notes.  Pertinent labs & imaging results that were available during my care of the patient were reviewed by me and considered in my medical decision making (see chart for details).  Clinical  Course  Comment By Time  Pt has received an albuterol neb as well as a continuous neb without improvement in symptoms. Will give magnesium and started on BiPAP. Malvin Johns, MD 10/21 2326    Patient presents in respiratory distress. She had minimal improvement with nebs and was started on BiPAP. She is also given magnesium. She received Solu-Medrol prior to arrival by EMS. She is doing better on the BiPAP. She will go to sleep but will wake up and answer questions appropriately. Chest x-ray doesn't show evidence of pneumonia. Dr. Roel Cluck with the hospitalist service was consulted and will admit the patient. Her initial ABG showed an elevated PCO2. Her settings were adjusted on the BiPAP.  CRITICAL CARE Performed by: Daisuke Bailey Total critical care time: 45 minutes Critical care time was exclusive of separately billable procedures and treating other patients. Critical care was necessary to treat or prevent imminent or life-threatening deterioration. Critical care was time spent personally by me on the following activities: development of treatment plan with patient and/or surrogate as well as nursing, discussions with consultants, evaluation of patient's response to treatment, examination of patient, obtaining history from patient or surrogate, ordering and performing treatments and interventions, ordering and review of laboratory studies, ordering and review of radiographic studies, pulse oximetry and re-evaluation of patient's condition.   Final Clinical Impressions(s) / ED Diagnoses   Final diagnoses:  COPD exacerbation ([place])    New Prescriptions New Prescriptions   No medications on file     Malvin Johns, MD 05/18/16 [phone]

## 2016-05-17 NOTE — ED Triage Notes (Signed)
Patient arrived with EMS from home complain of SOB for the past 2 days, worsening this afternoon, per EMS patient was given albuterol , Atrovent and '125mg'$  IV solumedrol, and 2g of Mg IV, Pt remains respiratory distress, wheezing both lungs. o2sat on 90's on 4L o2.

## 2016-05-18 ENCOUNTER — Encounter (HOSPITAL_COMMUNITY): Payer: Self-pay

## 2016-05-18 ENCOUNTER — Inpatient Hospital Stay (HOSPITAL_COMMUNITY): Payer: Medicaid - Out of State

## 2016-05-18 DIAGNOSIS — J441 Chronic obstructive pulmonary disease with (acute) exacerbation: Secondary | ICD-10-CM | POA: Diagnosis present

## 2016-05-18 DIAGNOSIS — R946 Abnormal results of thyroid function studies: Secondary | ICD-10-CM | POA: Diagnosis present

## 2016-05-18 DIAGNOSIS — Z79899 Other long term (current) drug therapy: Secondary | ICD-10-CM | POA: Diagnosis not present

## 2016-05-18 DIAGNOSIS — Z515 Encounter for palliative care: Secondary | ICD-10-CM

## 2016-05-18 DIAGNOSIS — I10 Essential (primary) hypertension: Secondary | ICD-10-CM | POA: Diagnosis present

## 2016-05-18 DIAGNOSIS — C3412 Malignant neoplasm of upper lobe, left bronchus or lung: Secondary | ICD-10-CM

## 2016-05-18 DIAGNOSIS — Z87891 Personal history of nicotine dependence: Secondary | ICD-10-CM | POA: Diagnosis not present

## 2016-05-18 DIAGNOSIS — Z7189 Other specified counseling: Secondary | ICD-10-CM

## 2016-05-18 DIAGNOSIS — Z23 Encounter for immunization: Secondary | ICD-10-CM | POA: Diagnosis not present

## 2016-05-18 DIAGNOSIS — C3410 Malignant neoplasm of upper lobe, unspecified bronchus or lung: Secondary | ICD-10-CM | POA: Diagnosis present

## 2016-05-18 DIAGNOSIS — J9621 Acute and chronic respiratory failure with hypoxia: Secondary | ICD-10-CM

## 2016-05-18 DIAGNOSIS — R0600 Dyspnea, unspecified: Secondary | ICD-10-CM

## 2016-05-18 DIAGNOSIS — Z825 Family history of asthma and other chronic lower respiratory diseases: Secondary | ICD-10-CM | POA: Diagnosis not present

## 2016-05-18 DIAGNOSIS — T380X5A Adverse effect of glucocorticoids and synthetic analogues, initial encounter: Secondary | ICD-10-CM | POA: Diagnosis present

## 2016-05-18 DIAGNOSIS — R739 Hyperglycemia, unspecified: Secondary | ICD-10-CM

## 2016-05-18 DIAGNOSIS — J9622 Acute and chronic respiratory failure with hypercapnia: Secondary | ICD-10-CM | POA: Diagnosis present

## 2016-05-18 DIAGNOSIS — R7989 Other specified abnormal findings of blood chemistry: Secondary | ICD-10-CM | POA: Diagnosis present

## 2016-05-18 DIAGNOSIS — Z923 Personal history of irradiation: Secondary | ICD-10-CM | POA: Diagnosis not present

## 2016-05-18 DIAGNOSIS — Z888 Allergy status to other drugs, medicaments and biological substances status: Secondary | ICD-10-CM | POA: Diagnosis not present

## 2016-05-18 DIAGNOSIS — Z91018 Allergy to other foods: Secondary | ICD-10-CM | POA: Diagnosis not present

## 2016-05-18 DIAGNOSIS — K219 Gastro-esophageal reflux disease without esophagitis: Secondary | ICD-10-CM | POA: Diagnosis present

## 2016-05-18 DIAGNOSIS — M4854XA Collapsed vertebra, not elsewhere classified, thoracic region, initial encounter for fracture: Secondary | ICD-10-CM | POA: Diagnosis present

## 2016-05-18 DIAGNOSIS — Z9981 Dependence on supplemental oxygen: Secondary | ICD-10-CM | POA: Diagnosis not present

## 2016-05-18 DIAGNOSIS — Z9221 Personal history of antineoplastic chemotherapy: Secondary | ICD-10-CM | POA: Diagnosis not present

## 2016-05-18 DIAGNOSIS — Z66 Do not resuscitate: Secondary | ICD-10-CM | POA: Diagnosis present

## 2016-05-18 DIAGNOSIS — J9620 Acute and chronic respiratory failure, unspecified whether with hypoxia or hypercapnia: Secondary | ICD-10-CM | POA: Diagnosis present

## 2016-05-18 DIAGNOSIS — Z86711 Personal history of pulmonary embolism: Secondary | ICD-10-CM | POA: Diagnosis not present

## 2016-05-18 LAB — BLOOD GAS, ARTERIAL
ACID-BASE DEFICIT: 3.6 mmol/L — AB (ref 0.0–2.0)
Acid-base deficit: 2.6 mmol/L — ABNORMAL HIGH (ref 0.0–2.0)
BICARBONATE: 24.9 mmol/L (ref 20.0–28.0)
Bicarbonate: 24.9 mmol/L (ref 20.0–28.0)
DELIVERY SYSTEMS: POSITIVE
DELIVERY SYSTEMS: POSITIVE
DRAWN BY: 252031
Drawn by: 25203
EXPIRATORY PAP: 5
Expiratory PAP: 5
FIO2: 100
FIO2: 60
Inspiratory PAP: 14
Inspiratory PAP: 14
LHR: 10 {breaths}/min
O2 Saturation: 97.7 %
O2 Saturation: 99.2 %
PATIENT TEMPERATURE: 97
PH ART: 7.275 — AB (ref 7.350–7.450)
PH ART: 7.226 — AB (ref 7.350–7.450)
PO2 ART: 249 mmHg — AB (ref 83.0–108.0)
Patient temperature: 98.6
pCO2 arterial: 54.6 mmHg — ABNORMAL HIGH (ref 32.0–48.0)
pCO2 arterial: 62.2 mmHg — ABNORMAL HIGH (ref 32.0–48.0)
pO2, Arterial: 135 mmHg — ABNORMAL HIGH (ref 83.0–108.0)

## 2016-05-18 LAB — GLUCOSE, CAPILLARY
GLUCOSE-CAPILLARY: 130 mg/dL — AB (ref 65–99)
GLUCOSE-CAPILLARY: 134 mg/dL — AB (ref 65–99)
GLUCOSE-CAPILLARY: 170 mg/dL — AB (ref 65–99)
Glucose-Capillary: 155 mg/dL — ABNORMAL HIGH (ref 65–99)

## 2016-05-18 LAB — COMPREHENSIVE METABOLIC PANEL
ALK PHOS: 97 U/L (ref 38–126)
ALT: 12 U/L — AB (ref 14–54)
AST: 19 U/L (ref 15–41)
Albumin: 3.9 g/dL (ref 3.5–5.0)
Anion gap: 12 (ref 5–15)
BUN: 15 mg/dL (ref 6–20)
CALCIUM: 8.9 mg/dL (ref 8.9–10.3)
CO2: 22 mmol/L (ref 22–32)
CREATININE: 0.6 mg/dL (ref 0.44–1.00)
Chloride: 106 mmol/L (ref 101–111)
Glucose, Bld: 233 mg/dL — ABNORMAL HIGH (ref 65–99)
Potassium: 4 mmol/L (ref 3.5–5.1)
Sodium: 140 mmol/L (ref 135–145)
Total Bilirubin: 0.5 mg/dL (ref 0.3–1.2)
Total Protein: 7.4 g/dL (ref 6.5–8.1)

## 2016-05-18 LAB — TSH: TSH: 0.236 u[IU]/mL — ABNORMAL LOW (ref 0.350–4.500)

## 2016-05-18 LAB — CBC
HCT: 42.4 % (ref 36.0–46.0)
Hemoglobin: 13.1 g/dL (ref 12.0–15.0)
MCH: 30.5 pg (ref 26.0–34.0)
MCHC: 30.9 g/dL (ref 30.0–36.0)
MCV: 98.6 fL (ref 78.0–100.0)
PLATELETS: 332 10*3/uL (ref 150–400)
RBC: 4.3 MIL/uL (ref 3.87–5.11)
RDW: 17.4 % — AB (ref 11.5–15.5)
WBC: 9.6 10*3/uL (ref 4.0–10.5)

## 2016-05-18 LAB — T4, FREE: Free T4: 0.67 ng/dL (ref 0.61–1.12)

## 2016-05-18 LAB — MRSA PCR SCREENING: MRSA BY PCR: NEGATIVE

## 2016-05-18 LAB — MAGNESIUM: Magnesium: 2.4 mg/dL (ref 1.7–2.4)

## 2016-05-18 LAB — PHOSPHORUS: Phosphorus: 2.2 mg/dL — ABNORMAL LOW (ref 2.5–4.6)

## 2016-05-18 MED ORDER — MORPHINE SULFATE (PF) 2 MG/ML IV SOLN: 1.0000 mg | INTRAVENOUS | Status: DC | PRN

## 2016-05-18 MED ORDER — ACETAMINOPHEN 325 MG PO TABS
650.0000 mg | ORAL_TABLET | Freq: Four times a day (QID) | ORAL | Status: DC | PRN
  Filled 2016-05-18: qty 2

## 2016-05-18 MED ORDER — LORATADINE 10 MG PO TABS
10.0000 mg | ORAL_TABLET | Freq: Every day | ORAL | Status: DC
  Administered 2016-05-19 – 2016-05-24 (×6): 10 mg via ORAL
  Filled 2016-05-18 (×6): qty 1

## 2016-05-18 MED ORDER — ORAL CARE MOUTH RINSE
15.0000 mL | Freq: Two times a day (BID) | OROMUCOSAL | Status: DC
  Administered 2016-05-18 – 2016-05-22 (×7): 15 mL via OROMUCOSAL

## 2016-05-18 MED ORDER — IPRATROPIUM BROMIDE 0.02 % IN SOLN
0.5000 mg | Freq: Four times a day (QID) | RESPIRATORY_TRACT | Status: DC
  Administered 2016-05-18: 0.5 mg via RESPIRATORY_TRACT
  Filled 2016-05-18: qty 2.5

## 2016-05-18 MED ORDER — VANCOMYCIN HCL IN DEXTROSE 1-5 GM/200ML-% IV SOLN
1000.0000 mg | Freq: Once | INTRAVENOUS | Status: AC
Start: 2016-05-18 — End: 2016-05-18
  Administered 2016-05-18: 1000 mg via INTRAVENOUS
  Filled 2016-05-18: qty 200

## 2016-05-18 MED ORDER — METHYLPREDNISOLONE SODIUM SUCC 125 MG IJ SOLR: 60.0000 mg | Freq: Three times a day (TID) | INTRAMUSCULAR | Status: DC

## 2016-05-18 MED ORDER — LEVOFLOXACIN IN D5W 750 MG/150ML IV SOLN
750.0000 mg | INTRAVENOUS | Status: DC
  Administered 2016-05-18 – 2016-05-21 (×4): 750 mg via INTRAVENOUS
  Filled 2016-05-18 (×4): qty 150

## 2016-05-18 MED ORDER — BUDESONIDE 0.25 MG/2ML IN SUSP
0.2500 mg | Freq: Two times a day (BID) | RESPIRATORY_TRACT | Status: DC
  Administered 2016-05-18: 0.25 mg via RESPIRATORY_TRACT
  Filled 2016-05-18: qty 2

## 2016-05-18 MED ORDER — GUAIFENESIN ER 600 MG PO TB12: 600.0000 mg | ORAL_TABLET | Freq: Two times a day (BID) | ORAL | Status: DC

## 2016-05-18 MED ORDER — ASPIRIN-ACETAMINOPHEN-CAFFEINE 250-250-65 MG PO TABS
2.0000 | ORAL_TABLET | Freq: Four times a day (QID) | ORAL | Status: DC | PRN
  Administered 2016-05-18 – 2016-05-21 (×6): 2 via ORAL
  Filled 2016-05-18 (×8): qty 2

## 2016-05-18 MED ORDER — ARFORMOTEROL TARTRATE 15 MCG/2ML IN NEBU
15.0000 ug | INHALATION_SOLUTION | Freq: Two times a day (BID) | RESPIRATORY_TRACT | Status: DC
  Administered 2016-05-18: 15 ug via RESPIRATORY_TRACT
  Filled 2016-05-18: qty 2

## 2016-05-18 MED ORDER — INSULIN ASPART 100 UNIT/ML ~~LOC~~ SOLN
0.0000 [IU] | Freq: Four times a day (QID) | SUBCUTANEOUS | Status: DC
  Administered 2016-05-18: 2 [IU] via SUBCUTANEOUS
  Administered 2016-05-18 (×2): 1 [IU] via SUBCUTANEOUS
  Administered 2016-05-18 – 2016-05-19 (×2): 2 [IU] via SUBCUTANEOUS
  Administered 2016-05-19 – 2016-05-20 (×3): 1 [IU] via SUBCUTANEOUS
  Administered 2016-05-20: 2 [IU] via SUBCUTANEOUS
  Administered 2016-05-20: 1 [IU] via SUBCUTANEOUS
  Administered 2016-05-20: 2 [IU] via SUBCUTANEOUS
  Administered 2016-05-21: 1 [IU] via SUBCUTANEOUS
  Administered 2016-05-21: 2 [IU] via SUBCUTANEOUS
  Administered 2016-05-21: 1 [IU] via SUBCUTANEOUS
  Administered 2016-05-21: 2 [IU] via SUBCUTANEOUS
  Administered 2016-05-22 (×2): 1 [IU] via SUBCUTANEOUS
  Administered 2016-05-22: 2 [IU] via SUBCUTANEOUS
  Administered 2016-05-22 (×2): 1 [IU] via SUBCUTANEOUS
  Administered 2016-05-23: 3 [IU] via SUBCUTANEOUS
  Administered 2016-05-24: 1 [IU] via SUBCUTANEOUS

## 2016-05-18 MED ORDER — PROCHLORPERAZINE EDISYLATE 5 MG/ML IJ SOLN
10.0000 mg | Freq: Four times a day (QID) | INTRAMUSCULAR | Status: DC | PRN
  Administered 2016-05-18: 10 mg via INTRAVENOUS
  Filled 2016-05-18: qty 2

## 2016-05-18 MED ORDER — METHYLPREDNISOLONE SODIUM SUCC 40 MG IJ SOLR
40.0000 mg | Freq: Four times a day (QID) | INTRAMUSCULAR | Status: DC
  Administered 2016-05-18 – 2016-05-21 (×12): 40 mg via INTRAVENOUS
  Filled 2016-05-18 (×12): qty 1

## 2016-05-18 MED ORDER — INFLUENZA VAC SPLIT QUAD 0.5 ML IM SUSY
0.5000 mL | PREFILLED_SYRINGE | INTRAMUSCULAR | Status: AC
  Administered 2016-05-19: 0.5 mL via INTRAMUSCULAR
  Filled 2016-05-18: qty 0.5

## 2016-05-18 MED ORDER — SODIUM CHLORIDE 0.9 % IV SOLN
INTRAVENOUS | Status: AC
  Administered 2016-05-18: 03:00:00 via INTRAVENOUS

## 2016-05-18 MED ORDER — FAMOTIDINE 20 MG PO TABS: 10.0000 mg | ORAL_TABLET | Freq: Every day | ORAL | Status: DC

## 2016-05-18 MED ORDER — ENOXAPARIN SODIUM 40 MG/0.4ML ~~LOC~~ SOLN
40.0000 mg | SUBCUTANEOUS | Status: DC
  Administered 2016-05-18 – 2016-05-24 (×7): 40 mg via SUBCUTANEOUS
  Filled 2016-05-18 (×7): qty 0.4

## 2016-05-18 MED ORDER — ACETAMINOPHEN 650 MG RE SUPP: 650.0000 mg | Freq: Four times a day (QID) | RECTAL | Status: DC | PRN

## 2016-05-18 MED ORDER — LEVALBUTEROL HCL 0.63 MG/3ML IN NEBU
0.6300 mg | INHALATION_SOLUTION | Freq: Four times a day (QID) | RESPIRATORY_TRACT | Status: DC
  Administered 2016-05-18: 0.63 mg via RESPIRATORY_TRACT
  Filled 2016-05-18: qty 3

## 2016-05-18 MED ORDER — IPRATROPIUM BROMIDE 0.02 % IN SOLN: 0.5000 mg | RESPIRATORY_TRACT | Status: DC | PRN

## 2016-05-18 MED ORDER — DEXTROSE 5 % IV SOLN
1.0000 g | Freq: Once | INTRAVENOUS | Status: AC
  Administered 2016-05-18: 1 g via INTRAVENOUS
  Filled 2016-05-18: qty 1

## 2016-05-18 MED ORDER — GUAIFENESIN ER 600 MG PO TB12
1200.0000 mg | ORAL_TABLET | Freq: Two times a day (BID) | ORAL | Status: DC
  Administered 2016-05-18 – 2016-05-24 (×12): 1200 mg via ORAL
  Filled 2016-05-18 (×12): qty 2

## 2016-05-18 MED ORDER — SODIUM CHLORIDE 0.9% FLUSH
3.0000 mL | Freq: Two times a day (BID) | INTRAVENOUS | Status: DC
  Administered 2016-05-19 – 2016-05-24 (×11): 3 mL via INTRAVENOUS

## 2016-05-18 MED ORDER — CHLORHEXIDINE GLUCONATE 0.12 % MT SOLN
15.0000 mL | Freq: Two times a day (BID) | OROMUCOSAL | Status: DC
  Administered 2016-05-18 – 2016-05-24 (×12): 15 mL via OROMUCOSAL
  Filled 2016-05-18 (×12): qty 15

## 2016-05-18 MED ORDER — UMECLIDINIUM-VILANTEROL 62.5-25 MCG/INH IN AEPB
1.0000 | INHALATION_SPRAY | Freq: Every day | RESPIRATORY_TRACT | Status: DC
  Filled 2016-05-18: qty 14

## 2016-05-18 MED ORDER — FLUTICASONE PROPIONATE 50 MCG/ACT NA SUSP
2.0000 | Freq: Every day | NASAL | Status: DC
  Administered 2016-05-19 – 2016-05-24 (×6): 2 via NASAL
  Filled 2016-05-18: qty 16

## 2016-05-18 MED ORDER — METHYLPREDNISOLONE SODIUM SUCC 125 MG IJ SOLR
125.0000 mg | Freq: Four times a day (QID) | INTRAMUSCULAR | Status: DC
  Administered 2016-05-18: 125 mg via INTRAVENOUS
  Filled 2016-05-18: qty 2

## 2016-05-18 MED ORDER — ONDANSETRON HCL 4 MG PO TABS: 4.0000 mg | ORAL_TABLET | Freq: Four times a day (QID) | ORAL | Status: DC | PRN

## 2016-05-18 MED ORDER — IPRATROPIUM-ALBUTEROL 0.5-2.5 (3) MG/3ML IN SOLN
3.0000 mL | Freq: Four times a day (QID) | RESPIRATORY_TRACT | Status: DC
  Administered 2016-05-18 – 2016-05-22 (×18): 3 mL via RESPIRATORY_TRACT
  Filled 2016-05-18 (×18): qty 3

## 2016-05-18 MED ORDER — METHYLPREDNISOLONE SODIUM SUCC 125 MG IJ SOLR
60.0000 mg | Freq: Two times a day (BID) | INTRAMUSCULAR | Status: DC
  Administered 2016-05-18: 60 mg via INTRAVENOUS
  Filled 2016-05-18: qty 2

## 2016-05-18 MED ORDER — TRAMADOL HCL 50 MG PO TABS
50.0000 mg | ORAL_TABLET | Freq: Two times a day (BID) | ORAL | Status: DC | PRN
  Administered 2016-05-19 – 2016-05-24 (×5): 50 mg via ORAL
  Filled 2016-05-18 (×5): qty 1

## 2016-05-18 MED ORDER — DOXYCYCLINE HYCLATE 100 MG IV SOLR
100.0000 mg | Freq: Two times a day (BID) | INTRAVENOUS | Status: DC
  Administered 2016-05-18: 100 mg via INTRAVENOUS
  Filled 2016-05-18 (×2): qty 100

## 2016-05-18 MED ORDER — KETOROLAC TROMETHAMINE 30 MG/ML IJ SOLN
30.0000 mg | Freq: Four times a day (QID) | INTRAMUSCULAR | Status: AC | PRN
  Administered 2016-05-18 – 2016-05-23 (×8): 30 mg via INTRAVENOUS
  Filled 2016-05-18 (×8): qty 1

## 2016-05-18 MED ORDER — SODIUM CHLORIDE 0.9 % IV SOLN
INTRAVENOUS | Status: AC
  Administered 2016-05-19: 02:00:00 via INTRAVENOUS

## 2016-05-18 MED ORDER — IPRATROPIUM-ALBUTEROL 0.5-2.5 (3) MG/3ML IN SOLN
3.0000 mL | RESPIRATORY_TRACT | Status: DC
  Administered 2016-05-18: 3 mL via RESPIRATORY_TRACT
  Filled 2016-05-18: qty 3

## 2016-05-18 MED ORDER — DIAZEPAM 2 MG PO TABS
2.0000 mg | ORAL_TABLET | Freq: Four times a day (QID) | ORAL | Status: DC | PRN
  Administered 2016-05-18 – 2016-05-24 (×9): 2 mg via ORAL
  Filled 2016-05-18 (×10): qty 1

## 2016-05-18 MED ORDER — ONDANSETRON HCL 4 MG/2ML IJ SOLN: 4.0000 mg | Freq: Four times a day (QID) | INTRAMUSCULAR | Status: DC | PRN

## 2016-05-18 MED ORDER — LEVALBUTEROL HCL 0.63 MG/3ML IN NEBU: 0.6300 mg | INHALATION_SOLUTION | RESPIRATORY_TRACT | Status: DC | PRN

## 2016-05-18 MED ORDER — ALBUTEROL SULFATE (2.5 MG/3ML) 0.083% IN NEBU: 2.5000 mg | INHALATION_SOLUTION | RESPIRATORY_TRACT | Status: DC | PRN

## 2016-05-18 MED ORDER — FAMOTIDINE IN NACL 20-0.9 MG/50ML-% IV SOLN
20.0000 mg | INTRAVENOUS | Status: DC
  Administered 2016-05-18 – 2016-05-20 (×3): 20 mg via INTRAVENOUS
  Filled 2016-05-18 (×3): qty 50

## 2016-05-18 NOTE — Progress Notes (Signed)
PROGRESS NOTE    Heather Banks  BJS:[id number] DOB: 01/08/1951 DOA: 05/17/2016 PCP: [name], NP    Brief Narrative:  65 year old female history of chronic respiratory failure secondary to COPD and stage IV lung cancer on 3 L nasal cannula at home presented with acute on chronic respiratory failure with hypoxia and hypercarbia secondary to COPD exacerbation.   Assessment & Plan:   Principal Problem:   Acute on chronic respiratory failure (HCC) Active Problems:   COPD with acute exacerbation (HCC)   Primary cancer of left upper lobe of lung (HCC)   Respiratory failure (HCC)   History of pulmonary embolus (PE)   Hyperglycemia   Abnormal TSH  #1 acute on chronic respiratory failure secondary to acute COPD exacerbation Patient presented with acute on chronic respiratory failure usually on 3 L nasal cannula. ABG done on admission with a pH of 7.2 PCO2 of 62 PO2 of 135 bicarbonate of 24. Patient on the BiPAP. Patient was tried off the BiPAP this morning on nasal cannula however went into significant respiratory distress with use of accessory muscles of respiration and subsequently placed back on the BiPAP. Repeat chest x-ray pending. Change DuoNeb's to Xopenex and Atrovent nebs scheduled as well as Xopenex and Atrovent nebs 2 hours when necessary. Discontinue IV doxycycline and placed on IV Levaquin. Add Claritin, Flonase, Pulmicort, Brovana. Change IV Solu-Medrol to 125 mg IV every 6 hours. Continue Pepcid. Consult with critical care for further evaluation and management. Remain on the BiPAP for now.  #2 hyperglycemia Likely secondary to steroids. Check a hemoglobin A1c. Sliding scale insulin.  #3 abnormal TSH Check a free T4, check a free T3, check a total T3.  #4 history of stage IV non-small cell lung cancer, adenocarcinoma Patient status post systemic chemotherapy, treatment according to BMS checkmate 370 clinical trail which was discontinued secondary to disease progression,  palliative irradiation of the left upper lobe lung mass currently under observation. Will likely need to follow-up with outpatient setting with oncology.  #5 GERD Pepcid   #6 prognosis Patient with multiple admissions recently and history of stage IV lung cancer patient likely poor prognosis. Palliative Care consultation for goals of care pending.    DVT prophylaxis: Lovenox Code Status: Partial code Family Communication: Updated patient. No family at bedside. Disposition Plan: Home when medically stable.   Consultants:   PCCM pending  Procedures:   Chest x-ray 05/18/2016, 05/17/2016  Antimicrobials:   IV doxycycline 05/18/2016 1  IV cefepime 10/22/20171  IV Levaquin 05/18/2016   Subjective: Patient states no significant improvement with shortness of breath. Patient sitting upright in bed on BiPAP in some respiratory distress. No chest pain. The respiratory therapists patient was tried on a nasal cannula however immediately started coughing and went into respiratory distress and subsequently placed back on the BiPAP.  Objective: Vitals:   05/18/16 0323 05/18/16 0600 05/18/16 0818 05/18/16 0820  BP:  (!) 147/93  (!) 119/58  Pulse:    (!) 105  Resp:  (!) 22  (!) 27  Temp:      TempSrc:      SpO2: 95% 94% 95% 95%  Height:        Intake/Output Summary (Last 24 hours) at 05/18/16 0903 Last data filed at 05/18/16 0600  Gross per 24 hour  Intake           420.83 ml  Output              500 ml  Net           -  79.17 ml   There were no vitals filed for this visit.  Examination:  General exam: Sitting upright in bed. In respiratory distress. BiPAP on. Use of accessory muscles of respiration.  Respiratory system: Coarse diffuse breath sounds anterior lung fields. Diffuse wheezing. Use of accessory muscles of respiration. In some respiratory distress. On BiPAP.  Cardiovascular system: Tachycardia. No JVD, murmurs, rubs, gallops or clicks. No pedal  edema. Gastrointestinal system: Abdomen is nondistended, soft and nontender. No organomegaly or masses felt. Normal bowel sounds heard. Central nervous system: Alert and oriented. No focal neurological deficits. Extremities: Symmetric 5 x 5 power. Skin: No rashes, lesions or ulcers Psychiatry: Judgement and insight appear normal. Mood & affect appropriate.     Data Reviewed: I have personally reviewed following labs and imaging studies  CBC:  Recent Labs Lab 05/17/16 2235 05/18/16 0338  WBC 12.6* 9.6  NEUTROABS 10.9*  --   HGB 13.8 13.1  HCT 42.4 42.4  MCV 96.1 98.6  PLT 359 938   Basic Metabolic Panel:  Recent Labs Lab 05/17/16 2235 05/18/16 0338  NA 141 140  K 4.1 4.0  CL 108 106  CO2 23 22  GLUCOSE 190* 233*  BUN 14 15  CREATININE 0.57 0.60  CALCIUM 8.9 8.9  MG  --  2.4  PHOS  --  2.2*   GFR: Estimated Creatinine Clearance: 79.3 mL/min (by C-G formula based on SCr of 0.6 mg/dL). Liver Function Tests:  Recent Labs Lab 05/18/16 0338  AST 19  ALT 12*  ALKPHOS 97  BILITOT 0.5  PROT 7.4  ALBUMIN 3.9   No results for input(s): LIPASE, AMYLASE in the last 168 hours. No results for input(s): AMMONIA in the last 168 hours. Coagulation Profile: No results for input(s): INR, PROTIME in the last 168 hours. Cardiac Enzymes: No results for input(s): CKTOTAL, CKMB, CKMBINDEX, TROPONINI in the last 168 hours. BNP (last 3 results) No results for input(s): PROBNP in the last 8760 hours. HbA1C: No results for input(s): HGBA1C in the last 72 hours. CBG:  Recent Labs Lab 05/18/16 0811  GLUCAP 170*   Lipid Profile: No results for input(s): CHOL, HDL, LDLCALC, TRIG, CHOLHDL, LDLDIRECT in the last 72 hours. Thyroid Function Tests:  Recent Labs  05/18/16 0338  TSH 0.236*   Anemia Panel: No results for input(s): VITAMINB12, FOLATE, FERRITIN, TIBC, IRON, RETICCTPCT in the last 72 hours. Sepsis Labs: No results for input(s): PROCALCITON, LATICACIDVEN in the  last 168 hours.  Recent Results (from the past 240 hour(s))  MRSA PCR Screening     Status: None   Collection Time: 05/18/16  3:32 AM  Result Value Ref Range Status   MRSA by PCR NEGATIVE NEGATIVE Final    Comment:        The GeneXpert MRSA Assay (FDA approved for NASAL specimens only), is one component of a comprehensive MRSA colonization surveillance program. It is not intended to diagnose MRSA infection nor to guide or monitor treatment for MRSA infections.          Radiology Studies: Dg Chest Port 1 View  Result Date: 05/17/2016 CLINICAL DATA:  Dyspnea after breathing treatment. History of adenocarcinoma of the left lung, COPD and pneumonia. EXAM: PORTABLE CHEST 1 VIEW COMPARISON:  04/10/2016 chest CT, 04/01/2016 chest radiograph FINDINGS: Cardiac silhouette is normal for size. Porta catheter tip is seen in the distal SVC. In the aortic atherosclerosis without aneurysm. Mild vascular congestion noted. Masslike opacity at the left lung base consistent with known left upper lobe neoplasm.  This appears slightly more prominent than on prior 04/01/2016 exam though margins are difficult to ascertain given overlap from adjacent ribs. Minimal scarring at the right upper lobe. IMPRESSION: Subjectively more prominent left upper lobe mass. Mild pulmonary vascular congestion. No acute pneumonic consolidation. Electronically Signed   By: Ashley Royalty M.D.   On: 05/17/2016 23:13        Scheduled Meds: . albuterol      . arformoterol  15 mcg Nebulization BID  . budesonide (PULMICORT) nebulizer solution  0.25 mg Nebulization BID  . doxycycline (VIBRAMYCIN) IV  100 mg Intravenous Q12H  . enoxaparin (LOVENOX) injection  40 mg Subcutaneous Q24H  . famotidine  10 mg Oral Daily  . fluticasone  2 spray Each Nare Daily  . guaiFENesin  1,200 mg Oral BID  . insulin aspart  0-9 Units Subcutaneous Q6H  . ipratropium  0.5 mg Nebulization Q6H  . levalbuterol  0.63 mg Nebulization Q6H  .  loratadine  10 mg Oral Daily  . methylPREDNISolone (SOLU-MEDROL) injection  125 mg Intravenous Q6H  . sodium chloride flush  3 mL Intravenous Q12H   Continuous Infusions: . sodium chloride 50 mL/hr at 05/18/16 0235     LOS: 0 days    Time spent: 50 minutes    THOMPSON,DANIEL, MD [facility] Pager [phone]  If 7PM-7AM, please contact night-coverage [email or web address] Password [email or web address] 05/18/2016, 9:03 AM

## 2016-05-18 NOTE — H&P (Signed)
Heather Banks [id number]:[id number] DOB: August 24, 1950 DOA: 05/17/2016     PCP: [name], NP   Outpatient Specialists: Oncology Julien Nordmann , Pulmonology [facility] Patient coming from:    home Lives alone,       Chief Complaint: dyspnea  HPI: Heather Banks is a 65 y.o. female with medical history significant of COPD on 3 L at baseline and adenocarcinoma of the lungs.    Presented with shortness of breath associated with cough and wheezing at past 2 days worse this afternoon she called EMS and was administered albuterol Atrovent and Solu-Medrol 125 mg. She was given 2 g of magnesium IV continued to be in respiratory distress bilateral wheezes requiring 4 L of oxygen patient denies any chest pain no fevers cough has been nonproductive. No associated nausea vomiting or diarrhea she attempted to use home nebulizers. Patient was states she does not wish to be intubated but wants to be recesses dictated   Regarding pertinent Chronic problems: Examination has been admitted for healthcare associated pneumonia and COPD exacerbation requiring BiPAP support in September 2017 treated with IV vancomycin and Zosyn. Patient has history of pulmonary embolism was on Xarelto's was discontinued in September She has history of stage IV adenocarcinoma lung diagnosed in 2015 status post chemotherapy with  disease progression complete immunotherapy and palliative radiotherapy April 2017 followed by Dr. Earlie Server and Dr.   Patient has history of T3-T4 compression fractures with pain status post IR performed vertebroplasty on 05/08/2016 by Dr. Estanislado Pandy  IN ER:  No data recorded.  Respirations 21 satting 95% on 8 L heart rate 119 blood pressure 163/117 Na 141 K 4.1Cr 0.57 WBC 12.6 Trop 0.00 ABG 7.226/62.2/135 Patient was placed on BiPAP Chest x-ray showing increased left upper lobe mass mild pulmonary congestion no consolidation Following Medications were ordered in ER: Medications  albuterol (PROVENTIL,VENTOLIN)  solution continuous neb (10 mg/hr Nebulization New Bag/Given 05/17/16 2206)  albuterol (PROVENTIL, VENTOLIN) (5 MG/ML) 0.5% continuous inhalation solution (  Not Given 05/17/16 2301)  magnesium sulfate IVPB 2 g 50 mL (2 g Intravenous New Bag/Given 05/18/16 0000)  albuterol (PROVENTIL) (2.5 MG/3ML) 0.083% nebulizer solution 5 mg (5 mg Nebulization Given 05/17/16 2146)  albuterol (PROVENTIL) (2.5 MG/3ML) 0.083% nebulizer solution (  Return to [facility] 05/17/16 2151)      Hospitalist was called for admission for COPD exacerbation and acute respiratory stress with hypercarbia and hypoxia  Review of Systems:    Pertinent positives include:  dyspnea on exertion,  excess mucus,  productive cough,  wheezing. shortness of breath at rest Constitutional:  No weight loss, night sweats, Fevers, chills, fatigue, weight loss  HEENT:  No headaches, Difficulty swallowing,Tooth/dental problems,Sore throat,  No sneezing, itching, ear ache, nasal congestion, post nasal drip,  Cardio-vascular:  No chest pain, Orthopnea, PND, anasarca, dizziness, palpitations.no Bilateral lower extremity swelling  GI:  No heartburn, indigestion, abdominal pain, nausea, vomiting, diarrhea, change in bowel habits, loss of appetite, melena, blood in stool, hematemesis Resp:    NoNo non-productive cough, No coughing up of blood.No change in color of mucus.No Skin:  no rash or lesions. No jaundice GU:  no dysuria, change in color of urine, no urgency or frequency. No straining to urinate.  No flank pain.  Musculoskeletal:  No joint pain or no joint swelling. No decreased range of motion. No back pain.  Psych:  No change in mood or affect. No depression or anxiety. No memory loss.  Neuro: no localizing neurological complaints, no tingling, no weakness, no double vision, no  gait abnormality, no slurred speech, no confusion  As per HPI otherwise 10 point review of systems negative.   Past Medical History: Past Medical  History:  Diagnosis Date  . Adenocarcinoma of left lung, stage 4 ([place]) 07/09/2014  . Compression fracture of thoracic vertebra (HCC) 04/21/2016  . COPD (chronic obstructive pulmonary disease) (HCC)    Albuterol neb as needed;Pulmicort neb daily  . Encounter for antineoplastic chemotherapy 01/21/2015  . Full code status 05/01/2015  . GERD (gastroesophageal reflux disease)    takes Pantoprazole and Zantac daily  . History of bronchitis    >5 yrs ago  . History of migraine    last one 2 wks ago  . Hx of radiation therapy 10/29/2015 to 11/16/2015   The Left upper lobe was treated to 37.5 Gy in 15 fractions at 2.5 Gy per fraction  . Lung mass   . Panic attacks    but doesn't take any meds  . Pneumonia    hx of-last time about 4+yrs ago   Past Surgical History:  Procedure Laterality Date  . ABDOMINAL HYSTERECTOMY    . APPENDECTOMY    . IR GENERIC HISTORICAL  05/02/2016   IR RADIOLOGIST EVAL & MGMT 05/02/2016 [facility]  . IR GENERIC HISTORICAL  05/08/2016   IR VERTEBROPLASTY EA ADDL (T&LS) BX INC UNI/BIL INC INJECT/IMAGING 05/08/2016 Luanne Bras, MD [facility]  . IR GENERIC HISTORICAL  05/08/2016   IR VERTEBROPLASTY CERV/THOR BX INC UNI/BIL INC/INJECT/IMAGING 05/08/2016 Luanne Bras, MD [facility]  . OOPHORECTOMY    . PORTACATH PLACEMENT  jan. 2016  . TONSILLECTOMY    . VIDEO BRONCHOSCOPY WITH ENDOBRONCHIAL NAVIGATION N/A 08/14/2014   Procedure: VIDEO BRONCHOSCOPY WITH ENDOBRONCHIAL NAVIGATION;  Surgeon: Rigoberto Noel, MD;  Location: [facility];  Service: Thoracic;  Laterality: N/A;     Social History:  Ambulatory   walker      reports that she quit smoking about 2 months ago. Her smoking use included Cigarettes. She has a 11.00 pack-year smoking history. She has never used smokeless tobacco. She reports that she does not drink alcohol or use drugs.  Allergies:   Allergies  Allergen Reactions  . Ativan [Lorazepam] Other (See Comments)    Reaction:  Makes pt angry  - opposite reaction of med should do  . Cinnamon Other (See Comments)    Seasonal scent trigger migraine as well as pt's overall immune system       Family History:   Family History  Problem Relation Age of Onset  . COPD Father     Medications: Prior to Admission medications   Medication Sig Start Date End Date Taking? Authorizing Provider  acidophilus (RISAQUAD) CAPS capsule Take 1 capsule by mouth daily.    Historical Provider, MD  albuterol (PROVENTIL HFA;VENTOLIN HFA) 108 (90 Base) MCG/ACT inhaler Inhale 2 puffs into the lungs every 6 (six) hours as needed for wheezing or shortness of breath. 04/05/16   Geradine Girt, DO  albuterol (PROVENTIL) (2.5 MG/3ML) 0.083% nebulizer solution Take 3 mLs (2.5 mg total) by nebulization every 6 (six) hours as needed for wheezing or shortness of breath. 06/13/15   Rigoberto Noel, MD  ALPRAZolam Duanne Moron) 0.5 MG tablet Take 0.5 mg by mouth at bedtime as needed for sleep or anxiety. 03/20/15   Historical Provider, MD  aspirin-acetaminophen-caffeine (EXCEDRIN MIGRAINE) [id number] MG per tablet Take 2 tablets by mouth every 6 (six) hours as needed for headache.    Historical Provider, MD  diazepam (VALIUM) 2 MG tablet  Take 1 tablet (2 mg total) by mouth every 6 (six) hours as needed for anxiety. 04/14/16   Rigoberto Noel, MD  diphenhydrAMINE (BENADRYL) 25 mg capsule Take 25 mg by mouth every 6 (six) hours as needed for itching. Reported on 01/19/2016    Historical Provider, MD  hydrocortisone 1 % ointment Apply 1 application topically 2 (two) times daily. Patient taking differently: Apply 1 application topically 2 (two) times daily as needed.  01/08/16   Curt Bears, MD  ipratropium-albuterol (DUONEB) 0.5-2.5 (3) MG/3ML SOLN Take 3 mLs by nebulization 3 (three) times daily. Patient taking differently: Take 3 mLs by nebulization every 6 (six) hours as needed.  03/04/16   Theodis Blaze, MD  Lidocaine (ASPERCREME LIDOCAINE) 4 % PTCH Apply 1 patch topically daily  as needed. Place on back as needed for pain    Historical Provider, MD  lidocaine-prilocaine (EMLA) cream Apply 1 application topically as needed (prior to accessing port).    Historical Provider, MD  mirtazapine (REMERON) 30 MG tablet Take 15 mg by mouth at bedtime as needed (for sleep).     Historical Provider, MD  ranitidine (ZANTAC) 150 MG tablet Take 150 mg by mouth 2 (two) times daily.     Historical Provider, MD  traMADol (ULTRAM) 50 MG tablet Take 1 tablet (50 mg total) by mouth every 12 (twelve) hours as needed for moderate pain. 04/14/16   Rigoberto Noel, MD  umeclidinium-vilanterol (ANORO ELLIPTA) 62.5-25 MCG/INH AEPB Inhale 1 puff into the lungs daily. 04/14/16   Rigoberto Noel, MD    Physical Exam: Patient Vitals for the past 24 hrs:  BP Temp src Pulse Resp SpO2  05/17/16 2315 - - 119 21 95 %  05/17/16 2210 - - - - 93 %  05/17/16 2151 - - - - 91 %  05/17/16 2116 (!) 163/117 Oral (!) 125 (!) 28 95 %    1. General:  in No Acute distress 2. Psychological: somnolent but Arousable to verbal stimulation Oriented to self and situation 3. Head/ENT:     Dry Mucous Membranes                          Head Non traumatic, neck supple                            Poor Dentition 4. SKIN:  decreased Skin turgor,  Skin clean Dry and intact no rash 5. Heart: Regular rate and rhythm no  Murmur, Rub or gallop 6. Lungs:  some wheezes no crackles  On Bipap 7. Abdomen: Soft,  non-tender, Non distended 8. Lower extremities: no clubbing, cyanosis, or edema 9. Neurologically Grossly intact, moving all 4 extremities equally   10. MSK: Normal range of motion   body mass index is unknown because there is no height or weight on file.  Labs on Admission:   Labs on Admission: I have personally reviewed following labs and imaging studies  CBC:  Recent Labs Lab 05/17/16 2235  WBC 12.6*  NEUTROABS 10.9*  HGB 13.8  HCT 42.4  MCV 96.1  PLT 884   Basic Metabolic Panel:  Recent Labs Lab  05/17/16 2235  NA 141  K 4.1  CL 108  CO2 23  GLUCOSE 190*  BUN 14  CREATININE 0.57  CALCIUM 8.9   GFR: Estimated Creatinine Clearance: 79.3 mL/min (by C-G formula based on SCr of 0.57 mg/dL). Liver Function Tests:  No results for input(s): AST, ALT, ALKPHOS, BILITOT, PROT, ALBUMIN in the last 168 hours. No results for input(s): LIPASE, AMYLASE in the last 168 hours. No results for input(s): AMMONIA in the last 168 hours. Coagulation Profile: No results for input(s): INR, PROTIME in the last 168 hours. Cardiac Enzymes: No results for input(s): CKTOTAL, CKMB, CKMBINDEX, TROPONINI in the last 168 hours. BNP (last 3 results) No results for input(s): PROBNP in the last 8760 hours. HbA1C: No results for input(s): HGBA1C in the last 72 hours. CBG: No results for input(s): GLUCAP in the last 168 hours. Lipid Profile: No results for input(s): CHOL, HDL, LDLCALC, TRIG, CHOLHDL, LDLDIRECT in the last 72 hours. Thyroid Function Tests: No results for input(s): TSH, T4TOTAL, FREET4, T3FREE, THYROIDAB in the last 72 hours. Anemia Panel: No results for input(s): VITAMINB12, FOLATE, FERRITIN, TIBC, IRON, RETICCTPCT in the last 72 hours.  Sepsis Labs: '@LABRCNTIP'$ (procalcitonin:4,lacticidven:4) )No results found for this or any previous visit (from the past 240 hour(s)).   UA  not ordered  No results found for: HGBA1C  Estimated Creatinine Clearance: 79.3 mL/min (by C-G formula based on SCr of 0.57 mg/dL).  BNP (last 3 results) No results for input(s): PROBNP in the last 8760 hours.   ECG REPORT  Independently reviewed Rate: 125  Rhythm: Sinus tachycardia ST&T Change: No acute ischemic changes   QTC 455  There were no vitals filed for this visit.   Cultures:    Component Value Date/Time   SDES URINE, CLEAN CATCH 04/02/2016 0036   SPECREQUEST NONE 04/02/2016 0036   CULT NO GROWTH Performed at [facility]  04/02/2016 0036   REPTSTATUS 04/03/2016 FINAL 04/02/2016 0036      Radiological Exams on Admission: Dg Chest Port 1 View  Result Date: 05/17/2016 CLINICAL DATA:  Dyspnea after breathing treatment. History of adenocarcinoma of the left lung, COPD and pneumonia. EXAM: PORTABLE CHEST 1 VIEW COMPARISON:  04/10/2016 chest CT, 04/01/2016 chest radiograph FINDINGS: Cardiac silhouette is normal for size. Porta catheter tip is seen in the distal SVC. In the aortic atherosclerosis without aneurysm. Mild vascular congestion noted. Masslike opacity at the left lung base consistent with known left upper lobe neoplasm. This appears slightly more prominent than on prior 04/01/2016 exam though margins are difficult to ascertain given overlap from adjacent ribs. Minimal scarring at the right upper lobe. IMPRESSION: Subjectively more prominent left upper lobe mass. Mild pulmonary vascular congestion. No acute pneumonic consolidation. Electronically Signed   By: Ashley Royalty M.D.   On: 05/17/2016 23:13    Chart has been reviewed    Assessment/Plan  65 y.o. female with medical history significant of COPD on 3 L at baseline and adenocarcinoma of the lungs. Being admitted for acute on chronic respiratory failure with hypoxia and hypercarbia secondary to COPD exacerbation  Present on Admission: . COPD with acute exacerbation ([place]) -  - Will initiate Steroid taper, antibiotics, xopenex PRN, scheduled duoneb,  and Mucinex. Titrate O2 to saturation >90%. Follow patients respiratory status. Continue BiPAP admit to step down repeat ABG appreciate PCCM input  . Respiratory failure ([place]) hypoxic off carbonate respiratory failure admit to step down on BiPAP for respiratory status and repeat ABG. Patient states wishes to BE DO NOT INTUBATE. Overall given and repeated admissions and stage IV lung cancer poor prognosis would benefit from palliative care consult . Primary cancer of left upper lobe of lung ([place]) we will need to notify oncology patient has been admitted . History of pulmonary  embolus (PE) currently  no chest pain symptoms more consistent with COPD exacerbation. Would need to discuss with oncology if patient is to be on long-term anticoagulation  Other plan as per orders.  DVT prophylaxis:   Lovenox     Code Status: DO NOT INTUBATE but otherwise resuscitate as per patient     Family Communication:   Family  not at  Bedside    Disposition Plan:      To home once workup is complete and patient is stable                        Palliative care   consulted                          Consults called: PCCM  Admission status:  inpatient       Level of care        SDU      I have spent a total of 56 min on this admission  Extra time was taken to discuss case with [facility] M  [place] 05/18/2016, 1:08 AM    [facility]  Pager [phone]   after 2 AM please page floor coverage PA If 7AM-7PM, please contact the day team taking care of the patient  [email or web address]  Password [email or web address]

## 2016-05-18 NOTE — Consult Note (Signed)
Consultation Note Date: 05/18/2016   Patient Name: Heather Banks  DOB: 1950/11/25  MRN: [id number]  Age / Sex: 65 y.o., female  PCP: Micheline Chapman, NP Referring Physician: Eugenie Filler, MD  Reason for Consultation: Establishing goals of care  HPI/Patient Profile: 65 y.o. female     admitted on 05/17/2016     Clinical Assessment and Goals of Care:  65 year old lady who lives in [place] with her family. She has a mother, 2 sons and her daughter. Daughter Heather Banks who is an EMT is also her designated healthcare power of attorney agent. Patient is established with Dr. Dedra Skeens for her COPD, also sees Dr. Rogue Jury for lung cancer. Patient has had several days of cough and wheezing. She has stage IV non-small cell lung cancer status post chemotherapy and radiation. She is on 3 L of oxygen at home. Workup in the emergency department showed worsening hypercarbia. She was started on BiPAP antibiotic steroids and admitted to stepdown. She has not been able to tolerate coming off of the BiPAP machine.  Palliative consultation has been requested for goals of care discussions.  Patient is older than stated age appearing lady resting in bed. She is on the BiPAP. She is awake and alert. She is able to answer questions appropriately however is not able to speak in full sentences for to long because of respiratory distress. Call placed and subsequently daughter Heather Banks arrived at the bedside. Her numbers [phone]. Discussed extensively with patient and subsequently with her daughter Heather Banks after introducing palliative care as follows: Palliative medicine is specialized medical care for people living with serious illness. It focuses on providing relief from the symptoms and stress of a serious illness. The goal is to improve quality of life for both the patient and the family.  Heather Banks states that the  patient has had extensive goals of care discussions with various providers and has elected not to go on a breathing machine. She does not desire endotracheal intubation and mechanical ventilation. Daughter Heather Banks states that the patient is well aware that if she got intubated she might not come off of the breathing machine due to her advanced stage COPD and advanced age lung cancer. Hence, patient is only willing to be on BiPAP. However, patient has reportedly stated she would want to undergo CPR shocks if indicated and further resuscitative attempt to be tried for at least 5-10 minutes. If the patient is not able to be resuscitated after that time period, she would want comfort measures. Extensive discussions undertaken with patient and subsequently daughter Heather Banks about heart and lungs basically functioning is 1 machine, focusing more on full scope of DO NOT RESUSCITATE/DO NOT INTUBATE. Additional information given about the BiPAP being a temporary option. Discussed about what would happen if the patient was not able to tolerate coming off of BiPAP. That option would include comfort measures-discontinuation of BiPAP/compassionate liberation from BiPAP and use of opioids and benzodiazepines for comfort measures, end-of-life care. Daughter becomes tearful and states she is hopeful that the patient will  turn around a corner and be able to come off of the BiPAP in this hospitalization. Thank you for the consult, palliative will continue to follow along and help guide decision-making.  HCPOA  daughter Heather Banks at [phone] [place]    DO NOT INTUBATE but not DO NOT RESUSCITATE see discussion above Continue with BiPAP for now Detailed information given to daughter about likelihood of resorting to comfort measures if the patient is not able to tolerate coming off of the BiPAP Palliative will continue to follow along. Code Status/Advance Care Planning:  Limited code    Symptom  Management:    continue current treatment for now, see above   Palliative Prophylaxis:   Delirium Protocol  Psycho-social/Spiritual:   Desire for further Chaplaincy support:no  Additional Recommendations: Education on Hospice  Prognosis:   Unable to determine  Discharge Planning: To Be Determined      Primary Diagnoses: Present on Admission: . Respiratory failure ([place]) . Primary cancer of left upper lobe of lung ([place]) . History of pulmonary embolus ([facility]) . COPD with acute exacerbation ([place]) . Acute on chronic respiratory failure ([place]) . Abnormal TSH   I have reviewed the medical record, interviewed the patient and family, and examined the patient. The following aspects are pertinent.  Past Medical History:  Diagnosis Date  . Adenocarcinoma of left lung, stage 4 ([place]) 07/09/2014  . Compression fracture of thoracic vertebra ([facility]) 04/21/2016  . COPD (chronic obstructive pulmonary disease) ([facility])    Albuterol neb as needed;Pulmicort neb daily  . Encounter for antineoplastic chemotherapy 01/21/2015  . Full code status 05/01/2015  . GERD (gastroesophageal reflux disease)    takes Pantoprazole and Zantac daily  . History of bronchitis    >5 yrs ago  . History of migraine    last one 2 wks ago  . Hx of radiation therapy 10/29/2015 to 11/16/2015   The Left upper lobe was treated to 37.5 Gy in 15 fractions at 2.5 Gy per fraction  . Lung mass   . Panic attacks    but doesn't take any meds  . Pneumonia    hx of-last time about 4+yrs ago   Social History   Social History  . Marital status: Widowed    Spouse name: N/A  . Number of children: N/A  . Years of education: N/A   Social History Main Topics  . Smoking status: Former Smoker    Packs/day: 0.25    Years: 44.00    Types: Cigarettes    Quit date: 03/14/2016  . Smokeless tobacco: Never Used  . Alcohol use No     Comment: no alcohol in 7 yrs   . Drug use: No  . Sexual activity: Not Asked   Other Topics Concern    . None   Social History Narrative  . None   Family History  Problem Relation Age of Onset  . COPD Father    Scheduled Meds: . chlorhexidine  15 mL Mouth Rinse BID  . enoxaparin (LOVENOX) injection  40 mg Subcutaneous Q24H  . famotidine (PEPCID) IV  20 mg Intravenous Q24H  . fluticasone  2 spray Each Nare Daily  . guaiFENesin  1,200 mg Oral BID  . [START ON 05/19/2016] Influenza vac split quadrivalent PF  0.5 mL Intramuscular Tomorrow-1000  . insulin aspart  0-9 Units Subcutaneous Q6H  . ipratropium-albuterol  3 mL Nebulization Q6H  . levofloxacin (LEVAQUIN) IV  750 mg Intravenous Q24H  . loratadine  10 mg  Oral Daily  . mouth rinse  15 mL Mouth Rinse q12n4p  . methylPREDNISolone (SOLU-MEDROL) injection  40 mg Intravenous Q6H  . sodium chloride flush  3 mL Intravenous Q12H   Continuous Infusions:  PRN Meds:.acetaminophen **OR** acetaminophen, albuterol, aspirin-acetaminophen-caffeine, diazepam, ketorolac, morphine injection, ondansetron **OR** ondansetron (ZOFRAN) IV, prochlorperazine, traMADol Medications Prior to Admission:  Prior to Admission medications   Medication Sig Start Date End Date Taking? Authorizing Provider  acidophilus (RISAQUAD) CAPS capsule Take 1 capsule by mouth daily.   Yes Historical Provider, MD  albuterol (PROVENTIL HFA;VENTOLIN HFA) 108 (90 Base) MCG/ACT inhaler Inhale 2 puffs into the lungs every 6 (six) hours as needed for wheezing or shortness of breath. 04/05/16  Yes Jessica U Vann, DO  albuterol (PROVENTIL) (2.5 MG/3ML) 0.083% nebulizer solution Take 3 mLs (2.5 mg total) by nebulization every 6 (six) hours as needed for wheezing or shortness of breath. 06/13/15  Yes Rigoberto Noel, MD  ALPRAZolam Duanne Moron) 0.5 MG tablet Take 0.5 mg by mouth at bedtime as needed for sleep or anxiety. 03/20/15  Yes Historical Provider, MD  aspirin-acetaminophen-caffeine (EXCEDRIN MIGRAINE) [id number] MG per tablet Take 2 tablets by mouth every 6 (six) hours as needed for  headache.   Yes Historical Provider, MD  calcium carbonate (OS-CAL - DOSED IN MG OF ELEMENTAL CALCIUM) 1250 (500 Ca) MG tablet Take 1 tablet by mouth daily with breakfast.   Yes Historical Provider, MD  cyanocobalamin 2000 MCG tablet Take 2,000 mcg by mouth daily.   Yes Historical Provider, MD  diazepam (VALIUM) 2 MG tablet Take 1 tablet (2 mg total) by mouth every 6 (six) hours as needed for anxiety. 04/14/16  Yes Rigoberto Noel, MD  diphenhydrAMINE (BENADRYL) 25 mg capsule Take 25 mg by mouth every 6 (six) hours as needed for itching. Reported on 01/19/2016   Yes Historical Provider, MD  hydrocortisone 1 % ointment Apply 1 application topically 2 (two) times daily. Patient taking differently: Apply 1 application topically 2 (two) times daily as needed.  01/08/16  Yes Curt Bears, MD  Lidocaine (ASPERCREME LIDOCAINE) 4 % PTCH Apply 1 patch topically daily as needed. Place on back as needed for pain   Yes Historical Provider, MD  lidocaine-prilocaine (EMLA) cream Apply 1 application topically as needed (prior to accessing port).   Yes Historical Provider, MD  mirtazapine (REMERON) 30 MG tablet Take 15 mg by mouth at bedtime as needed (for sleep).    Yes Historical Provider, MD  traMADol (ULTRAM) 50 MG tablet Take 1 tablet (50 mg total) by mouth every 12 (twelve) hours as needed for moderate pain. 04/14/16  Yes Rigoberto Noel, MD  umeclidinium-vilanterol (ANORO ELLIPTA) 62.5-25 MCG/INH AEPB Inhale 1 puff into the lungs daily. 04/14/16  Yes Rigoberto Noel, MD   Allergies  Allergen Reactions  . Ativan [Lorazepam] Other (See Comments)    Reaction:  Makes pt angry - opposite reaction of med should do  . Cinnamon Other (See Comments)    Seasonal scent trigger migraine as well as pt's overall immune system   Review of Systems Not able to speak in full sentences, on BiPAP, dyspneic.   Physical Exam General: alert Neuro: follows commands, normal strength HEENT: pupils reactive, Bipap mask on Cardiac:  regular, tachycardic, no murmur Chest: diffuse b/l expiratory wheezing Abd: soft, non tender Ext: no edema Skin: no rashes Vital Signs: BP 129/80 (BP Location: Left Arm)   Pulse (!) 105   Temp 97.4 F (36.3 C) (Axillary)   Resp 18  Ht '5\' 5"'$  (1.651 m)   Wt 88 kg (194 lb)   SpO2 94%   BMI 32.28 kg/m  Pain Assessment: 0-10   Pain Score: 9    SpO2: SpO2: 94 % O2 Device:SpO2: 94 % O2 Flow Rate: .O2 Flow Rate (L/min): 8 L/min  IO: Intake/output summary:  Intake/Output Summary (Last 24 hours) at 05/18/16 1231 Last data filed at 05/18/16 1152  Gross per 24 hour  Intake           620.83 ml  Output              500 ml  Net           120.83 ml    LBM:   Baseline Weight: Weight: 88 kg (194 lb) Most recent weight: Weight: 88 kg (194 lb)     Palliative Assessment/Data:   Flowsheet Rows   Flowsheet Row Most Recent Value  Intake Tab  Referral Department  Hospitalist  Unit at Time of Referral  ICU  Palliative Care Primary Diagnosis  Cancer  Palliative Care Type  New Palliative care  Reason for referral  Non-pain Symptom, Clarify Goals of Care  Date first seen by Palliative Care  05/18/16  Clinical Assessment  Palliative Performance Scale Score  30%  Pain Max last 24 hours  6  Pain Min Last 24 hours  5  Dyspnea Max Last 24 Hours  8  Dyspnea Min Last 24 hours  7  Nausea Max Last 24 Hours  5  Nausea Min Last 24 Hours  4  Anxiety Max Last 24 Hours  5  Anxiety Min Last 24 Hours  4  Psychosocial & Spiritual Assessment  Palliative Care Outcomes  Patient/Family meeting held?  Yes  Who was at the meeting?  patient daughter Chapman Fitch who is an EMT  Palliative Care Outcomes  Clarified goals of care  Palliative Care follow-up planned  Yes, Facility      Time In:  10 Time Out:  11.10 Time Total:  70 min   Greater than 50%  of this time was spent counseling and coordinating care related to the above assessment and plan.  Signed by: Loistine Chance, MD  [phone]    Please contact Palliative Medicine Team phone at [phone] for questions and concerns.  For individual provider: See Shea Evans

## 2016-05-18 NOTE — Progress Notes (Signed)
PT Cancellation Note  Patient Details Name: Heather Banks MRN: [id number] DOB: 1951-05-12   Cancelled Treatment:    Reason Eval/Treat Not Completed: Medical issues which prohibited therapy (pt on Bipap. Will follow. )   Philomena Doheny 05/18/2016, 11:16 AM [phone]

## 2016-05-18 NOTE — Consult Note (Signed)
PCCM CONSULT NOTE  Admission date: 05/17/2016 Consult date: 05/18/2016 Referring provider: Dr. Grandville Silos, [facility]  CC: Short of breath  HPI: 65 yo female former smoker presented with several days of cough and wheezing from COPD exacerbation.  She has hx of GOLD D COPD on 3 liters oxygen at home and Stage IV NSCLC lung cancer.  She has been on chemotherapy with carboplatin, alimta, tecentriq, and radiation to LUL mass.  Her blood gas showed worsening hypercapnia.  She was started on Bipap, Abx, and steroids.  She has not been able to come off Bipap.  She is leaving it up to her daughter to address goals of care, and has not meet with palliative care team before.  PMHx: She  has a past medical history of Adenocarcinoma of left lung, stage 4 ([place]) (07/09/2014); Compression fracture of thoracic vertebra ([facility]) (04/21/2016); COPD (chronic obstructive pulmonary disease) ([place]); Encounter for antineoplastic chemotherapy (01/21/2015); Full code status (05/01/2015); GERD (gastroesophageal reflux disease); History of bronchitis; History of migraine; radiation therapy (10/29/2015 to 11/16/2015); Lung mass; Panic attacks; and Pneumonia.  PSHx: She  has a past surgical history that includes Abdominal hysterectomy; Oophorectomy; Appendectomy; Tonsillectomy; Video bronchoscopy with endobronchial navigation (N/A, 08/14/2014); Portacath placement (jan. 2016); ir generic historical (05/02/2016); ir generic historical (05/08/2016); and ir generic historical (05/08/2016).  FHx: Her family history includes COPD in her father.  SHx: She  reports that she quit smoking about 2 months ago. Her smoking use included Cigarettes. She has a 11.00 pack-year smoking history. She has never used smokeless tobacco. She reports that she does not drink alcohol or use drugs.   Current Facility-Administered Medications on File Prior to Encounter  Medication  . heparin lock flush 100 unit/mL  . sodium chloride 0.9 % injection 10 mL  .  sodium chloride 0.9 % injection 10 mL   Current Outpatient Prescriptions on File Prior to Encounter  Medication Sig  . acidophilus (RISAQUAD) CAPS capsule Take 1 capsule by mouth daily.  Marland Kitchen albuterol (PROVENTIL HFA;VENTOLIN HFA) 108 (90 Base) MCG/ACT inhaler Inhale 2 puffs into the lungs every 6 (six) hours as needed for wheezing or shortness of breath.  Marland Kitchen albuterol (PROVENTIL) (2.5 MG/3ML) 0.083% nebulizer solution Take 3 mLs (2.5 mg total) by nebulization every 6 (six) hours as needed for wheezing or shortness of breath.  . ALPRAZolam (XANAX) 0.5 MG tablet Take 0.5 mg by mouth at bedtime as needed for sleep or anxiety.  Marland Kitchen aspirin-acetaminophen-caffeine (EXCEDRIN MIGRAINE) 250-250-65 MG per tablet Take 2 tablets by mouth every 6 (six) hours as needed for headache.  . diazepam (VALIUM) 2 MG tablet Take 1 tablet (2 mg total) by mouth every 6 (six) hours as needed for anxiety.  . diphenhydrAMINE (BENADRYL) 25 mg capsule Take 25 mg by mouth every 6 (six) hours as needed for itching. Reported on 01/19/2016  . hydrocortisone 1 % ointment Apply 1 application topically 2 (two) times daily. (Patient taking differently: Apply 1 application topically 2 (two) times daily as needed. )  . Lidocaine (ASPERCREME LIDOCAINE) 4 % PTCH Apply 1 patch topically daily as needed. Place on back as needed for pain  . lidocaine-prilocaine (EMLA) cream Apply 1 application topically as needed (prior to accessing port).  . mirtazapine (REMERON) 30 MG tablet Take 15 mg by mouth at bedtime as needed (for sleep).   . traMADol (ULTRAM) 50 MG tablet Take 1 tablet (50 mg total) by mouth every 12 (twelve) hours as needed for moderate pain.  Marland Kitchen umeclidinium-vilanterol (ANORO ELLIPTA) 62.5-25 MCG/INH AEPB  Inhale 1 puff into the lungs daily.    Allergies  Allergen Reactions  . Ativan [Lorazepam] Other (See Comments)    Reaction:  Makes pt angry - opposite reaction of med should do  . Cinnamon Other (See Comments)    Seasonal scent  trigger migraine as well as pt's overall immune system   ROS: C/o back pain and headache.  Denies sinus congestion, sore throat, leg swelling, fever, abdominal pain, nausea, diarrhea, dysuria.   Vital signs: BP (!) 119/58   Pulse (!) 105   Temp 98.8 F (37.1 C) (Axillary)   Resp (!) 27   Ht '5\' 5"'$  (1.651 m)   Wt 194 lb (88 kg)   SpO2 93%   BMI 32.28 kg/m   Intake/output: I/O last 3 completed shifts: In: 420.8 [I.V.:170.8; IV Piggyback:250] Out: 500 [Urine:500]  General: alert Neuro: follows commands, normal strength HEENT: pupils reactive, Bipap mask on Cardiac: regular, tachycardic, no murmur Chest: diffuse b/l expiratory wheezing Abd: soft, non tender Ext: no edema Skin: no rashes   CMP Latest Ref Rng & Units 05/18/2016 05/17/2016 05/08/2016  Glucose 65 - 99 mg/dL 233(H) 190(H) 91  BUN 6 - 20 mg/dL '15 14 7  '$ Creatinine 0.44 - 1.00 mg/dL 0.60 0.57 0.55  Sodium 135 - 145 mmol/L 140 141 140  Potassium 3.5 - 5.1 mmol/L 4.0 4.1 3.5  Chloride 101 - 111 mmol/L 106 108 108  CO2 22 - 32 mmol/L '22 23 24  '$ Calcium 8.9 - 10.3 mg/dL 8.9 8.9 8.7(L)  Total Protein 6.5 - 8.1 g/dL 7.4 - -  Total Bilirubin 0.3 - 1.2 mg/dL 0.5 - -  Alkaline Phos 38 - 126 U/L 97 - -  AST 15 - 41 U/L 19 - -  ALT 14 - 54 U/L 12(L) - -    CBC Latest Ref Rng & Units 05/18/2016 05/17/2016 05/08/2016  WBC 4.0 - 10.5 K/uL 9.6 12.6(H) 5.4  Hemoglobin 12.0 - 15.0 g/dL 13.1 13.8 12.7  Hematocrit 36.0 - 46.0 % 42.4 42.4 40.2  Platelets 150 - 400 K/uL 332 359 276    ABG    Component Value Date/Time   PHART 7.275 (L) 05/18/2016 0240   PCO2ART 54.6 (H) 05/18/2016 0240   PO2ART 249 (H) 05/18/2016 0240   HCO3 24.9 05/18/2016 0240   TCO2 28.8 01/19/2016 0422   ACIDBASEDEF 2.6 (H) 05/18/2016 0240   O2SAT 99.2 05/18/2016 0240    Dg Chest Port 1 View  Result Date: 05/18/2016 CLINICAL DATA:  Respiratory failure EXAM: PORTABLE CHEST 1 VIEW COMPARISON:  05/17/2016 FINDINGS: Right chest wall port is again  seen. The changes in the apex on the left are less well visualized on this exam due to patient positioning. No other focal abnormality is seen. IMPRESSION: No new focal abnormality noted. The changes in the left apex are less well visualized on the current exam. Electronically Signed   By: Inez Catalina M.D.   On: 05/18/2016 10:06   Dg Chest Port 1 View  Result Date: 05/17/2016 CLINICAL DATA:  Dyspnea after breathing treatment. History of adenocarcinoma of the left lung, COPD and pneumonia. EXAM: PORTABLE CHEST 1 VIEW COMPARISON:  04/10/2016 chest CT, 04/01/2016 chest radiograph FINDINGS: Cardiac silhouette is normal for size. Porta catheter tip is seen in the distal SVC. In the aortic atherosclerosis without aneurysm. Mild vascular congestion noted. Masslike opacity at the left lung base consistent with known left upper lobe neoplasm. This appears slightly more prominent than on prior 04/01/2016 exam though margins are difficult  to ascertain given overlap from adjacent ribs. Minimal scarring at the right upper lobe. IMPRESSION: Subjectively more prominent left upper lobe mass. Mild pulmonary vascular congestion. No acute pneumonic consolidation. Electronically Signed   By: Ashley Royalty M.D.   On: 05/17/2016 23:13    Studies: PFT 07/10/14 >> FEV1 1.21 (46%), FEV1% 59, TLC 8.82 (169%), DLCO 56%  Cultures: Sputum 10/21 >>  Antibiotics: Levaquin 10/21 >>  Events: 10/21 Admit 10/22 Palliative care consulted  Summary: 65 yo female former smoker with cough, wheeze, dyspnea and acute on chronic hypoxic/hypercapnic respiratory failure from AECOPD.  She has hx of Stage IV NSCLC.  Assessment/plan:  Acute on chronic hypoxic/hypercapnic respiratory failure. - oxygen to keep SpO2 90 to 95% - Bipap qhs and prn  AECOPD. - scheduled BDs with duoneb - albuterol q2h prn - continue solumedrol - day 2 of levaquin - prn IV morphine for dyspnea  Hx of GERD. - pepcid  Steroid induced hyperglycemia. -  SSI  DVT prophylaxis - lovenox Goals of care - She is listed as DNI, but she doesn't seem clear about what this means.  She has deferred to her daughter to make these kind of decisions.  She is open to discuss further with palliative care team.  CC time 33 minutes.  Chesley Mires, MD [facility] Pulmonary/Critical Care 05/18/2016, 11:23 AM Pager:  [phone] After 3pm call: [phone]

## 2016-05-19 LAB — GLUCOSE, CAPILLARY
GLUCOSE-CAPILLARY: 121 mg/dL — AB (ref 65–99)
GLUCOSE-CAPILLARY: 156 mg/dL — AB (ref 65–99)
GLUCOSE-CAPILLARY: 128 mg/dL — AB (ref 65–99)
Glucose-Capillary: 128 mg/dL — ABNORMAL HIGH (ref 65–99)

## 2016-05-19 LAB — BASIC METABOLIC PANEL
ANION GAP: 9 (ref 5–15)
BUN: 16 mg/dL (ref 6–20)
CHLORIDE: 104 mmol/L (ref 101–111)
CO2: 26 mmol/L (ref 22–32)
Calcium: 9.1 mg/dL (ref 8.9–10.3)
Creatinine, Ser: 0.5 mg/dL (ref 0.44–1.00)
GFR calc Af Amer: >60 mL/min (ref >60)
GFR calc non Af Amer: >60 mL/min (ref >60)
Glucose, Bld: 136 mg/dL — ABNORMAL HIGH (ref 65–99)
POTASSIUM: 4.8 mmol/L (ref 3.5–5.1)
SODIUM: 139 mmol/L (ref 135–145)

## 2016-05-19 LAB — CBC
HEMATOCRIT: 39.5 % (ref 36.0–46.0)
HEMOGLOBIN: 12.3 g/dL (ref 12.0–15.0)
MCH: 31.1 pg (ref 26.0–34.0)
MCHC: 31.1 g/dL (ref 30.0–36.0)
MCV: 99.7 fL (ref 78.0–100.0)
Platelets: 322 10*3/uL (ref 150–400)
RBC: 3.96 MIL/uL (ref 3.87–5.11)
RDW: 17.3 % — ABNORMAL HIGH (ref 11.5–15.5)
WBC: 15.5 10*3/uL — ABNORMAL HIGH (ref 4.0–10.5)

## 2016-05-19 LAB — HEMOGLOBIN A1C
HEMOGLOBIN A1C: 5.6 % (ref 4.8–5.6)
MEAN PLASMA GLUCOSE: 114 mg/dL

## 2016-05-19 LAB — MAGNESIUM: MAGNESIUM: 2 mg/dL (ref 1.7–2.4)

## 2016-05-19 LAB — T3: T3, Total: 123 ng/dL (ref 71–180)

## 2016-05-19 LAB — T3, FREE: T3, Free: 2.8 pg/mL (ref 2.0–4.4)

## 2016-05-19 MED ORDER — BOOST / RESOURCE BREEZE PO LIQD
1.0000 | Freq: Two times a day (BID) | ORAL | Status: DC
  Administered 2016-05-19 – 2016-05-21 (×5): 1 via ORAL

## 2016-05-19 NOTE — Progress Notes (Signed)
Initial Nutrition Assessment  DOCUMENTATION CODES:   Obesity unspecified  INTERVENTION:  - Will order Boost Breeze BID, each supplement provides 250 kcal and 9 grams of protein - Diet advancement as medically feasible. - RD will continue to monitor for needs with diet advancement.    NUTRITION DIAGNOSIS:   Inadequate protein intake related to other (see comment) (current diet order) as evidenced by other (see comment) (CLD does not meet estimated protein need).  GOAL:   Patient will meet greater than or equal to 90% of their needs  MONITOR:   PO intake, Diet advancement, Weight trends, Labs, I & O's  REASON FOR ASSESSMENT:   Consult Assessment of nutrition requirement/status  ASSESSMENT:   65 y.o. female with medical history significant of COPD on 3 L at baseline and adenocarcinoma of the lungs. Presented with shortness of breath associated with cough and wheezing at past 2 days. No associated nausea vomiting or diarrhea she attempted to use home nebulizers. Patient was states she does not wish to be intubated.  Pt seen for consult. BMI indicates obesity. No intakes documented since admission. Lunch tray was being delivered as RD left the room. Pt reports that for breakfast she had jello, coffee, and chicken broth. She denies abdominal pain or nausea associated with PO intakes this AM. She does not feel that she became SOB/increasingly SOB while consuming liquids. She states that when she is alone she is more conscious of breathing and that daughter was present during breakfast meal.   Pt reports good appetite PTA and that she was able to fix her own meals or drive to purchase food at places such as [employer]. Pt with no upper teeth and only 3 bottom teeth. She states that bottom partial no longer fits as the tooth it connected to fell out after radiation. She does not wear upper denture to eat as it causes her to "gag." Pt states that she is able to eat hamburgers and very tender  poultry.   Physical assessment shows no muscle or fat wasting to upper body; lower body not assessed at this time. Per chart review, weight has been stable (192-196 lbs) x4 months.  Medications reviewed; PRN Zofran, sliding scale Novolog.  Labs reviewed; CBG: 128 mg/dL this AM.   Diet Order:  Diet clear liquid Room service appropriate? Yes; Fluid consistency: Thin  Skin:  Wound (see comment) (Mid upper back wound)  Last BM:  Unknown  Height:   Ht Readings from Last 1 Encounters:  05/18/16 '5\' 5"'$  (1.651 m)    Weight:   Wt Readings from Last 1 Encounters:  05/18/16 194 lb (88 kg)    Ideal Body Weight:  56.82 kg  BMI:  Body mass index is 32.28 kg/m.  Estimated Nutritional Needs:   Kcal:  1935-2112 (22-24 kcal/kg)  Protein:  107-123 grams (1.2-1.4 grams/kg)  Fluid:  >/= 1.8 L/day  EDUCATION NEEDS:   No education needs identified at this time    Jarome Matin, MS, RD, LDN Inpatient Clinical Dietitian Pager # [phone] After hours/weekend pager # [phone]

## 2016-05-19 NOTE — Progress Notes (Signed)
RT removed bipap and placed pt on 12L HFNC. Sats stable. Pt in no distress. Pt eating breakfast. RN aware. Neb tx done.

## 2016-05-19 NOTE — Progress Notes (Signed)
Decreased FIO2 to 6L HFNC due to stable sats

## 2016-05-19 NOTE — Progress Notes (Signed)
PROGRESS NOTE    Heather Banks  SHF:[id number] DOB: [birth year] DOA: 05/17/2016 PCP: [name], NP    Brief Narrative:  65 year old female history of chronic respiratory failure secondary to COPD and stage IV lung cancer on 3 L nasal cannula at home presented with acute on chronic respiratory failure with hypoxia and hypercarbia secondary to COPD exacerbation.   Assessment & Plan:   Principal Problem:   Acute on chronic respiratory failure (HCC) Active Problems:   COPD with acute exacerbation (HCC)   Primary cancer of left upper lobe of lung (HCC)   Respiratory failure (HCC)   History of pulmonary embolus (PE)   Hyperglycemia   Abnormal TSH   Dyspnea   Encounter for palliative care  #1 acute on chronic respiratory failure secondary to acute COPD exacerbation Patient presented with acute on chronic respiratory failure usually on 3 L nasal cannula. ABG done on admission with a pH of 7.2 PCO2 of 62 PO2 of 135 bicarbonate of 24. Patient on the BiPAP. Patient was tried off the BiPAP this morning on nasal cannula however went into significant respiratory distress with use of accessory muscles of respiration and subsequently placed back on the BiPAP. Repeat chest x-ray negative for infiltrate. Patient was assessed by pulmonary and critical care medicine and patient is nebulizes changed to DuoNeb's. IV steroid dose is changed to 40 mg IV every 6 hours. Continue Levaquin, Claritin, Mucinex, Flonase. BiPAP as needed. Appreciate pulmonary input. Palliative care following.  #2 hyperglycemia Likely secondary to steroids. Hemoglobin A1c is 5.6. CBGs have ranged from 128-130. Sliding scale insulin.  #3 abnormal TSH/euthyroid sick syndrome TSH decreased at 0.236. T3 within normal limits. Free T3 within normal limits. Free T4 within normal limits. Likely euthyroid sick syndrome.   #4 history of stage IV non-small cell lung cancer, adenocarcinoma Patient status post systemic chemotherapy,  treatment according to BMS checkmate 370 clinical trail which was discontinued secondary to disease progression, palliative irradiation of the left upper lobe lung mass currently under observation. Will likely need to follow-up with outpatient setting with oncology. Oncology notified via [place].  #5 GERD Pepcid   #6 prognosis Patient with multiple admissions recently and history of stage IV lung cancer patient likely poor prognosis. Palliative Care has assessed the patient and is currently following.     DVT prophylaxis: Lovenox Code Status: Partial code Family Communication: Updated patient. No family at bedside. Disposition Plan: Home when medically stable.   Consultants:   PCCM: Dr Halford Chessman 05/18/2016  Palliative care: Dr. Rowe Pavy 05/18/2016  Procedures:   Chest x-ray 05/18/2016, 05/17/2016  Antimicrobials:   IV doxycycline 05/18/2016 1  IV cefepime 10/22/20171  IV Levaquin 05/18/2016   Subjective: Patient feels some improvement with shortness of breath. Patient was on BiPAP overnight however currently on IV flow nasal cannula and tolerating it. No chest pain. Coughing improved.  Objective: Vitals:   05/19/16 0600 05/19/16 0800 05/19/16 0810 05/19/16 0812  BP: 117/69 (!) 142/85 (!) 142/85   Pulse:   (!) 105   Resp: (!) '21 19 17   '$ Temp:  98.2 F (36.8 C)    TempSrc:  Axillary    SpO2: 97% 98% 95% 96%  Weight:      Height:        Intake/Output Summary (Last 24 hours) at 05/19/16 0953 Last data filed at 05/19/16 0700  Gross per 24 hour  Intake              675 ml  Output  350 ml  Net              325 ml   Filed Weights   05/18/16 0919  Weight: 88 kg (194 lb)    Examination:  General exam: Sitting upright in bed. In respiratory distress. BiPAP on. Use of accessory muscles of respiration.  Respiratory system: Decreased Coarse diffuse breath sounds anterior lung fields. Less wheezing. Poor air movement. Less use of accessory muscles of  respiration. On high flow nasal cannula.  Cardiovascular system: Tachycardia. No JVD, murmurs, rubs, gallops or clicks. No pedal edema. Gastrointestinal system: Abdomen is nondistended, soft and nontender. No organomegaly or masses felt. Normal bowel sounds heard. Central nervous system: Alert and oriented. No focal neurological deficits. Extremities: Symmetric 5 x 5 power. Skin: No rashes, lesions or ulcers Psychiatry: Judgement and insight appear normal. Mood & affect appropriate.     Data Reviewed: I have personally reviewed following labs and imaging studies  CBC:  Recent Labs Lab 05/17/16 2235 05/18/16 0338 05/19/16 0322  WBC 12.6* 9.6 15.5*  NEUTROABS 10.9*  --   --   HGB 13.8 13.1 12.3  HCT 42.4 42.4 39.5  MCV 96.1 98.6 99.7  PLT 359 332 008   Basic Metabolic Panel:  Recent Labs Lab 05/17/16 2235 05/18/16 0338 05/19/16 0322  NA 141 140 139  K 4.1 4.0 4.8  CL 108 106 104  CO2 '23 22 26  '$ GLUCOSE 190* 233* 136*  BUN '14 15 16  '$ CREATININE 0.57 0.60 0.50  CALCIUM 8.9 8.9 9.1  MG  --  2.4 2.0  PHOS  --  2.2*  --    GFR: Estimated Creatinine Clearance: 78.9 mL/min (by C-G formula based on SCr of 0.5 mg/dL). Liver Function Tests:  Recent Labs Lab 05/18/16 0338  AST 19  ALT 12*  ALKPHOS 97  BILITOT 0.5  PROT 7.4  ALBUMIN 3.9   No results for input(s): LIPASE, AMYLASE in the last 168 hours. No results for input(s): AMMONIA in the last 168 hours. Coagulation Profile: No results for input(s): INR, PROTIME in the last 168 hours. Cardiac Enzymes: No results for input(s): CKTOTAL, CKMB, CKMBINDEX, TROPONINI in the last 168 hours. BNP (last 3 results) No results for input(s): PROBNP in the last 8760 hours. HbA1C:  Recent Labs  05/18/16 0330  HGBA1C 5.6   CBG:  Recent Labs Lab 05/18/16 0811 05/18/16 1142 05/18/16 1818 05/18/16 2346 05/19/16 0637  GLUCAP 170* 155* 134* 130* 128*   Lipid Profile: No results for input(s): CHOL, HDL, LDLCALC, TRIG,  CHOLHDL, LDLDIRECT in the last 72 hours. Thyroid Function Tests:  Recent Labs  05/18/16 0338 05/18/16 1056  TSH 0.236*  --   FREET4  --  0.67  T3FREE  --  2.8   Anemia Panel: No results for input(s): VITAMINB12, FOLATE, FERRITIN, TIBC, IRON, RETICCTPCT in the last 72 hours. Sepsis Labs: No results for input(s): PROCALCITON, LATICACIDVEN in the last 168 hours.  Recent Results (from the past 240 hour(s))  MRSA PCR Screening     Status: None   Collection Time: 05/18/16  3:32 AM  Result Value Ref Range Status   MRSA by PCR NEGATIVE NEGATIVE Final    Comment:        The GeneXpert MRSA Assay (FDA approved for NASAL specimens only), is one component of a comprehensive MRSA colonization surveillance program. It is not intended to diagnose MRSA infection nor to guide or monitor treatment for MRSA infections.  Radiology Studies: Dg Chest Port 1 View  Result Date: 05/18/2016 CLINICAL DATA:  Respiratory failure EXAM: PORTABLE CHEST 1 VIEW COMPARISON:  05/17/2016 FINDINGS: Right chest wall port is again seen. The changes in the apex on the left are less well visualized on this exam due to patient positioning. No other focal abnormality is seen. IMPRESSION: No new focal abnormality noted. The changes in the left apex are less well visualized on the current exam. Electronically Signed   By: Inez Catalina M.D.   On: 05/18/2016 10:06   Dg Chest Port 1 View  Result Date: 05/17/2016 CLINICAL DATA:  Dyspnea after breathing treatment. History of adenocarcinoma of the left lung, COPD and pneumonia. EXAM: PORTABLE CHEST 1 VIEW COMPARISON:  04/10/2016 chest CT, 04/01/2016 chest radiograph FINDINGS: Cardiac silhouette is normal for size. Porta catheter tip is seen in the distal SVC. In the aortic atherosclerosis without aneurysm. Mild vascular congestion noted. Masslike opacity at the left lung base consistent with known left upper lobe neoplasm. This appears slightly more prominent than  on prior 04/01/2016 exam though margins are difficult to ascertain given overlap from adjacent ribs. Minimal scarring at the right upper lobe. IMPRESSION: Subjectively more prominent left upper lobe mass. Mild pulmonary vascular congestion. No acute pneumonic consolidation. Electronically Signed   By: Ashley Royalty M.D.   On: 05/17/2016 23:13        Scheduled Meds: . chlorhexidine  15 mL Mouth Rinse BID  . enoxaparin (LOVENOX) injection  40 mg Subcutaneous Q24H  . famotidine (PEPCID) IV  20 mg Intravenous Q24H  . fluticasone  2 spray Each Nare Daily  . guaiFENesin  1,200 mg Oral BID  . Influenza vac split quadrivalent PF  0.5 mL Intramuscular Tomorrow-1000  . insulin aspart  0-9 Units Subcutaneous Q6H  . ipratropium-albuterol  3 mL Nebulization Q6H  . levofloxacin (LEVAQUIN) IV  750 mg Intravenous Q24H  . loratadine  10 mg Oral Daily  . mouth rinse  15 mL Mouth Rinse q12n4p  . methylPREDNISolone (SOLU-MEDROL) injection  40 mg Intravenous Q6H  . sodium chloride flush  3 mL Intravenous Q12H   Continuous Infusions:     LOS: 1 day    Time spent: 40 minutes    THOMPSON,DANIEL, MD [facility] Pager [phone]  If 7PM-7AM, please contact night-coverage [email or web address] Password [email or web address] 05/19/2016, 9:53 AM

## 2016-05-19 NOTE — Progress Notes (Signed)
Daily Progress Note   Patient Name: Heather Banks       Date: 05/19/2016 DOB: Dec 21, 1950  Age: 65 y.o. MRN#: [id number] Attending Physician: Eugenie Filler, MD Primary Care Physician: [name], NP Admit Date: 05/17/2016  Reason for Consultation/Follow-up: Establishing goals of care  Subjective:  patient is sitting up in bed, she is off the BiPAP, she is on high flow O2 7L. She is awake alert, is able to speak in full sentences.   Length of Stay: 1  Current Medications: Scheduled Meds:  . chlorhexidine  15 mL Mouth Rinse BID  . enoxaparin (LOVENOX) injection  40 mg Subcutaneous Q24H  . famotidine (PEPCID) IV  20 mg Intravenous Q24H  . feeding supplement  1 Container Oral BID BM  . fluticasone  2 spray Each Nare Daily  . guaiFENesin  1,200 mg Oral BID  . insulin aspart  0-9 Units Subcutaneous Q6H  . ipratropium-albuterol  3 mL Nebulization Q6H  . levofloxacin (LEVAQUIN) IV  750 mg Intravenous Q24H  . loratadine  10 mg Oral Daily  . mouth rinse  15 mL Mouth Rinse q12n4p  . methylPREDNISolone (SOLU-MEDROL) injection  40 mg Intravenous Q6H  . sodium chloride flush  3 mL Intravenous Q12H    Continuous Infusions:    PRN Meds: acetaminophen **OR** acetaminophen, albuterol, aspirin-acetaminophen-caffeine, diazepam, ketorolac, morphine injection, ondansetron **OR** ondansetron (ZOFRAN) IV, prochlorperazine, traMADol  Physical Exam         Awake alert Coarse breath sounds anterior lung fields, less wheezing Abdomen soft non distended Trace edema Non focal  Vital Signs: BP (!) 147/96   Pulse (!) 105   Temp 98.1 F (36.7 C) (Oral)   Resp 18   Ht '5\' 5"'$  (1.651 m)   Wt 88 kg (194 lb)   SpO2 96%   BMI 32.28 kg/m  SpO2: SpO2: 96 % O2 Device: O2 Device: High Flow Nasal  Cannula O2 Flow Rate: O2 Flow Rate (L/min): 6 L/min  Intake/output summary:  Intake/Output Summary (Last 24 hours) at 05/19/16 1541 Last data filed at 05/19/16 1302  Gross per 24 hour  Intake              875 ml  Output              900 ml  Net              -  25 ml   LBM:   Baseline Weight: Weight: 88 kg (194 lb) Most recent weight: Weight: 88 kg (194 lb)       Palliative Assessment/Data:    Flowsheet Rows   Flowsheet Row Most Recent Value  Intake Tab  Referral Department  Hospitalist  Unit at Time of Referral  ICU  Palliative Care Primary Diagnosis  Cancer  Palliative Care Type  Return patient Palliative Care  Reason for referral  Clarify Goals of Care  Date first seen by Palliative Care  05/18/16  Clinical Assessment  Palliative Performance Scale Score  30%  Pain Max last 24 hours  3  Pain Min Last 24 hours  2  Dyspnea Max Last 24 Hours  3  Dyspnea Min Last 24 hours  2  Nausea Max Last 24 Hours  3  Nausea Min Last 24 Hours  2  Anxiety Max Last 24 Hours  5  Anxiety Min Last 24 Hours  4  Psychosocial & Spiritual Assessment  Palliative Care Outcomes  Patient/Family meeting held?  Yes  Who was at the meeting?  patient.   Palliative Care Outcomes  Clarified goals of care  Palliative Care follow-up planned  Yes, Facility      Patient Active Problem List   Diagnosis Date Noted  . Acute on chronic respiratory failure ([place]) 05/18/2016  . Hyperglycemia 05/18/2016  . Abnormal TSH 05/18/2016  . Dyspnea   . Encounter for palliative care   . Compression fracture of thoracic vertebra (HCC) 04/21/2016  . HCAP (healthcare-associated pneumonia) 04/01/2016  . Vertebral fracture, pathological 02/28/2016  . GERD (gastroesophageal reflux disease) 02/28/2016  . COPD with acute exacerbation ([place]) 02/28/2016  . History of pulmonary embolus (PE) 02/06/2016  . Respiratory failure ([place]) 01/18/2016  . Primary cancer of left upper lobe of lung ([place]) 10/24/2015  . Drug-induced skin  rash 07/31/2015  . Goals of care, counseling/discussion 06/19/2015  . Chronic fatigue 05/22/2015  . Nausea without vomiting 05/22/2015  . Full code status 05/01/2015  . COPD (chronic obstructive pulmonary disease) ([place]) 02/05/2015  . Encounter for antineoplastic chemotherapy 01/21/2015  . Depression (emotion) 09/05/2014  . Generalized anxiety disorder 08/21/2014  . Smoking 07/11/2014  . Adenocarcinoma of left lung, stage 4 ([place]) 07/09/2014    Palliative Care Assessment & Plan   Patient Profile:    Assessment:  acute on chronic respiratory failure Acute COPD exacerbation History of lung cancer dyspnea  Recommendations/Plan:   now off BIPAP, on high flow O2. No change in goals. She wishes to continue current goals of care. Palliative will continue to follow along.    Code Status:    Code Status Orders        Start     Ordered   05/18/16 0209  Limited resuscitation (code)  Continuous    Question Answer Comment  In the event of cardiac or respiratory ARREST: Initiate Code Blue, Call Rapid Response Yes   In the event of cardiac or respiratory ARREST: Perform CPR Yes   In the event of cardiac or respiratory ARREST: Perform Intubation/Mechanical Ventilation No   In the event of cardiac or respiratory ARREST: Use NIPPV/BiPAp only if indicated Yes   In the event of cardiac or respiratory ARREST: Administer ACLS medications if indicated Yes   In the event of cardiac or respiratory ARREST: Perform Defibrillation or Cardioversion if indicated Yes      05/18/16 0209    Code Status History    Date Active Date Inactive Code Status Order ID Comments  User Context   04/01/2016  4:21 PM 04/05/2016  3:38 PM Partial Code [id number]  Tawni Millers, MD Inpatient   04/01/2016  2:29 PM 04/01/2016  4:21 PM Partial Code [id number]  Macarthur Critchley, MD ED   02/28/2016 12:45 AM 03/04/2016  2:50 PM Partial Code [id number]  Vianne Bulls, MD Inpatient   01/18/2016  4:34 PM 01/22/2016  7:43 PM  Partial Code [id number] I spoke with pt's daughter Rema Jasmine (person who makes decision for pt) and she said pt is everything except intubation. Jose Shirl Harris, MD Inpatient   09/07/2014 11:15 AM 09/08/2014  3:49 AM Full Code [id number]  Arne Cleveland, MD [place]   08/25/2014  3:27 PM 08/26/2014  3:54 AM Full Code [id number]  Sandi Mariscal, MD [place]   07/09/2014 10:33 PM 07/12/2014  8:14 PM Full Code [id number]  Jani Gravel, MD Inpatient    Advance Directive Documentation   Flowsheet Row Most Recent Value  Type of Advance Directive  Living will, Mental Health Advance Directive  Pre-existing out of facility DNR order (yellow form or pink MOST form)  No data  "MOST" Form in Place?  No data       Prognosis:   guarded.   Discharge Planning:  To Be Determined  Care plan was discussed with  Patient and Dr Grandville Silos.   Thank you for allowing the Palliative Medicine Team to assist in the care of this patient.   Time In: 9 Time Out: 925 Total Time 25 Prolonged Time Billed  no       Greater than 50%  of this time was spent counseling and coordinating care related to the above assessment and plan.  Loistine Chance, MD [phone]  Please contact Palliative Medicine Team phone at [phone] for questions and concerns.

## 2016-05-19 NOTE — Evaluation (Signed)
Occupational Therapy Evaluation Patient Details Name: Heather Banks MRN: [id number] DOB: 1950-09-16 Today's Date: 05/19/2016    History of Present Illness 65 year old female history of chronic respiratory failure secondary to COPD and stage IV lung cancer on 3 L nasal cannula at home presented with acute on chronic respiratory failure with hypoxia and hypercarbia secondary to COPD exacerbation.   Clinical Impression   Pt admitted with respiratory failure. Pt currently with functional limitations due to the deficits listed below (see OT Problem List). Pt will benefit from skilled OT to increase their safety and independence with ADL and functional mobility for ADL to facilitate discharge to venue listed below.      Follow Up Recommendations  Home health OT    Equipment Recommendations  3 in 1 bedside comode       Precautions / Restrictions Precautions Precautions: Fall      Mobility Bed Mobility               General bed mobility comments: pt in chair   Transfers Overall transfer level: Needs assistance Equipment used: Rolling walker (2 wheeled) Transfers: Sit to/from [facility] Sit to Stand: Min assist Stand pivot transfers: Min assist       General transfer comment: pt needed VC for hand placement and techique         ADL Overall ADL's : Needs assistance/impaired     Grooming: Set up;Sitting   Upper Body Bathing: Sitting;Minimal assitance   Lower Body Bathing: Maximal assistance;Sit to/from stand;Cueing for safety;Cueing for sequencing   Upper Body Dressing : Minimal assistance;Sitting   Lower Body Dressing: Maximal assistance;Sit to/from stand;Cueing for safety;Cueing for sequencing   Toilet Transfer: BSC;Stand-pivot;Squat-pivot;Cueing for safety;Minimal assistance   Toileting- Clothing Manipulation and Hygiene: Minimal assistance;Sit to/from stand;Cueing for safety;Cueing for sequencing         General ADL Comments: Pt  needed VC to take deep breathes.  Pt tended to tense shoulders and nose breath only which appeared as she was holding her breath.                 Pertinent Vitals/Pain Pain Assessment: No/denies pain        Extremity/Trunk Assessment Upper Extremity Assessment Upper Extremity Assessment: Generalized weakness           Communication Communication Communication: No difficulties   Cognition Arousal/Alertness: Awake/alert Behavior During Therapy: WFL for tasks assessed/performed Overall Cognitive Status: Within Functional Limits for tasks assessed                     General Comments    Max VC for deep breathing           Home Living Family/patient expects to be discharged to:: Private residence Living Arrangements: Parent Available Help at Discharge: Family Type of Home: House Home Access: Level entry     Home Layout: One level     Bathroom Shower/Tub: Occupational psychologist: Standard     Home Equipment: None          Prior Functioning/Environment Level of Independence: Independent                 OT Problem List: Decreased strength;Decreased activity tolerance;Decreased safety awareness   OT Treatment/Interventions: Self-care/ADL training;DME and/or AE instruction;Patient/family education    OT Goals(Current goals can be found in the care plan section) Acute Rehab OT Goals Patient Stated Goal: get well OT Goal Formulation: With patient Time For Goal Achievement: 05/26/16 Potential to Achieve  Goals: Good  OT Frequency: Min 2X/week              End of Session Nurse Communication: Mobility status  Activity Tolerance: Patient limited by fatigue Patient left: in chair   Time: 1310-1330 OT Time Calculation (min): 20 min Charges:  OT General Charges $OT Visit: 1 Procedure OT Evaluation $OT Eval Moderate Complexity: 1 Procedure G-Codes:    Payton Mccallum D 06-05-2016, 1:46 PM

## 2016-05-19 NOTE — Evaluation (Signed)
Physical Therapy Evaluation Patient Details Name: Heather Banks MRN: [id number] DOB: 05-Apr-1951 Today's Date: 05/19/2016   History of Present Illness  65 year old female history of chronic respiratory failure secondary to COPD and stage IV lung cancer on 3 L nasal cannula at home presented with acute on chronic respiratory failure with hypoxia and hypercarbia secondary to COPD exacerbation.  Clinical Impression  On eval, pt required Min assist for mobility. She walked ~15'x2 in room while holding on to IV pole. O2 sats 93% 6L O2 at rest, 82% 6L O2 during activity. Dyspnea 3/4. Will continue to follow and progress activity as tolerated. Pt is unsteady however she declines RW use.     Follow Up Recommendations Home health PT;Supervision/Assistance - 24 hour    Equipment Recommendations   (to be determined-pt is hesistant to agree to RW use)    Recommendations for Other Services       Precautions / Restrictions Precautions Precautions: Fall Precaution Comments: monitor O2 sats Restrictions Weight Bearing Restrictions: No      Mobility  Bed Mobility               General bed mobility comments: pt in chair   Transfers Overall transfer level: Needs assistance Equipment used: Rolling walker (2 wheeled) Transfers: Sit to/from Stand Sit to Stand: Min assist Stand pivot transfers: Min assist       General transfer comment: Assist to steady.  Ambulation/Gait Ambulation/Gait assistance: Min assist Ambulation Distance (Feet): 15 Feet (x2) Assistive device:  (IV pole) Gait Pattern/deviations: Step-through pattern;Decreased stride length     General Gait Details: unsteady. Pt at times reaching out for objects to hold on to, in addition to IV pole. Pt refused RW use.   Stairs            Wheelchair Mobility    Modified Rankin (Stroke Patients Only)       Balance Overall balance assessment: Needs assistance           Standing balance-Leahy Scale: Poor                                Pertinent Vitals/Pain Pain Assessment: No/denies pain    Home Living Family/patient expects to be discharged to:: Private residence Living Arrangements: Parent Available Help at Discharge: Family Type of Home: House Home Access: Level entry     Home Layout: One level Home Equipment: Kasandra Knudsen - single point      Prior Function Level of Independence: Independent with assistive device(s)         Comments: uses cane PRN     Hand Dominance        Extremity/Trunk Assessment   Upper Extremity Assessment: Defer to OT evaluation           Lower Extremity Assessment: Generalized weakness      Cervical / Trunk Assessment: Normal  Communication   Communication: No difficulties  Cognition Arousal/Alertness: Awake/alert Behavior During Therapy: WFL for tasks assessed/performed Overall Cognitive Status: Within Functional Limits for tasks assessed                      General Comments      Exercises     Assessment/Plan    PT Assessment Patient needs continued PT services  PT Problem List Decreased strength;Decreased mobility;Decreased activity tolerance;Cardiopulmonary status limiting activity;Decreased balance;Decreased knowledge of use of DME          PT Treatment Interventions DME  instruction;Therapeutic activities;Gait training;Therapeutic exercise;Functional mobility training;Balance training;Patient/family education    PT Goals (Current goals can be found in the Care Plan section)  Acute Rehab PT Goals Patient Stated Goal: get well PT Goal Formulation: With patient Time For Goal Achievement: 06/02/16 Potential to Achieve Goals: Fair    Frequency Min 3X/week   Barriers to discharge        Co-evaluation               End of Session Equipment Utilized During Treatment: Oxygen Activity Tolerance: Patient limited by fatigue (Limited by dyspnea, drop in O2 sats) Patient left: in chair;with call  bell/phone within reach;with chair alarm set           Time: 6803-2122 PT Time Calculation (min) (ACUTE ONLY): 21 min   Charges:   PT Evaluation $PT Eval Low Complexity: 1 Procedure     PT G Codes:        Weston Anna, MPT Pager: [phone]

## 2016-05-19 NOTE — Progress Notes (Signed)
[facility] PCCM  Chart reviewed, patient of Dr. Elsworth Soho with advanced COPD and stage IV lung cancer.  PCCM consulted yesterday as well as palliative consultation.  Both consult notes reviewed.  I agree with Dr. Halford Chessman and Dr. Rowe Pavy.  If PCCM can be of further assistance please let us know.  Will sign off.  Roselie Awkward, MD [facility] Pager: [phone] Cell: [phone] After 3pm or if no response, call [phone]

## 2016-05-20 DIAGNOSIS — J441 Chronic obstructive pulmonary disease with (acute) exacerbation: Secondary | ICD-10-CM

## 2016-05-20 LAB — CBC
HCT: 40.3 % (ref 36.0–46.0)
Hemoglobin: 12.5 g/dL (ref 12.0–15.0)
MCH: 30.7 pg (ref 26.0–34.0)
MCHC: 31 g/dL (ref 30.0–36.0)
MCV: 99 fL (ref 78.0–100.0)
PLATELETS: 319 10*3/uL (ref 150–400)
RBC: 4.07 MIL/uL (ref 3.87–5.11)
RDW: 16.7 % — AB (ref 11.5–15.5)
WBC: 13 10*3/uL — AB (ref 4.0–10.5)

## 2016-05-20 LAB — GLUCOSE, CAPILLARY
GLUCOSE-CAPILLARY: 167 mg/dL — AB (ref 65–99)
GLUCOSE-CAPILLARY: 172 mg/dL — AB (ref 65–99)
GLUCOSE-CAPILLARY: 135 mg/dL — AB (ref 65–99)
Glucose-Capillary: 147 mg/dL — ABNORMAL HIGH (ref 65–99)

## 2016-05-20 LAB — BASIC METABOLIC PANEL
Anion gap: 9 (ref 5–15)
BUN: 13 mg/dL (ref 6–20)
CALCIUM: 9.2 mg/dL (ref 8.9–10.3)
CO2: 29 mmol/L (ref 22–32)
CREATININE: 0.43 mg/dL — AB (ref 0.44–1.00)
Chloride: 101 mmol/L (ref 101–111)
GFR calc non Af Amer: >60 mL/min (ref >60)
Glucose, Bld: 149 mg/dL — ABNORMAL HIGH (ref 65–99)
Potassium: 4.3 mmol/L (ref 3.5–5.1)
SODIUM: 139 mmol/L (ref 135–145)

## 2016-05-20 MED ORDER — FAMOTIDINE 20 MG PO TABS
20.0000 mg | ORAL_TABLET | Freq: Every day | ORAL | Status: DC
  Administered 2016-05-21 – 2016-05-24 (×4): 20 mg via ORAL
  Filled 2016-05-20 (×4): qty 1

## 2016-05-20 NOTE — Progress Notes (Signed)
PROGRESS NOTE    Heather Banks  [id number]:[id number] DOB: 09-06-1950 DOA: 05/17/2016 PCP: [name], NP    Brief Narrative:  65 year old female history of chronic respiratory failure secondary to COPD and stage IV lung cancer on 3 L nasal cannula at home presented with acute on chronic respiratory failure with hypoxia and hypercarbia secondary to COPD exacerbation. Patient was on the BiPAP and now on high flow nasal cannula. Palliative care and pulmonary have assessed the patient.   Assessment & Plan:   Principal Problem:   Acute on chronic respiratory failure (HCC) Active Problems:   COPD with acute exacerbation (HCC)   Primary cancer of left upper lobe of lung (HCC)   Respiratory failure (HCC)   History of pulmonary embolus (PE)   Hyperglycemia   Abnormal TSH   Dyspnea   Encounter for palliative care   COPD exacerbation ([place])  #1 acute on chronic respiratory failure secondary to acute COPD exacerbation Patient presented with acute on chronic respiratory failure usually on 3 L nasal cannula. ABG done on admission with a pH of 7.2 PCO2 of 62 PO2 of 135 bicarbonate of 24. Patient on the BiPAP for 2 hours last night. Patient currently now on high flow nasal cannula at 10 L. With less use of accessory muscles of respiration.  Repeat chest x-ray negative for infiltrate. Patient was assessed by pulmonary and critical care medicine and patient's nebulizes changed to DuoNeb's. IV steroid dose is changed to 40 mg IV every 6 hours. Continue Levaquin, Claritin, Mucinex, Flonase. BiPAP as needed. Appreciate pulmonary input. Palliative care following.  #2 hyperglycemia Likely secondary to steroids. Hemoglobin A1c is 5.6. CBGs have ranged from 128-167. Sliding scale insulin.  #3 abnormal TSH/euthyroid sick syndrome TSH decreased at 0.236. T3 within normal limits. Free T3 within normal limits. Free T4 within normal limits. Likely euthyroid sick syndrome.   #4 history of stage IV non-small cell  lung cancer, adenocarcinoma Patient status post systemic chemotherapy, treatment according to BMS checkmate 370 clinical trail which was discontinued secondary to disease progression, palliative irradiation of the left upper lobe lung mass currently under observation. Will likely need to follow-up with outpatient setting with oncology. Oncology notified via [place].  #5 GERD Pepcid   #6 prognosis Patient with multiple admissions recently and history of stage IV lung cancer patient likely poor prognosis. Palliative Care has assessed the patient and is currently following.     DVT prophylaxis: Lovenox Code Status: Partial code Family Communication: Updated patient. No family at bedside. Disposition Plan: Home when medically stable.   Consultants:   PCCM: Dr Halford Chessman 05/18/2016  Palliative care: Dr. Rowe Pavy 05/18/2016  Procedures:   Chest x-ray 05/18/2016, 05/17/2016  Antimicrobials:   IV doxycycline 05/18/2016 1  IV cefepime 10/22/20171  IV Levaquin 05/18/2016   Subjective: Patient feels some improvement with shortness of breath. Patient was on BiPAP for 2 hours per patient currently on high flow nasal cannula at 10 L. Coughing improved. No chest pain.   Objective: Vitals:   05/20/16 0800 05/20/16 0828 05/20/16 0852 05/20/16 1000  BP: (!) 178/95  (!) 121/97 (!) 151/81  Pulse:  (!) 106    Resp: 13 19 (!) 27 (!) 24  Temp: 97.6 F (36.4 C)     TempSrc: Oral     SpO2: 97% 93% (!) 80%   Weight:      Height:        Intake/Output Summary (Last 24 hours) at 05/20/16 1029 Last data filed at 05/20/16 0856  Gross per  24 hour  Intake              320 ml  Output             1300 ml  Net             -980 ml   Filed Weights   05/18/16 0919  Weight: 88 kg (194 lb)    Examination:  General exam: Sitting upright in bed. In respiratory distress. BiPAP on. Use of accessory muscles of respiration.  Respiratory system: Decreased Coarse diffuse breath sounds anterior lung fields.  Less wheezing. Poor air movement. Less use of accessory muscles of respiration. On high flow nasal cannula 10L.  Cardiovascular system: Tachycardia. No JVD, murmurs, rubs, gallops or clicks. No pedal edema. Gastrointestinal system: Abdomen is nondistended, soft and nontender. No organomegaly or masses felt. Normal bowel sounds heard. Central nervous system: Alert and oriented. No focal neurological deficits. Extremities: Symmetric 5 x 5 power. Skin: No rashes, lesions or ulcers Psychiatry: Judgement and insight appear normal. Mood & affect appropriate.     Data Reviewed: I have personally reviewed following labs and imaging studies  CBC:  Recent Labs Lab 05/17/16 2235 05/18/16 0338 05/19/16 0322 05/20/16 0330  WBC 12.6* 9.6 15.5* 13.0*  NEUTROABS 10.9*  --   --   --   HGB 13.8 13.1 12.3 12.5  HCT 42.4 42.4 39.5 40.3  MCV 96.1 98.6 99.7 99.0  PLT 359 332 322 485   Basic Metabolic Panel:  Recent Labs Lab 05/17/16 2235 05/18/16 0338 05/19/16 0322 05/20/16 0330  NA 141 140 139 139  K 4.1 4.0 4.8 4.3  CL 108 106 104 101  CO2 '23 22 26 29  '$ GLUCOSE 190* 233* 136* 149*  BUN '14 15 16 13  '$ CREATININE 0.57 0.60 0.50 0.43*  CALCIUM 8.9 8.9 9.1 9.2  MG  --  2.4 2.0  --   PHOS  --  2.2*  --   --    GFR: Estimated Creatinine Clearance: 78.9 mL/min (by C-G formula based on SCr of 0.43 mg/dL (L)). Liver Function Tests:  Recent Labs Lab 05/18/16 0338  AST 19  ALT 12*  ALKPHOS 97  BILITOT 0.5  PROT 7.4  ALBUMIN 3.9   No results for input(s): LIPASE, AMYLASE in the last 168 hours. No results for input(s): AMMONIA in the last 168 hours. Coagulation Profile: No results for input(s): INR, PROTIME in the last 168 hours. Cardiac Enzymes: No results for input(s): CKTOTAL, CKMB, CKMBINDEX, TROPONINI in the last 168 hours. BNP (last 3 results) No results for input(s): PROBNP in the last 8760 hours. HbA1C:  Recent Labs  05/18/16 0330  HGBA1C 5.6   CBG:  Recent Labs Lab  05/19/16 0637 05/19/16 1106 05/19/16 1728 05/19/16 2322 05/20/16 0609  GLUCAP 128* 121* 156* 128* 167*   Lipid Profile: No results for input(s): CHOL, HDL, LDLCALC, TRIG, CHOLHDL, LDLDIRECT in the last 72 hours. Thyroid Function Tests:  Recent Labs  05/18/16 0338 05/18/16 1056  TSH 0.236*  --   FREET4  --  0.67  T3FREE  --  2.8   Anemia Panel: No results for input(s): VITAMINB12, FOLATE, FERRITIN, TIBC, IRON, RETICCTPCT in the last 72 hours. Sepsis Labs: No results for input(s): PROCALCITON, LATICACIDVEN in the last 168 hours.  Recent Results (from the past 240 hour(s))  MRSA PCR Screening     Status: None   Collection Time: 05/18/16  3:32 AM  Result Value Ref Range Status   MRSA by PCR  NEGATIVE NEGATIVE Final    Comment:        The GeneXpert MRSA Assay (FDA approved for NASAL specimens only), is one component of a comprehensive MRSA colonization surveillance program. It is not intended to diagnose MRSA infection nor to guide or monitor treatment for MRSA infections.          Radiology Studies: No results found.      Scheduled Meds: . chlorhexidine  15 mL Mouth Rinse BID  . enoxaparin (LOVENOX) injection  40 mg Subcutaneous Q24H  . famotidine (PEPCID) IV  20 mg Intravenous Q24H  . feeding supplement  1 Container Oral BID BM  . fluticasone  2 spray Each Nare Daily  . guaiFENesin  1,200 mg Oral BID  . insulin aspart  0-9 Units Subcutaneous Q6H  . ipratropium-albuterol  3 mL Nebulization Q6H  . levofloxacin (LEVAQUIN) IV  750 mg Intravenous Q24H  . loratadine  10 mg Oral Daily  . mouth rinse  15 mL Mouth Rinse q12n4p  . methylPREDNISolone (SOLU-MEDROL) injection  40 mg Intravenous Q6H  . sodium chloride flush  3 mL Intravenous Q12H   Continuous Infusions:     LOS: 2 days    Time spent: 40 minutes    Blanch Stang, MD [facility] Pager [phone]  If 7PM-7AM, please contact night-coverage [email or web address] Password  [email or web address] 05/20/2016, 10:29 AM

## 2016-05-21 ENCOUNTER — Telehealth: Payer: Self-pay | Admitting: Pulmonary Disease

## 2016-05-21 DIAGNOSIS — R946 Abnormal results of thyroid function studies: Secondary | ICD-10-CM

## 2016-05-21 DIAGNOSIS — J441 Chronic obstructive pulmonary disease with (acute) exacerbation: Principal | ICD-10-CM

## 2016-05-21 LAB — BASIC METABOLIC PANEL
Anion gap: 5 (ref 5–15)
BUN: 16 mg/dL (ref 6–20)
CHLORIDE: 98 mmol/L — AB (ref 101–111)
CO2: 33 mmol/L — AB (ref 22–32)
CREATININE: 0.57 mg/dL (ref 0.44–1.00)
Calcium: 9 mg/dL (ref 8.9–10.3)
GFR calc Af Amer: >60 mL/min (ref >60)
GFR calc non Af Amer: >60 mL/min (ref >60)
GLUCOSE: 137 mg/dL — AB (ref 65–99)
Potassium: 4.4 mmol/L (ref 3.5–5.1)
SODIUM: 136 mmol/L (ref 135–145)

## 2016-05-21 LAB — CBC
HCT: 40.9 % (ref 36.0–46.0)
Hemoglobin: 12.6 g/dL (ref 12.0–15.0)
MCH: 30.4 pg (ref 26.0–34.0)
MCHC: 30.8 g/dL (ref 30.0–36.0)
MCV: 98.8 fL (ref 78.0–100.0)
PLATELETS: 289 10*3/uL (ref 150–400)
RBC: 4.14 MIL/uL (ref 3.87–5.11)
RDW: 16.1 % — AB (ref 11.5–15.5)
WBC: 8.6 10*3/uL (ref 4.0–10.5)

## 2016-05-21 LAB — GLUCOSE, CAPILLARY
Glucose-Capillary: 126 mg/dL — ABNORMAL HIGH (ref 65–99)
Glucose-Capillary: 163 mg/dL — ABNORMAL HIGH (ref 65–99)
Glucose-Capillary: 138 mg/dL — ABNORMAL HIGH (ref 65–99)

## 2016-05-21 MED ORDER — DIPHENHYDRAMINE HCL 25 MG PO CAPS
25.0000 mg | ORAL_CAPSULE | Freq: Every evening | ORAL | Status: DC | PRN
  Administered 2016-05-21 – 2016-05-23 (×2): 25 mg via ORAL
  Filled 2016-05-21 (×2): qty 1

## 2016-05-21 MED ORDER — METHYLPREDNISOLONE SODIUM SUCC 40 MG IJ SOLR
40.0000 mg | Freq: Two times a day (BID) | INTRAMUSCULAR | Status: DC
  Administered 2016-05-21 – 2016-05-22 (×2): 40 mg via INTRAVENOUS
  Filled 2016-05-21 (×2): qty 1

## 2016-05-21 MED ORDER — LEVOFLOXACIN 750 MG PO TABS
750.0000 mg | ORAL_TABLET | Freq: Every day | ORAL | Status: DC
  Administered 2016-05-22 – 2016-05-24 (×3): 750 mg via ORAL
  Filled 2016-05-21 (×3): qty 1

## 2016-05-21 NOTE — Progress Notes (Signed)
Physical Therapy Treatment Patient Details Name: Heather Banks MRN: [id number] DOB: July 07, 1951 Today's Date: 05/21/2016    History of Present Illness 65 year old female history of chronic respiratory failure secondary to COPD and stage IV lung cancer on 3 L nasal cannula at home presented with acute on chronic respiratory failure with hypoxia and hypercarbia secondary to COPD exacerbation.    PT Comments    Pt tolerated increased distance of 36' with ambulation, SaO2 89-94% on 8L O2 HFNC, HR 132 max with walking. Performed seated exercises.   Follow Up Recommendations  Home health PT;Supervision/Assistance - 24 hour     Equipment Recommendations  Rolling walker with 5" wheels (would benefit from RW but pt stated she can't afford one and has no insurance)    Recommendations for Other Services       Precautions / Restrictions Precautions Precautions: Fall Precaution Comments: monitor O2 sats Restrictions Weight Bearing Restrictions: No    Mobility  Bed Mobility Overal bed mobility: Needs Assistance Bed Mobility: Supine to Sit     Supine to sit: Supervision     General bed mobility comments: up in recliner  Transfers Overall transfer level: Needs assistance Equipment used: None Transfers: Sit to/from Stand Sit to Stand: Min guard Stand pivot transfers: Min guard       General transfer comment: assist with lines, min/guard for balance/safety  Ambulation/Gait   Ambulation Distance (Feet): 36 Feet ((12' + 24')) Assistive device:  (IV pole) Gait Pattern/deviations: Step-through pattern;Decreased step length - right;Decreased step length - left   Gait velocity interpretation: Below normal speed for age/gender General Gait Details: ambulated with 8L O2 HFNC, SaO2 89-94% walking, HR 132 max walking, steady with IV pole, distance limited by fatigue/SOB, 2/4 dyspnea   Stairs            Wheelchair Mobility    Modified Rankin (Stroke Patients Only)        Balance Overall balance assessment: Needs assistance Sitting-balance support: Feet supported Sitting balance-Leahy Scale: Good     Standing balance support: During functional activity Standing balance-Leahy Scale: Fair Standing balance comment: Pt able to maintain balance without an assistive device for very short amounts of time during adls but fatigues so quickly.  Feel pt should be using an assistive device but in the room declines using one.                    Cognition Arousal/Alertness: Awake/alert Behavior During Therapy: WFL for tasks assessed/performed Overall Cognitive Status: Within Functional Limits for tasks assessed                      Exercises General Exercises - Lower Extremity Ankle Circles/Pumps: AROM;Both;10 reps;Seated Quad Sets: AROM;Both;5 reps;Seated Shoulder Exercises Pendulum Exercise: AROM;Both;Other (comment);Seated (1 rep, limited by fatigue)    General Comments        Pertinent Vitals/Pain Pain Assessment: 0-10 Pain Score: 6  Pain Location: rib cage left and right Pain Descriptors / Indicators: Aching Pain Intervention(s): Limited activity within patient's tolerance;Monitored during session;Premedicated before session    Home Living                      Prior Function            PT Goals (current goals can now be found in the care plan section) Acute Rehab PT Goals Patient Stated Goal: get well PT Goal Formulation: With patient Time For Goal Achievement: 06/02/16 Potential to Achieve Goals: Fair Progress  towards PT goals: Progressing toward goals    Frequency    Min 3X/week      PT Plan Current plan remains appropriate    Co-evaluation             End of Session Equipment Utilized During Treatment: Gait belt;Oxygen Activity Tolerance: Patient limited by fatigue Patient left: in chair;with call bell/phone within reach;with chair alarm set     Time: 7473-4037 PT Time Calculation (min)  (ACUTE ONLY): 26 min  Charges:  $Gait Training: 8-22 mins $Therapeutic Exercise: 8-22 mins                    G Codes:      Philomena Doheny 05/21/2016, 11:44 AM [phone]

## 2016-05-21 NOTE — Progress Notes (Signed)
RT arrived to give Pt breathing treatment.  Found Pt off BiPAP and back on 8Lpm HFNC.  Pt states she was unable to tolerate the BiPAP.  Education provided to Pt.  RT will continue to monitor as needed.

## 2016-05-21 NOTE — Progress Notes (Signed)
PROGRESS NOTE    Heather Banks  DQQ:[id number] DOB: [birth year] DOA: 05/17/2016 PCP: [name], NP    Brief Narrative: 65 year old female with history of COPD, stage IV lung cancer status post chemotherapy presented with acute on chronic respiratory failure with hypoxia and hypercarbia secondary to COPD exacerbation. Patient was on the BiPAP and now on high flow nasal cannula. Palliative care and pulmonary have assessed the patient.  Assessment & Plan:   # Acute on chronic respiratory failure ([place]) due to acute COPD exacerbation: Patient is off BiPAP and currently on high flow oxygen, requiring about 6-7 L of oxygen.  -Try to wean oxygen gradually. The patient denied using oxygen at home. -Changed to Solu-Medrol to twice a day -Continue Levaquin -Continue bronchodilators and breathing treatment -Pulmonary consult evaluated the patient.  #Hyperglycemia likely in the setting of a steroid: Monitor blood sugar level and cover with sliding scale.  #Abnormal TSH level/euthyroid sick syndrome: Low TSH level with normal free T3 and T4 level. Recommended outpatient follow-up to monitor her thyroid function test.   #History of stage IV non-small cell lung cancer status post chemotherapy and radiation therapy as per patient: Advised outpatient follow-up with her oncologist.   # Encounter for palliative care: Evaluated by palliative care. Patient is currently partial DO NOT RESUSCITATE.    DVT prophylaxis: Lovenox Code Status: Partial DO NOT RESUSCITATE Family Communication: Patient's daughter at bedside Disposition Plan: Likely discharge to home with home care in 1-2 days   Consultants:   Pulmonology and palliative care  Procedures: Off BiPAP now Antimicrobials: Levaquin  Subjective: Patient was seen and examined in the stepdown unit. Patient reported feeling better but he still has shortness of breath and cough. Feels weak. Denied fever, chills, nausea, vomiting, chest pain,  abdominal pain. Patient's daughter at bedside.   Objective: Vitals:   05/21/16 0750 05/21/16 0800 05/21/16 1000 05/21/16 1200  BP:  (!) 140/111 (!) 148/76   Pulse: 81     Resp: '15 19 15   '$ Temp:  98 F (36.7 C)  98.4 F (36.9 C)  TempSrc:  Oral  Oral  SpO2: 97% 95% 97%   Weight:      Height:        Intake/Output Summary (Last 24 hours) at 05/21/16 1236 Last data filed at 05/21/16 1100  Gross per 24 hour  Intake              540 ml  Output             1825 ml  Net            -1285 ml   Filed Weights   05/18/16 0919  Weight: 88 kg (194 lb)    Examination:  General exam: Appears calm and comfortable. Wearing high flow oxygen. Speaking full sentences. Respiratory system: Intermittent bilateral diffuse expiratory wheeze. No crackles appreciated Cardiovascular system: S1 & S2 heard, RRR.  No pedal edema. Gastrointestinal system: Abdomen is nondistended, soft and nontender.Normal bowel sounds heard. Central nervous system: Alert and oriented. No focal neurological deficits. Extremities: Symmetric 5 x 5 power. Skin: No rashes, lesions or ulcers Psychiatry: Judgement and insight appear normal. Mood & affect appropriate.     Data Reviewed: I have personally reviewed following labs and imaging studies  CBC:  Recent Labs Lab 05/17/16 2235 05/18/16 0338 05/19/16 0322 05/20/16 0330 05/21/16 0325  WBC 12.6* 9.6 15.5* 13.0* 8.6  NEUTROABS 10.9*  --   --   --   --   HGB 13.8  13.1 12.3 12.5 12.6  HCT 42.4 42.4 39.5 40.3 40.9  MCV 96.1 98.6 99.7 99.0 98.8  PLT 359 [phone]   Basic Metabolic Panel:  Recent Labs Lab 05/17/16 2235 05/18/16 0338 05/19/16 0322 05/20/16 0330 05/21/16 0325  NA 141 140 139 139 136  K 4.1 4.0 4.8 4.3 4.4  CL 108 106 104 101 98*  CO2 '23 22 26 29 '$ 33*  GLUCOSE 190* 233* 136* 149* 137*  BUN '14 15 16 13 16  '$ CREATININE 0.57 0.60 0.50 0.43* 0.57  CALCIUM 8.9 8.9 9.1 9.2 9.0  MG  --  2.4 2.0  --   --   PHOS  --  2.2*  --   --   --     GFR: Estimated Creatinine Clearance: 78.9 mL/min (by C-G formula based on SCr of 0.57 mg/dL). Liver Function Tests:  Recent Labs Lab 05/18/16 0338  AST 19  ALT 12*  ALKPHOS 97  BILITOT 0.5  PROT 7.4  ALBUMIN 3.9   No results for input(s): LIPASE, AMYLASE in the last 168 hours. No results for input(s): AMMONIA in the last 168 hours. Coagulation Profile: No results for input(s): INR, PROTIME in the last 168 hours. Cardiac Enzymes: No results for input(s): CKTOTAL, CKMB, CKMBINDEX, TROPONINI in the last 168 hours. BNP (last 3 results) No results for input(s): PROBNP in the last 8760 hours. HbA1C: No results for input(s): HGBA1C in the last 72 hours. CBG:  Recent Labs Lab 05/20/16 1133 05/20/16 1752 05/20/16 2312 05/21/16 0547 05/21/16 1203  GLUCAP 172* 135* 147* 126* 163*   Lipid Profile: No results for input(s): CHOL, HDL, LDLCALC, TRIG, CHOLHDL, LDLDIRECT in the last 72 hours. Thyroid Function Tests: No results for input(s): TSH, T4TOTAL, FREET4, T3FREE, THYROIDAB in the last 72 hours. Anemia Panel: No results for input(s): VITAMINB12, FOLATE, FERRITIN, TIBC, IRON, RETICCTPCT in the last 72 hours. Sepsis Labs: No results for input(s): PROCALCITON, LATICACIDVEN in the last 168 hours.  Recent Results (from the past 240 hour(s))  MRSA PCR Screening     Status: None   Collection Time: 05/18/16  3:32 AM  Result Value Ref Range Status   MRSA by PCR NEGATIVE NEGATIVE Final    Comment:        The GeneXpert MRSA Assay (FDA approved for NASAL specimens only), is one component of a comprehensive MRSA colonization surveillance program. It is not intended to diagnose MRSA infection nor to guide or monitor treatment for MRSA infections.          Radiology Studies: No results found.      Scheduled Meds: . chlorhexidine  15 mL Mouth Rinse BID  . enoxaparin (LOVENOX) injection  40 mg Subcutaneous Q24H  . famotidine  20 mg Oral Daily  . feeding supplement   1 Container Oral BID BM  . fluticasone  2 spray Each Nare Daily  . guaiFENesin  1,200 mg Oral BID  . insulin aspart  0-9 Units Subcutaneous Q6H  . ipratropium-albuterol  3 mL Nebulization Q6H  . [START ON 05/22/2016] levofloxacin  750 mg Oral Daily  . loratadine  10 mg Oral Daily  . mouth rinse  15 mL Mouth Rinse q12n4p  . methylPREDNISolone (SOLU-MEDROL) injection  40 mg Intravenous Q6H  . sodium chloride flush  3 mL Intravenous Q12H   Continuous Infusions:    LOS: 3 days    Time spent: 30 minutes.    Jazia Faraci Tanna Furry, MD [facility] Pager [phone]  If 7PM-7AM, please contact night-coverage  [email or web address] Password [facility] 05/21/2016, 12:36 PM

## 2016-05-21 NOTE — Progress Notes (Signed)
Occupational Therapy Treatment Patient Details Name: Heather Banks MRN: [id number] DOB: [birth year] Today's Date: 05/21/2016    History of present illness 65 year old female history of chronic respiratory failure secondary to COPD and stage IV lung cancer on 3 L nasal cannula at home presented with acute on chronic respiratory failure with hypoxia and hypercarbia secondary to COPD exacerbation.   OT comments  Pt making progress with adls and functional mobility. Pt continues to have difficulty keeping O2 sats up with activity.  At rest pt was 95% on 6 L. During adls pt dropped to 62% on 6L.  Pt given handout re: energy conservation at home and is making progress with all goals.  Follow Up Recommendations  Home health OT    Equipment Recommendations  3 in 1 bedside comode    Recommendations for Other Services      Precautions / Restrictions Precautions Precautions: Fall Precaution Comments: monitor O2 sats Restrictions Weight Bearing Restrictions: No       Mobility Bed Mobility Overal bed mobility: Needs Assistance Bed Mobility: Supine to Sit     Supine to sit: Supervision     General bed mobility comments: Pt did not need physical assist but HOB at 50 degrees. Pt states she props up on pillows at home and never lays flat.  Transfers Overall transfer level: Needs assistance Equipment used: 1 person hand held assist Transfers: Sit to/from Stand Sit to Stand: Min guard Stand pivot transfers: Min guard       General transfer comment: assist with lines    Balance Overall balance assessment: Needs assistance Sitting-balance support: Feet supported Sitting balance-Leahy Scale: Good     Standing balance support: During functional activity Standing balance-Leahy Scale: Fair Standing balance comment: Pt able to maintain balance without an assistive device for very short amounts of time during adls but fatigues so quickly.  Feel pt should be using an assistive device but  in the room declines using one.                   ADL Overall ADL's : Needs assistance/impaired Eating/Feeding: Independent;Sitting   Grooming: Oral care;Standing;Min guard Grooming Details (indicate cue type and reason): pt fatigues very quickly with any activity.  Only tolerated brushing teeth in standing before needing to sit.  Pt may need to groom in sitting at home.           Upper Body Dressing : Set up;Sitting   Lower Body Dressing: Minimal assistance;Sit to/from stand;Cueing for compensatory techniques Lower Body Dressing Details (indicate cue type and reason): Pt dressed LE only requiring assist for R sock. Pt can cross L leg over to donn sock and shoes but cannot do this with the L. Pt has a sock aid at home and a reacher to assist with LE dressing.  Pt leaves all equipment in the bathoom and dresses on the toilet. Toilet Transfer: Min Art therapist Details (indicate cue type and reason): pt transferred with min guard and no assistive device. Pt with multiple lines warrenting min guard. Toileting- Clothing Manipulation and Hygiene: Minimal assistance;Sit to/from stand;Cueing for safety;Cueing for sequencing     Tub/Shower Transfer Details (indicate cue type and reason): spoke with pt at length about the shower.  Pt has a shower at home but sponge bathes. She states she is fearful of falling and really just does not want to get into the shower so will stick with sponge bathing on the toilet. Functional mobility during ADLs: Min guard General ADL Comments:  Pt can complete adls well but just gets very SOB and O2 sats drop with activity.  Pt was 95% on 6L of O2 with no activity and 89% during adls.  Pt given an energy conservation handout and this was reviewed with pt for home use.      Vision                     Perception     Praxis      Cognition   Behavior During Therapy: WFL for tasks assessed/performed Overall Cognitive Status: Within  Functional Limits for tasks assessed                       Extremity/Trunk Assessment               Exercises     Shoulder Instructions       General Comments      Pertinent Vitals/ Pain       Pain Assessment: No/denies pain  Home Living                                          Prior Functioning/Environment              Frequency  Min 2X/week        Progress Toward Goals  OT Goals(current goals can now be found in the care plan section)  Progress towards OT goals: Progressing toward goals  Acute Rehab OT Goals Patient Stated Goal: get well OT Goal Formulation: With patient Time For Goal Achievement: 05/26/16 Potential to Achieve Goals: Good ADL Goals Pt Will Perform Upper Body Dressing: with set-up;sitting Pt Will Perform Lower Body Dressing: with set-up;sit to/from stand;with adaptive equipment;with caregiver independent in assisting Pt Will Transfer to Toilet: with set-up;regular height toilet;ambulating Pt Will Perform Toileting - Clothing Manipulation and hygiene: sit to/from stand Additional ADL Goal #1: Pt will incorporate deep breathing and energy conservation strategies during ADL activity at mod I level  Plan Discharge plan remains appropriate    Co-evaluation                 End of Session Equipment Utilized During Treatment: Oxygen   Activity Tolerance Patient limited by fatigue   Patient Left in chair   Nurse Communication Mobility status        Time: 1791-5056 OT Time Calculation (min): 18 min  Charges: OT General Charges $OT Visit: 1 Procedure OT Treatments $Self Care/Home Management : 8-22 mins  Glenford Peers 05/21/2016, 10:05 AM  [phone]

## 2016-05-21 NOTE — Telephone Encounter (Signed)
Pl see my note

## 2016-05-21 NOTE — Telephone Encounter (Signed)
RA please advise on refills. thanks

## 2016-05-21 NOTE — Telephone Encounter (Signed)
Pharmacy calling on behalf of patient who is requesting refills on diazepam and tramadol. Last refills: Diazepam- '2mg'$  #30 with 0 refills on 04/14/16, take 1 tab q6h prn. Tramadol- '50mg'$  #30 with 0 refills on 04/14/16, take 1 po q12h prn.   Last OV- 04/14/16 Next OV- 07/14/16  RA please advise.  Thanks!

## 2016-05-21 NOTE — Telephone Encounter (Signed)
Defer to PCP

## 2016-05-21 NOTE — Progress Notes (Signed)
Date:  May 21, 2016 Chart reviewed for concurrent status and case management needs. Will continue to follow the patient for status change: remains on hfnc 02. Discharge Planning: following for needs Expected discharge date: [id number] Velva Harman, BSN, [place], [place]

## 2016-05-21 NOTE — Progress Notes (Signed)
PHARMACIST - PHYSICIAN COMMUNICATION DR:   Carolin Sicks CONCERNING: Antibiotic IV to Oral Route Change Policy  RECOMMENDATION: This patient is receiving Levaquin by the intravenous route.  Based on criteria approved by the Pharmacy and Therapeutics Committee, the antibiotic(s) is/are being converted to the equivalent oral dose form(s).   DESCRIPTION: These criteria include:  Patient being treated for a respiratory tract infection, urinary tract infection, cellulitis or clostridium difficile associated diarrhea if on metronidazole  The patient is not neutropenic and does not exhibit a GI malabsorption state  The patient is eating (either orally or via tube) and/or has been taking other orally administered medications for a least 24 hours  The patient is improving clinically and has a Tmax < 100.5  If you have questions about this conversion, please contact the Pharmacy Department  '[]'$   [phone] )  [facility] '[]'$   [phone] )  [facility] '[]'$   [phone] )  [name] '[]'$   [phone] )  [facility] '[x]'$   [phone] )  [place], PharmD, BCPS Pager: [phone] 05/21/2016 11:17 AM

## 2016-05-21 NOTE — Progress Notes (Signed)
Pt refused CPAP - QHS.  Pt prefers to stay on HFNC tonight while sleeping.  Education provided.  RT will continue to monitor as needed.

## 2016-05-22 DIAGNOSIS — R030 Elevated blood-pressure reading, without diagnosis of hypertension: Secondary | ICD-10-CM

## 2016-05-22 LAB — GLUCOSE, CAPILLARY
GLUCOSE-CAPILLARY: 129 mg/dL — AB (ref 65–99)
GLUCOSE-CAPILLARY: 135 mg/dL — AB (ref 65–99)
GLUCOSE-CAPILLARY: 137 mg/dL — AB (ref 65–99)
GLUCOSE-CAPILLARY: 187 mg/dL — AB (ref 65–99)
Glucose-Capillary: 132 mg/dL — ABNORMAL HIGH (ref 65–99)

## 2016-05-22 MED ORDER — IPRATROPIUM-ALBUTEROL 0.5-2.5 (3) MG/3ML IN SOLN
3.0000 mL | Freq: Four times a day (QID) | RESPIRATORY_TRACT | Status: DC
  Administered 2016-05-23 – 2016-05-24 (×5): 3 mL via RESPIRATORY_TRACT
  Filled 2016-05-22 (×5): qty 3

## 2016-05-22 MED ORDER — AMLODIPINE BESYLATE 5 MG PO TABS
5.0000 mg | ORAL_TABLET | Freq: Every day | ORAL | Status: DC
  Administered 2016-05-22 – 2016-05-24 (×3): 5 mg via ORAL
  Filled 2016-05-22 (×3): qty 1

## 2016-05-22 MED ORDER — BOOST / RESOURCE BREEZE PO LIQD
1.0000 | Freq: Three times a day (TID) | ORAL | Status: DC
  Administered 2016-05-22 – 2016-05-24 (×5): 1 via ORAL

## 2016-05-22 MED ORDER — PREDNISONE 50 MG PO TABS
50.0000 mg | ORAL_TABLET | Freq: Every day | ORAL | Status: DC
  Administered 2016-05-23 – 2016-05-24 (×2): 50 mg via ORAL
  Filled 2016-05-22: qty 2
  Filled 2016-05-22: qty 1

## 2016-05-22 NOTE — Telephone Encounter (Signed)
Spoke with pharmacist, aware to defer to PCP.  Nothing further needed.

## 2016-05-22 NOTE — Progress Notes (Signed)
PROGRESS NOTE    Heather Banks  [id number]:[id number] DOB: Oct 09, 1950 DOA: 05/17/2016 PCP: [name], NP    Brief Narrative: 65 year old female with history of COPD, stage IV lung cancer status post chemotherapy presented with acute on chronic respiratory failure with hypoxia and hypercarbia secondary to COPD exacerbation. Patient was on the BiPAP and now on high flow nasal cannula. Palliative care and pulmonary have assessed the patient.  Assessment & Plan:   # Acute on chronic respiratory failure ([place]) due to acute COPD exacerbation: Patient is off BiPAP and currently on high flow oxygen, requiring about 6 L of oxygen.  -Try to wean oxygen gradually. The patient denied using oxygen at home. I discussed this with the patient's nurse at bedside. -Discontinued Solu-Medrol and changed to oral prednisone. -Continue Levaquin -Continue bronchodilators and breathing treatment -Pulmonary consult evaluated the patient.  #Elevated blood pressure: Patient does not take blood pressure medicine at home. Unknown if high blood pressure is contributed by her anxiety and Solu-Medrol. Continue to monitor blood pressure. Added low-dose Norvasc today.  #Hyperglycemia likely in the setting of a steroid: Monitor blood sugar level and cover with sliding scale.  #Abnormal TSH level/euthyroid sick syndrome: Low TSH level with normal free T3 and T4 level. Recommended outpatient follow-up to monitor her thyroid function test.   #History of stage IV non-small cell lung cancer status post chemotherapy and radiation therapy as per patient: Advised outpatient follow-up with her oncologist.   # Encounter for palliative care: Evaluated by palliative care. Patient is currently partial DO NOT RESUSCITATE. Discussed with the palliative care team today.    DVT prophylaxis: Lovenox Code Status: Partial DO NOT RESUSCITATE Family Communication: No family present at bedside Disposition Plan: Likely discharge to home with  home care in 1-2 days   Consultants:   Pulmonology and palliative care  Procedures: Off BiPAP now Antimicrobials: Levaquin  Subjective: Patient was seen and examined at bedside. Patient reported feeling better. Denied cough, shortness of breath, chest pain, nausea or vomiting. Trying to wean oxygen gradually.   Objective: Vitals:   05/22/16 0400 05/22/16 0500 05/22/16 0600 05/22/16 0800  BP: 129/73  (!) 151/80 (!) 174/92  Pulse:      Resp: (!) 22 20 (!) 21 20  Temp: 98.8 F (37.1 C)   97.7 F (36.5 C)  TempSrc: Axillary   Oral  SpO2: 94% 95% 98% 97%  Weight:      Height:        Intake/Output Summary (Last 24 hours) at 05/22/16 1159 Last data filed at 05/22/16 0600  Gross per 24 hour  Intake              250 ml  Output             1750 ml  Net            -1500 ml   Filed Weights   05/18/16 0919  Weight: 88 kg (194 lb)    Examination:  General exam: Appear comfortable and not in distress. Waiting 6 L of oxygen via nasal cannula. Speaking full sentences. Respiratory system: Clear to auscultation bilateral, no wheezing appreciated today. Cardiovascular system: Regular rate rhythm, S1-S2 normal. No pedal edema noticed. Gastrointestinal system: Abdomen soft, nontender and nondistended. Normal bowel sound.. Central nervous system: Alert and oriented. No focal neurological deficits. Extremities: Symmetric 5 x 5 power. Skin: No rashes, lesions or ulcers Psychiatry: Judgement and insight appear normal. Mood & affect appropriate.     Data Reviewed: I have personally  reviewed following labs and imaging studies  CBC:  Recent Labs Lab 05/17/16 2235 05/18/16 0338 05/19/16 0322 05/20/16 0330 05/21/16 0325  WBC 12.6* 9.6 15.5* 13.0* 8.6  NEUTROABS 10.9*  --   --   --   --   HGB 13.8 13.1 12.3 12.5 12.6  HCT 42.4 42.4 39.5 40.3 40.9  MCV 96.1 98.6 99.7 99.0 98.8  PLT 359 332 322 319 315   Basic Metabolic Panel:  Recent Labs Lab 05/17/16 2235 05/18/16 0338  05/19/16 0322 05/20/16 0330 05/21/16 0325  NA 141 140 139 139 136  K 4.1 4.0 4.8 4.3 4.4  CL 108 106 104 101 98*  CO2 '23 22 26 29 '$ 33*  GLUCOSE 190* 233* 136* 149* 137*  BUN '14 15 16 13 16  '$ CREATININE 0.57 0.60 0.50 0.43* 0.57  CALCIUM 8.9 8.9 9.1 9.2 9.0  MG  --  2.4 2.0  --   --   PHOS  --  2.2*  --   --   --    GFR: Estimated Creatinine Clearance: 78.9 mL/min (by C-G formula based on SCr of 0.57 mg/dL). Liver Function Tests:  Recent Labs Lab 05/18/16 0338  AST 19  ALT 12*  ALKPHOS 97  BILITOT 0.5  PROT 7.4  ALBUMIN 3.9   No results for input(s): LIPASE, AMYLASE in the last 168 hours. No results for input(s): AMMONIA in the last 168 hours. Coagulation Profile: No results for input(s): INR, PROTIME in the last 168 hours. Cardiac Enzymes: No results for input(s): CKTOTAL, CKMB, CKMBINDEX, TROPONINI in the last 168 hours. BNP (last 3 results) No results for input(s): PROBNP in the last 8760 hours. HbA1C: No results for input(s): HGBA1C in the last 72 hours. CBG:  Recent Labs Lab 05/21/16 1203 05/21/16 1740 05/22/16 0032 05/22/16 0516 05/22/16 1111  GLUCAP 163* 138* 129* 135* 137*   Lipid Profile: No results for input(s): CHOL, HDL, LDLCALC, TRIG, CHOLHDL, LDLDIRECT in the last 72 hours. Thyroid Function Tests: No results for input(s): TSH, T4TOTAL, FREET4, T3FREE, THYROIDAB in the last 72 hours. Anemia Panel: No results for input(s): VITAMINB12, FOLATE, FERRITIN, TIBC, IRON, RETICCTPCT in the last 72 hours. Sepsis Labs: No results for input(s): PROCALCITON, LATICACIDVEN in the last 168 hours.  Recent Results (from the past 240 hour(s))  MRSA PCR Screening     Status: None   Collection Time: 05/18/16  3:32 AM  Result Value Ref Range Status   MRSA by PCR NEGATIVE NEGATIVE Final    Comment:        The GeneXpert MRSA Assay (FDA approved for NASAL specimens only), is one component of a comprehensive MRSA colonization surveillance program. It is  not intended to diagnose MRSA infection nor to guide or monitor treatment for MRSA infections.          Radiology Studies: No results found.      Scheduled Meds: . amLODipine  5 mg Oral Daily  . chlorhexidine  15 mL Mouth Rinse BID  . enoxaparin (LOVENOX) injection  40 mg Subcutaneous Q24H  . famotidine  20 mg Oral Daily  . feeding supplement  1 Container Oral TID BM  . fluticasone  2 spray Each Nare Daily  . guaiFENesin  1,200 mg Oral BID  . insulin aspart  0-9 Units Subcutaneous Q6H  . ipratropium-albuterol  3 mL Nebulization Q6H  . levofloxacin  750 mg Oral Daily  . loratadine  10 mg Oral Daily  . mouth rinse  15 mL Mouth Rinse q12n4p  . [  START ON 05/23/2016] predniSONE  50 mg Oral Q breakfast  . sodium chloride flush  3 mL Intravenous Q12H   Continuous Infusions:    LOS: 4 days    Time spent: 25 minutes.    Korayma Hagwood Tanna Furry, MD [facility] Pager [phone]  If 7PM-7AM, please contact night-coverage [email or web address] Password [email or web address] 05/22/2016, 11:59 AM

## 2016-05-22 NOTE — Progress Notes (Signed)
Physical Therapy Treatment Patient Details Name: Heather Banks MRN: [id number] DOB: 07/28/1951 Today's Date: 05/22/2016    History of Present Illness 65 year old female history of chronic respiratory failure secondary to COPD and stage IV lung cancer on 3 L nasal cannula at home presented with acute on chronic respiratory failure with hypoxia and hypercarbia secondary to COPD exacerbation.    PT Comments    The patient ambulated x 150' today on 6 liters oxygen( recommended by RN)  Follow Up Recommendations  Home health PT;Supervision/Assistance - 24 hour     Equipment Recommendations  Rolling walker with 5" wheels    Recommendations for Other Services       Precautions / Restrictions Precautions Precautions: Fall Precaution Comments: monitor O2 sats Restrictions Weight Bearing Restrictions: No    Mobility  Bed Mobility               General bed mobility comments: up in recliner  Transfers Overall transfer level: Needs assistance Equipment used: Rolling walker (2 wheeled) Transfers: Sit to/from Stand Sit to Stand: Supervision            Ambulation/Gait Ambulation/Gait assistance: Min guard Ambulation Distance (Feet): 150 Feet Assistive device: Rolling walker (2 wheeled) Gait Pattern/deviations: Step-through pattern     General Gait Details: ambulated with 6L O2 HFNC, SaO2 94% walking, HR 117 max walking, with RW. Dyspnea 3-4/4   Stairs            Wheelchair Mobility    Modified Rankin (Stroke Patients Only)       Balance                                    Cognition Arousal/Alertness: Awake/alert                          Exercises      General Comments        Pertinent Vitals/Pain Pain Score: 7  Pain Location: back Pain Descriptors / Indicators: Aching Pain Intervention(s): Premedicated before session;Monitored during session    Home Living                      Prior Function             PT Goals (current goals can now be found in the care plan section) Progress towards PT goals: Progressing toward goals    Frequency    Min 3X/week      PT Plan Current plan remains appropriate    Co-evaluation             End of Session Equipment Utilized During Treatment: Oxygen Activity Tolerance: Patient limited by fatigue Patient left: in chair;with call bell/phone within reach;with chair alarm set     Time: 1345-1410 PT Time Calculation (min) (ACUTE ONLY): 25 min  Charges:  $Gait Training: 23-37 mins                    G Codes:      Claretha Cooper 05/22/2016, 4:17 PM Tresa Endo PT [phone]

## 2016-05-22 NOTE — Telephone Encounter (Signed)
Please see my response to-earlier refill request Defer to PCP

## 2016-05-22 NOTE — Progress Notes (Signed)
Nutrition Follow-up  DOCUMENTATION CODES:   Obesity unspecified  INTERVENTION:  - Continue Boost Breeze, but will increase to TID.  - Continue to encourage PO intakes of meals and supplements. - RD will continue to monitor for nutrition-related needs.  NUTRITION DIAGNOSIS:   Increased nutrient needs related to chronic illness as evidenced by estimated needs. -revised.  GOAL:   Patient will meet greater than or equal to 90% of their needs -variably met.  MONITOR:   PO intake, Supplement acceptance, Weight trends, Labs, Skin, I & O's  ASSESSMENT:   65 y.o. female with medical history significant of COPD on 3 L at baseline and adenocarcinoma of the lungs. Presented with shortness of breath associated with cough and wheezing at past 2 days. No associated nausea vomiting or diarrhea she attempted to use home nebulizers. Patient was states she does not wish to be intubated.  10/26 Diet advancement as follows:  10/22 @ 2423: NPO 10/22 @ 1125: CLD 10/24 @ 1026: Regular  Per chart review, pt consumed 100% of lunch on 10/24 and no other intakes documented since that time. Daughter is at bedside and reports that she was present for breakfast this AM which consisted of Frosted Flakes, scrambled eggs, fruit cup, and coffee. Pt has been drinking wild berry flavor of Boost Breeze. Talked with her about alternative oral nutrition supplements but pt would prefer to continue Boost Breeze at this time.  Daughter reports that she (daughter) is health-conscious d/t medical needs and consumes a high protein diet of foods with high bioavailability to protein. Due to chronic/catabolic illness, encouraged pt to consume Boost Breeze TID and at least 1 serving of protein-containing food at each meal. Also encouraged snacks to consist of protein foods.   Daughter plans to purchase oral nutrition supplements for home use and continue to provide these TID to pt. Pt denies chewing or swallowing issues with  foods ordered. She was having intermittent coughing during discussion and states that this has been an on/off occurrence. Daughter reports that O2 was weaned down ~1 hour prior to RD visit and ~30 minutes prior to pt consuming breakfast. Pt states that nostrils have been very dry; she denies dryness to mouth or throat and states she sips liquids throughout the day.   No new weight since admission. Per MD note yesterday AM, possible d/c home in 1-2 days from that time.  Medications reviewed; 20 mg oral Pepcid/day, Mucinex BID, sliding scale Novolog, 40 mg IV Prednisone BID, PRN Zofran. Labs reviewed; CBGs: 129 and 135 mg/dL, Cl: 98 mmol/L.     10/23 - No intakes documented since admission.  - Lunch tray was being delivered as RD left the room.  - Pt reports that for breakfast she had jello, coffee, and chicken broth.  - She denies abdominal pain or nausea associated with PO intakes.  - She does not feel that she became SOB/increasingly SOB while consuming liquids.  - She states that when she is alone she is more conscious of breathing and that daughter was present during breakfast meal.  - Pt reports good appetite PTA and that she was able to fix her own meals or drive to purchase food at places such as [employer].  - Pt with no upper teeth and only 3 bottom teeth.  - She states that bottom partial no longer fits as the tooth it connected to fell out after radiation.  - She does not wear upper denture to eat as it causes her to "gag."  -  Pt states that she is able to eat hamburgers and very tender poultry.  - Physical assessment shows no muscle or fat wasting to upper body; lower body not assessed at this time.  - Per chart review, weight has been stable (192-196 lbs) x4 months.   Diet Order:  Diet regular Room service appropriate? Yes; Fluid consistency: Thin  Skin:  Wound (see comment) (Mid upper back wound)  Last BM:  10/25  Height:   Ht Readings from Last 1 Encounters:  05/18/16  5' 5"  (1.651 m)    Weight:   Wt Readings from Last 1 Encounters:  05/18/16 194 lb (88 kg)    Ideal Body Weight:  56.82 kg  BMI:  Body mass index is 32.28 kg/m.  Estimated Nutritional Needs:   Kcal:  1935-2112 (22-24 kcal/kg)  Protein:  107-123 grams (1.2-1.4 grams/kg)  Fluid:  >/= 1.8 L/day  EDUCATION NEEDS:   No education needs identified at this time    Jarome Matin, MS, RD, LDN Inpatient Clinical Dietitian Pager # [phone] After hours/weekend pager # [phone]

## 2016-05-22 NOTE — Progress Notes (Signed)
Patient continues torefuse nocturnal CPAP. RT will continue to follow.

## 2016-05-23 LAB — GLUCOSE, CAPILLARY
GLUCOSE-CAPILLARY: 94 mg/dL (ref 65–99)
GLUCOSE-CAPILLARY: 117 mg/dL — AB (ref 65–99)
Glucose-Capillary: 208 mg/dL — ABNORMAL HIGH (ref 65–99)

## 2016-05-23 NOTE — Progress Notes (Signed)
PROGRESS NOTE    Heather Banks  CWC:[id number] DOB: 24-Nov-1950 DOA: 05/17/2016 PCP: [name], NP    Brief Narrative: 65 year old female with history of COPD, stage IV lung cancer status post chemotherapy presented with acute on chronic respiratory failure with hypoxia and hypercarbia secondary to COPD exacerbation. Patient was on the BiPAP and now on high flow nasal cannula. Palliative care and pulmonary have assessed the patient.  Assessment & Plan:   # Acute on chronic respiratory failure ([place]) due to acute COPD exacerbation: Patient is off BiPAP. Oxygen weaned to 2 L via nasal cannula this time. Discussed with the patient and her nurse at bedside. Patient is gradually improving. We will continue oral prednisone, Levaquin and supportive care. I will transfer patient to medical floor today. Patient has been refusing BiPAP. -Continue bronchodilators and breathing treatment -Pulmonary consult evaluated the patient.  #Elevated blood pressure, likely hypertension: Patient does not take blood pressure medicine at home. Unknown if high blood pressure is contributed by her anxiety and Solu-Medrol. On Norvasc. Monitor blood pressure.  #Hyperglycemia likely in the setting of a steroid: Monitor blood sugar level and cover with sliding scale. Blood sugar controlled.  #Abnormal TSH level/euthyroid sick syndrome: Low TSH level with normal free T3 and T4 level. Recommended outpatient follow-up to monitor her thyroid function test.   #History of stage IV non-small cell lung cancer status post chemotherapy and radiation therapy as per patient: Advised outpatient follow-up with her oncologist.   # Encounter for palliative care: Evaluated by palliative care. Patient is currently partial DO NOT RESUSCITATE. Discussed with the palliative care team today.    DVT prophylaxis: Lovenox Code Status: Partial DO NOT RESUSCITATE Family Communication: No family present at bedside Disposition Plan: Likely  discharge to home with home care in 1-2 days   Consultants:   Pulmonology and palliative care  Procedures: Off BiPAP now Antimicrobials: Levaquin  Subjective: Patient was seen and examined at bedside. Patient reported feeling better. Has minimal shortness of breath and dry cough. Denied chest pain, chills, nausea, vomiting.   Objective: Vitals:   05/23/16 0838 05/23/16 0900 05/23/16 1000 05/23/16 1215  BP:  (!) 145/72 (!) 130/93 127/80  Pulse:    96  Resp:  17 (!) 22 18  Temp:    97.5 F (36.4 C)  TempSrc:    Oral  SpO2: 91% 90% 93% 96%  Weight:      Height:        Intake/Output Summary (Last 24 hours) at 05/23/16 1302 Last data filed at 05/23/16 0900  Gross per 24 hour  Intake              240 ml  Output             1550 ml  Net            -1310 ml   Filed Weights   05/18/16 0919  Weight: 88 kg (194 lb)    Examination:  General exam: Not in distress, able to speak full sentences, on 2 L of oxygen via nasal cannula. Respiratory system: Intermittent expiratory wheeze, no crackle. Cardiovascular system: Regular rate rhythm, S1-S2 normal. No pedal edema. Gastrointestinal system: Bowel sound positive, abdomen soft, nontender and nondistended. Central nervous system: Alert, awake and oriented. No focal neurological deficit. Extremities: Symmetric 5 x 5 power. Skin: No rashes, lesions or ulcers Psychiatry: Judgement and insight appear normal. Mood & affect appropriate.     Data Reviewed: I have personally reviewed following labs and imaging studies  CBC:  Recent Labs Lab 05/17/16 2235 05/18/16 0338 05/19/16 0322 05/20/16 0330 05/21/16 0325  WBC 12.6* 9.6 15.5* 13.0* 8.6  NEUTROABS 10.9*  --   --   --   --   HGB 13.8 13.1 12.3 12.5 12.6  HCT 42.4 42.4 39.5 40.3 40.9  MCV 96.1 98.6 99.7 99.0 98.8  PLT 359 332 322 319 161   Basic Metabolic Panel:  Recent Labs Lab 05/17/16 2235 05/18/16 0338 05/19/16 0322 05/20/16 0330 05/21/16 0325  NA 141 140 139  139 136  K 4.1 4.0 4.8 4.3 4.4  CL 108 106 104 101 98*  CO2 '23 22 26 29 '$ 33*  GLUCOSE 190* 233* 136* 149* 137*  BUN '14 15 16 13 16  '$ CREATININE 0.57 0.60 0.50 0.43* 0.57  CALCIUM 8.9 8.9 9.1 9.2 9.0  MG  --  2.4 2.0  --   --   PHOS  --  2.2*  --   --   --    GFR: Estimated Creatinine Clearance: 78.9 mL/min (by C-G formula based on SCr of 0.57 mg/dL). Liver Function Tests:  Recent Labs Lab 05/18/16 0338  AST 19  ALT 12*  ALKPHOS 97  BILITOT 0.5  PROT 7.4  ALBUMIN 3.9   No results for input(s): LIPASE, AMYLASE in the last 168 hours. No results for input(s): AMMONIA in the last 168 hours. Coagulation Profile: No results for input(s): INR, PROTIME in the last 168 hours. Cardiac Enzymes: No results for input(s): CKTOTAL, CKMB, CKMBINDEX, TROPONINI in the last 168 hours. BNP (last 3 results) No results for input(s): PROBNP in the last 8760 hours. HbA1C: No results for input(s): HGBA1C in the last 72 hours. CBG:  Recent Labs Lab 05/22/16 1111 05/22/16 1804 05/22/16 2340 05/23/16 0537 05/23/16 1232  GLUCAP 137* 132* 187* 94 117*   Lipid Profile: No results for input(s): CHOL, HDL, LDLCALC, TRIG, CHOLHDL, LDLDIRECT in the last 72 hours. Thyroid Function Tests: No results for input(s): TSH, T4TOTAL, FREET4, T3FREE, THYROIDAB in the last 72 hours. Anemia Panel: No results for input(s): VITAMINB12, FOLATE, FERRITIN, TIBC, IRON, RETICCTPCT in the last 72 hours. Sepsis Labs: No results for input(s): PROCALCITON, LATICACIDVEN in the last 168 hours.  Recent Results (from the past 240 hour(s))  MRSA PCR Screening     Status: None   Collection Time: 05/18/16  3:32 AM  Result Value Ref Range Status   MRSA by PCR NEGATIVE NEGATIVE Final    Comment:        The GeneXpert MRSA Assay (FDA approved for NASAL specimens only), is one component of a comprehensive MRSA colonization surveillance program. It is not intended to diagnose MRSA infection nor to guide or monitor  treatment for MRSA infections.          Radiology Studies: No results found.      Scheduled Meds: . amLODipine  5 mg Oral Daily  . chlorhexidine  15 mL Mouth Rinse BID  . enoxaparin (LOVENOX) injection  40 mg Subcutaneous Q24H  . famotidine  20 mg Oral Daily  . feeding supplement  1 Container Oral TID BM  . fluticasone  2 spray Each Nare Daily  . guaiFENesin  1,200 mg Oral BID  . insulin aspart  0-9 Units Subcutaneous Q6H  . ipratropium-albuterol  3 mL Nebulization QID  . levofloxacin  750 mg Oral Daily  . loratadine  10 mg Oral Daily  . mouth rinse  15 mL Mouth Rinse q12n4p  . predniSONE  50 mg Oral Q breakfast  .  sodium chloride flush  3 mL Intravenous Q12H   Continuous Infusions:    LOS: 5 days    Time spent: 25 minutes.    Odetta Forness Tanna Furry, MD [facility] Pager [phone]  If 7PM-7AM, please contact night-coverage [email or web address] Password [email or web address] 05/23/2016, 1:02 PM

## 2016-05-23 NOTE — Progress Notes (Signed)
Patient stated that she will let the nurse know if she wants to wear her CPAP tonight. RT will continue to monitor.

## 2016-05-23 NOTE — Progress Notes (Signed)
Physical Therapy Treatment Patient Details Name: Heather Banks MRN: [id number] DOB: 1950/10/23 Today's Date: 06/04/16    History of Present Illness 65 year old female history of chronic respiratory failure secondary to COPD and stage IV lung cancer on 3 L nasal cannula at home presented with acute on chronic respiratory failure with hypoxia and hypercarbia secondary to COPD exacerbation.    PT Comments    Pt ambulated in hallway and was assisted back to chair. Pt tolerated increased gait distance well and reports eagerness to continue working with acute PT to regain strength and endurance.   Follow Up Recommendations  Home health PT;Supervision for mobility/OOB     Equipment Recommendations  Rolling walker with 5" wheels    Recommendations for Other Services       Precautions / Restrictions Precautions Precautions: Fall Restrictions Weight Bearing Restrictions: No    Mobility  Bed Mobility               General bed mobility comments: pt in chair upon arrival  Transfers Overall transfer level: Needs assistance Equipment used: Rolling walker (2 wheeled) Transfers: Sit to/from Stand Sit to Stand: Supervision         General transfer comment: supervision for safety; no verbal cues required for safe technique  Ambulation/Gait Ambulation/Gait assistance: Min guard Ambulation Distance (Feet): 250 Feet Assistive device: Rolling walker (2 wheeled) Gait Pattern/deviations: Step-through pattern;Decreased stride length     General Gait Details: guarding for safety; pt reported no SOB, maintained pursed lip breathing during entirety of gait; no LOB episodes, however pt reported feeling as though she was leaning slightly to the left   Stairs            Wheelchair Mobility    Modified Rankin (Stroke Patients Only)       Balance                                    Cognition Arousal/Alertness: Awake/alert Behavior During Therapy: WFL for  tasks assessed/performed Overall Cognitive Status: Within Functional Limits for tasks assessed                      Exercises      General Comments        Pertinent Vitals/Pain Pain Assessment: 0-10 Pain Score: 7  Pain Location: mid back Pain Descriptors / Indicators: Aching Pain Intervention(s): Limited activity within patient's tolerance;Monitored during session;Patient requesting pain meds-RN notified    Home Living                      Prior Function            PT Goals (current goals can now be found in the care plan section) Progress towards PT goals: Progressing toward goals    Frequency    Min 3X/week      PT Plan Current plan remains appropriate    Co-evaluation             End of Session Equipment Utilized During Treatment: Oxygen Activity Tolerance: Patient tolerated treatment well Patient left: in chair;with call bell/phone within reach     Time: 1440-1456 PT Time Calculation (min) (ACUTE ONLY): 16 min  Charges:  $Gait Training: 8-22 mins                    G Codes:      Dewitt Hoes 04-Jun-2016, 3:10 PM Apolonio Schneiders  Haedyn Breau, SPT

## 2016-05-24 LAB — GLUCOSE, CAPILLARY
GLUCOSE-CAPILLARY: 126 mg/dL — AB (ref 65–99)
Glucose-Capillary: 83 mg/dL (ref 65–99)
Glucose-Capillary: 97 mg/dL (ref 65–99)

## 2016-05-24 MED ORDER — FAMOTIDINE 20 MG PO TABS: 20.0000 mg | ORAL_TABLET | Freq: Every day | ORAL | 0 refills | Status: DC

## 2016-05-24 MED ORDER — PREDNISONE 10 MG PO TABS: 10.0000 mg | ORAL_TABLET | Freq: Every day | ORAL | 0 refills | Status: DC

## 2016-05-24 MED ORDER — LORATADINE 10 MG PO TABS: 10.0000 mg | ORAL_TABLET | Freq: Every day | ORAL | 0 refills | Status: DC

## 2016-05-24 MED ORDER — AMLODIPINE BESYLATE 5 MG PO TABS: 5.0000 mg | ORAL_TABLET | Freq: Every day | ORAL | 0 refills | Status: DC

## 2016-05-24 NOTE — Discharge Instructions (Signed)
Please check Thyroid function test (lab) with your PCP in 4-6 weeks.

## 2016-05-24 NOTE — Discharge Summary (Addendum)
Physician Discharge Summary  Heather Banks YWV:[id number] DOB: 06-Apr-1951 DOA: 05/17/2016  PCP: [name], NP  Admit date: 05/17/2016 Discharge date: 05/24/2016  Admitted From:Home Disposition: Home  Recommendations for Outpatient Follow-up:  1. Follow up with PCP in 1-2 weeks 2. Please obtain BMP/CBC in one week   Home Health:Yes Equipment/Devices: No Discharge Condition: Stable CODE STATUS: Partial DO NOT RESUSCITATE Diet recommendation: Heart healthy  Brief/Interim Summary:65 year old female with history of COPD, stage IV lung cancer status post chemotherapy presented with acute on chronic respiratory failure with hypoxia and hypercarbia secondary to COPD exacerbation. Patient was initially treated with BiPAP and was evaluated by pulmonologist.  # Acute on chronic respiratory failure ([place]) due to acute COPD exacerbation: Patient is off BiPAP.  -Patient clinically improved. Oxygen saturation around 94% in room air and while walking. I walked with the patient in the hallway today. Patient significantly improved. Treated with Levaquin, completed the course. We will discharge with the prednisone with tapering dose. I advised patient to follow-up with her PCP and pulmonologist for further evaluation.  #Elevated blood pressure, likely hypertension: Started on Norvasc. Patient will follow-up with PCP outpatient.  #Hyperglycemia likely in the setting of a steroid: Blood sugar better controlled. Advised outpatient follow-up with PCP.  #Abnormal TSH level/euthyroid sick syndrome: Low TSH level with normal free T3 and T4 level. Recommended outpatient follow-up to monitor her thyroid function test.   #History of stage IV non-small cell lung cancer status post chemotherapy and radiation therapy as per patient: Advised outpatient follow-up with her oncologist.   # Encounter for palliative care: Evaluated by palliative care. Patient is currently partial DO NOT RESUSCITATE.   Since  patient clinically improved and saturating good in room air. Denied chest pain, shortness of breath or coughing. Patient is discharged home with home care in stable condition.  Discharge Diagnoses:  Principal Problem:   Acute on chronic respiratory failure (HCC) Active Problems:   Primary cancer of left upper lobe of lung (HCC)   Respiratory failure (HCC)   History of pulmonary embolus (PE)   COPD with acute exacerbation (HCC)   Hyperglycemia   Abnormal TSH   Dyspnea   Encounter for palliative care   COPD exacerbation (HCC)   Elevated blood pressure reading    Discharge Instructions  Discharge Instructions    Call MD for:  difficulty breathing, headache or visual disturbances    Complete by:  As directed    Call MD for:  extreme fatigue    Complete by:  As directed    Call MD for:  persistant nausea and vomiting    Complete by:  As directed    Call MD for:  temperature >100.4    Complete by:  As directed    Diet - low sodium heart healthy    Complete by:  As directed    Increase activity slowly    Complete by:  As directed        Medication List    STOP taking these medications   lidocaine-prilocaine cream Commonly known as:  EMLA     TAKE these medications   acidophilus Caps capsule Take 1 capsule by mouth daily.   albuterol (2.5 MG/3ML) 0.083% nebulizer solution Commonly known as:  PROVENTIL Take 3 mLs (2.5 mg total) by nebulization every 6 (six) hours as needed for wheezing or shortness of breath.   albuterol 108 (90 Base) MCG/ACT inhaler Commonly known as:  PROVENTIL HFA;VENTOLIN HFA Inhale 2 puffs into the lungs every 6 (six) hours as  needed for wheezing or shortness of breath.   ALPRAZolam 0.5 MG tablet Commonly known as:  XANAX Take 0.5 mg by mouth at bedtime as needed for sleep or anxiety.   amLODipine 5 MG tablet Commonly known as:  NORVASC Take 1 tablet (5 mg total) by mouth daily. Start taking on:  05/25/2016   ASPERCREME LIDOCAINE 4 %  Ptch Generic drug:  Lidocaine Apply 1 patch topically daily as needed. Place on back as needed for pain   aspirin-acetaminophen-caffeine 250-250-65 MG tablet Commonly known as:  EXCEDRIN MIGRAINE Take 2 tablets by mouth every 6 (six) hours as needed for headache.   calcium carbonate 1250 (500 Ca) MG tablet Commonly known as:  OS-CAL - dosed in mg of elemental calcium Take 1 tablet by mouth daily with breakfast.   cyanocobalamin 2000 MCG tablet Take 2,000 mcg by mouth daily.   diazepam 2 MG tablet Commonly known as:  VALIUM Take 1 tablet (2 mg total) by mouth every 6 (six) hours as needed for anxiety.   diphenhydrAMINE 25 mg capsule Commonly known as:  BENADRYL Take 25 mg by mouth every 6 (six) hours as needed for itching. Reported on 01/19/2016   famotidine 20 MG tablet Commonly known as:  PEPCID Take 1 tablet (20 mg total) by mouth daily.   hydrocortisone 1 % ointment Apply 1 application topically 2 (two) times daily. What changed:  when to take this  reasons to take this   loratadine 10 MG tablet Commonly known as:  CLARITIN Take 1 tablet (10 mg total) by mouth daily. Start taking on:  05/25/2016   mirtazapine 30 MG tablet Commonly known as:  REMERON Take 15 mg by mouth at bedtime as needed (for sleep).   predniSONE 10 MG tablet Commonly known as:  DELTASONE Take 1 tablet (10 mg total) by mouth daily with breakfast. Take 40 mg daily for 3 days, 30 mg daily for 3 days, 20 mg daily for 3 days, then  10 mg daily for 3 days and then stop.   traMADol 50 MG tablet Commonly known as:  ULTRAM Take 1 tablet (50 mg total) by mouth every 12 (twelve) hours as needed for moderate pain.   umeclidinium-vilanterol 62.5-25 MCG/INH Aepb Commonly known as:  ANORO ELLIPTA Inhale 1 puff into the lungs daily.      Follow-up Information    [name], NP. Schedule an appointment as soon as possible for a visit in 1 week(s).   Specialty:  Family Medicine Contact  information: [place] [address] [phone]          Allergies  Allergen Reactions  . Ativan [Lorazepam] Other (See Comments)    Reaction:  Makes pt angry - opposite reaction of med should do  . Cinnamon Other (See Comments)    Seasonal scent trigger migraine as well as pt's overall immune system    Consultations:Pulmonologist   Procedures/Studies: None   Subjective: Patient was seen and examined at bedside. I walked with the patient in the hallway. Her oxygen saturation acceptable. Denied chest pain, shortness of breath, coughing, nausea, vomiting, fever, chills.   Discharge Exam: Vitals:   05/23/16 2004 05/24/16 0553  BP: (!) 141/85 130/64  Pulse: 72 62  Resp: 18 16  Temp: 98.8 F (37.1 C) 98.6 F (37 C)   Vitals:   05/24/16 0553 05/24/16 0753 05/24/16 0915 05/24/16 0919  BP: 130/64     Pulse: 62     Resp: 16     Temp: 98.6  F (37 C)     TempSrc: Oral     SpO2: 99% 96% 95% 93%  Weight:      Height:        General: Pt is alert, awake, not in acute distress Cardiovascular: RRR, S1/S2 +, no rubs, no gallops Respiratory: CTA bilaterally, no wheezing, no rhonchi Abdominal: Soft, NT, ND, bowel sounds + Extremities: no edema, no cyanosis No focal neurological deficit. Ambulating well.   The results of significant diagnostics from this hospitalization (including imaging, microbiology, ancillary and laboratory) are listed below for reference.     Microbiology: Recent Results (from the past 240 hour(s))  MRSA PCR Screening     Status: None   Collection Time: 05/18/16  3:32 AM  Result Value Ref Range Status   MRSA by PCR NEGATIVE NEGATIVE Final    Comment:        The GeneXpert MRSA Assay (FDA approved for NASAL specimens only), is one component of a comprehensive MRSA colonization surveillance program. It is not intended to diagnose MRSA infection nor to guide or monitor treatment for MRSA infections.      Labs: BNP (last 3  results)  Recent Labs  01/18/16 1715  BNP 31.4   Basic Metabolic Panel:  Recent Labs Lab 05/17/16 2235 05/18/16 0338 05/19/16 0322 05/20/16 0330 05/21/16 0325  NA 141 140 139 139 136  K 4.1 4.0 4.8 4.3 4.4  CL 108 106 104 101 98*  CO2 '23 22 26 29 '$ 33*  GLUCOSE 190* 233* 136* 149* 137*  BUN '14 15 16 13 16  '$ CREATININE 0.57 0.60 0.50 0.43* 0.57  CALCIUM 8.9 8.9 9.1 9.2 9.0  MG  --  2.4 2.0  --   --   PHOS  --  2.2*  --   --   --    Liver Function Tests:  Recent Labs Lab 05/18/16 0338  AST 19  ALT 12*  ALKPHOS 97  BILITOT 0.5  PROT 7.4  ALBUMIN 3.9   No results for input(s): LIPASE, AMYLASE in the last 168 hours. No results for input(s): AMMONIA in the last 168 hours. CBC:  Recent Labs Lab 05/17/16 2235 05/18/16 0338 05/19/16 0322 05/20/16 0330 05/21/16 0325  WBC 12.6* 9.6 15.5* 13.0* 8.6  NEUTROABS 10.9*  --   --   --   --   HGB 13.8 13.1 12.3 12.5 12.6  HCT 42.4 42.4 39.5 40.3 40.9  MCV 96.1 98.6 99.7 99.0 98.8  PLT 359 332 322 319 289   Cardiac Enzymes: No results for input(s): CKTOTAL, CKMB, CKMBINDEX, TROPONINI in the last 168 hours. BNP: Invalid input(s): POCBNP CBG:  Recent Labs Lab 05/23/16 0537 05/23/16 1232 05/23/16 1729 05/24/16 0055 05/24/16 0600  GLUCAP 94 117* 208* 126* 83   D-Dimer No results for input(s): DDIMER in the last 72 hours. Hgb A1c No results for input(s): HGBA1C in the last 72 hours. Lipid Profile No results for input(s): CHOL, HDL, LDLCALC, TRIG, CHOLHDL, LDLDIRECT in the last 72 hours. Thyroid function studies No results for input(s): TSH, T4TOTAL, T3FREE, THYROIDAB in the last 72 hours.  Invalid input(s): FREET3 Anemia work up No results for input(s): VITAMINB12, FOLATE, FERRITIN, TIBC, IRON, RETICCTPCT in the last 72 hours. Urinalysis    Component Value Date/Time   COLORURINE YELLOW 04/02/2016 0036   APPEARANCEUR CLEAR 04/02/2016 0036   LABSPEC 1.034 (H) 04/02/2016 0036   PHURINE 5.5 04/02/2016 0036    GLUCOSEU NEGATIVE 04/02/2016 0036   HGBUR NEGATIVE 04/02/2016 0036   BILIRUBINUR SMALL (  A) 04/02/2016 0036   KETONESUR 40 (A) 04/02/2016 0036   PROTEINUR NEGATIVE 04/02/2016 0036   NITRITE NEGATIVE 04/02/2016 0036   LEUKOCYTESUR NEGATIVE 04/02/2016 0036   Sepsis Labs Invalid input(s): PROCALCITONIN,  WBC,  LACTICIDVEN Microbiology Recent Results (from the past 240 hour(s))  MRSA PCR Screening     Status: None   Collection Time: 05/18/16  3:32 AM  Result Value Ref Range Status   MRSA by PCR NEGATIVE NEGATIVE Final    Comment:        The GeneXpert MRSA Assay (FDA approved for NASAL specimens only), is one component of a comprehensive MRSA colonization surveillance program. It is not intended to diagnose MRSA infection nor to guide or monitor treatment for MRSA infections.      Time coordinating discharge: Over 30 minutes  SIGNED:   Rosita Fire, MD  [facility] 05/24/2016, 11:59 AM  If 7PM-7AM, please contact night-coverage [email or web address] Password [email or web address]

## 2016-05-26 ENCOUNTER — Telehealth: Payer: Self-pay | Admitting: Pulmonary Disease

## 2016-05-26 NOTE — Telephone Encounter (Signed)
Spoke with pt, states she was recently hospitalized and was given a cpap to use while in the hospital.  Pt wants to know if this is something she can have at home to help her from being hospitalized so frequently.   Pt has been scheduled with RA on 06/02/16 at 3:45.   Nothing further needed at this time.

## 2016-05-29 ENCOUNTER — Ambulatory Visit (HOSPITAL_COMMUNITY)
Admission: RE | Admit: 2016-05-29 | Discharge: 2016-05-29 | Disposition: A | Discharge status: Home / Self Care | Payer: Self-pay | Source: Ambulatory Visit | Attending: Interventional Radiology | Admitting: Interventional Radiology

## 2016-05-29 DIAGNOSIS — S22000A Wedge compression fracture of unspecified thoracic vertebra, initial encounter for closed fracture: Secondary | ICD-10-CM

## 2016-05-29 HISTORY — PX: IR GENERIC HISTORICAL: IMG1180011

## 2016-05-30 ENCOUNTER — Encounter (HOSPITAL_COMMUNITY): Payer: Self-pay | Admitting: Interventional Radiology

## 2016-06-02 ENCOUNTER — Encounter: Payer: Self-pay | Admitting: Pulmonary Disease

## 2016-06-02 ENCOUNTER — Ambulatory Visit (INDEPENDENT_AMBULATORY_CARE_PROVIDER_SITE_OTHER): Payer: Self-pay | Admitting: Pulmonary Disease

## 2016-06-02 VITALS — BP 118/72 | HR 90 | Ht 65.0 in | Wt 199.2 lb

## 2016-06-02 DIAGNOSIS — J441 Chronic obstructive pulmonary disease with (acute) exacerbation: Secondary | ICD-10-CM

## 2016-06-02 DIAGNOSIS — F411 Generalized anxiety disorder: Secondary | ICD-10-CM

## 2016-06-02 DIAGNOSIS — G4733 Obstructive sleep apnea (adult) (pediatric): Secondary | ICD-10-CM

## 2016-06-02 DIAGNOSIS — R5382 Chronic fatigue, unspecified: Secondary | ICD-10-CM

## 2016-06-02 MED ORDER — TIOTROPIUM BROMIDE MONOHYDRATE 2.5 MCG/ACT IN AERS: 2.0000 | INHALATION_SPRAY | Freq: Every day | RESPIRATORY_TRACT | 0 refills | Status: DC

## 2016-06-02 NOTE — Patient Instructions (Signed)
Stay on Breo once daily In addition takes Spiriva once daily  We will schedule sleep study  We will refer you to psychiatrist for anxiety

## 2016-06-02 NOTE — Assessment & Plan Note (Signed)
Stay on Breo once daily In addition takes Spiriva once daily  Due to repeated hospitalizations, I wonder if she would benefit from an NIV-she does seem to have hypercarbia and we could use NIV to target hypercarbia. We will schedule sleep study to better assess but if she does not have OSA, we could still consider NIV.  Unfortunately she does not qualify for oxygen therapy at this visit today

## 2016-06-02 NOTE — Progress Notes (Signed)
   Subjective:    Patient ID: Heather Banks, female    DOB: 1950/09/12, 65 y.o.   MRN: [id number]  HPI  65 yo smoker with COPD on 3l home O2 and adenocarcinoma lung, stage IV. She presented 06/2014 with  bilateral upper lobe lung mass w/ minmally enlarged hilar and mediastinal nodes, hypermetabolic on PET Underwent ENB guided biopsy , LUL bx was neg, RUL-2 targets showed adenoCA- felt to be synchronous tumors  Had disease progression on chemoRx x 6 cycles Completed immunotherapy  Palliative Radiotherapy in LUL mass completed in 10/2015   06/02/2016  Chief Complaint  Patient presents with  . Discuss being on CPAP    While Pt. was in hospital they placed her in the hospital and she stated it worked well for her and wanted to discuss being placed on one, Pt. states her breathing is doing ok, some coughing, Pt. denies chest tightness   Admitted 12/2015 for acute on chronic hypoxemic and hypercarbic respiratory failure secondary to severe acute COPD exacerbation & healthcare associated pneumonia. CT chest showed a small PE in the left lateral basal segment of the pulmonary artery was treated with Xarelto for 3 months Venous Doppler was negative.  She had 3 hospitalizations in the last 3 months During her last visit she was placed on BiPAP and this seemed to help. She wonders if she needs Pap therapy. She has severe anxiety and now has developed worsening anxiety about being able to sleep and not wake up due to her breathing  She developed vertebral fracture and underwent vertebroplasty, pain has improved significantly since then  Remains on BREO (although we are asked her to start [facility] last visit) .   We reviewed her imaging from 03/2016      Significant tests/ events  PFT 06/2014 FEV1 at 46%, ratio of 59. Diffusing capacity was decreased at 56% , FVC was 60%  CT chest 03/2016 ,  decreased in LUL mass. Adenopathy resolved. Stable right sided nodules.      Review of  Systems neg for any significant sore throat, dysphagia, itching, sneezing, nasal congestion or excess/ purulent secretions, fever, chills, sweats, unintended wt loss, pleuritic or exertional cp, hempoptysis, orthopnea pnd or change in chronic leg swelling. Also denies presyncope, palpitations, heartburn, abdominal pain, nausea, vomiting, diarrhea or change in bowel or urinary habits, dysuria,hematuria, rash, arthralgias, visual complaints, headache, numbness weakness or ataxia.     Objective:   Physical Exam   Gen. Pleasant, obese, in no distress ENT - no lesions, no post nasal drip, Steroid face Neck: No JVD, no thyromegaly, no carotid bruits Lungs: no use of accessory muscles, no dullness to percussion, decreased without rales or rhonchi  Cardiovascular: Rhythm regular, heart sounds  normal, no murmurs or gallops, no peripheral edema Musculoskeletal: No deformities, no cyanosis or clubbing , no tremors        Assessment & Plan:

## 2016-06-02 NOTE — Assessment & Plan Note (Signed)
Referral to psychiatry  Her baseline anxiety seems to have severely increased after her repeated hospitalizations

## 2016-06-03 ENCOUNTER — Encounter: Payer: Self-pay | Admitting: Family Medicine

## 2016-06-03 ENCOUNTER — Ambulatory Visit (INDEPENDENT_AMBULATORY_CARE_PROVIDER_SITE_OTHER): Payer: Self-pay | Admitting: Family Medicine

## 2016-06-03 VITALS — BP 118/71 | HR 92 | Temp 98.1°F | Resp 18 | Ht 65.0 in | Wt 199.0 lb

## 2016-06-03 DIAGNOSIS — Z9109 Other allergy status, other than to drugs and biological substances: Secondary | ICD-10-CM

## 2016-06-03 DIAGNOSIS — Z23 Encounter for immunization: Secondary | ICD-10-CM

## 2016-06-03 DIAGNOSIS — I10 Essential (primary) hypertension: Secondary | ICD-10-CM

## 2016-06-03 MED ORDER — LORATADINE 10 MG PO TABS: 10.0000 mg | ORAL_TABLET | Freq: Every day | ORAL | 0 refills | Status: AC

## 2016-06-03 MED ORDER — AMLODIPINE BESYLATE 5 MG PO TABS: 5.0000 mg | ORAL_TABLET | Freq: Every day | ORAL | 0 refills | Status: DC

## 2016-06-03 NOTE — Addendum Note (Signed)
Addended by: Benson Setting L on: 06/03/2016 04:29 PM   Modules accepted: Orders

## 2016-06-04 ENCOUNTER — Other Ambulatory Visit: Payer: Self-pay | Admitting: Pulmonary Disease

## 2016-06-04 DIAGNOSIS — G4733 Obstructive sleep apnea (adult) (pediatric): Secondary | ICD-10-CM

## 2016-06-04 NOTE — Patient Instructions (Signed)
Follow up in 3 months

## 2016-06-04 NOTE — Progress Notes (Signed)
Heather Banks, is a 65 y.o. female  HQP:[id number]  ZLD:[id number]  DOB - 22-Nov-1950  CC:  Chief Complaint  Patient presents with  . Follow-up  . Hypertension       HPI: Heather Banks is a 65 y.o. female here for routine follow-up. Patient has Stage 4 adenocarcinoma of left lung, COPD, recent compression of thoracic vertebrae, GERD. She was recently started on amlodipine for elevated blood pressure and is on claritin for allergic reactions. She is needing refills of these two medications today. She reports doing reasonably well and tumor has shrunk 30%.    Allergies  Allergen Reactions  . Ativan [Lorazepam] Other (See Comments)    Reaction:  Makes pt angry - opposite reaction of med should do  . Cinnamon Other (See Comments)    Seasonal scent trigger migraine as well as pt's overall immune system   Past Medical History:  Diagnosis Date  . Adenocarcinoma of left lung, stage 4 ([place]) 07/09/2014  . Compression fracture of thoracic vertebra (H[facility]) 04/21/2016  . COPD (chronic obstructive pulmonary disease) (HCC)    Albuterol neb as needed;Pulmicort neb daily  . Encounter for antineoplastic chemotherapy 01/21/2015  . Full code status 05/01/2015  . GERD (gastroesophageal reflux disease)    takes Pantoprazole and Zantac daily  . History of bronchitis    >5 yrs ago  . History of migraine    last one 2 wks ago  . Hx of radiation therapy 10/29/2015 to 11/16/2015   The Left upper lobe was treated to 37.5 Gy in 15 fractions at 2.5 Gy per fraction  . Lung mass   . Panic attacks    but doesn't take any meds  . Pneumonia    hx of-last time about 4+yrs ago   Current Outpatient Prescriptions on File Prior to Visit  Medication Sig Dispense Refill  . acidophilus (RISAQUAD) CAPS capsule Take 1 capsule by mouth daily.    Marland Kitchen albuterol (PROVENTIL HFA;VENTOLIN HFA) 108 (90 Base) MCG/ACT inhaler Inhale 2 puffs into the lungs every 6 (six) hours as needed for wheezing or shortness of breath. 1 Inhaler 0   . albuterol (PROVENTIL) (2.5 MG/3ML) 0.083% nebulizer solution Take 3 mLs (2.5 mg total) by nebulization every 6 (six) hours as needed for wheezing or shortness of breath. 75 mL 3  . aspirin-acetaminophen-caffeine (EXCEDRIN MIGRAINE) 903-009-23 MG per tablet Take 2 tablets by mouth every 6 (six) hours as needed for headache.    . calcium carbonate (OS-CAL - DOSED IN MG OF ELEMENTAL CALCIUM) 1250 (500 Ca) MG tablet Take 1 tablet by mouth daily with breakfast.    . cyanocobalamin 2000 MCG tablet Take 2,000 mcg by mouth daily.    . diphenhydrAMINE (BENADRYL) 25 mg capsule Take 25 mg by mouth every 6 (six) hours as needed for itching. Reported on 01/19/2016    . Lidocaine (ASPERCREME LIDOCAINE) 4 % PTCH Apply 1 patch topically daily as needed. Place on back as needed for pain    . mirtazapine (REMERON) 30 MG tablet Take 15 mg by mouth at bedtime as needed (for sleep).     . predniSONE (DELTASONE) 10 MG tablet Take 1 tablet (10 mg total) by mouth daily with breakfast. Take 40 mg daily for 3 days, 30 mg daily for 3 days, 20 mg daily for 3 days, then  10 mg daily for 3 days and then stop. 30 tablet 0  . Tiotropium Bromide Monohydrate (SPIRIVA RESPIMAT) 2.5 MCG/ACT AERS Inhale 2 puffs into the lungs daily. 1 Inhaler  0  . ALPRAZolam (XANAX) 0.5 MG tablet Take 0.5 mg by mouth at bedtime as needed for sleep or anxiety.    . diazepam (VALIUM) 2 MG tablet Take 1 tablet (2 mg total) by mouth every 6 (six) hours as needed for anxiety. (Patient not taking: Reported on 06/03/2016) 30 tablet 0  . famotidine (PEPCID) 20 MG tablet Take 1 tablet (20 mg total) by mouth daily. (Patient not taking: Reported on 06/03/2016) 15 tablet 0  . hydrocortisone 1 % ointment Apply 1 application topically 2 (two) times daily. (Patient not taking: Reported on 06/03/2016) 30 g 0  . traMADol (ULTRAM) 50 MG tablet Take 1 tablet (50 mg total) by mouth every 12 (twelve) hours as needed for moderate pain. (Patient not taking: Reported on  06/03/2016) 30 tablet 0  . umeclidinium-vilanterol (ANORO ELLIPTA) 62.5-25 MCG/INH AEPB Inhale 1 puff into the lungs daily. (Patient not taking: Reported on 06/03/2016) 2 each 0   Current Facility-Administered Medications on File Prior to Visit  Medication Dose Route Frequency Provider Last Rate Last Dose  . heparin lock flush 100 unit/mL  500 Units Intracatheter Once PRN Curt Bears, MD      . sodium chloride 0.9 % injection 10 mL  10 mL Intracatheter PRN Curt Bears, MD   10 mL at 07/31/15 1635  . sodium chloride 0.9 % injection 10 mL  10 mL Intracatheter PRN Curt Bears, MD       Family History  Problem Relation Age of Onset  . COPD Father    Social History   Social History  . Marital status: Widowed    Spouse name: N/A  . Number of children: N/A  . Years of education: N/A   Occupational History  . Not on file.   Social History Main Topics  . Smoking status: Former Smoker    Packs/day: 0.25    Years: 44.00    Types: Cigarettes    Quit date: 03/14/2016  . Smokeless tobacco: Never Used  . Alcohol use No     Comment: no alcohol in 7 yrs   . Drug use: No  . Sexual activity: Not on file   Other Topics Concern  . Not on file   Social History Narrative  . No narrative on file    Review of Systems: Constitutional: + for fatigue, loss of appetite, drinks protein shakes Skin: Negative HENT: Negative  Eyes: Negative  Neck: Negative Respiratory  + for shortness of breath with exertion Cardiovascular: + for imtermittent swelling of lower legs and feet.  Gastrointestinal: Negative Genitourinary: + for frequency Musculoskeletal: + for back pain  Neurological: Negative for Hematological: Negative  Psychiatric/Behavioral: + for anxiety   Objective:   Vitals:   06/03/16 0949  BP: 118/71  Pulse: 92  Resp: 18  Temp: 98.1 F (36.7 C)    Physical Exam: Constitutional: Patient appears well-developed and well-nourished. No distress. HENT: Normocephalic,  atraumatic, External right and left ear normal. Oropharynx is clear and moist.  Eyes: Conjunctivae and EOM are normal. PERRLA, no scleral icterus. Neck: Normal ROM. Neck supple. No lymphadenopathy, No thyromegaly. CVS: RRR, S1/S2 +, no murmurs, no gallops, no rubs Pulmonary: Effort and breath sounds normal, no stridor, rhonchi, wheezes, rales.  Abdominal: Soft. Normoactive BS,, no distension, tenderness, rebound or guarding.  Musculoskeletal: Normal range of motion. No edema and no tenderness.  Neuro: Alert.Normal muscle tone coordination. Non-focal Skin: Skin is warm and dry. No rash noted. Not diaphoretic. No erythema. No pallor. Psychiatric: Normal mood and affect.  Behavior, judgment, thought content normal.  Lab Results  Component Value Date   WBC 8.6 05/21/2016   HGB 12.6 05/21/2016   HCT 40.9 05/21/2016   MCV 98.8 05/21/2016   PLT 289 05/21/2016   Lab Results  Component Value Date   CREATININE 0.57 05/21/2016   BUN 16 05/21/2016   NA 136 05/21/2016   K 4.4 05/21/2016   CL 98 (L) 05/21/2016   CO2 33 (H) 05/21/2016    Lab Results  Component Value Date   HGBA1C 5.6 05/18/2016   Lipid Panel     Component Value Date/Time   CHOL 261 (H) 02/26/2016 0917   TRIG 185 (H) 02/26/2016 0917   HDL 52 02/26/2016 0917   CHOLHDL 5.0 02/26/2016 0917   VLDL 37 (H) 02/26/2016 0917   LDLCALC 172 (H) 02/26/2016 0917        Assessment and plan:   1. Need for Tdap vaccination  - Tdap vaccine greater than or equal to 7yo IM  2. Environmental allergies  - loratadine (CLARITIN) 10 MG tablet; Take 1 tablet (10 mg total) by mouth daily.  Dispense: 15 tablet; Refill: 0  3. Essential hypertension  - amLODipine (NORVASC) 5 MG tablet; Take 1 tablet (5 mg total) by mouth daily.  Dispense: 30 tablet; Refill: 0   No Follow-up on file.  The patient was given clear instructions to go to ER or return to medical center if symptoms don't improve, worsen or new problems develop. The patient  verbalized understanding.    Micheline Chapman FNP  06/04/2016, 1:49 PM

## 2016-06-12 ENCOUNTER — Other Ambulatory Visit: Payer: Self-pay | Admitting: Internal Medicine

## 2016-07-08 ENCOUNTER — Encounter (HOSPITAL_BASED_OUTPATIENT_CLINIC_OR_DEPARTMENT_OTHER): Payer: Medicaid - Out of State

## 2016-07-14 ENCOUNTER — Other Ambulatory Visit: Payer: Self-pay

## 2016-07-14 ENCOUNTER — Encounter: Payer: Self-pay | Admitting: Adult Health

## 2016-07-14 ENCOUNTER — Ambulatory Visit (INDEPENDENT_AMBULATORY_CARE_PROVIDER_SITE_OTHER): Payer: Self-pay | Admitting: Adult Health

## 2016-07-14 VITALS — BP 122/74 | HR 95 | Ht 65.0 in | Wt 198.2 lb

## 2016-07-14 DIAGNOSIS — I10 Essential (primary) hypertension: Secondary | ICD-10-CM

## 2016-07-14 DIAGNOSIS — J449 Chronic obstructive pulmonary disease, unspecified: Secondary | ICD-10-CM

## 2016-07-14 DIAGNOSIS — Z23 Encounter for immunization: Secondary | ICD-10-CM

## 2016-07-14 DIAGNOSIS — C3412 Malignant neoplasm of upper lobe, left bronchus or lung: Secondary | ICD-10-CM

## 2016-07-14 DIAGNOSIS — Z86711 Personal history of pulmonary embolism: Secondary | ICD-10-CM

## 2016-07-14 MED ORDER — AMLODIPINE BESYLATE 5 MG PO TABS: 5.0000 mg | ORAL_TABLET | Freq: Every day | ORAL | 1 refills | Status: DC

## 2016-07-14 NOTE — Assessment & Plan Note (Signed)
Provoked PE in 12/2015 s/p Xarelto x 3 month  Doing well off Xarelto

## 2016-07-14 NOTE — Assessment & Plan Note (Signed)
Doing well on BREO/Spriva   Plan  Patient Instructions  Prevnar 13 vaccine today .  Continue on BREO and Spiriva .  Follow up with Oncology as planned .  Follow up Dr. Elsworth Soho  In  3  months and As needed

## 2016-07-14 NOTE — Assessment & Plan Note (Signed)
Cont follow up with Dr. Earlie Server  Last CT chest w/ stable nodule and slight decrease in left lung mass.

## 2016-07-14 NOTE — Progress Notes (Signed)
Subjective:    Patient ID: Heather Banks, female    DOB: [birth year], 65 y.o.   MRN: [id number]  HPI 65 yo smoker with COPD and adenocarcinoma lung, stage IV.  TEST Heather Banks  Admitted 06/1314 for COPD exacerbation.  CT showed bilateral upper lobe lung mass w/ minmally enlarged hilar and mediastinal nodes, hypermetabolic on PET She was discharged on oxygen at 3 L. PFT showed an FEV1 at 46%, ratio of 59. Diffusing capacity was decreased at 56% , FVC was 60% Underwent ENB guided biopsy ,  LUL bx was neg, RUL-2 targets showed adenoCA- felt to be synchronous tumors Had disease progression on chemoRx x 6 cycles Completed  immunotherapy  CT chest 04/2015 showed a slight increase in the size of lesions compared to 02/2015  Palliative Radiotherapy in LUL mass completed in 10/2015  CT chest 01/01/16 , +PE, decreased in LUL mass. Adenopathy resolved. Stable right sided nodules.   07/14/2016 Follow up : COPD and Lung Cancer, PE  Pt returns for 1 month follow up .  She has COPD . She is on BREO . Spiriva was added last ov . She is getting BREO thru pt assistance program. [name] , feels it is helping her breathing w/ less dyspnea. Is trying to get Spiriva thru pt assistance program-says she is approved.  Has good and bad days with breathing . Get winded with walking long distance and inclines.  Pursed lip breathing helps her. tyring to walk more.   Frequent COPD flares with hospitalizations. Does have hypercarbia . She was set up for sleep study but did not go for it. No hospital /ER visit since last ov .  Says her insurance will not cover and is not going to go for it.    Hx of PE in June , Xarelto for 3 months . Stopped in Sept.  No hemoptysis or calf pain     Hx of lung cancer s/p chemo, immunotherapy  And palliative XRT  CT chest in 03/2016 showed decreased 3.2cm mass. Stable right lung nodules .  No weight loss .   Flu shot is utd. Discussed prevanar vaccine.     TEST /Events  PFT  06/2014 FEV1 at 46%, ratio of 59. Diffusing capacity was decreased at 56% , FVC was 60%  CT chest 03/2016 , decreased in LUL mass. Adenopathy resolved. Stable right sided nodules.    Past Medical History:  Diagnosis Date  . Adenocarcinoma of left lung, stage 4 ([place]) 07/09/2014  . Compression fracture of thoracic vertebra (HCC) 04/21/2016  . COPD (chronic obstructive pulmonary disease) (HCC)    Albuterol neb as needed;Pulmicort neb daily  . Encounter for antineoplastic chemotherapy 01/21/2015  . Full code status 05/01/2015  . GERD (gastroesophageal reflux disease)    takes Pantoprazole and Zantac daily  . History of bronchitis    >65 yrs ago  . History of migraine    last one 2 wks ago  . Hx of radiation therapy 10/29/2015 to 11/16/2015   The Left upper lobe was treated to 37.5 Gy in 15 fractions at 2.5 Gy per fraction  . Lung mass   . Panic attacks    but doesn't take any meds  . Pneumonia    hx of-last time about 65 yrs ago    Current Outpatient Prescriptions on File Prior to Visit  Medication Sig Dispense Refill  . acidophilus (RISAQUAD) CAPS capsule Take 1 capsule by mouth daily.    Marland Kitchen albuterol (PROVENTIL HFA;VENTOLIN HFA) 108 (90 Base) MCG/ACT  inhaler Inhale 2 puffs into the lungs every 6 (six) hours as needed for wheezing or shortness of breath. 1 Inhaler 0  . albuterol (PROVENTIL) (2.5 MG/3ML) 0.083% nebulizer solution Take 3 mLs (2.5 mg total) by nebulization every 6 (six) hours as needed for wheezing or shortness of breath. 75 mL 3  . amLODipine (NORVASC) 5 MG tablet Take 1 tablet (5 mg total) by mouth daily. 30 tablet 0  . aspirin-acetaminophen-caffeine (EXCEDRIN MIGRAINE) 789-381-01 MG per tablet Take 2 tablets by mouth every 6 (six) hours as needed for headache.    Marland Kitchen BREO ELLIPTA 100-25 MCG/INH AEPB INHALE 1 PUFF INTO THE LUNGS DAILY 60 each 5  . calcium carbonate (OS-CAL - DOSED IN MG OF ELEMENTAL CALCIUM) 1250 (500 Ca) MG tablet Take 1 tablet by mouth daily with breakfast.     . cyanocobalamin 2000 MCG tablet Take 2,000 mcg by mouth daily.    . diazepam (VALIUM) 2 MG tablet Take 1 tablet (2 mg total) by mouth every 6 (six) hours as needed for anxiety. 30 tablet 0  . diphenhydrAMINE (BENADRYL) 25 mg capsule Take 25 mg by mouth every 6 (six) hours as needed for itching. Reported on 01/19/2016    . hydrocortisone 1 % ointment Apply 1 application topically 2 (two) times daily. 30 g 0  . Lidocaine (ASPERCREME LIDOCAINE) 4 % PTCH Apply 1 patch topically daily as needed. Place on back as needed for pain    . loratadine (CLARITIN) 10 MG tablet Take 1 tablet (10 mg total) by mouth daily. 15 tablet 0  . Tiotropium Bromide Monohydrate (SPIRIVA RESPIMAT) 2.5 MCG/ACT AERS Inhale 2 puffs into the lungs daily. 1 Inhaler 0  . ALPRAZolam (XANAX) 0.5 MG tablet Take 0.5 mg by mouth at bedtime as needed for sleep or anxiety.    . famotidine (PEPCID) 20 MG tablet Take 1 tablet (20 mg total) by mouth daily. (Patient not taking: Reported on 07/14/2016) 15 tablet 0  . mirtazapine (REMERON) 30 MG tablet Take 15 mg by mouth at bedtime as needed (for sleep).     . traMADol (ULTRAM) 50 MG tablet Take 1 tablet (50 mg total) by mouth every 12 (twelve) hours as needed for moderate pain. (Patient not taking: Reported on 07/14/2016) 30 tablet 0   Current Facility-Administered Medications on File Prior to Visit  Medication Dose Route Frequency Provider Last Rate Last Dose  . heparin lock flush 100 unit/mL  500 Units Intracatheter Once PRN Curt Bears, MD      . sodium chloride 0.9 % injection 10 mL  10 mL Intracatheter PRN Curt Bears, MD   10 mL at 07/31/15 1635  . sodium chloride 0.9 % injection 10 mL  10 mL Intracatheter PRN Curt Bears, MD         Review of Systems neg for any significant sore throat, dysphagia, itching, sneezing, nasal congestion or excess/ purulent secretions, fever, chills, sweats, unintended wt loss, pleuritic or exertional cp, hempoptysis, orthopnea pnd or  change in chronic leg swelling. Also denies presyncope, palpitations, heartburn, abdominal pain, nausea, vomiting, diarrhea or change in bowel or urinary habits, dysuria,hematuria, rash, arthralgias, visual complaints, headache, numbness weakness or ataxia.     Objective:   Physical Exam Vitals:   07/14/16 0908  BP: 122/74  Pulse: 95  SpO2: 95%  Weight: 198 lb 3.2 oz (89.9 kg)  Height: '5\' 5"'$  (1.651 m)   GEN: A/Ox3; pleasant , NAD, obese    HEENT:  [place]/AT,  EACs-clear, TMs-wnl, NOSE-clear, THROAT-clear, no lesions,  no postnasal drip or exudate noted. Poor dentition   NECK:  Supple w/ fair ROM; no JVD; normal carotid impulses w/o bruits; no thyromegaly or nodules palpated; no lymphadenopathy.    RESP  Decreased BS in bases , no wheezing . . no accessory muscle use, no dullness to percussion  CARD:  RRR, no m/r/g  , tr peripheral edema, pulses intact, no cyanosis or clubbing.  GI:   Soft & nt; nml bowel sounds; no organomegaly or masses detected.   Musco: Warm bil, no deformities or joint swelling noted.   Neuro: alert, no focal deficits noted.    Skin: Warm, no lesions or rashes    Tammy Parrett NP-C  [facility]  07/14/2016

## 2016-07-14 NOTE — Patient Instructions (Addendum)
Prevnar 13 vaccine today .  Continue on BREO and Spiriva .  Follow up with Oncology as planned .  Follow up Dr. Elsworth Soho  In  3  months and As needed

## 2016-07-14 NOTE — Addendum Note (Signed)
Addended by: Parke Poisson E on: 07/14/2016 10:07 AM   Modules accepted: Orders

## 2016-07-15 ENCOUNTER — Other Ambulatory Visit (HOSPITAL_BASED_OUTPATIENT_CLINIC_OR_DEPARTMENT_OTHER): Payer: Medicaid - Out of State

## 2016-07-15 DIAGNOSIS — L270 Generalized skin eruption due to drugs and medicaments taken internally: Secondary | ICD-10-CM

## 2016-07-15 DIAGNOSIS — Z5112 Encounter for antineoplastic immunotherapy: Secondary | ICD-10-CM

## 2016-07-15 DIAGNOSIS — R5382 Chronic fatigue, unspecified: Secondary | ICD-10-CM

## 2016-07-15 DIAGNOSIS — F17200 Nicotine dependence, unspecified, uncomplicated: Secondary | ICD-10-CM

## 2016-07-15 DIAGNOSIS — C3492 Malignant neoplasm of unspecified part of left bronchus or lung: Secondary | ICD-10-CM

## 2016-07-15 DIAGNOSIS — J449 Chronic obstructive pulmonary disease, unspecified: Secondary | ICD-10-CM

## 2016-07-15 DIAGNOSIS — F411 Generalized anxiety disorder: Secondary | ICD-10-CM

## 2016-07-15 DIAGNOSIS — Z5111 Encounter for antineoplastic chemotherapy: Secondary | ICD-10-CM

## 2016-07-15 DIAGNOSIS — F329 Major depressive disorder, single episode, unspecified: Secondary | ICD-10-CM

## 2016-07-15 DIAGNOSIS — F32A Depression, unspecified: Secondary | ICD-10-CM

## 2016-07-15 DIAGNOSIS — C3412 Malignant neoplasm of upper lobe, left bronchus or lung: Secondary | ICD-10-CM | POA: Diagnosis not present

## 2016-07-15 DIAGNOSIS — R110 Nausea: Secondary | ICD-10-CM

## 2016-07-15 DIAGNOSIS — J441 Chronic obstructive pulmonary disease with (acute) exacerbation: Secondary | ICD-10-CM

## 2016-07-15 DIAGNOSIS — Z789 Other specified health status: Secondary | ICD-10-CM

## 2016-07-15 LAB — COMPREHENSIVE METABOLIC PANEL
ALBUMIN: 3.6 g/dL (ref 3.5–5.0)
ALK PHOS: 126 U/L (ref 40–150)
ALT: 18 U/L (ref 0–55)
ANION GAP: 10 meq/L (ref 3–11)
AST: 14 U/L (ref 5–34)
BUN: 14.3 mg/dL (ref 7.0–26.0)
CALCIUM: 10.1 mg/dL (ref 8.4–10.4)
CO2: 26 mEq/L (ref 22–29)
CREATININE: 0.7 mg/dL (ref 0.6–1.1)
Chloride: 104 mEq/L (ref 98–109)
EGFR: >90 mL/min/{1.73_m2} (ref >90)
Glucose: 111 mg/dl (ref 70–140)
Potassium: 4.1 mEq/L (ref 3.5–5.1)
Sodium: 140 mEq/L (ref 136–145)
Total Protein: 7.2 g/dL (ref 6.4–8.3)

## 2016-07-15 LAB — CBC WITH DIFFERENTIAL/PLATELET
BASO%: 0.9 % (ref 0.0–2.0)
BASOS ABS: 0.1 10*3/uL (ref 0.0–0.1)
EOS%: 3.4 % (ref 0.0–7.0)
Eosinophils Absolute: 0.3 10*3/uL (ref 0.0–0.5)
HEMATOCRIT: 43.2 % (ref 34.8–46.6)
HEMOGLOBIN: 14.3 g/dL (ref 11.6–15.9)
LYMPH#: 1.5 10*3/uL (ref 0.9–3.3)
LYMPH%: 19 % (ref 14.0–49.7)
MCH: 30.8 pg (ref 25.1–34.0)
MCHC: 33 g/dL (ref 31.5–36.0)
MCV: 93.2 fL (ref 79.5–101.0)
MONO#: 1.1 10*3/uL — AB (ref 0.1–0.9)
MONO%: 13.8 % (ref 0.0–14.0)
NEUT#: 4.9 10*3/uL (ref 1.5–6.5)
NEUT%: 62.9 % (ref 38.4–76.8)
PLATELETS: 360 10*3/uL (ref 145–400)
RBC: 4.63 10*6/uL (ref 3.70–5.45)
RDW: 15 % — AB (ref 11.2–14.5)
WBC: 7.9 10*3/uL (ref 3.9–10.3)

## 2016-07-16 ENCOUNTER — Other Ambulatory Visit: Payer: Self-pay | Admitting: Adult Health

## 2016-07-18 ENCOUNTER — Telehealth: Payer: Self-pay | Admitting: Medical Oncology

## 2016-07-18 NOTE — Telephone Encounter (Signed)
I left message that appt 12/26 will be cancelled and r/s to after CT scan and asked her to cal [place] after she know when Ct appt is.

## 2016-07-22 ENCOUNTER — Other Ambulatory Visit: Payer: Self-pay

## 2016-07-22 ENCOUNTER — Ambulatory Visit: Payer: Self-pay | Admitting: Internal Medicine

## 2016-07-24 ENCOUNTER — Telehealth: Payer: Self-pay | Admitting: Medical Oncology

## 2016-07-24 ENCOUNTER — Encounter: Payer: Self-pay | Admitting: Medical Oncology

## 2016-07-24 DIAGNOSIS — C3492 Malignant neoplasm of unspecified part of left bronchus or lung: Secondary | ICD-10-CM

## 2016-07-24 NOTE — Telephone Encounter (Signed)
[id number] : 12 month F/U LVMOM with patient for 12 month follow up on study. Patient was rescheduled from 12/26 appt with MD to 08/13/16,  Asked patient to return call to me regarding follow up study health questionnaire.  Adele Dan, RN, BSN Clinical Research 07/24/2016 2:21 PM

## 2016-07-24 NOTE — Progress Notes (Signed)
BMS 370: Group A, Arm B1: 76-monthFollow up  Patient returned my call from earlier today. Patient reports to be doing well now and feeling much better after her hospital admission in October (10/21 - 10/28). Patient completed the ETroutdalewith me over the phone. Patient to see Dr. MJulien NordmannJanuary 17 after her CT, scheduled January 15th. Patient denies any questions at this time and knows to call Dr. MJulien Nordmannor myself with any questions or concerns she may have. I thanked patient for her time and continued support of study.  LAdele Dan RN, BSN Clinical Research 07/24/2016 4:04 PM

## 2016-07-30 NOTE — Progress Notes (Signed)
Reviewed & agree with plan  

## 2016-08-04 ENCOUNTER — Telehealth: Payer: Self-pay | Admitting: Internal Medicine

## 2016-08-04 NOTE — Telephone Encounter (Signed)
LEFT MESSAGE RE 1/15 AND 1/22 APPOINTMENTS. PATIENT ALSO Hodgkins.

## 2016-08-09 ENCOUNTER — Other Ambulatory Visit: Payer: Self-pay | Admitting: Pulmonary Disease

## 2016-08-11 ENCOUNTER — Ambulatory Visit (HOSPITAL_COMMUNITY)
Admission: RE | Admit: 2016-08-11 | Discharge: 2016-08-11 | Disposition: A | Discharge status: Home / Self Care | Payer: Medicaid - Out of State | Source: Ambulatory Visit | Attending: Internal Medicine | Admitting: Internal Medicine

## 2016-08-11 ENCOUNTER — Encounter (HOSPITAL_COMMUNITY): Payer: Self-pay

## 2016-08-11 ENCOUNTER — Other Ambulatory Visit (HOSPITAL_BASED_OUTPATIENT_CLINIC_OR_DEPARTMENT_OTHER): Payer: Self-pay

## 2016-08-11 DIAGNOSIS — C3412 Malignant neoplasm of upper lobe, left bronchus or lung: Secondary | ICD-10-CM

## 2016-08-11 DIAGNOSIS — R160 Hepatomegaly, not elsewhere classified: Secondary | ICD-10-CM | POA: Insufficient documentation

## 2016-08-11 DIAGNOSIS — I2510 Atherosclerotic heart disease of native coronary artery without angina pectoris: Secondary | ICD-10-CM | POA: Insufficient documentation

## 2016-08-11 DIAGNOSIS — Z9889 Other specified postprocedural states: Secondary | ICD-10-CM | POA: Insufficient documentation

## 2016-08-11 DIAGNOSIS — S22000A Wedge compression fracture of unspecified thoracic vertebra, initial encounter for closed fracture: Secondary | ICD-10-CM

## 2016-08-11 DIAGNOSIS — M438X4 Other specified deforming dorsopathies, thoracic region: Secondary | ICD-10-CM | POA: Insufficient documentation

## 2016-08-11 DIAGNOSIS — C3492 Malignant neoplasm of unspecified part of left bronchus or lung: Secondary | ICD-10-CM | POA: Insufficient documentation

## 2016-08-11 LAB — CBC WITH DIFFERENTIAL/PLATELET
BASO%: 1.2 % (ref 0.0–2.0)
Basophils Absolute: 0.1 10*3/uL (ref 0.0–0.1)
EOS%: 2.8 % (ref 0.0–7.0)
Eosinophils Absolute: 0.2 10*3/uL (ref 0.0–0.5)
HCT: 42.6 % (ref 34.8–46.6)
HGB: 14.1 g/dL (ref 11.6–15.9)
LYMPH%: 20.1 % (ref 14.0–49.7)
MCH: 31.1 pg (ref 25.1–34.0)
MCHC: 33.1 g/dL (ref 31.5–36.0)
MCV: 93.9 fL (ref 79.5–101.0)
MONO#: 0.8 10*3/uL (ref 0.1–0.9)
MONO%: 12.3 % (ref 0.0–14.0)
NEUT%: 63.6 % (ref 38.4–76.8)
NEUTROS ABS: 4.2 10*3/uL (ref 1.5–6.5)
Platelets: 363 10*3/uL (ref 145–400)
RBC: 4.54 10*6/uL (ref 3.70–5.45)
RDW: 14.6 % — ABNORMAL HIGH (ref 11.2–14.5)
WBC: 6.5 10*3/uL (ref 3.9–10.3)
lymph#: 1.3 10*3/uL (ref 0.9–3.3)

## 2016-08-11 LAB — COMPREHENSIVE METABOLIC PANEL
ALBUMIN: 3.7 g/dL (ref 3.5–5.0)
ALK PHOS: 131 U/L (ref 40–150)
ALT: 13 U/L (ref 0–55)
AST: 14 U/L (ref 5–34)
Anion Gap: 11 mEq/L (ref 3–11)
BUN: 7.4 mg/dL (ref 7.0–26.0)
CALCIUM: 9.9 mg/dL (ref 8.4–10.4)
CO2: 25 mEq/L (ref 22–29)
Chloride: 105 mEq/L (ref 98–109)
Creatinine: 0.7 mg/dL (ref 0.6–1.1)
Glucose: 88 mg/dl (ref 70–140)
Potassium: 4.8 mEq/L (ref 3.5–5.1)
Sodium: 142 mEq/L (ref 136–145)
TOTAL PROTEIN: 7.2 g/dL (ref 6.4–8.3)

## 2016-08-11 MED ORDER — IOPAMIDOL (ISOVUE-300) INJECTION 61%
INTRAVENOUS | Status: AC
  Administered 2016-08-11: 100 mL
  Filled 2016-08-11: qty 100

## 2016-08-13 ENCOUNTER — Ambulatory Visit: Payer: Medicaid - Out of State | Admitting: Internal Medicine

## 2016-08-18 ENCOUNTER — Encounter: Payer: Self-pay | Admitting: Medical Oncology

## 2016-08-18 ENCOUNTER — Ambulatory Visit (HOSPITAL_BASED_OUTPATIENT_CLINIC_OR_DEPARTMENT_OTHER): Payer: Self-pay | Admitting: Internal Medicine

## 2016-08-18 ENCOUNTER — Encounter: Payer: Self-pay | Admitting: Internal Medicine

## 2016-08-18 ENCOUNTER — Telehealth: Payer: Self-pay | Admitting: Internal Medicine

## 2016-08-18 VITALS — BP 133/95 | HR 107 | Temp 98.2°F | Resp 19 | Ht 65.0 in | Wt 199.4 lb

## 2016-08-18 DIAGNOSIS — J449 Chronic obstructive pulmonary disease, unspecified: Secondary | ICD-10-CM

## 2016-08-18 DIAGNOSIS — F064 Anxiety disorder due to known physiological condition: Secondary | ICD-10-CM

## 2016-08-18 DIAGNOSIS — Z7189 Other specified counseling: Secondary | ICD-10-CM

## 2016-08-18 DIAGNOSIS — C3412 Malignant neoplasm of upper lobe, left bronchus or lung: Secondary | ICD-10-CM

## 2016-08-18 DIAGNOSIS — C3492 Malignant neoplasm of unspecified part of left bronchus or lung: Secondary | ICD-10-CM

## 2016-08-18 DIAGNOSIS — Z5111 Encounter for antineoplastic chemotherapy: Secondary | ICD-10-CM

## 2016-08-18 MED ORDER — ALPRAZOLAM 0.25 MG PO TABS: 0.2500 mg | ORAL_TABLET | Freq: Two times a day (BID) | ORAL | 0 refills | Status: DC | PRN

## 2016-08-18 MED ORDER — DEXAMETHASONE 4 MG PO TABS: ORAL_TABLET | ORAL | 1 refills | Status: DC

## 2016-08-18 NOTE — Progress Notes (Signed)
START ON PATHWAY REGIMEN - Non-Small Cell Lung  [id number]: Docetaxel 75 mg/m2 + Ramucirumab 10 mg/kg q21 Days Until Progression or Unacceptable Toxicity   A cycle is every 21 days:     Ramucirumab (Cyramza (R)) 10 mg/kg in 250 mL NS IV over 60 minutes every 21 days on day 1, PRIOR TO DOCETAXEL. Not stable in D5W - dilute in NS only. Urine protein check recommended at baseline and periodically during therapy Dose Mod: None     Docetaxel (Taxotere(R)) 75 mg/m2 in 250 mL NS IV over 60 minutes every 21 days on day 1 Dose Mod: None Additional Orders: Premedicate with dexamethasone 8 mg PO BID for three days beginning 1 day prior to therapy.  **Always confirm dose/schedule in your pharmacy ordering system**    Patient Characteristics: Stage IV Metastatic, Non Squamous, Third Line - Chemotherapy/Immunotherapy, PS = 0, 1, Prior PD-1/PD-L1 Inhibitor or No Prior PD-1/PD-L1 Inhibitor and Not a Candidate for Immunotherapy AJCC T Category: T2a Current Disease Status: Distant Metastases AJCC N Category: N0 AJCC M Category: M1a AJCC 8 Stage Grouping: IVA Histology: Non Squamous Cell ROS1 Rearrangement Status: Quantity Not Sufficient T790M Mutation Status: Not Applicable - EGFR Mutation Negative/Unknown Other Mutations/Biomarkers: No Other Actionable Mutations PD-L1 Expression Status: Quantity Not Sufficient Chemotherapy/Immunotherapy LOT: Third Line Chemotherapy/Immunotherapy Molecular Targeted Therapy: Not Appropriate ALK Translocation Status: Quantity Not Sufficient Would you be surprised if this patient died  in the next year? I would NOT be surprised if this patient died in the next year EGFR Mutation Status: Quantity Not Sufficient BRAF V600E Mutation Status: Quantity Not Sufficient Performance Status: PS = 0, 1 Immunotherapy Candidate Status: Not a Candidate for Immunotherapy Prior Immunotherapy Status: Prior PD-1/PD-L1 Inhibitor  Intent of Therapy: Non-Curative / Palliative Intent,  Discussed with Patient

## 2016-08-18 NOTE — Progress Notes (Signed)
BMS 370 (patient in follow-up with study)  Patient in today for MD to review CT scan completed on 08/11/2016. After patient's appointment with MD, patient and I reviewed new consent form for Group A, Version 4, dated 04/14/16. I explained to patient that the majority of changes to this consent were related to the Nivolumab treatment arm, patient received pemetrexed only treatment arm. Patient and I went through the consent form page by page noting the changes to this version 4 consent form were highlighted to the patient and in relation to her treatment arm. Patient denied having any questions at this time. Patient initialed and dated each page and signed and dated where indicated in the consent form. Patient opted not to wait for her copy since she will be back in clinic to start a new chemotherapy treatment on 1/30th and asked for me to provide her with her copy of signed consent during that time. Per MD, CT scan showing evidence of disease progression and patient is choosing to start new treatment of docetaxel and cyramza.  I thanked patient for her time and continued support of study and encouraged her to contact Dr. Julien Nordmann or myself with any questions or concerns she may have.  Adele Dan, RN, BSN Clinical Research 08/18/2016 1:48 PM

## 2016-08-18 NOTE — Progress Notes (Signed)
[place] [phone]) [phone]   Fax:(336) [phone]  OFFICE PROGRESS NOTE  [name], NP [address] [place] [address]  DIAGNOSIS: Stage IV (T2a, N0, M1a) non-small cell lung cancer, adenocarcinoma presented with bilateral pulmonary lesions. This could be also consider as synchronous primary tumors.  PRIOR THERAPY:  1) Systemic chemotherapy with carboplatin for AUC of 5 and Alimta 500 MG/M2 every 3 weeks. Status post 6 cycles. 2) Treatment according to the BMS checkmate 370 clinical trial. Randomized to group A arm B1 treatment with pemetrexed (Alimta) 500 mg/m given every 3 weeks. Status post 6 cycles discontinued secondary to disease progression. 3) Tecentriq 1200 mg IV every 3 weeks status post 6 cycles discontinued secondary to significant skin rash. 4) palliative radiotherapy to the left upper lobe lung mass under the care of Dr. Lisbeth Renshaw completed on 11/16/2015.  CURRENT THERAPY: Systemic chemotherapy with docetaxel 75 MG/M2 and Cyramza 10 MG/KG every 3 weeks with Neulasta support. First dose 08/25/2016.  INTERVAL HISTORY: Heather Banks 66 y.o. female returns to the clinic today for follow-up visit. The patient has been on observation since February 2017 after completion of the palliative radiotherapy to the left upper lobe lung mass. She is feeling fine today with no specific complaints. She still very anxious about her scan results. She denied having any significant chest pain, shortness of breath, cough or hemoptysis. She has no fever or chills. She denied having any significant weight loss or night sweats. She has no nausea, vomiting, diarrhea or constipation. She has no headache or visual changes. She had repeat CT scan of the chest, abdomen and pelvis performed recently and she is here for evaluation and discussion of her scan results and treatment options.   MEDICAL HISTORY: Past Medical History:  Diagnosis Date  . Adenocarcinoma of left lung,  stage 4 ([place]) 07/09/2014  . Compression fracture of thoracic vertebra ([facility]) 04/21/2016  . COPD (chronic obstructive pulmonary disease) ([facility])    Albuterol neb as needed;Pulmicort neb daily  . Encounter for antineoplastic chemotherapy 01/21/2015  . Full code status 05/01/2015  . GERD (gastroesophageal reflux disease)    takes Pantoprazole and Zantac daily  . History of bronchitis    >5 yrs ago  . History of migraine    last one 2 wks ago  . Hx of radiation therapy 10/29/2015 to 11/16/2015   The Left upper lobe was treated to 37.5 Gy in 15 fractions at 2.5 Gy per fraction  . Lung mass   . Panic attacks    but doesn't take any meds  . Pneumonia    hx of-last time about 4+yrs ago    ALLERGIES:  is allergic to ativan [lorazepam] and cinnamon.  MEDICATIONS:  Current Outpatient Prescriptions  Medication Sig Dispense Refill  . acidophilus (RISAQUAD) CAPS capsule Take 1 capsule by mouth daily.    Marland Kitchen albuterol (PROVENTIL) (2.5 MG/3ML) 0.083% nebulizer solution Take 3 mLs (2.5 mg total) by nebulization every 6 (six) hours as needed for wheezing or shortness of breath. 75 mL 3  . amLODipine (NORVASC) 5 MG tablet Take 1 tablet (5 mg total) by mouth daily. 30 tablet 1  . aspirin-acetaminophen-caffeine (EXCEDRIN MIGRAINE) 188-416-60 MG per tablet Take 2 tablets by mouth every 6 (six) hours as needed for headache.    Marland Kitchen BREO ELLIPTA 100-25 MCG/INH AEPB INHALE 1 PUFF INTO THE LUNGS DAILY 60 each 5  . calcium carbonate (OS-CAL - DOSED IN MG OF ELEMENTAL CALCIUM) 1250 (500 Ca) MG tablet  Take 1 tablet by mouth daily with breakfast.    . cholecalciferol (VITAMIN D) 1000 units tablet Take 1,000 Units by mouth daily.    . cyanocobalamin 2000 MCG tablet Take 2,000 mcg by mouth daily.    . diazepam (VALIUM) 2 MG tablet Take 1 tablet (2 mg total) by mouth every 6 (six) hours as needed for anxiety. 30 tablet 0  . Lidocaine (ASPERCREME LIDOCAINE) 4 % PTCH Apply 1 patch topically daily as needed. Place on back as  needed for pain    . loratadine (CLARITIN) 10 MG tablet Take 1 tablet (10 mg total) by mouth daily. 15 tablet 0  . mirtazapine (REMERON) 30 MG tablet Take 15 mg by mouth at bedtime as needed (for sleep).     . SPIRIVA HANDIHALER 18 MCG inhalation capsule PLACE 1 CAPSULE INTO THE INHALER AND INHALE EVERY DAY 30 capsule 5  . Tiotropium Bromide Monohydrate (SPIRIVA RESPIMAT) 2.5 MCG/ACT AERS Inhale 2 puffs into the lungs daily. 1 Inhaler 0  . VENTOLIN HFA 108 (90 Base) MCG/ACT inhaler INHALE 2 PUFFS BY MOUTH EVERY 6 HOURS AS NEEDED FOR WHEEZE OR SHORTNESS OF BREATH 18 g 5  . diphenhydrAMINE (BENADRYL) 25 mg capsule Take 25 mg by mouth every 6 (six) hours as needed for itching. Reported on 01/19/2016    . famotidine (PEPCID) 20 MG tablet Take 1 tablet (20 mg total) by mouth daily. (Patient not taking: Reported on 07/14/2016) 15 tablet 0   No current facility-administered medications for this visit.    Facility-Administered Medications Ordered in Other Visits  Medication Dose Route Frequency Provider Last Rate Last Dose  . heparin lock flush 100 unit/mL  500 Units Intracatheter Once PRN Curt Bears, MD      . sodium chloride 0.9 % injection 10 mL  10 mL Intracatheter PRN Curt Bears, MD   10 mL at 07/31/15 1635  . sodium chloride 0.9 % injection 10 mL  10 mL Intracatheter PRN Curt Bears, MD        SURGICAL HISTORY:  Past Surgical History:  Procedure Laterality Date  . ABDOMINAL HYSTERECTOMY    . APPENDECTOMY    . IR GENERIC HISTORICAL  05/02/2016   IR RADIOLOGIST EVAL & MGMT 05/02/2016 [facility]  . IR GENERIC HISTORICAL  05/08/2016   IR VERTEBROPLASTY EA ADDL (T&LS) BX INC UNI/BIL INC INJECT/IMAGING 05/08/2016 Luanne Bras, MD [facility]  . IR GENERIC HISTORICAL  05/08/2016   IR VERTEBROPLASTY CERV/THOR BX INC UNI/BIL INC/INJECT/IMAGING 05/08/2016 Luanne Bras, MD [facility]  . IR GENERIC HISTORICAL  05/29/2016   IR RADIOLOGIST EVAL & MGMT 05/29/2016 [facility]   . OOPHORECTOMY    . PORTACATH PLACEMENT  jan. 2016  . TONSILLECTOMY    . VIDEO BRONCHOSCOPY WITH ENDOBRONCHIAL NAVIGATION N/A 08/14/2014   Procedure: VIDEO BRONCHOSCOPY WITH ENDOBRONCHIAL NAVIGATION;  Surgeon: Rigoberto Noel, MD;  Location: [facility];  Service: Thoracic;  Laterality: N/A;    REVIEW OF SYSTEMS:  Constitutional: negative Eyes: negative Ears, nose, mouth, throat, and face: negative Respiratory: negative Cardiovascular: negative Gastrointestinal: negative Genitourinary:negative Integument/breast: negative Hematologic/lymphatic: negative Musculoskeletal:negative Neurological: negative Behavioral/Psych: negative Endocrine: negative Allergic/Immunologic: negative   PHYSICAL EXAMINATION: General appearance: alert, cooperative and no distress Head: Normocephalic, without obvious abnormality, atraumatic Neck: no adenopathy, no JVD, supple, symmetrical, trachea midline and thyroid not enlarged, symmetric, no tenderness/mass/nodules Lymph nodes: Cervical, supraclavicular, and axillary nodes normal. Resp: clear to auscultation bilaterally Back: symmetric, no curvature. ROM normal. No CVA tenderness. Cardio: regular rate and rhythm, S1, S2 normal,  no murmur, click, rub or gallop GI: soft, non-tender; bowel sounds normal; no masses,  no organomegaly Extremities: extremities normal, atraumatic, no cyanosis or edema Neurologic: Alert and oriented X 3, normal strength and tone. Normal symmetric reflexes. Normal coordination and gait   ECOG PERFORMANCE STATUS: 0 - Asymptomatic  Blood pressure (!) 133/95, pulse (!) 107, temperature 98.2 F (36.8 C), temperature source Oral, resp. rate 19, height '5\' 5"'$  (1.651 m), weight 199 lb 6.4 oz (90.4 kg), SpO2 97 %.  LABORATORY DATA: Lab Results  Component Value Date   WBC 6.5 08/11/2016   HGB 14.1 08/11/2016   HCT 42.6 08/11/2016   MCV 93.9 08/11/2016   PLT 363 08/11/2016      Chemistry      Component Value Date/Time   NA 142  08/11/2016 1141   K 4.8 08/11/2016 1141   CL 98 (L) 05/21/2016 0325   CO2 25 08/11/2016 1141   BUN 7.4 08/11/2016 1141   CREATININE 0.7 08/11/2016 1141      Component Value Date/Time   CALCIUM 9.9 08/11/2016 1141   ALKPHOS 131 08/11/2016 1141   AST 14 08/11/2016 1141   ALT 13 08/11/2016 1141   BILITOT <0.22 08/11/2016 1141       RADIOGRAPHIC STUDIES: Ct Chest W Contrast  Result Date: 08/11/2016 CLINICAL DATA:  Stage IV adenocarcinoma of left lung. Restaging. Recent T3-4 fracture with kyphoplasty. Upper chest pain and cough. Immunotherapy complete. EXAM: CT CHEST, ABDOMEN, AND PELVIS WITH CONTRAST TECHNIQUE: Multidetector CT imaging of the chest, abdomen and pelvis was performed following the standard protocol during bolus administration of intravenous contrast. CONTRAST:  1 ISOVUE-300 IOPAMIDOL (ISOVUE-300) INJECTION 61% COMPARISON:  04/10/2016 CTs. Most recent chest radiograph of 05/18/2016. FINDINGS: CT CHEST FINDINGS Cardiovascular: Aortic and branch vessel atherosclerosis. Normal heart size, without pericardial effusion. Multivessel coronary artery atherosclerosis. A right-sided Port-A-Cath which terminates at the mid right atrium. No central pulmonary embolism, on this non-dedicated study. Mediastinum/Nodes: No supraclavicular adenopathy. No mediastinal or hilar adenopathy. Lungs/Pleura: No pleural fluid. Moderate centrilobular emphysema. Bilobed right upper lobe pulmonary nodule has enlarged. The more lateral and cephalad portion measures 1.7 by a 1.7 cm on image 43/series 7. Compare 1.3 x 1.2 cm on the prior exam (when remeasured). The more inferior and medial portion measures 1.6 x 1.6 cm on image 46/series 7. Compare 1.4 x 1.4 cm on the prior exam (when remeasured). Right upper lobe sub solid lung lesion measures 3.1 x 3.5 cm on image 56/series 7 versus 2.9 x 1.9 cm on the prior. 3 mm left lower lobe pulmonary nodule on image 64/series 7 is new. Posterior left upper lobe lung mass  measures 4.3 x 3.1 cm on image 32/series 7 versus 2.7 x 2.8 cm on the prior. Tumor versus less likely treatment effects extend more centrally, including surrounding the left upper lobe posterior segmental bronchi on image 40/series 7. Musculoskeletal: Compression deformities involving T3-T4 again identified. Interval vertebral augmentation at both levels. The T4 compression deformity may have progressed minimally. CT ABDOMEN PELVIS FINDINGS Hepatobiliary: Hepatomegaly at 19.1 cm craniocaudal. No focal liver lesion. Normal gallbladder, without biliary ductal dilatation. Pancreas: Normal, without mass or ductal dilatation. Spleen: Normal in size, without focal abnormality. Adrenals/Urinary Tract: Normal adrenal glands. Normal left kidney. Right renal cysts and too small to characterize lesions. No hydronephrosis. Normal urinary bladder. Stomach/Bowel: Normal stomach, without wall thickening. Normal colon and terminal ileum. Normal small bowel. Vascular/Lymphatic: Aortic and branch vessel atherosclerosis. Nonaneurysmal dilatation of the infrarenal aorta at 2.5 cm. No abdominopelvic  adenopathy. Reproductive: Hysterectomy.  No adnexal mass. Other: No significant free fluid. No evidence of omental or peritoneal disease. Musculoskeletal: No acute osseous abnormality. IMPRESSION: CT CHEST IMPRESSION 1. Enlargement of left upper lobe lung mass with new or increased extension centrally. 2. Mild enlargement of right upper lobe pulmonary nodules. 3. No thoracic adenopathy. 4. Age advanced coronary artery atherosclerosis. Recommend assessment of coronary risk factors and consideration of medical therapy. 5. Interval T3 and T4 vertebral augmentation. CT ABDOMEN AND PELVIS IMPRESSION 1. No acute process or evidence of metastatic disease in the abdomen or pelvis. 2. Hepatomegaly. Electronically Signed   By: Abigail Miyamoto M.D.   On: 08/11/2016 16:01   Ct Abdomen Pelvis W Contrast  Result Date: 08/11/2016 CLINICAL DATA:  Stage IV  adenocarcinoma of left lung. Restaging. Recent T3-4 fracture with kyphoplasty. Upper chest pain and cough. Immunotherapy complete. EXAM: CT CHEST, ABDOMEN, AND PELVIS WITH CONTRAST TECHNIQUE: Multidetector CT imaging of the chest, abdomen and pelvis was performed following the standard protocol during bolus administration of intravenous contrast. CONTRAST:  1 ISOVUE-300 IOPAMIDOL (ISOVUE-300) INJECTION 61% COMPARISON:  04/10/2016 CTs. Most recent chest radiograph of 05/18/2016. FINDINGS: CT CHEST FINDINGS Cardiovascular: Aortic and branch vessel atherosclerosis. Normal heart size, without pericardial effusion. Multivessel coronary artery atherosclerosis. A right-sided Port-A-Cath which terminates at the mid right atrium. No central pulmonary embolism, on this non-dedicated study. Mediastinum/Nodes: No supraclavicular adenopathy. No mediastinal or hilar adenopathy. Lungs/Pleura: No pleural fluid. Moderate centrilobular emphysema. Bilobed right upper lobe pulmonary nodule has enlarged. The more lateral and cephalad portion measures 1.7 by a 1.7 cm on image 43/series 7. Compare 1.3 x 1.2 cm on the prior exam (when remeasured). The more inferior and medial portion measures 1.6 x 1.6 cm on image 46/series 7. Compare 1.4 x 1.4 cm on the prior exam (when remeasured). Right upper lobe sub solid lung lesion measures 3.1 x 3.5 cm on image 56/series 7 versus 2.9 x 1.9 cm on the prior. 3 mm left lower lobe pulmonary nodule on image 64/series 7 is new. Posterior left upper lobe lung mass measures 4.3 x 3.1 cm on image 32/series 7 versus 2.7 x 2.8 cm on the prior. Tumor versus less likely treatment effects extend more centrally, including surrounding the left upper lobe posterior segmental bronchi on image 40/series 7. Musculoskeletal: Compression deformities involving T3-T4 again identified. Interval vertebral augmentation at both levels. The T4 compression deformity may have progressed minimally. CT ABDOMEN PELVIS FINDINGS  Hepatobiliary: Hepatomegaly at 19.1 cm craniocaudal. No focal liver lesion. Normal gallbladder, without biliary ductal dilatation. Pancreas: Normal, without mass or ductal dilatation. Spleen: Normal in size, without focal abnormality. Adrenals/Urinary Tract: Normal adrenal glands. Normal left kidney. Right renal cysts and too small to characterize lesions. No hydronephrosis. Normal urinary bladder. Stomach/Bowel: Normal stomach, without wall thickening. Normal colon and terminal ileum. Normal small bowel. Vascular/Lymphatic: Aortic and branch vessel atherosclerosis. Nonaneurysmal dilatation of the infrarenal aorta at 2.5 cm. No abdominopelvic adenopathy. Reproductive: Hysterectomy.  No adnexal mass. Other: No significant free fluid. No evidence of omental or peritoneal disease. Musculoskeletal: No acute osseous abnormality. IMPRESSION: CT CHEST IMPRESSION 1. Enlargement of left upper lobe lung mass with new or increased extension centrally. 2. Mild enlargement of right upper lobe pulmonary nodules. 3. No thoracic adenopathy. 4. Age advanced coronary artery atherosclerosis. Recommend assessment of coronary risk factors and consideration of medical therapy. 5. Interval T3 and T4 vertebral augmentation. CT ABDOMEN AND PELVIS IMPRESSION 1. No acute process or evidence of metastatic disease in the abdomen or pelvis.  2. Hepatomegaly. Electronically Signed   By: Abigail Miyamoto M.D.   On: 08/11/2016 16:01    ASSESSMENT AND PLAN:  This is a very pleasant 66 years old white female with a stage IV non-small cell lung cancer, adenocarcinoma status post induction systemic chemotherapy with carboplatin and Alimta followed by maintenance treatment with Alimta on a clinical trial followed by treatment with immunotherapy with Tecentriq (Atezolizumab) discontinued secondary to disease progression. The patient was also treated with palliative radiotherapy to the enlarging left upper lobe lung mass. She has been observation since  April 2017. She had CT scan of the chest, abdomen and pelvis performed recently. I personally and independently reviewed the scan images and discuss the results with the patient today. Unfortunately the CT scan showed evidence for disease progression. I discussed with the patient several options for treatment of her condition. I also discussed with her the goals of care and she understands that her treatment is palliative in nature. She was given the option of palliative care versus consideration of palliative systemic chemotherapy with docetaxel 75 MG/M2 in addition to Cyramza 10 MG/KG every 3 weeks with Neulasta support. I discussed with the patient adverse effect of this treatment including but not limited to alopecia, myelosuppression, nausea and vomiting, peripheral neuropathy, liver or renal dysfunction. I also discussed with her the adverse effect of Cyramza including pulmonary hemorrhage, GI perforation and wound healing delay. She is interested in proceeding with systemic chemotherapy. I will call her pharmacy with Decadron premedication 8 mg by mouth twice a day the day before, day of and day after the chemotherapy every 3 weeks. For anxiety, I gave the patient prescription for Xanax 0.25 mg by mouth twice a day as needed. She is expected to start the first cycle of this treatment next week. She would come back for follow-up visit in 4 weeks for evaluation before starting cycle #2. I gave the patient the time to ask questions and I answered them completely to her satisfaction. She was advised to call immediately if she has any concerning symptoms in the interval. The patient voices understanding of current disease status and treatment options and is in agreement with the current care plan.  All questions were answered. The patient knows to call the clinic with any problems, questions or concerns. We can certainly see the patient much sooner if necessary.  Disclaimer: This note was dictated  with voice recognition software. Similar sounding words can inadvertently be transcribed and may not be corrected upon review.

## 2016-08-18 NOTE — Telephone Encounter (Signed)
Appointments scheduled per 1/22 LOS. Patient requested to have appointments on Tuesdays rather than Mondays. Patient given AVS report and calendars with future scheduled appointments.

## 2016-08-25 ENCOUNTER — Encounter: Payer: Self-pay | Admitting: Internal Medicine

## 2016-08-25 NOTE — Progress Notes (Signed)
Received voicemail from [place] in pharmacy regarding change of treatment plan for uninsured patient due to come in tomorrow. Reviewed resources for possible drug assistance. Patient may apply for assistance with Cyramza through [facility] Patient Assistance Program. Attempted to reach patient to see if she is able to bring her proof of income when she comes tomorrow. No answer. Will try again in the morning being that she lives in [place].

## 2016-08-26 ENCOUNTER — Encounter: Payer: Self-pay | Admitting: Medical Oncology

## 2016-08-26 ENCOUNTER — Ambulatory Visit (HOSPITAL_BASED_OUTPATIENT_CLINIC_OR_DEPARTMENT_OTHER): Payer: Self-pay

## 2016-08-26 ENCOUNTER — Other Ambulatory Visit (HOSPITAL_BASED_OUTPATIENT_CLINIC_OR_DEPARTMENT_OTHER): Payer: Self-pay

## 2016-08-26 ENCOUNTER — Encounter: Payer: Self-pay | Admitting: Internal Medicine

## 2016-08-26 VITALS — BP 139/81 | HR 88 | Temp 97.9°F | Resp 20

## 2016-08-26 DIAGNOSIS — C3492 Malignant neoplasm of unspecified part of left bronchus or lung: Secondary | ICD-10-CM

## 2016-08-26 DIAGNOSIS — C3412 Malignant neoplasm of upper lobe, left bronchus or lung: Secondary | ICD-10-CM

## 2016-08-26 DIAGNOSIS — Z5112 Encounter for antineoplastic immunotherapy: Secondary | ICD-10-CM

## 2016-08-26 DIAGNOSIS — Z5111 Encounter for antineoplastic chemotherapy: Secondary | ICD-10-CM

## 2016-08-26 DIAGNOSIS — Z5189 Encounter for other specified aftercare: Secondary | ICD-10-CM

## 2016-08-26 LAB — COMPREHENSIVE METABOLIC PANEL
ALT: 15 U/L (ref 0–55)
ANION GAP: 17 meq/L — AB (ref 3–11)
AST: 13 U/L (ref 5–34)
Albumin: 4 g/dL (ref 3.5–5.0)
Alkaline Phosphatase: 137 U/L (ref 40–150)
BUN: 12.2 mg/dL (ref 7.0–26.0)
CO2: 19 meq/L — AB (ref 22–29)
CREATININE: 0.9 mg/dL (ref 0.6–1.1)
Calcium: 10.2 mg/dL (ref 8.4–10.4)
Chloride: 105 mEq/L (ref 98–109)
EGFR: 73 mL/min/{1.73_m2} — ABNORMAL LOW (ref >90)
GLUCOSE: 207 mg/dL — AB (ref 70–140)
Potassium: 3.8 mEq/L (ref 3.5–5.1)
Sodium: 141 mEq/L (ref 136–145)
TOTAL PROTEIN: 7.8 g/dL (ref 6.4–8.3)

## 2016-08-26 LAB — CBC WITH DIFFERENTIAL/PLATELET
BASO%: 0.3 % (ref 0.0–2.0)
Basophils Absolute: 0 10*3/uL (ref 0.0–0.1)
EOS%: 0 % (ref 0.0–7.0)
Eosinophils Absolute: 0 10*3/uL (ref 0.0–0.5)
HCT: 43.2 % (ref 34.8–46.6)
HEMOGLOBIN: 14.3 g/dL (ref 11.6–15.9)
LYMPH#: 0.5 10*3/uL — AB (ref 0.9–3.3)
LYMPH%: 3.2 % — AB (ref 14.0–49.7)
MCH: 31.1 pg (ref 25.1–34.0)
MCHC: 33.1 g/dL (ref 31.5–36.0)
MCV: 93.8 fL (ref 79.5–101.0)
MONO#: 0.4 10*3/uL (ref 0.1–0.9)
MONO%: 2.4 % (ref 0.0–14.0)
NEUT%: 94.1 % — ABNORMAL HIGH (ref 38.4–76.8)
NEUTROS ABS: 14.4 10*3/uL — AB (ref 1.5–6.5)
PLATELETS: 388 10*3/uL (ref 145–400)
RBC: 4.6 10*6/uL (ref 3.70–5.45)
RDW: 14 % (ref 11.2–14.5)
WBC: 15.3 10*3/uL — AB (ref 3.9–10.3)

## 2016-08-26 LAB — UA PROTEIN, DIPSTICK - CHCC: Protein, ur: <30 mg/dL

## 2016-08-26 MED ORDER — DOCETAXEL CHEMO INJECTION 160 MG/16ML
75.0000 mg/m2 | Freq: Once | INTRAVENOUS | Status: AC
  Administered 2016-08-26: 150 mg via INTRAVENOUS
  Filled 2016-08-26: qty 15

## 2016-08-26 MED ORDER — DEXAMETHASONE SODIUM PHOSPHATE 10 MG/ML IJ SOLN
INTRAMUSCULAR | Status: AC
  Filled 2016-08-26: qty 1

## 2016-08-26 MED ORDER — DIPHENHYDRAMINE HCL 50 MG/ML IJ SOLN
INTRAMUSCULAR | Status: AC
  Filled 2016-08-26: qty 1

## 2016-08-26 MED ORDER — ACETAMINOPHEN 325 MG PO TABS
ORAL_TABLET | ORAL | Status: AC
  Filled 2016-08-26: qty 2

## 2016-08-26 MED ORDER — DIPHENHYDRAMINE HCL 50 MG/ML IJ SOLN
50.0000 mg | Freq: Once | INTRAMUSCULAR | Status: AC
Start: 2016-08-26 — End: 2016-08-26
  Administered 2016-08-26: 50 mg via INTRAVENOUS

## 2016-08-26 MED ORDER — DEXAMETHASONE SODIUM PHOSPHATE 10 MG/ML IJ SOLN
10.0000 mg | Freq: Once | INTRAMUSCULAR | Status: AC
  Administered 2016-08-26: 10 mg via INTRAVENOUS

## 2016-08-26 MED ORDER — ACETAMINOPHEN 325 MG PO TABS
650.0000 mg | ORAL_TABLET | Freq: Once | ORAL | Status: AC
  Administered 2016-08-26: 650 mg via ORAL

## 2016-08-26 MED ORDER — HEPARIN SOD (PORK) LOCK FLUSH 100 UNIT/ML IV SOLN
500.0000 [IU] | Freq: Once | INTRAVENOUS | Status: AC | PRN
  Administered 2016-08-26: 500 [IU]
  Filled 2016-08-26: qty 5

## 2016-08-26 MED ORDER — SODIUM CHLORIDE 0.9 % IV SOLN
Freq: Once | INTRAVENOUS | Status: AC
  Administered 2016-08-26: 14:00:00 via INTRAVENOUS

## 2016-08-26 MED ORDER — PEGFILGRASTIM 6 MG/0.6ML ~~LOC~~ PSKT
6.0000 mg | PREFILLED_SYRINGE | Freq: Once | SUBCUTANEOUS | Status: AC
  Administered 2016-08-26: 6 mg via SUBCUTANEOUS
  Filled 2016-08-26: qty 0.6

## 2016-08-26 MED ORDER — SODIUM CHLORIDE 0.9 % IV SOLN
10.0000 mg/kg | Freq: Once | INTRAVENOUS | Status: AC
  Administered 2016-08-26: 900 mg via INTRAVENOUS
  Filled 2016-08-26: qty 50

## 2016-08-26 MED ORDER — SODIUM CHLORIDE 0.9% FLUSH
10.0000 mL | INTRAVENOUS | Status: DC | PRN
  Administered 2016-08-26: 10 mL
  Filled 2016-08-26: qty 10

## 2016-08-26 NOTE — Progress Notes (Signed)
Patient came to bring income verification and signed applications for assistance([employer]-Cyramza and [employer](Neulasta). Patient also was approved for the one-time $400 [place] that may assist her with out of pocket oral medications at our pharmacy that Dr.Mohamed writes for her as well as gas cards. She states she is living with her mom right now and does not have any bills in her name there, she just buys groceries. Patient given a copy of the approval letter as well as the contact information for our pharmacy.  Faxed Neulasta application to [employer]. Fax received ok per confirmation sheet.   Obtained signatures for [employer] application. Faxed to [employer] Patient Assistance. Fax received ok per confirmation sheet. The turn around time for applications being processed is 3 business days per [place] before decision is made. If patient is approved, they will retro back 180 days. Synthia Innocent in pharmacy of this information.

## 2016-08-26 NOTE — Patient Instructions (Addendum)
[facility] Discharge Instructions for Patients Receiving Chemotherapy  Today you received the following chemotherapy agents: Cyramza and Taxotere.  To help prevent nausea and vomiting after your treatment, we encourage you to take your nausea medication as prescribed.   If you develop nausea and vomiting that is not controlled by your nausea medication, call the clinic.   BELOW ARE SYMPTOMS THAT SHOULD BE REPORTED IMMEDIATELY:  *FEVER GREATER THAN 100.5 F  *CHILLS WITH OR WITHOUT FEVER  NAUSEA AND VOMITING THAT IS NOT CONTROLLED WITH YOUR NAUSEA MEDICATION  *UNUSUAL SHORTNESS OF BREATH  *UNUSUAL BRUISING OR BLEEDING  TENDERNESS IN MOUTH AND THROAT WITH OR WITHOUT PRESENCE OF ULCERS  *URINARY PROBLEMS  *BOWEL PROBLEMS  UNUSUAL RASH Items with * indicate a potential emergency and should be followed up as soon as possible.  Feel free to call the clinic you have any questions or concerns. The clinic phone number is [phone].  Please show the [facility] at check-in to the Emergency Department and triage nurse.  Ramucirumab injection What is this medicine? RAMUCIRUMAB (ra mue SIR ue mab) is a monoclonal antibody. It is used to treat stomach cancer, colorectal cancer, or lung cancer. COMMON BRAND NAME(S): Cyramza What should I tell my health care provider before I take this medicine? They need to know if you have any of these conditions: -bleeding disorders -blood clots -heart disease, including heart failure, heart attack, or chest pain (angina) -high blood pressure -infection (especially a virus infection such as chickenpox, cold sores, or herpes) -protein in your urine -recent surgery -stroke -an unusual or allergic reaction to ramucirumab, other medicines, foods, dyes, or preservatives -pregnant or trying to get pregnant -breast-feeding How should I use this medicine? This medicine is for infusion into a vein. It is given by a health care  professional in a hospital or clinic setting. Talk to your pediatrician regarding the use of this medicine in children. Special care may be needed. What if I miss a dose? It is important not to miss your dose. Call your doctor or health care professional if you are unable to keep an appointment. What may interact with this medicine? Interactions have not been studied. What should I watch for while using this medicine? Your condition will be monitored carefully while you are receiving this medicine. You will need to to check your blood pressure and have your blood and urine tested while you are taking this medicine. Your condition will be monitored carefully while you are receiving this medicine. This medicine may increase your risk to bruise or bleed. Call your doctor or health care professional if you notice any unusual bleeding. This medicine may rarely cause 'gastrointestinal perforation' (holes in the stomach, intestines or colon), a serious side effect requiring surgery to repair. This medicine should be started at least 28 days following major surgery and the site of the surgery should be totally healed. Check with your doctor before scheduling dental work or surgery while you are receiving this treatment. Talk to your doctor if you have recently had surgery or if you have a wound that has not healed. Do not become pregnant while taking this medicine or for 3 months after stopping it. Women should inform their doctor if they wish to become pregnant or think they might be pregnant. There is a potential for serious side effects to an unborn child. Talk to your health care professional or pharmacist for more information. What side effects may I notice from receiving this medicine?  Side effects that you should report to your doctor or health care professional as soon as possible: -allergic reactions like skin rash, itching or hives, breathing problems, swelling of the face, lips, or tongue -signs  of infection - fever or chills, cough, sore throat -chest pain or chest tightness -confusion -dizziness -feeling faint or lightheaded, falls -severe abdominal pain -severe nausea, vomiting -signs and symptoms of bleeding such as bloody or black, tarry stools; red or dark-brown urine; spitting up blood or brown material that looks like coffee grounds; red spots on the skin; unusual bruising or bleeding from the eye, gums, or nose -signs and symptoms of a blood clot such as breathing problems; changes in vision; chest pain; severe, sudden headache; pain, swelling, warmth in the leg; trouble speaking; sudden numbness or weakness of the face, arm or leg -symptoms of a stroke: change in mental awareness, inability to talk or move one side of the body -trouble walking, dizziness, loss of balance or coordination Side effects that usually do not require medical attention (report to your doctor or health care professional if they continue or are bothersome): -cold, clammy skin -constipation -diarrhea -headache -nausea, vomiting -stomach pain -unusually slow heartbeat -unusually weak or tired Where should I keep my medicine? This drug is given in a hospital or clinic and will not be stored at home.  2017 Elsevier/Gold Standard (2015-08-16 08:20:29)  Docetaxel injection What is this medicine? DOCETAXEL (doe se TAX el) is a chemotherapy drug. It targets fast dividing cells, like cancer cells, and causes these cells to die. This medicine is used to treat many types of cancers like breast cancer, certain stomach cancers, head and neck cancer, lung cancer, and prostate cancer. This medicine may be used for other purposes; ask your health care provider or pharmacist if you have questions. COMMON BRAND NAME(S): Docefrez, Taxotere What should I tell my health care provider before I take this medicine? They need to know if you have any of these conditions: -infection (especially a virus infection such  as chickenpox, cold sores, or herpes) -liver disease -low blood counts, like low white cell, platelet, or red cell counts -an unusual or allergic reaction to docetaxel, polysorbate 80, other chemotherapy agents, other medicines, foods, dyes, or preservatives -pregnant or trying to get pregnant -breast-feeding How should I use this medicine? This drug is given as an infusion into a vein. It is administered in a hospital or clinic by a specially trained health care professional. Talk to your pediatrician regarding the use of this medicine in children. Special care may be needed. Overdosage: If you think you have taken too much of this medicine contact a poison control center or emergency room at once. NOTE: This medicine is only for you. Do not share this medicine with others. What if I miss a dose? It is important not to miss your dose. Call your doctor or health care professional if you are unable to keep an appointment. What may interact with this medicine? -cyclosporine -erythromycin -ketoconazole -medicines to increase blood counts like filgrastim, pegfilgrastim, sargramostim -vaccines Talk to your doctor or health care professional before taking any of these medicines: -acetaminophen -aspirin -ibuprofen -ketoprofen -naproxen This list may not describe all possible interactions. Give your health care provider a list of all the medicines, herbs, non-prescription drugs, or dietary supplements you use. Also tell them if you smoke, drink alcohol, or use illegal drugs. Some items may interact with your medicine. What should I watch for while using this medicine? Your condition will  be monitored carefully while you are receiving this medicine. You will need important blood work done while you are taking this medicine. This drug may make you feel generally unwell. This is not uncommon, as chemotherapy can affect healthy cells as well as cancer cells. Report any side effects. Continue your  course of treatment even though you feel ill unless your doctor tells you to stop. In some cases, you may be given additional medicines to help with side effects. Follow all directions for their use. Call your doctor or health care professional for advice if you get a fever, chills or sore throat, or other symptoms of a cold or flu. Do not treat yourself. This drug decreases your body's ability to fight infections. Try to avoid being around people who are sick. This medicine may increase your risk to bruise or bleed. Call your doctor or health care professional if you notice any unusual bleeding. This medicine may contain alcohol in the product. You may get drowsy or dizzy. Do not drive, use machinery, or do anything that needs mental alertness until you know how this medicine affects you. Do not stand or sit up quickly, especially if you are an older patient. This reduces the risk of dizzy or fainting spells. Avoid alcoholic drinks. Do not become pregnant while taking this medicine. Women should inform their doctor if they wish to become pregnant or think they might be pregnant. There is a potential for serious side effects to an unborn child. Talk to your health care professional or pharmacist for more information. Do not breast-feed an infant while taking this medicine. What side effects may I notice from receiving this medicine? Side effects that you should report to your doctor or health care professional as soon as possible: -allergic reactions like skin rash, itching or hives, swelling of the face, lips, or tongue -low blood counts - This drug may decrease the number of white blood cells, red blood cells and platelets. You may be at increased risk for infections and bleeding. -signs of infection - fever or chills, cough, sore throat, pain or difficulty passing urine -signs of decreased platelets or bleeding - bruising, pinpoint red spots on the skin, black, tarry stools, nosebleeds -signs of  decreased red blood cells - unusually weak or tired, fainting spells, lightheadedness -breathing problems -fast or irregular heartbeat -low blood pressure -mouth sores -nausea and vomiting -pain, swelling, redness or irritation at the injection site -pain, tingling, numbness in the hands or feet -swelling of the ankle, feet, hands -weight gain Side effects that usually do not require medical attention (report to your doctor or health care professional if they continue or are bothersome): -bone pain -complete hair loss including hair on your head, underarms, pubic hair, eyebrows, and eyelashes -diarrhea -excessive tearing -changes in the color of fingernails -loosening of the fingernails -nausea -muscle pain -red flush to skin -sweating -weak or tired This list may not describe all possible side effects. Call your doctor for medical advice about side effects. You may report side effects to FDA at 1-800-FDA-1088. Where should I keep my medicine? This drug is given in a hospital or clinic and will not be stored at home. NOTE: This sheet is a summary. It may not cover all possible information. If you have questions about this medicine, talk to your doctor, pharmacist, or health care provider.  2017 Elsevier/Gold Standard (2015-08-16 12:32:56)

## 2016-08-26 NOTE — Progress Notes (Signed)
Called patient to ask her if she is able to bring proof of her income to her appointment today to apply for assistance with Cyramza through [place]. Patient states she can bring today. She will meet with me in between her lab and infusion.

## 2016-08-26 NOTE — Progress Notes (Signed)
BMS 370 Patient in clinic today for start of new treatment. I saw patient and provided her with a copy of her signed consent for Group A, Version 4 dated 04/14/2016, as agreed upon at her last visit on 08/18/16. Patient denies any questions at this time and was encouraged to call Dr. Julien Nordmann or myself with any questions or concerns.  Adele Dan, RN, BSN Clinical Research 08/26/2016 2:42 PM

## 2016-08-26 NOTE — Progress Notes (Signed)
Patient tolerated treatment well. Onpro and discharge instructions reviewed with patient, patient verbalized understanding. Patient stable upon discharge.

## 2016-08-28 ENCOUNTER — Encounter: Payer: Self-pay | Admitting: Internal Medicine

## 2016-08-28 NOTE — Progress Notes (Unsigned)
Received approval letter from [employer] for [employer]. Patient approved 08/28/16-08/28/17.    Had Brandy in pharmacy to complete dose tracking log for 08/26/16 dose and physician to sign. Faxed to [employer] patient assistance. Fax received ok per confirmation sheet.

## 2016-08-29 ENCOUNTER — Telehealth: Payer: Self-pay | Admitting: *Deleted

## 2016-08-29 ENCOUNTER — Telehealth: Payer: Self-pay | Admitting: Medical Oncology

## 2016-08-29 DIAGNOSIS — C3412 Malignant neoplasm of upper lobe, left bronchus or lung: Secondary | ICD-10-CM

## 2016-08-29 MED ORDER — PROCHLORPERAZINE MALEATE 10 MG PO TABS: 10.0000 mg | ORAL_TABLET | Freq: Four times a day (QID) | ORAL | 0 refills | Status: DC | PRN

## 2016-08-29 MED ORDER — ONDANSETRON HCL 8 MG PO TABS: 8.0000 mg | ORAL_TABLET | Freq: Three times a day (TID) | ORAL | 0 refills | Status: DC | PRN

## 2016-08-29 NOTE — Telephone Encounter (Signed)
Follow up call to patient regarding start of her new chemotherapy treatment on 08/26/16 with Cyramza and Taxotere. Patient had asked me on the 30th to call and check on her today. Patient reporting to be having "lots of nausea" and having difficulty eating due to the nausea. Patient denies any vomiting. Patient reports to not have any anti-emetic and was not given a prescription. Patient asking if she could have a prescription for anti nausea medication sent to her [facility] on [address] . Informed patient will send message to Dr. Worthy Flank nurse. Patient thanked me. I encouraged patient to call clinic should her symptoms worsen. Patient expressed understanding, denies other needs at this time. VM message left with desk nurse.  Adele Dan, RN, BSN Clinical Research 08/29/2016 4:13 PM

## 2016-08-29 NOTE — Telephone Encounter (Signed)
Research nurse reports patient having nausea.  No emesis and needs anti-emetics.  Verbal order received from on call provider for zofran, compazine and to call Monday for F/U and possible IVF.  Orders sent to Pharmacy and patient notified.

## 2016-09-01 NOTE — Telephone Encounter (Signed)
"  I took the Zofran and compazine.  No further nausea.  I drink 64 oz water, Gatorade and sprite.  No nausea or vomiting.  I slept all night Saturday and all day Sunday.  The Compazine makes you sleepy.  I only took one compazine.  I am having extreme constipation.  Yesterday's BM looked like acorns.  Last normal, soft BM was last Wednesday.  I took stool softeners throughout the day yesterday is why I had BM yesterday.  I passed a lot of gas.  My stomach grumbles so loud my daughter heard it across the room.  No cramping, gas or bloating."    Reviewed Triage constipation guidelines with patient.  Take stool softeners daily, try Milk of Alto.  If no relief try dulcolax.  Increase fluid intake, increase activity and hot beverages (broth, herbal teas, coffee, Prune juice, apple juice, wheat breads, brown rice)

## 2016-09-02 ENCOUNTER — Encounter: Payer: Self-pay | Admitting: Internal Medicine

## 2016-09-02 NOTE — Progress Notes (Signed)
Pt is approved w/ [employer] to receive Neulasta free of charge from 08/30/16 to 08/30/17.  I completed the product replacement form for 08/26/16 dos.

## 2016-09-04 ENCOUNTER — Telehealth: Payer: Self-pay

## 2016-09-04 NOTE — Telephone Encounter (Signed)
Pt called stating she has been having nose bleeds for the last 2 days when she blows her nose. It will take about 2 tissues to stop. She has been using saline nasal spray. There is no blood in urine or stool. She had cyramza and taxotere on 1/30. S/w Dr Julien Nordmann and pt was supposed to have weekly labs. Arranged lab for tomorrow and instructed pt to remain in lobby for results. Arranged lab appt for next week.

## 2016-09-05 ENCOUNTER — Other Ambulatory Visit (HOSPITAL_BASED_OUTPATIENT_CLINIC_OR_DEPARTMENT_OTHER): Payer: Self-pay

## 2016-09-05 DIAGNOSIS — C3492 Malignant neoplasm of unspecified part of left bronchus or lung: Secondary | ICD-10-CM

## 2016-09-05 DIAGNOSIS — C3412 Malignant neoplasm of upper lobe, left bronchus or lung: Secondary | ICD-10-CM

## 2016-09-05 LAB — CBC WITH DIFFERENTIAL/PLATELET
BASO%: 0.6 % (ref 0.0–2.0)
Basophils Absolute: 0.1 10*3/uL (ref 0.0–0.1)
EOS ABS: 0 10*3/uL (ref 0.0–0.5)
EOS%: 0.1 % (ref 0.0–7.0)
HCT: 41.3 % (ref 34.8–46.6)
HEMOGLOBIN: 13.6 g/dL (ref 11.6–15.9)
LYMPH%: 9.7 % — ABNORMAL LOW (ref 14.0–49.7)
MCH: 30.8 pg (ref 25.1–34.0)
MCHC: 32.9 g/dL (ref 31.5–36.0)
MCV: 93.4 fL (ref 79.5–101.0)
MONO#: 1.2 10*3/uL — ABNORMAL HIGH (ref 0.1–0.9)
MONO%: 8.3 % (ref 0.0–14.0)
NEUT%: 81.3 % — ABNORMAL HIGH (ref 38.4–76.8)
NEUTROS ABS: 11.3 10*3/uL — AB (ref 1.5–6.5)
Platelets: 200 10*3/uL (ref 145–400)
RBC: 4.42 10*6/uL (ref 3.70–5.45)
RDW: 14.4 % (ref 11.2–14.5)
WBC: 13.9 10*3/uL — AB (ref 3.9–10.3)
lymph#: 1.4 10*3/uL (ref 0.9–3.3)

## 2016-09-05 LAB — COMPREHENSIVE METABOLIC PANEL
ALT: 11 U/L (ref 0–55)
AST: 11 U/L (ref 5–34)
Albumin: 3.2 g/dL — ABNORMAL LOW (ref 3.5–5.0)
Alkaline Phosphatase: 146 U/L (ref 40–150)
Anion Gap: 10 mEq/L (ref 3–11)
BUN: 7.3 mg/dL (ref 7.0–26.0)
CO2: 26 mEq/L (ref 22–29)
Calcium: 9.7 mg/dL (ref 8.4–10.4)
Chloride: 105 mEq/L (ref 98–109)
Creatinine: 0.7 mg/dL (ref 0.6–1.1)
EGFR: >90 mL/min/{1.73_m2} (ref >90)
Glucose: 109 mg/dl (ref 70–140)
Potassium: 4.3 mEq/L (ref 3.5–5.1)
Sodium: 141 mEq/L (ref 136–145)
Total Bilirubin: <0.22 mg/dL (ref 0.20–1.20)
Total Protein: 6.7 g/dL (ref 6.4–8.3)

## 2016-09-09 NOTE — Progress Notes (Signed)
Received 4 x 100 mg and 1 x 500 mg vial of Cyramza from [facility].  These vials replace our stock used for treatment on 08/26/16.  Henreitta Leber, PharmD

## 2016-09-10 ENCOUNTER — Other Ambulatory Visit (HOSPITAL_BASED_OUTPATIENT_CLINIC_OR_DEPARTMENT_OTHER): Payer: Self-pay

## 2016-09-10 DIAGNOSIS — C3412 Malignant neoplasm of upper lobe, left bronchus or lung: Secondary | ICD-10-CM

## 2016-09-10 DIAGNOSIS — C3492 Malignant neoplasm of unspecified part of left bronchus or lung: Secondary | ICD-10-CM

## 2016-09-10 LAB — CBC WITH DIFFERENTIAL/PLATELET
BASO%: 2 % (ref 0.0–2.0)
BASOS ABS: 0.2 10*3/uL — AB (ref 0.0–0.1)
EOS ABS: 0 10*3/uL (ref 0.0–0.5)
EOS%: 0.3 % (ref 0.0–7.0)
HCT: 42.2 % (ref 34.8–46.6)
HEMOGLOBIN: 13.8 g/dL (ref 11.6–15.9)
LYMPH%: 15.3 % (ref 14.0–49.7)
MCH: 30.5 pg (ref 25.1–34.0)
MCHC: 32.7 g/dL (ref 31.5–36.0)
MCV: 93.4 fL (ref 79.5–101.0)
MONO#: 0.6 10*3/uL (ref 0.1–0.9)
MONO%: 7.7 % (ref 0.0–14.0)
NEUT%: 74.7 % (ref 38.4–76.8)
NEUTROS ABS: 5.7 10*3/uL (ref 1.5–6.5)
PLATELETS: 333 10*3/uL (ref 145–400)
RBC: 4.52 10*6/uL (ref 3.70–5.45)
RDW: 14.7 % — AB (ref 11.2–14.5)
WBC: 7.7 10*3/uL (ref 3.9–10.3)
lymph#: 1.2 10*3/uL (ref 0.9–3.3)

## 2016-09-10 LAB — COMPREHENSIVE METABOLIC PANEL
ALT: 10 U/L (ref 0–55)
AST: 10 U/L (ref 5–34)
Albumin: 3.5 g/dL (ref 3.5–5.0)
Alkaline Phosphatase: 118 U/L (ref 40–150)
Anion Gap: 12 mEq/L — ABNORMAL HIGH (ref 3–11)
BUN: 12.8 mg/dL (ref 7.0–26.0)
CO2: 22 mEq/L (ref 22–29)
Calcium: 9.9 mg/dL (ref 8.4–10.4)
Chloride: 105 mEq/L (ref 98–109)
Creatinine: 0.7 mg/dL (ref 0.6–1.1)
EGFR: >90 mL/min/{1.73_m2} (ref >90)
Glucose: 104 mg/dl (ref 70–140)
Potassium: 4 mEq/L (ref 3.5–5.1)
Sodium: 140 mEq/L (ref 136–145)
Total Bilirubin: 0.22 mg/dL (ref 0.20–1.20)
Total Protein: 7.3 g/dL (ref 6.4–8.3)

## 2016-09-15 ENCOUNTER — Encounter: Payer: Self-pay | Admitting: Family Medicine

## 2016-09-15 ENCOUNTER — Ambulatory Visit (INDEPENDENT_AMBULATORY_CARE_PROVIDER_SITE_OTHER): Payer: Self-pay | Admitting: Family Medicine

## 2016-09-15 DIAGNOSIS — I10 Essential (primary) hypertension: Secondary | ICD-10-CM

## 2016-09-15 MED ORDER — AMLODIPINE BESYLATE 5 MG PO TABS: 5.0000 mg | ORAL_TABLET | Freq: Every day | ORAL | 1 refills | Status: AC

## 2016-09-15 NOTE — Progress Notes (Signed)
Heather Banks, is a 66 y.o. female  VHQ:[id number]  UXL:[id number]  DOB - 1951/01/20  CC:  Chief Complaint  Patient presents with  . emotional today    loosing hair due to chemo for lung cancer  . Sore Throat    using OTC saline solution, has seen Dr Inda Merlin about this, all labs WNL per patient  . nosebleeds    had nosebleeds for 8 days worried about cause       HPI: Heather Banks is a 66 y.o. female here for follow-up hypertension.  Mrs. Knies has stage 4 lung cancer. She has just within the last few weeks re- started chemotherapy. A couple of weeks ago she has nose bleeds daily for several days. Her oncologist did bloodwork and everything looked ok. She does admit to living in a very dry environment. She has no other  Complaints today except for hair loss related to chemo. Her treatment at this point is considered pallative.  Health Maintenance: immunizations are up to date.    Allergies  Allergen Reactions  . Ativan [Lorazepam] Other (See Comments)    Reaction:  Makes pt angry - opposite reaction of med should do  . Cinnamon Other (See Comments)    Seasonal scent trigger migraine as well as pt's overall immune system   Past Medical History:  Diagnosis Date  . Adenocarcinoma of left lung, stage 4 ([facility]) 07/09/2014  . Compression fracture of thoracic vertebra ([facility]) 04/21/2016  . COPD (chronic obstructive pulmonary disease) (HCC)    Albuterol neb as needed;Pulmicort neb daily  . Encounter for antineoplastic chemotherapy 01/21/2015  . Full code status 05/01/2015  . GERD (gastroesophageal reflux disease)    takes Pantoprazole and Zantac daily  . History of bronchitis    >5 yrs ago  . History of migraine    last one 2 wks ago  . Hx of radiation therapy 10/29/2015 to 11/16/2015   The Left upper lobe was treated to 37.5 Gy in 15 fractions at 2.5 Gy per fraction  . Lung mass   . Panic attacks    but doesn't take any meds  . Pneumonia    hx of-last time about 4+yrs ago   Current  Outpatient Prescriptions on File Prior to Visit  Medication Sig Dispense Refill  . albuterol (PROVENTIL) (2.5 MG/3ML) 0.083% nebulizer solution Take 3 mLs (2.5 mg total) by nebulization every 6 (six) hours as needed for wheezing or shortness of breath. 75 mL 3  . ALPRAZolam (XANAX) 0.25 MG tablet Take 1 tablet (0.25 mg total) by mouth 2 (two) times daily as needed for anxiety. 40 tablet 0  . aspirin-acetaminophen-caffeine (EXCEDRIN MIGRAINE) 536-644-03 MG per tablet Take 2 tablets by mouth every 6 (six) hours as needed for headache.    Marland Kitchen BREO ELLIPTA 100-25 MCG/INH AEPB INHALE 1 PUFF INTO THE LUNGS DAILY 60 each 5  . calcium carbonate (OS-CAL - DOSED IN MG OF ELEMENTAL CALCIUM) 1250 (500 Ca) MG tablet Take 1 tablet by mouth daily with breakfast.    . cholecalciferol (VITAMIN D) 1000 units tablet Take 1,000 Units by mouth daily.    . cyanocobalamin 2000 MCG tablet Take 2,000 mcg by mouth daily.    Marland Kitchen dexamethasone (DECADRON) 4 MG tablet 2 tablets by mouth twice a day the day before, day of and day after the chemotherapy every 3 weeks. 40 tablet 1  . Lidocaine (ASPERCREME LIDOCAINE) 4 % PTCH Apply 1 patch topically daily as needed. Place on back as needed for pain    .  loratadine (CLARITIN) 10 MG tablet Take 1 tablet (10 mg total) by mouth daily. 15 tablet 0  . ondansetron (ZOFRAN) 8 MG tablet Take 1 tablet (8 mg total) by mouth every 8 (eight) hours as needed for nausea or vomiting. 20 tablet 0  . prochlorperazine (COMPAZINE) 10 MG tablet Take 1 tablet (10 mg total) by mouth every 6 (six) hours as needed for nausea or vomiting. 60 tablet 0  . SPIRIVA HANDIHALER 18 MCG inhalation capsule PLACE 1 CAPSULE INTO THE INHALER AND INHALE EVERY DAY 30 capsule 5  . Tiotropium Bromide Monohydrate (SPIRIVA RESPIMAT) 2.5 MCG/ACT AERS Inhale 2 puffs into the lungs daily. 1 Inhaler 0  . VENTOLIN HFA 108 (90 Base) MCG/ACT inhaler INHALE 2 PUFFS BY MOUTH EVERY 6 HOURS AS NEEDED FOR WHEEZE OR SHORTNESS OF BREATH 18 g 5   . acidophilus (RISAQUAD) CAPS capsule Take 1 capsule by mouth daily.    . diphenhydrAMINE (BENADRYL) 25 mg capsule Take 25 mg by mouth every 6 (six) hours as needed for itching. Reported on 01/19/2016    . famotidine (PEPCID) 20 MG tablet Take 1 tablet (20 mg total) by mouth daily. (Patient not taking: Reported on 07/14/2016) 15 tablet 0  . mirtazapine (REMERON) 30 MG tablet Take 15 mg by mouth at bedtime as needed (for sleep).      Current Facility-Administered Medications on File Prior to Visit  Medication Dose Route Frequency Provider Last Rate Last Dose  . heparin lock flush 100 unit/mL  500 Units Intracatheter Once PRN Curt Bears, MD      . sodium chloride 0.9 % injection 10 mL  10 mL Intracatheter PRN Curt Bears, MD   10 mL at 07/31/15 1635  . sodium chloride 0.9 % injection 10 mL  10 mL Intracatheter PRN Curt Bears, MD       Family History  Problem Relation Age of Onset  . COPD Father    Social History   Social History  . Marital status: Widowed    Spouse name: N/A  . Number of children: N/A  . Years of education: N/A   Occupational History  . Not on file.   Social History Main Topics  . Smoking status: Former Smoker    Packs/day: 0.25    Years: 44.00    Types: Cigarettes    Quit date: 03/14/2016  . Smokeless tobacco: Never Used  . Alcohol use No     Comment: no alcohol in 7 yrs   . Drug use: No  . Sexual activity: Not on file   Other Topics Concern  . Not on file   Social History Narrative  . No narrative on file    Review of Systems: Constitutional: Negative Skin: Negative HENT: Negative  Eyes: Negative  Neck: Negative Respiratory: + for shortness of breath, coughing wheezing. Cardiovascular: Negative Gastrointestinal: Negative Genitourinary: Negative  Musculoskeletal: + for back pain Neurological: Negative for Hematological: + for recent nosebleeds Psychiatric/Behavioral: + depression and anxiety   Objective:   Vitals:    09/15/16 1040  BP: 128/82  Pulse: 94  Resp: 14  Temp: 97.9 F (36.6 C)    Physical Exam: Constitutional: Patient appears well-developed and well-nourished. No distress. HENT: Normocephalic, atraumatic, External right and left ear normal. Oropharynx is clear and moist. Hairloss present Eyes: Conjunctivae and EOM are normal. PERRLA, no scleral icterus. Neck: Normal ROM. Neck supple. No lymphadenopathy, No thyromegaly. CVS: RRR, S1/S2 +, no murmurs, no gallops, no rubs Pulmonary: Effort normal, scattered rhonchi   Abdominal: Soft.  Normoactive BS,, no distension, tenderness, rebound or guarding.  Musculoskeletal: Normal range of motion. No edema and no tenderness.  Neuro: Alert.Normal muscle tone coordination. Non-focal Skin: Skin is warm and dry. No rash noted. Not diaphoretic. No erythema. No pallor. Psychiatric: Normal mood and affect. Behavior, judgment, thought content normal.  Lab Results  Component Value Date   WBC 7.7 09/10/2016   HGB 13.8 09/10/2016   HCT 42.2 09/10/2016   MCV 93.4 09/10/2016   PLT 333 09/10/2016   Lab Results  Component Value Date   CREATININE 0.7 09/10/2016   BUN 12.8 09/10/2016   NA 140 09/10/2016   K 4.0 09/10/2016   CL 98 (L) 05/21/2016   CO2 22 09/10/2016    Lab Results  Component Value Date   HGBA1C 5.6 05/18/2016   Lipid Panel     Component Value Date/Time   CHOL 261 (H) 02/26/2016 0917   TRIG 185 (H) 02/26/2016 0917   HDL 52 02/26/2016 0917   CHOLHDL 5.0 02/26/2016 0917   VLDL 37 (H) 02/26/2016 0917   LDLCALC 172 (H) 02/26/2016 0917        Assessment and plan:   1. Essential hypertension  - amLODipine (NORVASC) 5 MG tablet; Take 1 tablet (5 mg total) by mouth daily.  Dispense: 90 tablet; Refill: 1   Return in about 6 months (around 03/15/2017).  The patient was given clear instructions to go to ER or return to medical center if symptoms don't improve, worsen or new problems develop. The patient verbalized understanding.     Micheline Chapman FNP  09/15/2016, 11:59 AM

## 2016-09-15 NOTE — Patient Instructions (Signed)
Follow-up in six months. Sooner if needed.

## 2016-09-16 ENCOUNTER — Encounter: Payer: Self-pay | Admitting: Internal Medicine

## 2016-09-16 ENCOUNTER — Other Ambulatory Visit: Payer: Self-pay | Admitting: Internal Medicine

## 2016-09-16 ENCOUNTER — Telehealth: Payer: Self-pay | Admitting: *Deleted

## 2016-09-16 ENCOUNTER — Ambulatory Visit (HOSPITAL_BASED_OUTPATIENT_CLINIC_OR_DEPARTMENT_OTHER): Payer: Self-pay | Admitting: Internal Medicine

## 2016-09-16 ENCOUNTER — Other Ambulatory Visit (HOSPITAL_BASED_OUTPATIENT_CLINIC_OR_DEPARTMENT_OTHER): Payer: Self-pay

## 2016-09-16 ENCOUNTER — Ambulatory Visit (HOSPITAL_BASED_OUTPATIENT_CLINIC_OR_DEPARTMENT_OTHER): Payer: Self-pay

## 2016-09-16 ENCOUNTER — Telehealth: Payer: Self-pay | Admitting: Internal Medicine

## 2016-09-16 VITALS — BP 121/91 | HR 130 | Temp 98.4°F | Resp 21 | Ht 65.0 in | Wt 195.7 lb

## 2016-09-16 DIAGNOSIS — C3492 Malignant neoplasm of unspecified part of left bronchus or lung: Secondary | ICD-10-CM

## 2016-09-16 DIAGNOSIS — F4323 Adjustment disorder with mixed anxiety and depressed mood: Secondary | ICD-10-CM

## 2016-09-16 DIAGNOSIS — Z5111 Encounter for antineoplastic chemotherapy: Secondary | ICD-10-CM

## 2016-09-16 DIAGNOSIS — C3412 Malignant neoplasm of upper lobe, left bronchus or lung: Secondary | ICD-10-CM

## 2016-09-16 DIAGNOSIS — Z5112 Encounter for antineoplastic immunotherapy: Secondary | ICD-10-CM

## 2016-09-16 LAB — CBC WITH DIFFERENTIAL/PLATELET
BASO%: 0.3 % (ref 0.0–2.0)
BASOS ABS: 0 10*3/uL (ref 0.0–0.1)
EOS ABS: 0 10*3/uL (ref 0.0–0.5)
EOS%: 0 % (ref 0.0–7.0)
HEMATOCRIT: 40.1 % (ref 34.8–46.6)
HGB: 13.4 g/dL (ref 11.6–15.9)
LYMPH#: 0.5 10*3/uL — AB (ref 0.9–3.3)
LYMPH%: 4.6 % — AB (ref 14.0–49.7)
MCH: 31.1 pg (ref 25.1–34.0)
MCHC: 33.4 g/dL (ref 31.5–36.0)
MCV: 93.1 fL (ref 79.5–101.0)
MONO#: 0.5 10*3/uL (ref 0.1–0.9)
MONO%: 4.1 % (ref 0.0–14.0)
NEUT#: 10.2 10*3/uL — ABNORMAL HIGH (ref 1.5–6.5)
NEUT%: 91 % — AB (ref 38.4–76.8)
PLATELETS: 472 10*3/uL — AB (ref 145–400)
RBC: 4.3 10*6/uL (ref 3.70–5.45)
RDW: 15.2 % — ABNORMAL HIGH (ref 11.2–14.5)
WBC: 11.2 10*3/uL — ABNORMAL HIGH (ref 3.9–10.3)

## 2016-09-16 LAB — COMPREHENSIVE METABOLIC PANEL
ALT: 14 U/L (ref 0–55)
ANION GAP: 16 meq/L — AB (ref 3–11)
AST: 10 U/L (ref 5–34)
Albumin: 3.6 g/dL (ref 3.5–5.0)
Alkaline Phosphatase: 127 U/L (ref 40–150)
BUN: 15.1 mg/dL (ref 7.0–26.0)
CALCIUM: 9.5 mg/dL (ref 8.4–10.4)
CHLORIDE: 105 meq/L (ref 98–109)
CO2: 18 meq/L — AB (ref 22–29)
CREATININE: 0.8 mg/dL (ref 0.6–1.1)
EGFR: 78 mL/min/{1.73_m2} — AB (ref >90)
Glucose: 191 mg/dl — ABNORMAL HIGH (ref 70–140)
Potassium: 4.2 mEq/L (ref 3.5–5.1)
Sodium: 139 mEq/L (ref 136–145)
Total Protein: 7.3 g/dL (ref 6.4–8.3)

## 2016-09-16 LAB — UA PROTEIN, DIPSTICK - CHCC: PROTEIN: NEGATIVE mg/dL

## 2016-09-16 MED ORDER — DIPHENHYDRAMINE HCL 50 MG/ML IJ SOLN
INTRAMUSCULAR | Status: AC
  Filled 2016-09-16: qty 1

## 2016-09-16 MED ORDER — ACETAMINOPHEN 325 MG PO TABS
ORAL_TABLET | ORAL | Status: AC
  Filled 2016-09-16: qty 2

## 2016-09-16 MED ORDER — MIRTAZAPINE 30 MG PO TABS: 15.0000 mg | ORAL_TABLET | Freq: Every evening | ORAL | 2 refills | Status: DC | PRN

## 2016-09-16 MED ORDER — ALPRAZOLAM 0.25 MG PO TABS: 0.2500 mg | ORAL_TABLET | Freq: Two times a day (BID) | ORAL | 0 refills | Status: DC | PRN

## 2016-09-16 MED ORDER — DOCETAXEL CHEMO INJECTION 160 MG/16ML
75.0000 mg/m2 | Freq: Once | INTRAVENOUS | Status: AC
  Administered 2016-09-16: 150 mg via INTRAVENOUS
  Filled 2016-09-16: qty 15

## 2016-09-16 MED ORDER — DEXAMETHASONE SODIUM PHOSPHATE 10 MG/ML IJ SOLN
INTRAMUSCULAR | Status: AC
  Filled 2016-09-16: qty 1

## 2016-09-16 MED ORDER — SODIUM CHLORIDE 0.9 % IV SOLN
Freq: Once | INTRAVENOUS | Status: AC
  Administered 2016-09-16: 12:00:00 via INTRAVENOUS

## 2016-09-16 MED ORDER — RAMUCIRUMAB CHEMO INJECTION 500 MG/50ML
10.0000 mg/kg | Freq: Once | INTRAVENOUS | Status: AC
  Administered 2016-09-16: 900 mg via INTRAVENOUS
  Filled 2016-09-16: qty 40

## 2016-09-16 MED ORDER — ACETAMINOPHEN 325 MG PO TABS
650.0000 mg | ORAL_TABLET | Freq: Once | ORAL | Status: AC
  Administered 2016-09-16: 650 mg via ORAL

## 2016-09-16 MED ORDER — HEPARIN SOD (PORK) LOCK FLUSH 100 UNIT/ML IV SOLN
500.0000 [IU] | Freq: Once | INTRAVENOUS | Status: AC | PRN
  Administered 2016-09-16: 500 [IU]
  Filled 2016-09-16: qty 5

## 2016-09-16 MED ORDER — DEXAMETHASONE SODIUM PHOSPHATE 10 MG/ML IJ SOLN
10.0000 mg | Freq: Once | INTRAMUSCULAR | Status: AC
  Administered 2016-09-16: 10 mg via INTRAVENOUS

## 2016-09-16 MED ORDER — SODIUM CHLORIDE 0.9% FLUSH
10.0000 mL | INTRAVENOUS | Status: DC | PRN
  Administered 2016-09-16: 10 mL
  Filled 2016-09-16: qty 10

## 2016-09-16 MED ORDER — PEGFILGRASTIM 6 MG/0.6ML ~~LOC~~ PSKT
6.0000 mg | PREFILLED_SYRINGE | Freq: Once | SUBCUTANEOUS | Status: AC
  Administered 2016-09-16: 6 mg via SUBCUTANEOUS
  Filled 2016-09-16: qty 0.6

## 2016-09-16 MED ORDER — DIPHENHYDRAMINE HCL 50 MG/ML IJ SOLN
50.0000 mg | Freq: Once | INTRAMUSCULAR | Status: AC
  Administered 2016-09-16: 50 mg via INTRAVENOUS

## 2016-09-16 MED FILL — ALPRAZolam 0.25 MG TABS: 0.25 | 20 days supply | Qty: 40 | Fill #0

## 2016-09-16 MED FILL — MIRTAZAPINE 30 MG TABLET: 30 | 60 days supply | Qty: 30 | Fill #0

## 2016-09-16 NOTE — Patient Instructions (Addendum)
[facility] Discharge Instructions for Patients Receiving Chemotherapy  Today you received the following chemotherapy agents: Cyramza and Taxotere.  To help prevent nausea and vomiting after your treatment, we encourage you to take your nausea medication as prescribed.   If you develop nausea and vomiting that is not controlled by your nausea medication, call the clinic.   BELOW ARE SYMPTOMS THAT SHOULD BE REPORTED IMMEDIATELY:  *FEVER GREATER THAN 100.5 F  *CHILLS WITH OR WITHOUT FEVER  NAUSEA AND VOMITING THAT IS NOT CONTROLLED WITH YOUR NAUSEA MEDICATION  *UNUSUAL SHORTNESS OF BREATH  *UNUSUAL BRUISING OR BLEEDING  TENDERNESS IN MOUTH AND THROAT WITH OR WITHOUT PRESENCE OF ULCERS  *URINARY PROBLEMS  *BOWEL PROBLEMS  UNUSUAL RASH Items with * indicate a potential emergency and should be followed up as soon as possible.  Feel free to call the clinic you have any questions or concerns. The clinic phone number is [phone].  Please show the [facility] at check-in to the Emergency Department and triage nurse.

## 2016-09-16 NOTE — Progress Notes (Signed)
Per Dr Julien Nordmann is is Okay to treat pt today with elevated heart rate

## 2016-09-16 NOTE — Telephone Encounter (Signed)
Per 2/20 LOS and staff message I have scheduled appts. Gave calendar to patient

## 2016-09-16 NOTE — Progress Notes (Signed)
[facility] [phone]   [phone]  OFFICE PROGRESS NOTE  Heather Seller, NP [address] [place] [address]  DIAGNOSIS: Stage IV (T2a, N0, M1a) non-small cell lung cancer, adenocarcinoma presented with bilateral pulmonary lesions. This could be also consider as synchronous primary tumors.  PRIOR THERAPY:  1) Systemic chemotherapy with carboplatin for AUC of 5 and Alimta 500 MG/M2 every 3 weeks. Status post 6 cycles. 2) Treatment according to the BMS checkmate 370 clinical trial. Randomized to group A arm B1 treatment with pemetrexed (Alimta) 500 mg/m given every 3 weeks. Status post 6 cycles discontinued secondary to disease progression. 3) Tecentriq 1200 mg IV every 3 weeks status post 6 cycles discontinued secondary to significant skin rash. 4) palliative radiotherapy to the left upper lobe lung mass under the care of Dr. Lisbeth Renshaw completed on 11/16/2015.  CURRENT THERAPY: Systemic chemotherapy with docetaxel 75 MG/M2 and Cyramza 10 MG/KG every 3 weeks with Neulasta support. First dose 08/25/2016. Status post one cycle.  INTERVAL HISTORY: Heather Banks 66 y.o. female came to the clinic today for follow-up visit. The patient was started on treatment with docetaxel and Cyramza status post one cycle. She tolerated the first cycle of her treatment well except for alopecia and mild nausea. She is very anxious. She denied having any chest pain, shortness of breath, cough or hemoptysis. She has no weight loss or night sweats. She denied having any fever or chills. She is here today for evaluation before starting cycle #2. She is requesting refill of Remeron and Xanax.    MEDICAL HISTORY: Past Medical History:  Diagnosis Date  . Adenocarcinoma of left lung, stage 4 ([facility]) 07/09/2014  . Compression fracture of thoracic vertebra (HCC) 04/21/2016  . COPD (chronic obstructive pulmonary disease) (HCC)    Albuterol neb as needed;Pulmicort neb daily  .  Encounter for antineoplastic chemotherapy 01/21/2015  . Full code status 05/01/2015  . GERD (gastroesophageal reflux disease)    takes Pantoprazole and Zantac daily  . History of bronchitis    >5 yrs ago  . History of migraine    last one 2 wks ago  . Hx of radiation therapy 10/29/2015 to 11/16/2015   The Left upper lobe was treated to 37.5 Gy in 15 fractions at 2.5 Gy per fraction  . Lung mass   . Panic attacks    but doesn't take any meds  . Pneumonia    hx of-last time about 4+yrs ago    ALLERGIES:  is allergic to ativan [lorazepam] and cinnamon.  MEDICATIONS:  Current Outpatient Prescriptions  Medication Sig Dispense Refill  . acidophilus (RISAQUAD) CAPS capsule Take 1 capsule by mouth daily.    Marland Kitchen albuterol (PROVENTIL) (2.5 MG/3ML) 0.083% nebulizer solution Take 3 mLs (2.5 mg total) by nebulization every 6 (six) hours as needed for wheezing or shortness of breath. 75 mL 3  . ALPRAZolam (XANAX) 0.25 MG tablet Take 1 tablet (0.25 mg total) by mouth 2 (two) times daily as needed for anxiety. 40 tablet 0  . amLODipine (NORVASC) 5 MG tablet Take 1 tablet (5 mg total) by mouth daily. 90 tablet 1  . aspirin-acetaminophen-caffeine (EXCEDRIN MIGRAINE) 706-237-62 MG per tablet Take 2 tablets by mouth every 6 (six) hours as needed for headache.    Marland Kitchen BREO ELLIPTA 100-25 MCG/INH AEPB INHALE 1 PUFF INTO THE LUNGS DAILY 60 each 5  . calcium carbonate (OS-CAL - DOSED IN MG OF ELEMENTAL CALCIUM) 1250 (500 Ca) MG tablet Take 1  tablet by mouth daily with breakfast.    . cholecalciferol (VITAMIN D) 1000 units tablet Take 1,000 Units by mouth daily.    . cyanocobalamin 2000 MCG tablet Take 2,000 mcg by mouth daily.    Marland Kitchen dexamethasone (DECADRON) 4 MG tablet 2 tablets by mouth twice a day the day before, day of and day after the chemotherapy every 3 weeks. 40 tablet 1  . diphenhydrAMINE (BENADRYL) 25 mg capsule Take 25 mg by mouth every 6 (six) hours as needed for itching. Reported on 01/19/2016    .  famotidine (PEPCID) 20 MG tablet Take 1 tablet (20 mg total) by mouth daily. (Patient not taking: Reported on 07/14/2016) 15 tablet 0  . Lidocaine (ASPERCREME LIDOCAINE) 4 % PTCH Apply 1 patch topically daily as needed. Place on back as needed for pain    . loratadine (CLARITIN) 10 MG tablet Take 1 tablet (10 mg total) by mouth daily. 15 tablet 0  . mirtazapine (REMERON) 30 MG tablet Take 15 mg by mouth at bedtime as needed (for sleep).     . ondansetron (ZOFRAN) 8 MG tablet Take 1 tablet (8 mg total) by mouth every 8 (eight) hours as needed for nausea or vomiting. 20 tablet 0  . prochlorperazine (COMPAZINE) 10 MG tablet Take 1 tablet (10 mg total) by mouth every 6 (six) hours as needed for nausea or vomiting. 60 tablet 0  . SPIRIVA HANDIHALER 18 MCG inhalation capsule PLACE 1 CAPSULE INTO THE INHALER AND INHALE EVERY DAY 30 capsule 5  . Tiotropium Bromide Monohydrate (SPIRIVA RESPIMAT) 2.5 MCG/ACT AERS Inhale 2 puffs into the lungs daily. 1 Inhaler 0  . VENTOLIN HFA 108 (90 Base) MCG/ACT inhaler INHALE 2 PUFFS BY MOUTH EVERY 6 HOURS AS NEEDED FOR WHEEZE OR SHORTNESS OF BREATH 18 g 5   No current facility-administered medications for this visit.    Facility-Administered Medications Ordered in Other Visits  Medication Dose Route Frequency Provider Last Rate Last Dose  . heparin lock flush 100 unit/mL  500 Units Intracatheter Once PRN Curt Bears, MD      . sodium chloride 0.9 % injection 10 mL  10 mL Intracatheter PRN Curt Bears, MD   10 mL at 07/31/15 1635  . sodium chloride 0.9 % injection 10 mL  10 mL Intracatheter PRN Curt Bears, MD        SURGICAL HISTORY:  Past Surgical History:  Procedure Laterality Date  . ABDOMINAL HYSTERECTOMY    . APPENDECTOMY    . IR GENERIC HISTORICAL  05/02/2016   IR RADIOLOGIST EVAL & MGMT 05/02/2016 [facility]  . IR GENERIC HISTORICAL  05/08/2016   IR VERTEBROPLASTY EA ADDL (T&LS) BX INC UNI/BIL INC INJECT/IMAGING 05/08/2016 Luanne Bras,  MD [facility]  . IR GENERIC HISTORICAL  05/08/2016   IR VERTEBROPLASTY CERV/THOR BX INC UNI/BIL INC/INJECT/IMAGING 05/08/2016 Luanne Bras, MD [facility]  . IR GENERIC HISTORICAL  05/29/2016   IR RADIOLOGIST EVAL & MGMT 05/29/2016 [facility]  . OOPHORECTOMY    . PORTACATH PLACEMENT  jan. 2016  . TONSILLECTOMY    . VIDEO BRONCHOSCOPY WITH ENDOBRONCHIAL NAVIGATION N/A 08/14/2014   Procedure: VIDEO BRONCHOSCOPY WITH ENDOBRONCHIAL NAVIGATION;  Surgeon: Rigoberto Noel, MD;  Location: [facility];  Service: Thoracic;  Laterality: N/A;    REVIEW OF SYSTEMS:  A comprehensive review of systems was negative except for: Constitutional: positive for fatigue Gastrointestinal: positive for nausea   PHYSICAL EXAMINATION: General appearance: alert, cooperative, fatigued and no distress Head: Normocephalic, without obvious abnormality, atraumatic Neck:  no adenopathy, no JVD, supple, symmetrical, trachea midline and thyroid not enlarged, symmetric, no tenderness/mass/nodules Lymph nodes: Cervical, supraclavicular, and axillary nodes normal. Resp: clear to auscultation bilaterally Back: symmetric, no curvature. ROM normal. No CVA tenderness. Cardio: regular rate and rhythm, S1, S2 normal, no murmur, click, rub or gallop GI: soft, non-tender; bowel sounds normal; no masses,  no organomegaly Extremities: extremities normal, atraumatic, no cyanosis or edema   ECOG PERFORMANCE STATUS: 1 - Symptomatic but completely ambulatory  Blood pressure (!) 121/91, pulse (!) 130, temperature 98.4 F (36.9 C), temperature source Oral, resp. rate (!) 21, height '5\' 5"'$  (1.651 m), weight 195 lb 11.2 oz (88.8 kg), SpO2 96 %.  LABORATORY DATA: Lab Results  Component Value Date   WBC 11.2 (H) 09/16/2016   HGB 13.4 09/16/2016   HCT 40.1 09/16/2016   MCV 93.1 09/16/2016   PLT 472 (H) 09/16/2016      Chemistry      Component Value Date/Time   NA 140 09/10/2016 0946   K 4.0 09/10/2016 0946   CL 98 (L) 05/21/2016  0325   CO2 22 09/10/2016 0946   BUN 12.8 09/10/2016 0946   CREATININE 0.7 09/10/2016 0946      Component Value Date/Time   CALCIUM 9.9 09/10/2016 0946   ALKPHOS 118 09/10/2016 0946   AST 10 09/10/2016 0946   ALT 10 09/10/2016 0946   BILITOT 0.22 09/10/2016 0946       RADIOGRAPHIC STUDIES: No results found.  ASSESSMENT AND PLAN:  This is a very pleasant 66 years old white female with stage IV non-small cell lung cancer, adenocarcinoma status post several regimens of chemotherapy and immunotherapy. She is currently undergoing treatment with chemotherapy with docetaxel and Cyramza status post 1 cycle. She tolerated the first cycle of her treatment well with no significant adverse effects. I recommended for the patient to proceed with cycle #2 today as a scheduled. She would come back for follow-up visit in 3 weeks for evaluation before starting cycle #3. For anxiety I gave the patient refill of Xanax. For depression, she was given a refill of Remeron. She was advised to call immediately if she has any concerning symptoms in the interval. The patient voices understanding of current disease status and treatment options and is in agreement with the current care plan.  All questions were answered. The patient knows to call the clinic with any problems, questions or concerns. We can certainly see the patient much sooner if necessary. I spent 10 minutes counseling the patient face to face. The total time spent in the appointment was 15 minutes.  Disclaimer: This note was dictated with voice recognition software. Similar sounding words can inadvertently be transcribed and may not be corrected upon review.

## 2016-09-16 NOTE — Telephone Encounter (Signed)
Appointments scheduled per 2/20 LOS. Patient given AVS report and calendars with future scheduled appointments. °

## 2016-09-17 ENCOUNTER — Telehealth: Payer: Self-pay | Admitting: Pulmonary Disease

## 2016-09-17 NOTE — Telephone Encounter (Signed)
Spoke with patient regarding Anoro. She agreed to start back on the Anoro. Nothing else was needed at the time of call.

## 2016-09-17 NOTE — Telephone Encounter (Signed)
Pt called in and stated she noticed since back on Chemo, she has being having increase sob,slight wheezing and needing her rescue inhaler a little more. She states her cough has remained unchanged. She states she is taking her Memory Dance but is unable to afford her Spiriva. She wanted to know if she should just start back on the Anoro. She states Dr. Earlie Server says she has no restrictions as far as her using a steroid inhalers.   RA- Please Advise which inhaler you recommend the pt to be on

## 2016-09-17 NOTE — Telephone Encounter (Signed)
She can get back on [facility]  If she remains short of breath, we can trial Trelegy Either of these will remain only one co-pay

## 2016-09-23 ENCOUNTER — Telehealth: Payer: Self-pay | Admitting: Medical Oncology

## 2016-09-23 ENCOUNTER — Telehealth: Payer: Self-pay | Admitting: Pulmonary Disease

## 2016-09-23 ENCOUNTER — Other Ambulatory Visit (HOSPITAL_BASED_OUTPATIENT_CLINIC_OR_DEPARTMENT_OTHER): Payer: Self-pay

## 2016-09-23 DIAGNOSIS — C3492 Malignant neoplasm of unspecified part of left bronchus or lung: Secondary | ICD-10-CM

## 2016-09-23 DIAGNOSIS — C3412 Malignant neoplasm of upper lobe, left bronchus or lung: Secondary | ICD-10-CM

## 2016-09-23 LAB — COMPREHENSIVE METABOLIC PANEL
ALK PHOS: 156 U/L — AB (ref 40–150)
ALT: 17 U/L (ref 0–55)
ANION GAP: 13 meq/L — AB (ref 3–11)
AST: 14 U/L (ref 5–34)
Albumin: 3.4 g/dL — ABNORMAL LOW (ref 3.5–5.0)
BUN: 6.6 mg/dL — ABNORMAL LOW (ref 7.0–26.0)
CALCIUM: 9.5 mg/dL (ref 8.4–10.4)
CO2: 26 mEq/L (ref 22–29)
Chloride: 101 mEq/L (ref 98–109)
Creatinine: 0.8 mg/dL (ref 0.6–1.1)
EGFR: 84 mL/min/{1.73_m2} — AB (ref >90)
Glucose: 157 mg/dl — ABNORMAL HIGH (ref 70–140)
POTASSIUM: 3.2 meq/L — AB (ref 3.5–5.1)
Sodium: 139 mEq/L (ref 136–145)
Total Bilirubin: 0.26 mg/dL (ref 0.20–1.20)
Total Protein: 6.9 g/dL (ref 6.4–8.3)

## 2016-09-23 LAB — CBC WITH DIFFERENTIAL/PLATELET
BASO%: 2.6 % — ABNORMAL HIGH (ref 0.0–2.0)
BASOS ABS: 0.4 10*3/uL — AB (ref 0.0–0.1)
EOS ABS: 0.1 10*3/uL (ref 0.0–0.5)
EOS%: 0.9 % (ref 0.0–7.0)
HEMATOCRIT: 42.7 % (ref 34.8–46.6)
HEMOGLOBIN: 14.3 g/dL (ref 11.6–15.9)
LYMPH#: 2.2 10*3/uL (ref 0.9–3.3)
LYMPH%: 16.2 % (ref 14.0–49.7)
MCH: 31 pg (ref 25.1–34.0)
MCHC: 33.5 g/dL (ref 31.5–36.0)
MCV: 92.4 fL (ref 79.5–101.0)
MONO#: 1.6 10*3/uL — ABNORMAL HIGH (ref 0.1–0.9)
MONO%: 11.8 % (ref 0.0–14.0)
NEUT#: 9.4 10*3/uL — ABNORMAL HIGH (ref 1.5–6.5)
NEUT%: 68.5 % (ref 38.4–76.8)
Platelets: 284 10*3/uL (ref 145–400)
RBC: 4.62 10*6/uL (ref 3.70–5.45)
RDW: 14.7 % — AB (ref 11.2–14.5)
WBC: 13.7 10*3/uL — ABNORMAL HIGH (ref 3.9–10.3)

## 2016-09-23 MED ORDER — UMECLIDINIUM-VILANTEROL 62.5-25 MCG/INH IN AEPB: 1.0000 | INHALATION_SPRAY | Freq: Every day | RESPIRATORY_TRACT | 0 refills | Status: DC

## 2016-09-23 NOTE — Telephone Encounter (Signed)
Patient returned call.  States Anoro is helping a lot and she can breathe so much better.  Advised the patient of the Cherina's note and she states she will come by and pick up Anoro samples in the morning.  Does not need a call back unless we need to speak with her further.

## 2016-09-23 NOTE — Telephone Encounter (Signed)
RA wants her to start back on Anoro per the last message. Patient wanted samples at the time of the last call, but we did not have any then. RA ok'ed the samples.  I have placed 2 samples of Anoro up front for patient to pickup.   Left a message for patient to call back.

## 2016-09-23 NOTE — Telephone Encounter (Signed)
I left a message for pt to increase K+ rich foods  in diet including: bananas,oranges, avocado, squash, raisins, potatoes, cabbage,cauliflower, tuna,honey,beef,chicken,sardines,milk,turkey,

## 2016-09-30 ENCOUNTER — Other Ambulatory Visit (HOSPITAL_BASED_OUTPATIENT_CLINIC_OR_DEPARTMENT_OTHER): Payer: Self-pay

## 2016-09-30 DIAGNOSIS — C3492 Malignant neoplasm of unspecified part of left bronchus or lung: Secondary | ICD-10-CM

## 2016-09-30 LAB — CBC WITH DIFFERENTIAL/PLATELET
BASO%: 1 % (ref 0.0–2.0)
Basophils Absolute: 0.1 10*3/uL (ref 0.0–0.1)
EOS%: 0.4 % (ref 0.0–7.0)
Eosinophils Absolute: 0 10*3/uL (ref 0.0–0.5)
HEMATOCRIT: 40.7 % (ref 34.8–46.6)
HEMOGLOBIN: 13.6 g/dL (ref 11.6–15.9)
LYMPH#: 1.4 10*3/uL (ref 0.9–3.3)
LYMPH%: 13.3 % — ABNORMAL LOW (ref 14.0–49.7)
MCH: 30.8 pg (ref 25.1–34.0)
MCHC: 33.3 g/dL (ref 31.5–36.0)
MCV: 92.5 fL (ref 79.5–101.0)
MONO#: 1 10*3/uL — ABNORMAL HIGH (ref 0.1–0.9)
MONO%: 8.7 % (ref 0.0–14.0)
NEUT#: 8.4 10*3/uL — ABNORMAL HIGH (ref 1.5–6.5)
NEUT%: 76.6 % (ref 38.4–76.8)
PLATELETS: 287 10*3/uL (ref 145–400)
RBC: 4.39 10*6/uL (ref 3.70–5.45)
RDW: 15.5 % — AB (ref 11.2–14.5)
WBC: 10.9 10*3/uL — ABNORMAL HIGH (ref 3.9–10.3)

## 2016-09-30 LAB — COMPREHENSIVE METABOLIC PANEL
ALBUMIN: 3.3 g/dL — AB (ref 3.5–5.0)
ALK PHOS: 117 U/L (ref 40–150)
ALT: 8 U/L (ref 0–55)
AST: 10 U/L (ref 5–34)
Anion Gap: 10 mEq/L (ref 3–11)
BUN: 14.6 mg/dL (ref 7.0–26.0)
CALCIUM: 9.8 mg/dL (ref 8.4–10.4)
CHLORIDE: 106 meq/L (ref 98–109)
CO2: 21 mEq/L — ABNORMAL LOW (ref 22–29)
CREATININE: 0.7 mg/dL (ref 0.6–1.1)
EGFR: >90 mL/min/{1.73_m2} (ref >90)
Glucose: 112 mg/dl (ref 70–140)
Potassium: 4.7 mEq/L (ref 3.5–5.1)
Sodium: 137 mEq/L (ref 136–145)
TOTAL PROTEIN: 7 g/dL (ref 6.4–8.3)
Total Bilirubin: <0.2 mg/dL (ref 0.2–1.2)

## 2016-09-30 LAB — UA PROTEIN, DIPSTICK - CHCC: PROTEIN: NEGATIVE mg/dL

## 2016-10-07 ENCOUNTER — Other Ambulatory Visit (HOSPITAL_BASED_OUTPATIENT_CLINIC_OR_DEPARTMENT_OTHER): Payer: Self-pay

## 2016-10-07 ENCOUNTER — Ambulatory Visit (HOSPITAL_BASED_OUTPATIENT_CLINIC_OR_DEPARTMENT_OTHER): Payer: Self-pay

## 2016-10-07 ENCOUNTER — Ambulatory Visit: Payer: Self-pay | Admitting: Internal Medicine

## 2016-10-07 ENCOUNTER — Other Ambulatory Visit: Payer: Self-pay

## 2016-10-07 ENCOUNTER — Other Ambulatory Visit: Payer: Self-pay | Admitting: Medical Oncology

## 2016-10-07 VITALS — BP 115/80 | HR 99 | Temp 97.7°F | Resp 16

## 2016-10-07 DIAGNOSIS — Z5112 Encounter for antineoplastic immunotherapy: Secondary | ICD-10-CM

## 2016-10-07 DIAGNOSIS — C3492 Malignant neoplasm of unspecified part of left bronchus or lung: Secondary | ICD-10-CM

## 2016-10-07 DIAGNOSIS — Z95828 Presence of other vascular implants and grafts: Secondary | ICD-10-CM

## 2016-10-07 DIAGNOSIS — Z5111 Encounter for antineoplastic chemotherapy: Secondary | ICD-10-CM

## 2016-10-07 DIAGNOSIS — F411 Generalized anxiety disorder: Secondary | ICD-10-CM

## 2016-10-07 LAB — CBC WITH DIFFERENTIAL/PLATELET
BASO%: 1.2 % (ref 0.0–2.0)
Basophils Absolute: 0.1 10*3/uL (ref 0.0–0.1)
EOS ABS: 0 10*3/uL (ref 0.0–0.5)
EOS%: 0 % (ref 0.0–7.0)
HEMATOCRIT: 41.9 % (ref 34.8–46.6)
HEMOGLOBIN: 13.6 g/dL (ref 11.6–15.9)
LYMPH#: 0.6 10*3/uL — AB (ref 0.9–3.3)
LYMPH%: 5.3 % — AB (ref 14.0–49.7)
MCH: 30.3 pg (ref 25.1–34.0)
MCHC: 32.4 g/dL (ref 31.5–36.0)
MCV: 93.6 fL (ref 79.5–101.0)
MONO#: 1 10*3/uL — AB (ref 0.1–0.9)
MONO%: 8.5 % (ref 0.0–14.0)
NEUT%: 85 % — ABNORMAL HIGH (ref 38.4–76.8)
NEUTROS ABS: 10.1 10*3/uL — AB (ref 1.5–6.5)
PLATELETS: 475 10*3/uL — AB (ref 145–400)
RBC: 4.48 10*6/uL (ref 3.70–5.45)
RDW: 16.7 % — AB (ref 11.2–14.5)
WBC: 11.9 10*3/uL — AB (ref 3.9–10.3)

## 2016-10-07 LAB — COMPREHENSIVE METABOLIC PANEL
ALBUMIN: 3.7 g/dL (ref 3.5–5.0)
ALK PHOS: 115 U/L (ref 40–150)
ALT: 10 U/L (ref 0–55)
ANION GAP: 13 meq/L — AB (ref 3–11)
AST: 10 U/L (ref 5–34)
BILIRUBIN TOTAL: 0.22 mg/dL (ref 0.20–1.20)
BUN: 9.7 mg/dL (ref 7.0–26.0)
CALCIUM: 10.2 mg/dL (ref 8.4–10.4)
CO2: 22 mEq/L (ref 22–29)
CREATININE: 0.8 mg/dL (ref 0.6–1.1)
Chloride: 106 mEq/L (ref 98–109)
EGFR: 84 mL/min/{1.73_m2} — ABNORMAL LOW (ref >90)
Glucose: 105 mg/dl (ref 70–140)
Potassium: 4.4 mEq/L (ref 3.5–5.1)
Sodium: 142 mEq/L (ref 136–145)
TOTAL PROTEIN: 7.4 g/dL (ref 6.4–8.3)

## 2016-10-07 MED ORDER — DIPHENHYDRAMINE HCL 50 MG/ML IJ SOLN
50.0000 mg | Freq: Once | INTRAMUSCULAR | Status: AC
  Administered 2016-10-07: 50 mg via INTRAVENOUS

## 2016-10-07 MED ORDER — LIDOCAINE-PRILOCAINE 2.5-2.5 % EX CREA: 1.0000 "application " | TOPICAL_CREAM | CUTANEOUS | 0 refills | Status: AC | PRN

## 2016-10-07 MED ORDER — ALPRAZOLAM 0.25 MG PO TABS: 0.2500 mg | ORAL_TABLET | Freq: Two times a day (BID) | ORAL | 0 refills | Status: DC | PRN

## 2016-10-07 MED ORDER — DEXAMETHASONE SODIUM PHOSPHATE 10 MG/ML IJ SOLN
10.0000 mg | Freq: Once | INTRAMUSCULAR | Status: AC
  Administered 2016-10-07: 10 mg via INTRAVENOUS

## 2016-10-07 MED ORDER — SODIUM CHLORIDE 0.9% FLUSH
10.0000 mL | INTRAVENOUS | Status: DC | PRN
  Administered 2016-10-07: 10 mL
  Filled 2016-10-07: qty 10

## 2016-10-07 MED ORDER — DEXAMETHASONE SODIUM PHOSPHATE 10 MG/ML IJ SOLN
INTRAMUSCULAR | Status: AC
  Filled 2016-10-07: qty 1

## 2016-10-07 MED ORDER — HEPARIN SOD (PORK) LOCK FLUSH 100 UNIT/ML IV SOLN
500.0000 [IU] | Freq: Once | INTRAVENOUS | Status: AC | PRN
  Administered 2016-10-07: 500 [IU]
  Filled 2016-10-07: qty 5

## 2016-10-07 MED ORDER — ACETAMINOPHEN 325 MG PO TABS
650.0000 mg | ORAL_TABLET | Freq: Once | ORAL | Status: AC
Start: 2016-10-07 — End: 2016-10-07
  Administered 2016-10-07: 650 mg via ORAL

## 2016-10-07 MED ORDER — DIPHENHYDRAMINE HCL 50 MG/ML IJ SOLN
INTRAMUSCULAR | Status: AC
  Filled 2016-10-07: qty 1

## 2016-10-07 MED ORDER — PEGFILGRASTIM 6 MG/0.6ML ~~LOC~~ PSKT
6.0000 mg | PREFILLED_SYRINGE | Freq: Once | SUBCUTANEOUS | Status: AC
  Administered 2016-10-07: 6 mg via SUBCUTANEOUS
  Filled 2016-10-07: qty 0.6

## 2016-10-07 MED ORDER — ACETAMINOPHEN 325 MG PO TABS
ORAL_TABLET | ORAL | Status: AC
  Filled 2016-10-07: qty 2

## 2016-10-07 MED ORDER — SODIUM CHLORIDE 0.9 % IV SOLN
Freq: Once | INTRAVENOUS | Status: AC
  Administered 2016-10-07: 11:00:00 via INTRAVENOUS

## 2016-10-07 MED ORDER — DOCETAXEL CHEMO INJECTION 160 MG/16ML
75.0000 mg/m2 | Freq: Once | INTRAVENOUS | Status: AC
  Administered 2016-10-07: 150 mg via INTRAVENOUS
  Filled 2016-10-07: qty 15

## 2016-10-07 MED ORDER — SODIUM CHLORIDE 0.9 % IV SOLN
10.0000 mg/kg | Freq: Once | INTRAVENOUS | Status: AC
  Administered 2016-10-07: 900 mg via INTRAVENOUS
  Filled 2016-10-07: qty 50

## 2016-10-07 MED FILL — ALPRAZolam 0.25 MG TABS: 0.25 | 20 days supply | Qty: 40 | Fill #0

## 2016-10-07 MED FILL — LIDOCAINE-PRILOCAINE CREAM: 2.5-2.5 | 14 days supply | Qty: 30 | Fill #0

## 2016-10-13 ENCOUNTER — Other Ambulatory Visit (HOSPITAL_BASED_OUTPATIENT_CLINIC_OR_DEPARTMENT_OTHER): Payer: Self-pay

## 2016-10-13 ENCOUNTER — Ambulatory Visit (INDEPENDENT_AMBULATORY_CARE_PROVIDER_SITE_OTHER): Payer: Self-pay | Admitting: Pulmonary Disease

## 2016-10-13 ENCOUNTER — Encounter: Payer: Self-pay | Admitting: Pulmonary Disease

## 2016-10-13 DIAGNOSIS — C3492 Malignant neoplasm of unspecified part of left bronchus or lung: Secondary | ICD-10-CM

## 2016-10-13 DIAGNOSIS — J449 Chronic obstructive pulmonary disease, unspecified: Secondary | ICD-10-CM

## 2016-10-13 DIAGNOSIS — F411 Generalized anxiety disorder: Secondary | ICD-10-CM

## 2016-10-13 LAB — COMPREHENSIVE METABOLIC PANEL
ALT: 17 U/L (ref 0–55)
ANION GAP: 12 meq/L — AB (ref 3–11)
AST: 17 U/L (ref 5–34)
Albumin: 3.3 g/dL — ABNORMAL LOW (ref 3.5–5.0)
Alkaline Phosphatase: 168 U/L — ABNORMAL HIGH (ref 40–150)
BUN: 8.9 mg/dL (ref 7.0–26.0)
CO2: 23 meq/L (ref 22–29)
CREATININE: 0.7 mg/dL (ref 0.6–1.1)
Calcium: 9.6 mg/dL (ref 8.4–10.4)
Chloride: 106 mEq/L (ref 98–109)
EGFR: 86 mL/min/{1.73_m2} — ABNORMAL LOW (ref >90)
Glucose: 113 mg/dl (ref 70–140)
Potassium: 3.8 mEq/L (ref 3.5–5.1)
Sodium: 141 mEq/L (ref 136–145)
Total Bilirubin: <0.22 mg/dL (ref 0.20–1.20)
Total Protein: 6.7 g/dL (ref 6.4–8.3)

## 2016-10-13 LAB — CBC WITH DIFFERENTIAL/PLATELET
BASO%: 2.1 % — ABNORMAL HIGH (ref 0.0–2.0)
Basophils Absolute: 0.2 10*3/uL — ABNORMAL HIGH (ref 0.0–0.1)
EOS ABS: 0.1 10*3/uL (ref 0.0–0.5)
EOS%: 1.1 % (ref 0.0–7.0)
HEMATOCRIT: 42.4 % (ref 34.8–46.6)
HEMOGLOBIN: 14.2 g/dL (ref 11.6–15.9)
LYMPH#: 2 10*3/uL (ref 0.9–3.3)
LYMPH%: 19.2 % (ref 14.0–49.7)
MCH: 30.9 pg (ref 25.1–34.0)
MCHC: 33.5 g/dL (ref 31.5–36.0)
MCV: 92.4 fL (ref 79.5–101.0)
MONO#: 1.5 10*3/uL — AB (ref 0.1–0.9)
MONO%: 14.9 % — ABNORMAL HIGH (ref 0.0–14.0)
NEUT%: 62.7 % (ref 38.4–76.8)
NEUTROS ABS: 6.4 10*3/uL (ref 1.5–6.5)
Platelets: 270 10*3/uL (ref 145–400)
RBC: 4.59 10*6/uL (ref 3.70–5.45)
RDW: 16.4 % — AB (ref 11.2–14.5)
WBC: 10.2 10*3/uL (ref 3.9–10.3)
nRBC: 2 % — ABNORMAL HIGH (ref 0–0)

## 2016-10-13 MED ORDER — UMECLIDINIUM-VILANTEROL 62.5-25 MCG/INH IN AEPB: 1.0000 | INHALATION_SPRAY | Freq: Every day | RESPIRATORY_TRACT | 3 refills | Status: DC

## 2016-10-13 MED ORDER — UMECLIDINIUM-VILANTEROL 62.5-25 MCG/INH IN AEPB: 1.0000 | INHALATION_SPRAY | Freq: Every day | RESPIRATORY_TRACT | 0 refills | Status: DC

## 2016-10-13 NOTE — Progress Notes (Signed)
   Subjective:    Patient ID: Heather Banks, female    DOB: 1951-05-06, 66 y.o.   MRN: [id number]  HPI  66 yo smoker with COPD on 3l home O2 and adenocarcinoma lung, stage IV. She presented 06/2014 with bilateral upper lobe lung mass w/ minmally enlarged hilar and mediastinal nodes, hypermetabolic on PET Underwent ENB guided biopsy , LUL bx was neg, RUL-2 targets showed adenoCA- felt to be synchronous tumors  Had disease progression on chemoRx x 6 cycles 66 Completed immunotherapy  Palliative Radiotherapy in LUL mass completed in 10/2015   10/13/2016  Was scheduled for sleep study to see if an IV would help but did not go through with this. She was referred to a psychiatrist for severe anxiety   She underwent vertebroplasty for fractures in 2017 Unfortunately she had disease progression IN the right upper lobe nodule and is back on chemotherapy She has issues with confidence and worsening anxiety since she is losing her hair      Significant tests/ events  PFT 06/2014 FEV1 at 46%, ratio of 59. Diffusing capacity was decreased at 56% , FVC was 60%  6/2017CT chest showed a small PE in the left lateral basal segment of the pulmonary artery was treated with Xarelto for 3 months Venous Doppler was negative.  CT chest 03/2016 , decreased in LUL mass. Adenopathy resolved. Stable right sided nodules.     Review of Systems neg for any significant sore throat, dysphagia, itching, sneezing, nasal congestion or excess/ purulent secretions, fever, chills, sweats, unintended wt loss, pleuritic or exertional cp, hempoptysis, orthopnea pnd or change in chronic leg swelling. Also denies presyncope, palpitations, heartburn, abdominal pain, nausea, vomiting, diarrhea or change in bowel or urinary habits, dysuria,hematuria, rash, arthralgias, visual complaints, headache, numbness weakness or ataxia.     Objective:   Physical Exam  Gen. Pleasant, well-nourished, in no distress ENT -  STEROID FACIES, no post nasal drip Neck: No JVD, no thyromegaly, no carotid bruits Lungs: no use of accessory muscles, no dullness to percussion, clear without rales or rhonchi  Cardiovascular: Rhythm regular, heart sounds  normal, no murmurs or gallops, no peripheral edema Musculoskeletal: No deformities, no cyanosis or clubbing        Assessment & Plan:

## 2016-10-13 NOTE — Assessment & Plan Note (Signed)
Ask the cancer center about support groups CT XANAX PRN

## 2016-10-13 NOTE — Assessment & Plan Note (Signed)
Rx for Anoro will be sent to pharmacy She is getting steroids intermittently with chemotherapy We'll consider TRELEGY in the future

## 2016-10-13 NOTE — Addendum Note (Signed)
Addended by: Valerie Salts on: 10/13/2016 09:58 AM   Modules accepted: Orders

## 2016-10-13 NOTE — Patient Instructions (Signed)
Rx for Anoro will be sent to pharmacy Ask the cancer center about support groups Call as needed

## 2016-10-14 ENCOUNTER — Other Ambulatory Visit: Payer: Self-pay

## 2016-10-21 ENCOUNTER — Other Ambulatory Visit (HOSPITAL_BASED_OUTPATIENT_CLINIC_OR_DEPARTMENT_OTHER): Payer: Self-pay

## 2016-10-21 DIAGNOSIS — C3492 Malignant neoplasm of unspecified part of left bronchus or lung: Secondary | ICD-10-CM

## 2016-10-21 LAB — COMPREHENSIVE METABOLIC PANEL
ALBUMIN: 3.5 g/dL (ref 3.5–5.0)
ALK PHOS: 123 U/L (ref 40–150)
ALT: 9 U/L (ref 0–55)
AST: 12 U/L (ref 5–34)
Anion Gap: 11 mEq/L (ref 3–11)
BILIRUBIN TOTAL: 0.23 mg/dL (ref 0.20–1.20)
BUN: 7.2 mg/dL (ref 7.0–26.0)
CALCIUM: 9.7 mg/dL (ref 8.4–10.4)
CO2: 22 mEq/L (ref 22–29)
Chloride: 106 mEq/L (ref 98–109)
Creatinine: 0.7 mg/dL (ref 0.6–1.1)
EGFR: >90 mL/min/{1.73_m2} (ref >90)
Glucose: 99 mg/dl (ref 70–140)
Potassium: 4.8 mEq/L (ref 3.5–5.1)
Sodium: 140 mEq/L (ref 136–145)
Total Protein: 7.1 g/dL (ref 6.4–8.3)

## 2016-10-21 LAB — CBC WITH DIFFERENTIAL/PLATELET
BASO%: 0.2 % (ref 0.0–2.0)
Basophils Absolute: 0 10*3/uL (ref 0.0–0.1)
EOS%: 0.2 % (ref 0.0–7.0)
Eosinophils Absolute: 0 10*3/uL (ref 0.0–0.5)
HEMATOCRIT: 41.1 % (ref 34.8–46.6)
HGB: 13.8 g/dL (ref 11.6–15.9)
LYMPH#: 1.5 10*3/uL (ref 0.9–3.3)
LYMPH%: 12.7 % — ABNORMAL LOW (ref 14.0–49.7)
MCH: 31.4 pg (ref 25.1–34.0)
MCHC: 33.6 g/dL (ref 31.5–36.0)
MCV: 93.4 fL (ref 79.5–101.0)
MONO#: 1.3 10*3/uL — ABNORMAL HIGH (ref 0.1–0.9)
MONO%: 10.4 % (ref 0.0–14.0)
NEUT#: 9.3 10*3/uL — ABNORMAL HIGH (ref 1.5–6.5)
NEUT%: 76.5 % (ref 38.4–76.8)
PLATELETS: 281 10*3/uL (ref 145–400)
RBC: 4.4 10*6/uL (ref 3.70–5.45)
RDW: 17.5 % — AB (ref 11.2–14.5)
WBC: 12.1 10*3/uL — ABNORMAL HIGH (ref 3.9–10.3)

## 2016-10-21 LAB — UA PROTEIN, DIPSTICK - CHCC

## 2016-10-28 ENCOUNTER — Encounter: Payer: Self-pay | Admitting: Oncology

## 2016-10-28 ENCOUNTER — Ambulatory Visit (HOSPITAL_BASED_OUTPATIENT_CLINIC_OR_DEPARTMENT_OTHER): Payer: Self-pay | Admitting: Internal Medicine

## 2016-10-28 ENCOUNTER — Telehealth: Payer: Self-pay | Admitting: Internal Medicine

## 2016-10-28 ENCOUNTER — Encounter: Payer: Self-pay | Admitting: Internal Medicine

## 2016-10-28 ENCOUNTER — Ambulatory Visit (HOSPITAL_BASED_OUTPATIENT_CLINIC_OR_DEPARTMENT_OTHER): Payer: Self-pay

## 2016-10-28 ENCOUNTER — Other Ambulatory Visit (HOSPITAL_BASED_OUTPATIENT_CLINIC_OR_DEPARTMENT_OTHER): Payer: Self-pay

## 2016-10-28 VITALS — BP 144/76 | HR 124 | Temp 98.1°F | Resp 18 | Ht 65.0 in | Wt 191.0 lb

## 2016-10-28 DIAGNOSIS — Z5189 Encounter for other specified aftercare: Secondary | ICD-10-CM

## 2016-10-28 DIAGNOSIS — Z5111 Encounter for antineoplastic chemotherapy: Secondary | ICD-10-CM

## 2016-10-28 DIAGNOSIS — C3492 Malignant neoplasm of unspecified part of left bronchus or lung: Secondary | ICD-10-CM

## 2016-10-28 DIAGNOSIS — Z95828 Presence of other vascular implants and grafts: Secondary | ICD-10-CM

## 2016-10-28 DIAGNOSIS — Z5112 Encounter for antineoplastic immunotherapy: Secondary | ICD-10-CM

## 2016-10-28 DIAGNOSIS — F411 Generalized anxiety disorder: Secondary | ICD-10-CM

## 2016-10-28 LAB — CBC WITH DIFFERENTIAL/PLATELET
BASO%: 0.6 % (ref 0.0–2.0)
Basophils Absolute: 0.1 10*3/uL (ref 0.0–0.1)
EOS ABS: 0 10*3/uL (ref 0.0–0.5)
EOS%: 0 % (ref 0.0–7.0)
HCT: 40.3 % (ref 34.8–46.6)
HGB: 13.8 g/dL (ref 11.6–15.9)
LYMPH%: 7.8 % — AB (ref 14.0–49.7)
MCH: 31.9 pg (ref 25.1–34.0)
MCHC: 34.2 g/dL (ref 31.5–36.0)
MCV: 93.4 fL (ref 79.5–101.0)
MONO#: 0.3 10*3/uL (ref 0.1–0.9)
MONO%: 2.6 % (ref 0.0–14.0)
NEUT%: 89 % — ABNORMAL HIGH (ref 38.4–76.8)
NEUTROS ABS: 8.8 10*3/uL — AB (ref 1.5–6.5)
Platelets: 419 10*3/uL — ABNORMAL HIGH (ref 145–400)
RBC: 4.32 10*6/uL (ref 3.70–5.45)
RDW: 17.8 % — ABNORMAL HIGH (ref 11.2–14.5)
WBC: 9.9 10*3/uL (ref 3.9–10.3)
lymph#: 0.8 10*3/uL — ABNORMAL LOW (ref 0.9–3.3)

## 2016-10-28 LAB — COMPREHENSIVE METABOLIC PANEL
ALT: 11 U/L (ref 0–55)
AST: 11 U/L (ref 5–34)
Albumin: 3.6 g/dL (ref 3.5–5.0)
Alkaline Phosphatase: 95 U/L (ref 40–150)
Anion Gap: 15 mEq/L — ABNORMAL HIGH (ref 3–11)
BUN: 9.8 mg/dL (ref 7.0–26.0)
CHLORIDE: 105 meq/L (ref 98–109)
CO2: 18 meq/L — AB (ref 22–29)
CREATININE: 0.7 mg/dL (ref 0.6–1.1)
Calcium: 9.7 mg/dL (ref 8.4–10.4)
EGFR: 86 mL/min/{1.73_m2} — ABNORMAL LOW (ref >90)
Glucose: 168 mg/dl — ABNORMAL HIGH (ref 70–140)
Potassium: 4.4 mEq/L (ref 3.5–5.1)
SODIUM: 138 meq/L (ref 136–145)
Total Bilirubin: <0.22 mg/dL (ref 0.20–1.20)
Total Protein: 7.2 g/dL (ref 6.4–8.3)

## 2016-10-28 MED ORDER — SODIUM CHLORIDE 0.9 % IV SOLN
10.0000 mg/kg | Freq: Once | INTRAVENOUS | Status: AC
  Administered 2016-10-28: 900 mg via INTRAVENOUS
  Filled 2016-10-28: qty 40

## 2016-10-28 MED ORDER — SODIUM CHLORIDE 0.9% FLUSH
10.0000 mL | INTRAVENOUS | Status: DC | PRN
  Administered 2016-10-28: 10 mL
  Filled 2016-10-28: qty 10

## 2016-10-28 MED ORDER — PEGFILGRASTIM 6 MG/0.6ML ~~LOC~~ PSKT
6.0000 mg | PREFILLED_SYRINGE | Freq: Once | SUBCUTANEOUS | Status: AC
  Administered 2016-10-28: 6 mg via SUBCUTANEOUS
  Filled 2016-10-28: qty 0.6

## 2016-10-28 MED ORDER — DEXAMETHASONE SODIUM PHOSPHATE 10 MG/ML IJ SOLN
10.0000 mg | Freq: Once | INTRAMUSCULAR | Status: AC
  Administered 2016-10-28: 10 mg via INTRAVENOUS

## 2016-10-28 MED ORDER — ALPRAZOLAM 0.25 MG PO TABS: 0.2500 mg | ORAL_TABLET | Freq: Two times a day (BID) | ORAL | 0 refills | Status: DC | PRN

## 2016-10-28 MED ORDER — DOCETAXEL CHEMO INJECTION 160 MG/16ML
75.0000 mg/m2 | Freq: Once | INTRAVENOUS | Status: AC
  Administered 2016-10-28: 150 mg via INTRAVENOUS
  Filled 2016-10-28: qty 15

## 2016-10-28 MED ORDER — DIPHENHYDRAMINE HCL 50 MG/ML IJ SOLN
50.0000 mg | Freq: Once | INTRAMUSCULAR | Status: AC
Start: 2016-10-28 — End: 2016-10-28
  Administered 2016-10-28: 50 mg via INTRAVENOUS

## 2016-10-28 MED ORDER — SODIUM CHLORIDE 0.9 % IV SOLN
Freq: Once | INTRAVENOUS | Status: AC
  Administered 2016-10-28: 11:00:00 via INTRAVENOUS

## 2016-10-28 MED ORDER — ACETAMINOPHEN 325 MG PO TABS
ORAL_TABLET | ORAL | Status: AC
  Filled 2016-10-28: qty 2

## 2016-10-28 MED ORDER — DIPHENHYDRAMINE HCL 50 MG/ML IJ SOLN
INTRAMUSCULAR | Status: AC
  Filled 2016-10-28: qty 1

## 2016-10-28 MED ORDER — ACETAMINOPHEN 325 MG PO TABS
650.0000 mg | ORAL_TABLET | Freq: Once | ORAL | Status: AC
  Administered 2016-10-28: 650 mg via ORAL

## 2016-10-28 MED ORDER — HEPARIN SOD (PORK) LOCK FLUSH 100 UNIT/ML IV SOLN
500.0000 [IU] | Freq: Once | INTRAVENOUS | Status: AC | PRN
  Administered 2016-10-28: 500 [IU]
  Filled 2016-10-28: qty 5

## 2016-10-28 MED ORDER — DEXAMETHASONE SODIUM PHOSPHATE 10 MG/ML IJ SOLN
INTRAMUSCULAR | Status: AC
  Filled 2016-10-28: qty 1

## 2016-10-28 NOTE — Progress Notes (Signed)
[facility] [phone]   [phone]) [phone]  OFFICE PROGRESS NOTE  [name], NP [address] [place] [address]  DIAGNOSIS: Stage IV (T2a, N0, M1a) non-small cell lung cancer, adenocarcinoma presented with bilateral pulmonary lesions. This could be also consider as synchronous primary tumors.  PRIOR THERAPY:  1) Systemic chemotherapy with carboplatin for AUC of 5 and Alimta 500 MG/M2 every 3 weeks. Status post 6 cycles. 2) Treatment according to the BMS checkmate 370 clinical trial. Randomized to group A arm B1 treatment with pemetrexed (Alimta) 500 mg/m given every 3 weeks. Status post 6 cycles discontinued secondary to disease progression. 3) Tecentriq 1200 mg IV every 3 weeks status post 6 cycles discontinued secondary to significant skin rash. 4) palliative radiotherapy to the left upper lobe lung mass under the care of Dr. Lisbeth Renshaw completed on 11/16/2015.  CURRENT THERAPY: Systemic chemotherapy with docetaxel 75 MG/M2 and Cyramza 10 MG/KG every 3 weeks with Neulasta support. First dose 08/25/2016. Status post 3 cycles.  INTERVAL HISTORY: Heather Banks 66 y.o. female returns to the clinic today for follow-up visit. The patient tolerated the last cycle of her treatment well with no significant adverse effects. She denied having any chest pain, shortness breath, cough or hemoptysis. She continues to have a lot of anxiety and her heart rate is always elevated. She denied having any fever or chills. She has no nausea, vomiting, diarrhea or constipation. She is here today for evaluation before starting cycle #4.   MEDICAL HISTORY: Past Medical History:  Diagnosis Date  . Adenocarcinoma of left lung, stage 4 ([facility]) 07/09/2014  . Compression fracture of thoracic vertebra (HCC) 04/21/2016  . COPD (chronic obstructive pulmonary disease) (HCC)    Albuterol neb as needed;Pulmicort neb daily  . Encounter for antineoplastic chemotherapy 01/21/2015  . Full  code status 05/01/2015  . GERD (gastroesophageal reflux disease)    takes Pantoprazole and Zantac daily  . History of bronchitis    >5 yrs ago  . History of migraine    last one 2 wks ago  . Hx of radiation therapy 10/29/2015 to 11/16/2015   The Left upper lobe was treated to 37.5 Gy in 15 fractions at 2.5 Gy per fraction  . Lung mass   . Panic attacks    but doesn't take any meds  . Pneumonia    hx of-last time about 4+yrs ago    ALLERGIES:  is allergic to ativan [lorazepam] and cinnamon.  MEDICATIONS:  Current Outpatient Prescriptions  Medication Sig Dispense Refill  . acidophilus (RISAQUAD) CAPS capsule Take 1 capsule by mouth daily.    Marland Kitchen albuterol (PROVENTIL) (2.5 MG/3ML) 0.083% nebulizer solution Take 3 mLs (2.5 mg total) by nebulization every 6 (six) hours as needed for wheezing or shortness of breath. 75 mL 3  . ALPRAZolam (XANAX) 0.25 MG tablet Take 1 tablet (0.25 mg total) by mouth 2 (two) times daily as needed for anxiety. 40 tablet 0  . amLODipine (NORVASC) 5 MG tablet Take 1 tablet (5 mg total) by mouth daily. 90 tablet 1  . aspirin-acetaminophen-caffeine (EXCEDRIN MIGRAINE) 482-500-37 MG per tablet Take 2 tablets by mouth every 6 (six) hours as needed for headache.    . calcium carbonate (OS-CAL - DOSED IN MG OF ELEMENTAL CALCIUM) 1250 (500 Ca) MG tablet Take 1 tablet by mouth daily with breakfast.    . cholecalciferol (VITAMIN D) 1000 units tablet Take 1,000 Units by mouth daily.    . cyanocobalamin 2000 MCG tablet  Take 2,000 mcg by mouth daily.    Marland Kitchen dexamethasone (DECADRON) 4 MG tablet 2 tablets by mouth twice a day the day before, day of and day after the chemotherapy every 3 weeks. 40 tablet 1  . diphenhydrAMINE (BENADRYL) 25 mg capsule Take 25 mg by mouth every 6 (six) hours as needed for itching. Reported on 01/19/2016    . famotidine (PEPCID) 20 MG tablet Take 1 tablet (20 mg total) by mouth daily. 15 tablet 0  . Lidocaine (ASPERCREME LIDOCAINE) 4 % PTCH Apply 1 patch  topically daily as needed. Place on back as needed for pain    . lidocaine-prilocaine (EMLA) cream Apply 1 application topically as needed. Apply 1 tsp over port site 1-1.5 hours prior to chemotherapy. 30 g 0  . loratadine (CLARITIN) 10 MG tablet Take 1 tablet (10 mg total) by mouth daily. 15 tablet 0  . mirtazapine (REMERON) 30 MG tablet Take 0.5 tablets (15 mg total) by mouth at bedtime as needed (for sleep). 30 tablet 2  . ondansetron (ZOFRAN) 8 MG tablet Take 1 tablet (8 mg total) by mouth every 8 (eight) hours as needed for nausea or vomiting. 20 tablet 0  . prochlorperazine (COMPAZINE) 10 MG tablet Take 1 tablet (10 mg total) by mouth every 6 (six) hours as needed for nausea or vomiting. 60 tablet 0  . Tiotropium Bromide Monohydrate (SPIRIVA RESPIMAT) 2.5 MCG/ACT AERS Inhale 2 puffs into the lungs daily. 1 Inhaler 0  . umeclidinium-vilanterol (ANORO ELLIPTA) 62.5-25 MCG/INH AEPB Inhale 1 puff into the lungs daily. 60 each 3  . VENTOLIN HFA 108 (90 Base) MCG/ACT inhaler INHALE 2 PUFFS BY MOUTH EVERY 6 HOURS AS NEEDED FOR WHEEZE OR SHORTNESS OF BREATH 18 g 5   No current facility-administered medications for this visit.    Facility-Administered Medications Ordered in Other Visits  Medication Dose Route Frequency Provider Last Rate Last Dose  . heparin lock flush 100 unit/mL  500 Units Intracatheter Once PRN Curt Bears, MD      . sodium chloride 0.9 % injection 10 mL  10 mL Intracatheter PRN Curt Bears, MD   10 mL at 07/31/15 1635  . sodium chloride 0.9 % injection 10 mL  10 mL Intracatheter PRN Curt Bears, MD        SURGICAL HISTORY:  Past Surgical History:  Procedure Laterality Date  . ABDOMINAL HYSTERECTOMY    . APPENDECTOMY    . IR GENERIC HISTORICAL  05/02/2016   IR RADIOLOGIST EVAL & MGMT 05/02/2016 [facility]  . IR GENERIC HISTORICAL  05/08/2016   IR VERTEBROPLASTY EA ADDL (T&LS) BX INC UNI/BIL INC INJECT/IMAGING 05/08/2016 Luanne Bras, MD [facility]  .  IR GENERIC HISTORICAL  05/08/2016   IR VERTEBROPLASTY CERV/THOR BX INC UNI/BIL INC/INJECT/IMAGING 05/08/2016 Luanne Bras, MD [facility]  . IR GENERIC HISTORICAL  05/29/2016   IR RADIOLOGIST EVAL & MGMT 05/29/2016 [facility]  . OOPHORECTOMY    . PORTACATH PLACEMENT  jan. 2016  . TONSILLECTOMY    . VIDEO BRONCHOSCOPY WITH ENDOBRONCHIAL NAVIGATION N/A 08/14/2014   Procedure: VIDEO BRONCHOSCOPY WITH ENDOBRONCHIAL NAVIGATION;  Surgeon: Rigoberto Noel, MD;  Location: [facility];  Service: Thoracic;  Laterality: N/A;    REVIEW OF SYSTEMS:  A comprehensive review of systems was negative except for: Constitutional: positive for fatigue   PHYSICAL EXAMINATION: General appearance: alert, cooperative, fatigued and no distress Head: Normocephalic, without obvious abnormality, atraumatic Neck: no adenopathy, no JVD, supple, symmetrical, trachea midline and thyroid not enlarged, symmetric, no tenderness/mass/nodules  Lymph nodes: Cervical, supraclavicular, and axillary nodes normal. Resp: clear to auscultation bilaterally Back: symmetric, no curvature. ROM normal. No CVA tenderness. Cardio: regular rate and rhythm, S1, S2 normal, no murmur, click, rub or gallop GI: soft, non-tender; bowel sounds normal; no masses,  no organomegaly Extremities: extremities normal, atraumatic, no cyanosis or edema   ECOG PERFORMANCE STATUS: 1 - Symptomatic but completely ambulatory  Blood pressure (!) 144/76, pulse (!) 124, temperature 98.1 F (36.7 C), temperature source Oral, resp. rate 18, height '5\' 5"'$  (1.651 m), weight 191 lb (86.6 kg), SpO2 96 %.  LABORATORY DATA: Lab Results  Component Value Date   WBC 9.9 10/28/2016   HGB 13.8 10/28/2016   HCT 40.3 10/28/2016   MCV 93.4 10/28/2016   PLT 419 (H) 10/28/2016      Chemistry      Component Value Date/Time   NA 138 10/28/2016 0907   K 4.4 10/28/2016 0907   CL 98 (L) 05/21/2016 0325   CO2 18 (L) 10/28/2016 0907   BUN 9.8 10/28/2016 0907   CREATININE  0.7 10/28/2016 0907      Component Value Date/Time   CALCIUM 9.7 10/28/2016 0907   ALKPHOS 95 10/28/2016 0907   AST 11 10/28/2016 0907   ALT 11 10/28/2016 0907   BILITOT <0.22 10/28/2016 0907       RADIOGRAPHIC STUDIES: No results found.  ASSESSMENT AND PLAN:  This is a very pleasant 66 years old white female with a stage IV non-small cell lung cancer, adenocarcinoma status post several chemotherapy and immunotherapy regimens and she is currently on treatment with docetaxel and Cyramza status post 3 cycles. She has been tolerating her treatment well. I recommended for her to proceed with cycle #4 today as a scheduled. I will see her back for follow-up visit in 3 weeks for evaluation after repeating CT scan of the chest, abdomen and pelvis for restaging of her disease. For anxiety, I gave her a refill of Xanax. The patient voices understanding of current disease status and treatment options and is in agreement with the current care plan.  All questions were answered. The patient knows to call the clinic with any problems, questions or concerns. We can certainly see the patient much sooner if necessary. I spent 10 minutes counseling the patient face to face. The total time spent in the appointment was 15 minutes.  Disclaimer: This note was dictated with voice recognition software. Similar sounding words can inadvertently be transcribed and may not be corrected upon review.

## 2016-10-28 NOTE — Progress Notes (Signed)
10/28/16 - [facility]  [id number] - Patient into the cancer center for routine visit.  Patient given PRO (questionnaire) upon arrival to the cancer center.  Patient completed the PRO (EQ-[id number]) before going to the lab.  Patient was thanked for her continued participation in this clinical trial. [name] 10/28/16 - 9:10 am

## 2016-10-28 NOTE — Telephone Encounter (Signed)
Appointments scheduled per 4.3.18 LOS. Patient given AVS report and calendars with future scheduled appointments. Patient will pick up contrast at next visit.

## 2016-10-28 NOTE — Patient Instructions (Signed)
[facility] Discharge Instructions for Patients Receiving Chemotherapy  Today you received the following chemotherapy agents: Cyramza and Taxotere.  To help prevent nausea and vomiting after your treatment, we encourage you to take your nausea medication as prescribed.   If you develop nausea and vomiting that is not controlled by your nausea medication, call the clinic.   BELOW ARE SYMPTOMS THAT SHOULD BE REPORTED IMMEDIATELY:  *FEVER GREATER THAN 100.5 F  *CHILLS WITH OR WITHOUT FEVER  NAUSEA AND VOMITING THAT IS NOT CONTROLLED WITH YOUR NAUSEA MEDICATION  *UNUSUAL SHORTNESS OF BREATH  *UNUSUAL BRUISING OR BLEEDING  TENDERNESS IN MOUTH AND THROAT WITH OR WITHOUT PRESENCE OF ULCERS  *URINARY PROBLEMS  *BOWEL PROBLEMS  UNUSUAL RASH Items with * indicate a potential emergency and should be followed up as soon as possible.  Feel free to call the clinic you have any questions or concerns. The clinic phone number is [phone].  Please show the [place] at check-in to the Emergency Department and triage nurse.

## 2016-11-04 ENCOUNTER — Other Ambulatory Visit (HOSPITAL_BASED_OUTPATIENT_CLINIC_OR_DEPARTMENT_OTHER): Payer: Self-pay

## 2016-11-04 DIAGNOSIS — C3492 Malignant neoplasm of unspecified part of left bronchus or lung: Secondary | ICD-10-CM

## 2016-11-04 LAB — COMPREHENSIVE METABOLIC PANEL
ALT: 14 U/L (ref 0–55)
AST: 15 U/L (ref 5–34)
Albumin: 3.3 g/dL — ABNORMAL LOW (ref 3.5–5.0)
Alkaline Phosphatase: 160 U/L — ABNORMAL HIGH (ref 40–150)
Anion Gap: 13 mEq/L — ABNORMAL HIGH (ref 3–11)
BUN: 10.3 mg/dL (ref 7.0–26.0)
CHLORIDE: 100 meq/L (ref 98–109)
CO2: 26 meq/L (ref 22–29)
CREATININE: 0.7 mg/dL (ref 0.6–1.1)
Calcium: 9.3 mg/dL (ref 8.4–10.4)
EGFR: 89 mL/min/{1.73_m2} — ABNORMAL LOW (ref >90)
Glucose: 115 mg/dl (ref 70–140)
Potassium: 3.2 mEq/L — ABNORMAL LOW (ref 3.5–5.1)
SODIUM: 139 meq/L (ref 136–145)
Total Bilirubin: 0.26 mg/dL (ref 0.20–1.20)
Total Protein: 6.4 g/dL (ref 6.4–8.3)

## 2016-11-04 LAB — CBC WITH DIFFERENTIAL/PLATELET
BASO%: 1.8 % (ref 0.0–2.0)
Basophils Absolute: 0.3 10*3/uL — ABNORMAL HIGH (ref 0.0–0.1)
EOS%: 0.4 % (ref 0.0–7.0)
Eosinophils Absolute: 0.1 10*3/uL (ref 0.0–0.5)
HCT: 40.3 % (ref 34.8–46.6)
HGB: 13.5 g/dL (ref 11.6–15.9)
LYMPH%: 11.8 % — ABNORMAL LOW (ref 14.0–49.7)
MCH: 31 pg (ref 25.1–34.0)
MCHC: 33.5 g/dL (ref 31.5–36.0)
MCV: 92.4 fL (ref 79.5–101.0)
MONO#: 2.5 10*3/uL — AB (ref 0.1–0.9)
MONO%: 14.9 % — AB (ref 0.0–14.0)
NEUT%: 71.1 % (ref 38.4–76.8)
NEUTROS ABS: 11.8 10*3/uL — AB (ref 1.5–6.5)
Platelets: 240 10*3/uL (ref 145–400)
RBC: 4.36 10*6/uL (ref 3.70–5.45)
RDW: 17.8 % — ABNORMAL HIGH (ref 11.2–14.5)
WBC: 16.6 10*3/uL — AB (ref 3.9–10.3)
lymph#: 2 10*3/uL (ref 0.9–3.3)
nRBC: 4 % — ABNORMAL HIGH (ref 0–0)

## 2016-11-05 ENCOUNTER — Telehealth: Payer: Self-pay | Admitting: Medical Oncology

## 2016-11-05 NOTE — Telephone Encounter (Signed)
Reviewed with patient  a list of K+ rich foods to increase in diet including: bananas,oranges, avocado, squash, raisins, potatoes, cabbage,cauliflower, tuna,honey,beef,chicken,sardines,milk,turkey, Patient and or family voice understanding. Recent-(sat) abd pain , constipation then episode of diarrhea -took gas -x had sm BM and symptoms resolved.denies fever , she is eating [employer] and drinking . I told her to call back tomorrow if symptoms recur.

## 2016-11-10 ENCOUNTER — Telehealth: Payer: Self-pay | Admitting: Pulmonary Disease

## 2016-11-10 ENCOUNTER — Telehealth: Payer: Self-pay | Admitting: Medical Oncology

## 2016-11-10 MED ORDER — UMECLIDINIUM-VILANTEROL 62.5-25 MCG/INH IN AEPB: 1.0000 | INHALATION_SPRAY | Freq: Every day | RESPIRATORY_TRACT | 5 refills | Status: AC

## 2016-11-10 NOTE — Telephone Encounter (Signed)
Spoke with pharmacy,they stated pt called them requesting a refill of her Anoro inhaler. Last time pt was here she got samples. I have sent her rx in, nothing further is needed

## 2016-11-10 NOTE — Telephone Encounter (Signed)
Patient called me concerned that she has not heard from clinic regarding CT appointment that Dr. Julien Nordmann wanted her to have on the 20th. Spoke with Abelina Bachelor RN, desk nurse, and I was informed that CT authorized on 4/13th and she will contact central scheduling to call patient. Return call to patient and informed her to expect call from central scheduling today or tomorrow at the latest to setup appointment. Patient thanked me. All patient's questions answered and she was encouraged to contact clinic with any further questions or concerns she may have.

## 2016-11-11 ENCOUNTER — Other Ambulatory Visit (HOSPITAL_BASED_OUTPATIENT_CLINIC_OR_DEPARTMENT_OTHER): Payer: Self-pay

## 2016-11-11 DIAGNOSIS — C3492 Malignant neoplasm of unspecified part of left bronchus or lung: Secondary | ICD-10-CM

## 2016-11-11 LAB — COMPREHENSIVE METABOLIC PANEL
ALBUMIN: 3.3 g/dL — AB (ref 3.5–5.0)
ALK PHOS: 131 U/L (ref 40–150)
ALT: 8 U/L (ref 0–55)
ANION GAP: 11 meq/L (ref 3–11)
AST: 10 U/L (ref 5–34)
BILIRUBIN TOTAL: 0.27 mg/dL (ref 0.20–1.20)
BUN: 5 mg/dL — ABNORMAL LOW (ref 7.0–26.0)
CALCIUM: 10 mg/dL (ref 8.4–10.4)
CHLORIDE: 105 meq/L (ref 98–109)
CO2: 24 mEq/L (ref 22–29)
CREATININE: 0.7 mg/dL (ref 0.6–1.1)
EGFR: >90 mL/min/{1.73_m2} (ref >90)
Glucose: 110 mg/dl (ref 70–140)
Potassium: 4.7 mEq/L (ref 3.5–5.1)
Sodium: 140 mEq/L (ref 136–145)
TOTAL PROTEIN: 7.1 g/dL (ref 6.4–8.3)

## 2016-11-11 LAB — CBC WITH DIFFERENTIAL/PLATELET
BASO%: 0.9 % (ref 0.0–2.0)
Basophils Absolute: 0.1 10*3/uL (ref 0.0–0.1)
EOS%: 0.2 % (ref 0.0–7.0)
Eosinophils Absolute: 0 10*3/uL (ref 0.0–0.5)
HEMATOCRIT: 42.7 % (ref 34.8–46.6)
HEMOGLOBIN: 14.4 g/dL (ref 11.6–15.9)
LYMPH#: 1.5 10*3/uL (ref 0.9–3.3)
LYMPH%: 10.9 % — ABNORMAL LOW (ref 14.0–49.7)
MCH: 31.2 pg (ref 25.1–34.0)
MCHC: 33.6 g/dL (ref 31.5–36.0)
MCV: 93 fL (ref 79.5–101.0)
MONO#: 1.1 10*3/uL — AB (ref 0.1–0.9)
MONO%: 8 % (ref 0.0–14.0)
NEUT#: 10.8 10*3/uL — ABNORMAL HIGH (ref 1.5–6.5)
NEUT%: 80 % — ABNORMAL HIGH (ref 38.4–76.8)
PLATELETS: 281 10*3/uL (ref 145–400)
RBC: 4.6 10*6/uL (ref 3.70–5.45)
RDW: 18.2 % — ABNORMAL HIGH (ref 11.2–14.5)
WBC: 13.5 10*3/uL — ABNORMAL HIGH (ref 3.9–10.3)

## 2016-11-11 LAB — UA PROTEIN, DIPSTICK - CHCC

## 2016-11-14 ENCOUNTER — Encounter (HOSPITAL_COMMUNITY): Payer: Self-pay

## 2016-11-14 ENCOUNTER — Ambulatory Visit (HOSPITAL_COMMUNITY)
Admission: RE | Admit: 2016-11-14 | Discharge: 2016-11-14 | Disposition: A | Discharge status: Home / Self Care | Payer: Self-pay | Source: Ambulatory Visit | Attending: Internal Medicine | Admitting: Internal Medicine

## 2016-11-14 DIAGNOSIS — Z95828 Presence of other vascular implants and grafts: Secondary | ICD-10-CM

## 2016-11-14 DIAGNOSIS — Z923 Personal history of irradiation: Secondary | ICD-10-CM | POA: Insufficient documentation

## 2016-11-14 DIAGNOSIS — R918 Other nonspecific abnormal finding of lung field: Secondary | ICD-10-CM | POA: Insufficient documentation

## 2016-11-14 DIAGNOSIS — F411 Generalized anxiety disorder: Secondary | ICD-10-CM | POA: Insufficient documentation

## 2016-11-14 DIAGNOSIS — C3492 Malignant neoplasm of unspecified part of left bronchus or lung: Secondary | ICD-10-CM | POA: Insufficient documentation

## 2016-11-14 DIAGNOSIS — I2510 Atherosclerotic heart disease of native coronary artery without angina pectoris: Secondary | ICD-10-CM | POA: Insufficient documentation

## 2016-11-14 DIAGNOSIS — K829 Disease of gallbladder, unspecified: Secondary | ICD-10-CM | POA: Insufficient documentation

## 2016-11-14 DIAGNOSIS — Z5111 Encounter for antineoplastic chemotherapy: Secondary | ICD-10-CM

## 2016-11-14 MED ORDER — IOPAMIDOL (ISOVUE-300) INJECTION 61%
100.0000 mL | Freq: Once | INTRAVENOUS | Status: AC | PRN
  Administered 2016-11-14: 100 mL via INTRAVENOUS

## 2016-11-14 MED ORDER — IOPAMIDOL (ISOVUE-300) INJECTION 61%
INTRAVENOUS | Status: AC
  Filled 2016-11-14: qty 100

## 2016-11-18 ENCOUNTER — Ambulatory Visit (HOSPITAL_BASED_OUTPATIENT_CLINIC_OR_DEPARTMENT_OTHER): Payer: Self-pay

## 2016-11-18 ENCOUNTER — Ambulatory Visit (HOSPITAL_BASED_OUTPATIENT_CLINIC_OR_DEPARTMENT_OTHER): Payer: Self-pay | Admitting: Internal Medicine

## 2016-11-18 ENCOUNTER — Encounter: Payer: Self-pay | Admitting: Internal Medicine

## 2016-11-18 ENCOUNTER — Other Ambulatory Visit (HOSPITAL_BASED_OUTPATIENT_CLINIC_OR_DEPARTMENT_OTHER): Payer: Self-pay

## 2016-11-18 DIAGNOSIS — Z95828 Presence of other vascular implants and grafts: Secondary | ICD-10-CM

## 2016-11-18 DIAGNOSIS — F419 Anxiety disorder, unspecified: Secondary | ICD-10-CM

## 2016-11-18 DIAGNOSIS — C3412 Malignant neoplasm of upper lobe, left bronchus or lung: Secondary | ICD-10-CM

## 2016-11-18 DIAGNOSIS — F411 Generalized anxiety disorder: Secondary | ICD-10-CM

## 2016-11-18 DIAGNOSIS — Z5111 Encounter for antineoplastic chemotherapy: Secondary | ICD-10-CM

## 2016-11-18 DIAGNOSIS — C3492 Malignant neoplasm of unspecified part of left bronchus or lung: Secondary | ICD-10-CM

## 2016-11-18 DIAGNOSIS — Z5189 Encounter for other specified aftercare: Secondary | ICD-10-CM

## 2016-11-18 DIAGNOSIS — R000 Tachycardia, unspecified: Secondary | ICD-10-CM

## 2016-11-18 DIAGNOSIS — Z5112 Encounter for antineoplastic immunotherapy: Secondary | ICD-10-CM

## 2016-11-18 LAB — COMPREHENSIVE METABOLIC PANEL
ALT: 8 U/L (ref 0–55)
AST: 11 U/L (ref 5–34)
Albumin: 3.5 g/dL (ref 3.5–5.0)
Alkaline Phosphatase: 102 U/L (ref 40–150)
Anion Gap: 15 mEq/L — ABNORMAL HIGH (ref 3–11)
BUN: 10.6 mg/dL (ref 7.0–26.0)
CO2: 19 meq/L — AB (ref 22–29)
Calcium: 10.3 mg/dL (ref 8.4–10.4)
Chloride: 106 mEq/L (ref 98–109)
Creatinine: 0.8 mg/dL (ref 0.6–1.1)
EGFR: 82 mL/min/{1.73_m2} — AB (ref >90)
Glucose: 180 mg/dl — ABNORMAL HIGH (ref 70–140)
POTASSIUM: 4.4 meq/L (ref 3.5–5.1)
Sodium: 140 mEq/L (ref 136–145)
Total Bilirubin: <0.22 mg/dL (ref 0.20–1.20)
Total Protein: 7 g/dL (ref 6.4–8.3)

## 2016-11-18 LAB — CBC WITH DIFFERENTIAL/PLATELET
BASO%: 0.1 % (ref 0.0–2.0)
Basophils Absolute: 0 10*3/uL (ref 0.0–0.1)
EOS ABS: 0 10*3/uL (ref 0.0–0.5)
EOS%: 0 % (ref 0.0–7.0)
HCT: 40.7 % (ref 34.8–46.6)
HGB: 13.3 g/dL (ref 11.6–15.9)
LYMPH%: 5.6 % — AB (ref 14.0–49.7)
MCH: 31.3 pg (ref 25.1–34.0)
MCHC: 32.7 g/dL (ref 31.5–36.0)
MCV: 95.8 fL (ref 79.5–101.0)
MONO#: 0.5 10*3/uL (ref 0.1–0.9)
MONO%: 4.3 % (ref 0.0–14.0)
NEUT%: 90 % — ABNORMAL HIGH (ref 38.4–76.8)
NEUTROS ABS: 9.7 10*3/uL — AB (ref 1.5–6.5)
Platelets: 387 10*3/uL (ref 145–400)
RBC: 4.25 10*6/uL (ref 3.70–5.45)
RDW: 18.5 % — ABNORMAL HIGH (ref 11.2–14.5)
WBC: 10.8 10*3/uL — AB (ref 3.9–10.3)
lymph#: 0.6 10*3/uL — ABNORMAL LOW (ref 0.9–3.3)

## 2016-11-18 MED ORDER — DIPHENHYDRAMINE HCL 50 MG/ML IJ SOLN
50.0000 mg | Freq: Once | INTRAMUSCULAR | Status: AC
  Administered 2016-11-18: 50 mg via INTRAVENOUS

## 2016-11-18 MED ORDER — SODIUM CHLORIDE 0.9 % IV SOLN
INTRAVENOUS | Status: AC
  Administered 2016-11-18: 11:00:00 via INTRAVENOUS

## 2016-11-18 MED ORDER — PEGFILGRASTIM 6 MG/0.6ML ~~LOC~~ PSKT
6.0000 mg | PREFILLED_SYRINGE | Freq: Once | SUBCUTANEOUS | Status: AC
  Administered 2016-11-18: 6 mg via SUBCUTANEOUS
  Filled 2016-11-18: qty 0.6

## 2016-11-18 MED ORDER — ALPRAZOLAM 0.25 MG PO TABS: 0.2500 mg | ORAL_TABLET | Freq: Two times a day (BID) | ORAL | 0 refills | Status: DC | PRN

## 2016-11-18 MED ORDER — SODIUM CHLORIDE 0.9 % IV SOLN
Freq: Once | INTRAVENOUS | Status: AC
  Administered 2016-11-18: 11:00:00 via INTRAVENOUS

## 2016-11-18 MED ORDER — DEXAMETHASONE SODIUM PHOSPHATE 10 MG/ML IJ SOLN
10.0000 mg | Freq: Once | INTRAMUSCULAR | Status: AC
  Administered 2016-11-18: 10 mg via INTRAVENOUS

## 2016-11-18 MED ORDER — HEPARIN SOD (PORK) LOCK FLUSH 100 UNIT/ML IV SOLN
500.0000 [IU] | Freq: Once | INTRAVENOUS | Status: AC | PRN
  Administered 2016-11-18: 500 [IU]
  Filled 2016-11-18: qty 5

## 2016-11-18 MED ORDER — DEXAMETHASONE SODIUM PHOSPHATE 10 MG/ML IJ SOLN
INTRAMUSCULAR | Status: AC
  Filled 2016-11-18: qty 1

## 2016-11-18 MED ORDER — DIPHENHYDRAMINE HCL 50 MG/ML IJ SOLN
INTRAMUSCULAR | Status: AC
  Filled 2016-11-18: qty 1

## 2016-11-18 MED ORDER — DEXTROSE 5 % IV SOLN
75.0000 mg/m2 | Freq: Once | INTRAVENOUS | Status: AC
  Administered 2016-11-18: 150 mg via INTRAVENOUS
  Filled 2016-11-18: qty 15

## 2016-11-18 MED ORDER — SODIUM CHLORIDE 0.9% FLUSH
10.0000 mL | INTRAVENOUS | Status: DC | PRN
  Administered 2016-11-18: 10 mL
  Filled 2016-11-18: qty 10

## 2016-11-18 MED ORDER — SODIUM CHLORIDE 0.9 % IV SOLN
10.0000 mg/kg | Freq: Once | INTRAVENOUS | Status: AC
  Administered 2016-11-18: 900 mg via INTRAVENOUS
  Filled 2016-11-18: qty 50

## 2016-11-18 MED ORDER — ACETAMINOPHEN 325 MG PO TABS
650.0000 mg | ORAL_TABLET | Freq: Once | ORAL | Status: AC
  Administered 2016-11-18: 650 mg via ORAL

## 2016-11-18 MED ORDER — ACETAMINOPHEN 325 MG PO TABS
ORAL_TABLET | ORAL | Status: AC
  Filled 2016-11-18: qty 2

## 2016-11-18 NOTE — Patient Instructions (Signed)
[facility] Discharge Instructions for Patients Receiving Chemotherapy  Today you received the following chemotherapy agents: Cyramza and Taxotere.  To help prevent nausea and vomiting after your treatment, we encourage you to take your nausea medication as prescribed.   If you develop nausea and vomiting that is not controlled by your nausea medication, call the clinic.   BELOW ARE SYMPTOMS THAT SHOULD BE REPORTED IMMEDIATELY:  *FEVER GREATER THAN 100.5 F  *CHILLS WITH OR WITHOUT FEVER  NAUSEA AND VOMITING THAT IS NOT CONTROLLED WITH YOUR NAUSEA MEDICATION  *UNUSUAL SHORTNESS OF BREATH  *UNUSUAL BRUISING OR BLEEDING  TENDERNESS IN MOUTH AND THROAT WITH OR WITHOUT PRESENCE OF ULCERS  *URINARY PROBLEMS  *BOWEL PROBLEMS  UNUSUAL RASH Items with * indicate a potential emergency and should be followed up as soon as possible.  Feel free to call the clinic you have any questions or concerns. The clinic phone number is [phone].  Please show the [facility] at check-in to the Emergency Department and triage nurse.

## 2016-11-18 NOTE — Progress Notes (Signed)
[facility] [phone]   [phone]  OFFICE PROGRESS NOTE  Molli Barrows, FNP [place] [address]  DIAGNOSIS: Stage IV (T2a, N0, M1a) non-small cell lung cancer, adenocarcinoma presented with bilateral pulmonary lesions. This could be also consider as synchronous primary tumors.  PRIOR THERAPY:  1) Systemic chemotherapy with carboplatin for AUC of 5 and Alimta 500 MG/M2 every 3 weeks. Status post 6 cycles. 2) Treatment according to the BMS checkmate 370 clinical trial. Randomized to group A arm B1 treatment with pemetrexed (Alimta) 500 mg/m given every 3 weeks. Status post 6 cycles discontinued secondary to disease progression. 3) Tecentriq 1200 mg IV every 3 weeks status post 6 cycles discontinued secondary to significant skin rash. 4) palliative radiotherapy to the left upper lobe lung mass under the care of Dr. Lisbeth Renshaw completed on 11/16/2015.  CURRENT THERAPY: Systemic chemotherapy with docetaxel 75 MG/M2 and Cyramza 10 MG/KG every 3 weeks with Neulasta support. First dose 08/25/2016. Status post 4 cycles.  INTERVAL HISTORY: Heather Banks 66 y.o. female returns to the clinic today for follow-up visit. The patient is feeling fine with no specific complaints except for mild fatigue and she is continues to have anxiety and was a little bit emotional today after she lost her dog. She denied having any chest pain but continues to have shortness breath with exertion with mild cough and no hemoptysis. She denied having any fever or chills. She has no nausea, vomiting, diarrhea or constipation. She is tolerating her treatment with docetaxel and Cyramza fairly well and she is here today for evaluation with repeat CT scan of the chest, abdomen and pelvis before starting cycle #5.  MEDICAL HISTORY: Past Medical History:  Diagnosis Date  . Adenocarcinoma of left lung, stage 4 ([facility]) 07/09/2014  . Compression fracture of thoracic vertebra (H[facility])  04/21/2016  . COPD (chronic obstructive pulmonary disease) (HCC)    Albuterol neb as needed;Pulmicort neb daily  . Encounter for antineoplastic chemotherapy 01/21/2015  . Full code status 05/01/2015  . GERD (gastroesophageal reflux disease)    takes Pantoprazole and Zantac daily  . History of bronchitis    >5 yrs ago  . History of migraine    last one 2 wks ago  . Hx of radiation therapy 10/29/2015 to 11/16/2015   The Left upper lobe was treated to 37.5 Gy in 15 fractions at 2.5 Gy per fraction  . Lung mass   . Panic attacks    but doesn't take any meds  . Pneumonia    hx of-last time about 4+yrs ago    ALLERGIES:  is allergic to ativan [lorazepam] and cinnamon.  MEDICATIONS:  Current Outpatient Prescriptions  Medication Sig Dispense Refill  . acidophilus (RISAQUAD) CAPS capsule Take 1 capsule by mouth daily.    Marland Kitchen albuterol (PROVENTIL) (2.5 MG/3ML) 0.083% nebulizer solution Take 3 mLs (2.5 mg total) by nebulization every 6 (six) hours as needed for wheezing or shortness of breath. 75 mL 3  . ALPRAZolam (XANAX) 0.25 MG tablet Take 1 tablet (0.25 mg total) by mouth 2 (two) times daily as needed for anxiety. 40 tablet 0  . amLODipine (NORVASC) 5 MG tablet Take 1 tablet (5 mg total) by mouth daily. 90 tablet 1  . aspirin-acetaminophen-caffeine (EXCEDRIN MIGRAINE) 321-224-82 MG per tablet Take 2 tablets by mouth every 6 (six) hours as needed for headache.    . calcium carbonate (OS-CAL - DOSED IN MG OF ELEMENTAL CALCIUM) 1250 (500 Ca) MG  tablet Take 1 tablet by mouth daily with breakfast.    . cholecalciferol (VITAMIN D) 1000 units tablet Take 1,000 Units by mouth daily.    . cyanocobalamin 2000 MCG tablet Take 2,000 mcg by mouth daily.    Marland Kitchen dexamethasone (DECADRON) 4 MG tablet 2 tablets by mouth twice a day the day before, day of and day after the chemotherapy every 3 weeks. 40 tablet 1  . diphenhydrAMINE (BENADRYL) 25 mg capsule Take 25 mg by mouth every 6 (six) hours as needed for itching.  Reported on 01/19/2016    . famotidine (PEPCID) 20 MG tablet Take 1 tablet (20 mg total) by mouth daily. 15 tablet 0  . Lidocaine (ASPERCREME LIDOCAINE) 4 % PTCH Apply 1 patch topically daily as needed. Place on back as needed for pain    . lidocaine-prilocaine (EMLA) cream Apply 1 application topically as needed. Apply 1 tsp over port site 1-1.5 hours prior to chemotherapy. 30 g 0  . loratadine (CLARITIN) 10 MG tablet Take 1 tablet (10 mg total) by mouth daily. 15 tablet 0  . mirtazapine (REMERON) 30 MG tablet Take 0.5 tablets (15 mg total) by mouth at bedtime as needed (for sleep). 30 tablet 2  . prochlorperazine (COMPAZINE) 10 MG tablet Take 1 tablet (10 mg total) by mouth every 6 (six) hours as needed for nausea or vomiting. 60 tablet 0  . Tiotropium Bromide Monohydrate (SPIRIVA RESPIMAT) 2.5 MCG/ACT AERS Inhale 2 puffs into the lungs daily. 1 Inhaler 0  . umeclidinium-vilanterol (ANORO ELLIPTA) 62.5-25 MCG/INH AEPB Inhale 1 puff into the lungs daily. 30 each 5  . VENTOLIN HFA 108 (90 Base) MCG/ACT inhaler INHALE 2 PUFFS BY MOUTH EVERY 6 HOURS AS NEEDED FOR WHEEZE OR SHORTNESS OF BREATH 18 g 5  . ondansetron (ZOFRAN) 8 MG tablet Take 1 tablet (8 mg total) by mouth every 8 (eight) hours as needed for nausea or vomiting. (Patient not taking: Reported on 11/18/2016) 20 tablet 0   No current facility-administered medications for this visit.    Facility-Administered Medications Ordered in Other Visits  Medication Dose Route Frequency Provider Last Rate Last Dose  . heparin lock flush 100 unit/mL  500 Units Intracatheter Once PRN Curt Bears, MD      . sodium chloride 0.9 % injection 10 mL  10 mL Intracatheter PRN Curt Bears, MD   10 mL at 07/31/15 1635  . sodium chloride 0.9 % injection 10 mL  10 mL Intracatheter PRN Curt Bears, MD        SURGICAL HISTORY:  Past Surgical History:  Procedure Laterality Date  . ABDOMINAL HYSTERECTOMY    . APPENDECTOMY    . IR GENERIC HISTORICAL   05/02/2016   IR RADIOLOGIST EVAL & MGMT 05/02/2016 [facility]  . IR GENERIC HISTORICAL  05/08/2016   IR VERTEBROPLASTY EA ADDL (T&LS) BX INC UNI/BIL INC INJECT/IMAGING 05/08/2016 Luanne Bras, MD [facility]  . IR GENERIC HISTORICAL  05/08/2016   IR VERTEBROPLASTY CERV/THOR BX INC UNI/BIL INC/INJECT/IMAGING 05/08/2016 Luanne Bras, MD [facility]  . IR GENERIC HISTORICAL  05/29/2016   IR RADIOLOGIST EVAL & MGMT 05/29/2016 [facility]  . OOPHORECTOMY    . PORTACATH PLACEMENT  jan. 2016  . TONSILLECTOMY    . VIDEO BRONCHOSCOPY WITH ENDOBRONCHIAL NAVIGATION N/A 08/14/2014   Procedure: VIDEO BRONCHOSCOPY WITH ENDOBRONCHIAL NAVIGATION;  Surgeon: Rigoberto Noel, MD;  Location: [facility] OR;  Service: Thoracic;  Laterality: N/A;    REVIEW OF SYSTEMS:  Constitutional: positive for fatigue and weight loss Eyes:  negative Ears, nose, mouth, throat, and face: negative Respiratory: positive for cough and dyspnea on exertion Cardiovascular: positive for Tachycardic Gastrointestinal: negative Genitourinary:negative Integument/breast: negative Hematologic/lymphatic: negative Musculoskeletal:negative Neurological: negative Behavioral/Psych: positive for anxiety Endocrine: negative Allergic/Immunologic: negative   PHYSICAL EXAMINATION: General appearance: alert, cooperative, fatigued and no distress Head: Normocephalic, without obvious abnormality, atraumatic Neck: no adenopathy, no JVD, supple, symmetrical, trachea midline and thyroid not enlarged, symmetric, no tenderness/mass/nodules Lymph nodes: Cervical, supraclavicular, and axillary nodes normal. Resp: clear to auscultation bilaterally Back: symmetric, no curvature. ROM normal. No CVA tenderness. Cardio: regular rate and rhythm, S1, S2 normal, no murmur, click, rub or gallop GI: soft, non-tender; bowel sounds normal; no masses,  no organomegaly Extremities: extremities normal, atraumatic, no cyanosis or edema Neurologic: Alert and  oriented X 3, normal strength and tone. Normal symmetric reflexes. Normal coordination and gait   ECOG PERFORMANCE STATUS: 1 - Symptomatic but completely ambulatory  Blood pressure 138/85, pulse (!) 127, temperature 98.3 F (36.8 C), temperature source Oral, resp. rate 18, height '5\' 5"'$  (1.651 m), weight 186 lb 11.2 oz (84.7 kg), SpO2 97 %.  LABORATORY DATA: Lab Results  Component Value Date   WBC 10.8 (H) 11/18/2016   HGB 13.3 11/18/2016   HCT 40.7 11/18/2016   MCV 95.8 11/18/2016   PLT 387 11/18/2016      Chemistry      Component Value Date/Time   NA 140 11/11/2016 0902   K 4.7 11/11/2016 0902   CL 98 (L) 05/21/2016 0325   CO2 24 11/11/2016 0902   BUN 5.0 (L) 11/11/2016 0902   CREATININE 0.7 11/11/2016 0902      Component Value Date/Time   CALCIUM 10.0 11/11/2016 0902   ALKPHOS 131 11/11/2016 0902   AST 10 11/11/2016 0902   ALT 8 11/11/2016 0902   BILITOT 0.27 11/11/2016 0902       RADIOGRAPHIC STUDIES: Ct Chest W Contrast  Result Date: 11/14/2016 CLINICAL DATA:  Stage IV non-small-cell lung cancer. Adenocarcinoma. Status post chemotherapy and immunotherapy. EXAM: CT CHEST, ABDOMEN, AND PELVIS WITH CONTRAST TECHNIQUE: Multidetector CT imaging of the chest, abdomen and pelvis was performed following the standard protocol during bolus administration of intravenous contrast. CONTRAST:  141m ISOVUE-300 IOPAMIDOL (ISOVUE-300) INJECTION 61% COMPARISON:  08/11/2016 FINDINGS: CT CHEST FINDINGS Cardiovascular: Right Port-A-Cath which terminates at the high right atrium. Aortic and branch vessel atherosclerosis. Normal heart size, without pericardial effusion. Multivessel coronary artery atherosclerosis. No central pulmonary embolism, on this non-dedicated study. Mediastinum/Nodes: No supraclavicular adenopathy. No mediastinal or hilar adenopathy. Lungs/Pleura: No pleural fluid.  Mild centrilobular emphysema. Biapical pleural-parenchymal scarring. Right upper lobe nodularity again  identified. More laterally, this measures 1.7 x 1.5 cm on image 28/series 6. Compare 1.7 x 1.7 cm on the prior. More inferior and medial component measures 1.6 x 1.1 cm on image 33/series 6. Compare 1.6 x 1.6 cm on the prior. The sub solid right upper lobe lung lesion measures 3.3 x 2.3 cm on image 43/series 6. Compare 3.5 x 3.1 cm on the prior. Left upper lobe masslike consolidation centrally of measures 3.2 x 2.7 cm on image 21/series 6. Compare 4.3 x 3.1 cm on the prior at the same level. There is increased consolidation along the more inferior portion of this central left upper lobe opacity. Compare image 35/ series 6 today to image 46/series 7 on the prior. A 3 mm left lower lobe pulmonary nodule on image 53/series 6 is unchanged. Musculoskeletal: No acute osseous abnormality. Similar appearance of vertebral augmentation at T6-T7. CT  ABDOMEN PELVIS FINDINGS Hepatobiliary: Normal liver. Apparent mild gallbladder wall thickening is favored to be due to underdistention. No surrounding inflammation and no biliary duct dilatation. Pancreas: Moderate pancreatic atrophy, without mass or duct dilatation. Spleen: Normal in size, without focal abnormality. Adrenals/Urinary Tract: Normal adrenal glands. Right renal cysts and too small to characterize lesions. Normal left kidney other than renal vascular calcifications. Normal urinary bladder. Stomach/Bowel: Normal stomach, without wall thickening. Normal colon and terminal ileum. Normal small bowel. Vascular/Lymphatic: Advanced aortic and branch vessel atherosclerosis. Non aneurysmal infrarenal aortic dilatation at 2.7 cm. No abdominopelvic adenopathy. Reproductive: Hysterectomy.  No adnexal mass. Other: No significant free fluid. No evidence of omental or peritoneal disease. Musculoskeletal: No acute osseous abnormality. IMPRESSION: CT CHEST IMPRESSION 1. response to therapy. 2. Decreased size of right upper lobe pulmonary nodules and ground-glass lesion. 3. Decreased  masslike consolidation within the central left upper lobe. There is an area of increased density along the inferior portion of this mass which could be treatment related, especially given radiation therapy in this area. Recommend attention on follow-up. 4. No thoracic adenopathy. 5.  Age advanced coronary artery atherosclerosis. CT ABDOMEN AND PELVIS IMPRESSION 1. No acute process or evidence of metastatic disease in the abdomen or pelvis. 2. Apparent gallbladder wall thickening could be secondary to underdistention. If there are right upper quadrant symptoms, consider dedicated ultrasound. Electronically Signed   By: Abigail Miyamoto M.D.   On: 11/14/2016 17:03   Ct Abdomen Pelvis W Contrast  Result Date: 11/14/2016 CLINICAL DATA:  Stage IV non-small-cell lung cancer. Adenocarcinoma. Status post chemotherapy and immunotherapy. EXAM: CT CHEST, ABDOMEN, AND PELVIS WITH CONTRAST TECHNIQUE: Multidetector CT imaging of the chest, abdomen and pelvis was performed following the standard protocol during bolus administration of intravenous contrast. CONTRAST:  162m ISOVUE-300 IOPAMIDOL (ISOVUE-300) INJECTION 61% COMPARISON:  08/11/2016 FINDINGS: CT CHEST FINDINGS Cardiovascular: Right Port-A-Cath which terminates at the high right atrium. Aortic and branch vessel atherosclerosis. Normal heart size, without pericardial effusion. Multivessel coronary artery atherosclerosis. No central pulmonary embolism, on this non-dedicated study. Mediastinum/Nodes: No supraclavicular adenopathy. No mediastinal or hilar adenopathy. Lungs/Pleura: No pleural fluid.  Mild centrilobular emphysema. Biapical pleural-parenchymal scarring. Right upper lobe nodularity again identified. More laterally, this measures 1.7 x 1.5 cm on image 28/series 6. Compare 1.7 x 1.7 cm on the prior. More inferior and medial component measures 1.6 x 1.1 cm on image 33/series 6. Compare 1.6 x 1.6 cm on the prior. The sub solid right upper lobe lung lesion measures  3.3 x 2.3 cm on image 43/series 6. Compare 3.5 x 3.1 cm on the prior. Left upper lobe masslike consolidation centrally of measures 3.2 x 2.7 cm on image 21/series 6. Compare 4.3 x 3.1 cm on the prior at the same level. There is increased consolidation along the more inferior portion of this central left upper lobe opacity. Compare image 35/ series 6 today to image 46/series 7 on the prior. A 3 mm left lower lobe pulmonary nodule on image 53/series 6 is unchanged. Musculoskeletal: No acute osseous abnormality. Similar appearance of vertebral augmentation at T6-T7. CT ABDOMEN PELVIS FINDINGS Hepatobiliary: Normal liver. Apparent mild gallbladder wall thickening is favored to be due to underdistention. No surrounding inflammation and no biliary duct dilatation. Pancreas: Moderate pancreatic atrophy, without mass or duct dilatation. Spleen: Normal in size, without focal abnormality. Adrenals/Urinary Tract: Normal adrenal glands. Right renal cysts and too small to characterize lesions. Normal left kidney other than renal vascular calcifications. Normal urinary bladder. Stomach/Bowel: Normal stomach, without wall  thickening. Normal colon and terminal ileum. Normal small bowel. Vascular/Lymphatic: Advanced aortic and branch vessel atherosclerosis. Non aneurysmal infrarenal aortic dilatation at 2.7 cm. No abdominopelvic adenopathy. Reproductive: Hysterectomy.  No adnexal mass. Other: No significant free fluid. No evidence of omental or peritoneal disease. Musculoskeletal: No acute osseous abnormality. IMPRESSION: CT CHEST IMPRESSION 1. response to therapy. 2. Decreased size of right upper lobe pulmonary nodules and ground-glass lesion. 3. Decreased masslike consolidation within the central left upper lobe. There is an area of increased density along the inferior portion of this mass which could be treatment related, especially given radiation therapy in this area. Recommend attention on follow-up. 4. No thoracic adenopathy.  5.  Age advanced coronary artery atherosclerosis. CT ABDOMEN AND PELVIS IMPRESSION 1. No acute process or evidence of metastatic disease in the abdomen or pelvis. 2. Apparent gallbladder wall thickening could be secondary to underdistention. If there are right upper quadrant symptoms, consider dedicated ultrasound. Electronically Signed   By: Abigail Miyamoto M.D.   On: 11/14/2016 17:03    ASSESSMENT AND PLAN:  This is a very pleasant 66 years old white female with a stage IV non-small cell lung cancer, adenocarcinoma status post several chemotherapy and immunotherapy regimens with evidence for disease progression after her last treatment. She is currently on treatment with docetaxel and surrounds is status post 4 cycles and has been tolerating this treatment well except for mild nail changes as well as fatigue. The patient had repeat CT scan of the chest, abdomen and pelvis performed recently. I personally and independently reviewed the scans and discuss the results with the patient today. Her scan showed improvement of her disease. I recommended for the patient to continue her current treatment with docetaxel and surrounds and she will proceed with cycle #5 today. For the tachycardia, this is likely secondary to dehydration in the last week. I will arrange for the patient to receive 1 L of normal saline in the clinic today. For anxiety, I gave the patient a refill of Xanax. She would come back for follow-up visit in 3 weeks for evaluation with the start of cycle #6. The patient was advised to call immediately if she has any concerning symptoms in the interval. The patient voices understanding of current disease status and treatment options and is in agreement with the current care plan. All questions were answered. The patient knows to call the clinic with any problems, questions or concerns. We can certainly see the patient much sooner if necessary. Disclaimer: This note was dictated with voice  recognition software. Similar sounding words can inadvertently be transcribed and may not be corrected upon review.

## 2016-11-21 ENCOUNTER — Telehealth: Payer: Self-pay | Admitting: Internal Medicine

## 2016-11-21 NOTE — Telephone Encounter (Signed)
No additional appt needed per 4/4 los - appts already scheduled.

## 2016-11-25 ENCOUNTER — Other Ambulatory Visit (HOSPITAL_BASED_OUTPATIENT_CLINIC_OR_DEPARTMENT_OTHER): Payer: Self-pay

## 2016-11-25 DIAGNOSIS — C3412 Malignant neoplasm of upper lobe, left bronchus or lung: Secondary | ICD-10-CM

## 2016-11-25 DIAGNOSIS — C3492 Malignant neoplasm of unspecified part of left bronchus or lung: Secondary | ICD-10-CM

## 2016-11-25 LAB — COMPREHENSIVE METABOLIC PANEL
ALBUMIN: 3.1 g/dL — AB (ref 3.5–5.0)
ALK PHOS: 143 U/L (ref 40–150)
ALT: 341 U/L — AB (ref 0–55)
AST: 75 U/L — AB (ref 5–34)
Anion Gap: 12 mEq/L — ABNORMAL HIGH (ref 3–11)
BUN: 7 mg/dL (ref 7.0–26.0)
CALCIUM: 9.3 mg/dL (ref 8.4–10.4)
CO2: 25 mEq/L (ref 22–29)
CREATININE: 0.7 mg/dL (ref 0.6–1.1)
Chloride: 103 mEq/L (ref 98–109)
EGFR: >90 mL/min/{1.73_m2} (ref >90)
Glucose: 143 mg/dl — ABNORMAL HIGH (ref 70–140)
Potassium: 3.5 mEq/L (ref 3.5–5.1)
Sodium: 141 mEq/L (ref 136–145)
Total Bilirubin: 0.28 mg/dL (ref 0.20–1.20)
Total Protein: 6.3 g/dL — ABNORMAL LOW (ref 6.4–8.3)

## 2016-11-25 LAB — CBC WITH DIFFERENTIAL/PLATELET
BASO%: 1.9 % (ref 0.0–2.0)
Basophils Absolute: 0.3 10*3/uL — ABNORMAL HIGH (ref 0.0–0.1)
EOS ABS: 0.1 10*3/uL (ref 0.0–0.5)
EOS%: 0.6 % (ref 0.0–7.0)
HEMATOCRIT: 41 % (ref 34.8–46.6)
HEMOGLOBIN: 13.6 g/dL (ref 11.6–15.9)
LYMPH#: 1.5 10*3/uL (ref 0.9–3.3)
LYMPH%: 10.4 % — ABNORMAL LOW (ref 14.0–49.7)
MCH: 31.1 pg (ref 25.1–34.0)
MCHC: 33.2 g/dL (ref 31.5–36.0)
MCV: 93.8 fL (ref 79.5–101.0)
MONO#: 1.6 10*3/uL — AB (ref 0.1–0.9)
MONO%: 11 % (ref 0.0–14.0)
NEUT%: 76.1 % (ref 38.4–76.8)
NEUTROS ABS: 10.7 10*3/uL — AB (ref 1.5–6.5)
PLATELETS: 250 10*3/uL (ref 145–400)
RBC: 4.37 10*6/uL (ref 3.70–5.45)
RDW: 18 % — ABNORMAL HIGH (ref 11.2–14.5)
WBC: 14 10*3/uL — AB (ref 3.9–10.3)
nRBC: 4 % — ABNORMAL HIGH (ref 0–0)

## 2016-12-02 ENCOUNTER — Other Ambulatory Visit (HOSPITAL_BASED_OUTPATIENT_CLINIC_OR_DEPARTMENT_OTHER): Payer: Self-pay

## 2016-12-02 DIAGNOSIS — C3492 Malignant neoplasm of unspecified part of left bronchus or lung: Secondary | ICD-10-CM

## 2016-12-02 DIAGNOSIS — C3412 Malignant neoplasm of upper lobe, left bronchus or lung: Secondary | ICD-10-CM

## 2016-12-02 LAB — COMPREHENSIVE METABOLIC PANEL
ALBUMIN: 3.1 g/dL — AB (ref 3.5–5.0)
ALK PHOS: 120 U/L (ref 40–150)
ALT: 33 U/L (ref 0–55)
AST: 14 U/L (ref 5–34)
Anion Gap: 9 mEq/L (ref 3–11)
BUN: 8 mg/dL (ref 7.0–26.0)
CO2: 25 mEq/L (ref 22–29)
Calcium: 10.2 mg/dL (ref 8.4–10.4)
Chloride: 106 mEq/L (ref 98–109)
Creatinine: 0.7 mg/dL (ref 0.6–1.1)
EGFR: >90 mL/min/{1.73_m2} (ref >90)
GLUCOSE: 127 mg/dL (ref 70–140)
POTASSIUM: 4.6 meq/L (ref 3.5–5.1)
SODIUM: 140 meq/L (ref 136–145)
Total Bilirubin: 0.24 mg/dL (ref 0.20–1.20)
Total Protein: 6.9 g/dL (ref 6.4–8.3)

## 2016-12-02 LAB — CBC WITH DIFFERENTIAL/PLATELET
BASO%: 1 % (ref 0.0–2.0)
BASOS ABS: 0.1 10*3/uL (ref 0.0–0.1)
EOS%: 0.2 % (ref 0.0–7.0)
Eosinophils Absolute: 0 10*3/uL (ref 0.0–0.5)
HCT: 40.7 % (ref 34.8–46.6)
HEMOGLOBIN: 13.5 g/dL (ref 11.6–15.9)
LYMPH%: 11.2 % — ABNORMAL LOW (ref 14.0–49.7)
MCH: 31.5 pg (ref 25.1–34.0)
MCHC: 33.1 g/dL (ref 31.5–36.0)
MCV: 95.1 fL (ref 79.5–101.0)
MONO#: 1.4 10*3/uL — ABNORMAL HIGH (ref 0.1–0.9)
MONO%: 11.6 % (ref 0.0–14.0)
NEUT%: 76 % (ref 38.4–76.8)
NEUTROS ABS: 9.2 10*3/uL — AB (ref 1.5–6.5)
Platelets: 302 10*3/uL (ref 145–400)
RBC: 4.28 10*6/uL (ref 3.70–5.45)
RDW: 18.9 % — AB (ref 11.2–14.5)
WBC: 12.1 10*3/uL — AB (ref 3.9–10.3)
lymph#: 1.4 10*3/uL (ref 0.9–3.3)

## 2016-12-02 LAB — TECHNOLOGIST REVIEW: Technologist Review: 1

## 2016-12-02 LAB — UA PROTEIN, DIPSTICK - CHCC: Protein, ur: <30 mg/dL

## 2016-12-09 ENCOUNTER — Ambulatory Visit (HOSPITAL_BASED_OUTPATIENT_CLINIC_OR_DEPARTMENT_OTHER): Payer: Self-pay | Admitting: Internal Medicine

## 2016-12-09 ENCOUNTER — Other Ambulatory Visit: Payer: Self-pay | Admitting: Internal Medicine

## 2016-12-09 ENCOUNTER — Other Ambulatory Visit (HOSPITAL_BASED_OUTPATIENT_CLINIC_OR_DEPARTMENT_OTHER): Payer: Self-pay

## 2016-12-09 ENCOUNTER — Ambulatory Visit: Payer: Self-pay

## 2016-12-09 ENCOUNTER — Ambulatory Visit (HOSPITAL_BASED_OUTPATIENT_CLINIC_OR_DEPARTMENT_OTHER): Payer: Self-pay

## 2016-12-09 ENCOUNTER — Telehealth: Payer: Self-pay | Admitting: Internal Medicine

## 2016-12-09 ENCOUNTER — Encounter: Payer: Self-pay | Admitting: Internal Medicine

## 2016-12-09 VITALS — BP 143/91 | HR 98

## 2016-12-09 DIAGNOSIS — I10 Essential (primary) hypertension: Secondary | ICD-10-CM

## 2016-12-09 DIAGNOSIS — Z5111 Encounter for antineoplastic chemotherapy: Secondary | ICD-10-CM

## 2016-12-09 DIAGNOSIS — C3492 Malignant neoplasm of unspecified part of left bronchus or lung: Secondary | ICD-10-CM

## 2016-12-09 DIAGNOSIS — Z95828 Presence of other vascular implants and grafts: Secondary | ICD-10-CM

## 2016-12-09 DIAGNOSIS — C3490 Malignant neoplasm of unspecified part of unspecified bronchus or lung: Secondary | ICD-10-CM

## 2016-12-09 DIAGNOSIS — Z5112 Encounter for antineoplastic immunotherapy: Secondary | ICD-10-CM

## 2016-12-09 DIAGNOSIS — F419 Anxiety disorder, unspecified: Secondary | ICD-10-CM

## 2016-12-09 DIAGNOSIS — F411 Generalized anxiety disorder: Secondary | ICD-10-CM

## 2016-12-09 DIAGNOSIS — Z5189 Encounter for other specified aftercare: Secondary | ICD-10-CM

## 2016-12-09 LAB — CBC WITH DIFFERENTIAL/PLATELET
BASO%: 0.5 % (ref 0.0–2.0)
BASOS ABS: 0 10*3/uL (ref 0.0–0.1)
EOS ABS: 0 10*3/uL (ref 0.0–0.5)
EOS%: 0 % (ref 0.0–7.0)
HEMATOCRIT: 40.3 % (ref 34.8–46.6)
HEMOGLOBIN: 13.4 g/dL (ref 11.6–15.9)
LYMPH#: 0.6 10*3/uL — AB (ref 0.9–3.3)
LYMPH%: 9 % — ABNORMAL LOW (ref 14.0–49.7)
MCH: 31.9 pg (ref 25.1–34.0)
MCHC: 33.2 g/dL (ref 31.5–36.0)
MCV: 96.2 fL (ref 79.5–101.0)
MONO#: 0.5 10*3/uL (ref 0.1–0.9)
MONO%: 7.1 % (ref 0.0–14.0)
NEUT#: 5.9 10*3/uL (ref 1.5–6.5)
NEUT%: 83.4 % — ABNORMAL HIGH (ref 38.4–76.8)
Platelets: 350 10*3/uL (ref 145–400)
RBC: 4.18 10*6/uL (ref 3.70–5.45)
RDW: 18.6 % — AB (ref 11.2–14.5)
WBC: 7.1 10*3/uL (ref 3.9–10.3)

## 2016-12-09 LAB — COMPREHENSIVE METABOLIC PANEL
ALBUMIN: 3.4 g/dL — AB (ref 3.5–5.0)
ALK PHOS: 110 U/L (ref 40–150)
ALT: 12 U/L (ref 0–55)
AST: 11 U/L (ref 5–34)
Anion Gap: 15 mEq/L — ABNORMAL HIGH (ref 3–11)
BUN: 9.6 mg/dL (ref 7.0–26.0)
CALCIUM: 10 mg/dL (ref 8.4–10.4)
CO2: 21 mEq/L — ABNORMAL LOW (ref 22–29)
Chloride: 103 mEq/L (ref 98–109)
Creatinine: 0.7 mg/dL (ref 0.6–1.1)
EGFR: 89 mL/min/{1.73_m2} — AB (ref >90)
Glucose: 156 mg/dl — ABNORMAL HIGH (ref 70–140)
POTASSIUM: 4.3 meq/L (ref 3.5–5.1)
Sodium: 139 mEq/L (ref 136–145)
Total Bilirubin: 0.26 mg/dL (ref 0.20–1.20)
Total Protein: 7.1 g/dL (ref 6.4–8.3)

## 2016-12-09 MED ORDER — PEGFILGRASTIM 6 MG/0.6ML ~~LOC~~ PSKT
6.0000 mg | PREFILLED_SYRINGE | Freq: Once | SUBCUTANEOUS | Status: AC
  Administered 2016-12-09: 6 mg via SUBCUTANEOUS
  Filled 2016-12-09: qty 0.6

## 2016-12-09 MED ORDER — ACETAMINOPHEN 325 MG PO TABS
650.0000 mg | ORAL_TABLET | Freq: Once | ORAL | Status: AC
  Administered 2016-12-09: 650 mg via ORAL

## 2016-12-09 MED ORDER — HEPARIN SOD (PORK) LOCK FLUSH 100 UNIT/ML IV SOLN
500.0000 [IU] | Freq: Once | INTRAVENOUS | Status: AC | PRN
Start: 2016-12-09 — End: 2016-12-09
  Administered 2016-12-09: 500 [IU]
  Filled 2016-12-09: qty 5

## 2016-12-09 MED ORDER — ALPRAZOLAM 0.25 MG PO TABS: 0.2500 mg | ORAL_TABLET | Freq: Two times a day (BID) | ORAL | 0 refills | Status: DC | PRN

## 2016-12-09 MED ORDER — SODIUM CHLORIDE 0.9 % IV SOLN
10.0000 mg/kg | Freq: Once | INTRAVENOUS | Status: AC
  Administered 2016-12-09: 900 mg via INTRAVENOUS
  Filled 2016-12-09: qty 50

## 2016-12-09 MED ORDER — DEXAMETHASONE SODIUM PHOSPHATE 10 MG/ML IJ SOLN
INTRAMUSCULAR | Status: AC
  Filled 2016-12-09: qty 1

## 2016-12-09 MED ORDER — SODIUM CHLORIDE 0.9 % IV SOLN
Freq: Once | INTRAVENOUS | Status: AC
  Administered 2016-12-09: 10:00:00 via INTRAVENOUS

## 2016-12-09 MED ORDER — DIPHENHYDRAMINE HCL 50 MG/ML IJ SOLN
INTRAMUSCULAR | Status: AC
  Filled 2016-12-09: qty 1

## 2016-12-09 MED ORDER — DEXAMETHASONE SODIUM PHOSPHATE 10 MG/ML IJ SOLN
10.0000 mg | Freq: Once | INTRAMUSCULAR | Status: AC
  Administered 2016-12-09: 10 mg via INTRAVENOUS

## 2016-12-09 MED ORDER — DIPHENHYDRAMINE HCL 50 MG/ML IJ SOLN
50.0000 mg | Freq: Once | INTRAMUSCULAR | Status: AC
  Administered 2016-12-09: 50 mg via INTRAVENOUS

## 2016-12-09 MED ORDER — SODIUM CHLORIDE 0.9% FLUSH
10.0000 mL | INTRAVENOUS | Status: DC | PRN
  Administered 2016-12-09: 10 mL
  Filled 2016-12-09: qty 10

## 2016-12-09 MED ORDER — DEXTROSE 5 % IV SOLN
65.0000 mg/m2 | Freq: Once | INTRAVENOUS | Status: AC
  Administered 2016-12-09: 130 mg via INTRAVENOUS
  Filled 2016-12-09: qty 13

## 2016-12-09 MED ORDER — ACETAMINOPHEN 325 MG PO TABS
ORAL_TABLET | ORAL | Status: AC
  Filled 2016-12-09: qty 2

## 2016-12-09 NOTE — Patient Instructions (Signed)
[facility] Discharge Instructions for Patients Receiving Chemotherapy  Today you received the following chemotherapy agents: Taxotere and Cyramza   To help prevent nausea and vomiting after your treatment, we encourage you to take your nausea medication as directed.    If you develop nausea and vomiting that is not controlled by your nausea medication, call the clinic.   BELOW ARE SYMPTOMS THAT SHOULD BE REPORTED IMMEDIATELY:  *FEVER GREATER THAN 100.5 F  *CHILLS WITH OR WITHOUT FEVER  NAUSEA AND VOMITING THAT IS NOT CONTROLLED WITH YOUR NAUSEA MEDICATION  *UNUSUAL SHORTNESS OF BREATH  *UNUSUAL BRUISING OR BLEEDING  TENDERNESS IN MOUTH AND THROAT WITH OR WITHOUT PRESENCE OF ULCERS  *URINARY PROBLEMS  *BOWEL PROBLEMS  UNUSUAL RASH Items with * indicate a potential emergency and should be followed up as soon as possible.  Feel free to call the clinic you have any questions or concerns. The clinic phone number is [phone].  Please show the CHEMO ALERT CARD at check-in to the Emergency Department and triage nurse.   

## 2016-12-09 NOTE — Telephone Encounter (Signed)
Gave patient AVS and calender per 5/15 - All appts already scheduled - additional labs appts added.

## 2016-12-09 NOTE — Progress Notes (Signed)
Nutrition Assessment   Reason for Assessment:   Patient identified on Malnutrition Screening Report for poor appetite and weight loss  ASSESSMENT:  66 year old female with stage IV non-small cell lung cancer.  Patient seen in infusion this am.  Noted palliative radiation completed on 11/16/15.    Patient reports appetite has been decreased after last chemotherapy on 4/24.  Patient reports appetite is especially decreased several days after chemotherapy up to a week after infusion.  Reports that she just has no energy and does not want to eat. Also reports has trouble with texture of foods (ie bread, sometimes hamburger).  Reports that she drinks 2 boost shakes per day (thinks it is the calorie smart boost).  Also likes egg salad, pork and beans, italian ice, chicken.  Use frozen meals that are quick and easy to heat up due to fatigue following chemotherapy.   Nutrition Focused Physical Exam: deferred  Medications: Vit D, calcium, pepcid, remeron, compazine, zofran  Labs: glucose 156  Anthropometrics:   Height: 65 inches Weight: 182 lb today UBW: 150-155 lb prior to treatment and reports started taking steriods and weight increased Noted 10# weight loss in the last 2 months, 5% weight loss in 2 months BMI: 31   Estimated Energy Needs  Kcals: 1800-2000 calories/d Protein: 86-114 g/d Fluid: 2 L/d  NUTRITION DIAGNOSIS: Inadequate oral intake related to cancer and cancer related treatments as evidenced by 5% weight loss in 2 months, poor po intake   MALNUTRITION DIAGNOSIS: Will continue to assess   INTERVENTION:   Discussed ways to increase calories and protein.  Fact sheet given on soft moist protein foods.  \ Discussed oral nutrition supplements with 350 calories or more and encouraged these vs lower calorie supplements.  Coupons given.     MONITORING, EVALUATION, GOAL: Patient will consume adequate calories and protein to meet maintain lean muscle mass   NEXT VISIT: June  26th during infusion  Jenesis Suchy B. Zenia Resides, [place], [place] Registered Dietitian [phone] (pager)

## 2016-12-09 NOTE — Progress Notes (Signed)
[facility] [phone]   [phone]  OFFICE PROGRESS NOTE  Scot Jun, FNP [place] [address]  DIAGNOSIS: Stage IV (T2a, N0, M1a) non-small cell lung cancer, adenocarcinoma presented with bilateral pulmonary lesions. This could be also consider as synchronous primary tumors.  PRIOR THERAPY:  1) Systemic chemotherapy with carboplatin for AUC of 5 and Alimta 500 MG/M2 every 3 weeks. Status post 6 cycles. 2) Treatment according to the BMS checkmate 370 clinical trial. Randomized to group A arm B1 treatment with pemetrexed (Alimta) 500 mg/m given every 3 weeks. Status post 6 cycles discontinued secondary to disease progression. 3) Tecentriq 1200 mg IV every 3 weeks status post 6 cycles discontinued secondary to significant skin rash. 4) palliative radiotherapy to the left upper lobe lung mass under the care of Dr. Lisbeth Renshaw completed on 11/16/2015.  CURRENT THERAPY: Systemic chemotherapy with docetaxel 75 MG/M2 and Cyramza 10 MG/KG every 3 weeks with Neulasta support. First dose 08/25/2016. Status post 5 cycles. Starting from cycle #6, I would reduce the dose of docetaxel to 65 MG/M2 secondary to intolerance.  INTERVAL HISTORY: Heather Banks 66 y.o. female returns to the clinic today for follow-up visit. The patient is feeling more fatigued and tired with her chemotherapy. She denied having any chest pain but has shortness of breath with exertion with mild cough and no hemoptysis. She started having mild peripheral neuropathy in the fingers. She denied having any recent weight loss or night sweats. She has no nausea, vomiting, diarrhea or constipation. She is here today for evaluation before starting cycle #6.   MEDICAL HISTORY: Past Medical History:  Diagnosis Date  . Adenocarcinoma of left lung, stage 4 ([facility]) 07/09/2014  . Compression fracture of thoracic vertebra ([facility]) 04/21/2016  . COPD (chronic obstructive pulmonary disease)  ([facility])    Albuterol neb as needed;Pulmicort neb daily  . Encounter for antineoplastic chemotherapy 01/21/2015  . Full code status 05/01/2015  . GERD (gastroesophageal reflux disease)    takes Pantoprazole and Zantac daily  . History of bronchitis    >5 yrs ago  . History of migraine    last one 2 wks ago  . Hx of radiation therapy 10/29/2015 to 11/16/2015   The Left upper lobe was treated to 37.5 Gy in 15 fractions at 2.5 Gy per fraction  . Lung mass   . Panic attacks    but doesn't take any meds  . Pneumonia    hx of-last time about 4+yrs ago    ALLERGIES:  is allergic to ativan [lorazepam] and cinnamon.  MEDICATIONS:  Current Outpatient Prescriptions  Medication Sig Dispense Refill  . acidophilus (RISAQUAD) CAPS capsule Take 1 capsule by mouth daily.    Marland Kitchen albuterol (PROVENTIL) (2.5 MG/3ML) 0.083% nebulizer solution Take 3 mLs (2.5 mg total) by nebulization every 6 (six) hours as needed for wheezing or shortness of breath. 75 mL 3  . ALPRAZolam (XANAX) 0.25 MG tablet Take 1 tablet (0.25 mg total) by mouth 2 (two) times daily as needed for anxiety. 40 tablet 0  . amLODipine (NORVASC) 5 MG tablet Take 1 tablet (5 mg total) by mouth daily. 90 tablet 1  . aspirin-acetaminophen-caffeine (EXCEDRIN MIGRAINE) 938-101-75 MG per tablet Take 2 tablets by mouth every 6 (six) hours as needed for headache.    . calcium carbonate (OS-CAL - DOSED IN MG OF ELEMENTAL CALCIUM) 1250 (500 Ca) MG tablet Take 1 tablet by mouth daily with breakfast.    .  cholecalciferol (VITAMIN D) 1000 units tablet Take 1,000 Units by mouth daily.    . cyanocobalamin 2000 MCG tablet Take 2,000 mcg by mouth daily.    Marland Kitchen dexamethasone (DECADRON) 4 MG tablet 2 tablets by mouth twice a day the day before, day of and day after the chemotherapy every 3 weeks. 40 tablet 1  . diphenhydrAMINE (BENADRYL) 25 mg capsule Take 25 mg by mouth every 6 (six) hours as needed for itching. Reported on 01/19/2016    . famotidine (PEPCID) 20 MG  tablet Take 1 tablet (20 mg total) by mouth daily. 15 tablet 0  . Lidocaine (ASPERCREME LIDOCAINE) 4 % PTCH Apply 1 patch topically daily as needed. Place on back as needed for pain    . lidocaine-prilocaine (EMLA) cream Apply 1 application topically as needed. Apply 1 tsp over port site 1-1.5 hours prior to chemotherapy. 30 g 0  . loratadine (CLARITIN) 10 MG tablet Take 1 tablet (10 mg total) by mouth daily. 15 tablet 0  . mirtazapine (REMERON) 30 MG tablet Take 0.5 tablets (15 mg total) by mouth at bedtime as needed (for sleep). 30 tablet 2  . ondansetron (ZOFRAN) 8 MG tablet Take 1 tablet (8 mg total) by mouth every 8 (eight) hours as needed for nausea or vomiting. (Patient not taking: Reported on 11/18/2016) 20 tablet 0  . prochlorperazine (COMPAZINE) 10 MG tablet Take 1 tablet (10 mg total) by mouth every 6 (six) hours as needed for nausea or vomiting. 60 tablet 0  . Tiotropium Bromide Monohydrate (SPIRIVA RESPIMAT) 2.5 MCG/ACT AERS Inhale 2 puffs into the lungs daily. 1 Inhaler 0  . umeclidinium-vilanterol (ANORO ELLIPTA) 62.5-25 MCG/INH AEPB Inhale 1 puff into the lungs daily. 30 each 5  . VENTOLIN HFA 108 (90 Base) MCG/ACT inhaler INHALE 2 PUFFS BY MOUTH EVERY 6 HOURS AS NEEDED FOR WHEEZE OR SHORTNESS OF BREATH 18 g 5   No current facility-administered medications for this visit.    Facility-Administered Medications Ordered in Other Visits  Medication Dose Route Frequency Provider Last Rate Last Dose  . heparin lock flush 100 unit/mL  500 Units Intracatheter Once PRN Curt Bears, MD      . sodium chloride 0.9 % injection 10 mL  10 mL Intracatheter PRN Curt Bears, MD   10 mL at 07/31/15 1635  . sodium chloride 0.9 % injection 10 mL  10 mL Intracatheter PRN Curt Bears, MD        SURGICAL HISTORY:  Past Surgical History:  Procedure Laterality Date  . ABDOMINAL HYSTERECTOMY    . APPENDECTOMY    . IR GENERIC HISTORICAL  05/02/2016   IR RADIOLOGIST EVAL & MGMT 05/02/2016  [facility]  . IR GENERIC HISTORICAL  05/08/2016   IR VERTEBROPLASTY EA ADDL (T&LS) BX INC UNI/BIL INC INJECT/IMAGING 05/08/2016 Luanne Bras, MD [facility]  . IR GENERIC HISTORICAL  05/08/2016   IR VERTEBROPLASTY CERV/THOR BX INC UNI/BIL INC/INJECT/IMAGING 05/08/2016 Luanne Bras, MD [facility]  . IR GENERIC HISTORICAL  05/29/2016   IR RADIOLOGIST EVAL & MGMT 05/29/2016 [facility]  . OOPHORECTOMY    . PORTACATH PLACEMENT  jan. 2016  . TONSILLECTOMY    . VIDEO BRONCHOSCOPY WITH ENDOBRONCHIAL NAVIGATION N/A 08/14/2014   Procedure: VIDEO BRONCHOSCOPY WITH ENDOBRONCHIAL NAVIGATION;  Surgeon: Rigoberto Noel, MD;  Location: [facility];  Service: Thoracic;  Laterality: N/A;    REVIEW OF SYSTEMS:  Constitutional: positive for fatigue Eyes: negative Ears, nose, mouth, throat, and face: negative Respiratory: positive for cough and dyspnea on exertion  Cardiovascular: negative Gastrointestinal: negative Genitourinary:negative Integument/breast: negative Hematologic/lymphatic: negative Musculoskeletal:negative Neurological: negative Behavioral/Psych: positive for anxiety Endocrine: negative Allergic/Immunologic: negative   PHYSICAL EXAMINATION: General appearance: alert, cooperative, fatigued and no distress Head: Normocephalic, without obvious abnormality, atraumatic Neck: no adenopathy, no JVD, supple, symmetrical, trachea midline and thyroid not enlarged, symmetric, no tenderness/mass/nodules Lymph nodes: Cervical, supraclavicular, and axillary nodes normal. Resp: clear to auscultation bilaterally Back: symmetric, no curvature. ROM normal. No CVA tenderness. Cardio: regular rate and rhythm, S1, S2 normal, no murmur, click, rub or gallop GI: soft, non-tender; bowel sounds normal; no masses,  no organomegaly Extremities: extremities normal, atraumatic, no cyanosis or edema Neurologic: Alert and oriented X 3, normal strength and tone. Normal symmetric reflexes. Normal  coordination and gait   ECOG PERFORMANCE STATUS: 1 - Symptomatic but completely ambulatory  Blood pressure (!) 139/104, pulse 65, temperature 98.6 F (37 C), temperature source Oral, resp. rate 16, height '5\' 5"'$  (1.651 m), weight 182 lb 4.8 oz (82.7 kg), SpO2 95 %.  LABORATORY DATA: Lab Results  Component Value Date   WBC 7.1 12/09/2016   HGB 13.4 12/09/2016   HCT 40.3 12/09/2016   MCV 96.2 12/09/2016   PLT 350 12/09/2016      Chemistry      Component Value Date/Time   NA 140 12/02/2016 0848   K 4.6 12/02/2016 0848   CL 98 (L) 05/21/2016 0325   CO2 25 12/02/2016 0848   BUN 8.0 12/02/2016 0848   CREATININE 0.7 12/02/2016 0848      Component Value Date/Time   CALCIUM 10.2 12/02/2016 0848   ALKPHOS 120 12/02/2016 0848   AST 14 12/02/2016 0848   ALT 33 12/02/2016 0848   BILITOT 0.24 12/02/2016 0848       RADIOGRAPHIC STUDIES: Ct Chest W Contrast  Result Date: 11/14/2016 CLINICAL DATA:  Stage IV non-small-cell lung cancer. Adenocarcinoma. Status post chemotherapy and immunotherapy. EXAM: CT CHEST, ABDOMEN, AND PELVIS WITH CONTRAST TECHNIQUE: Multidetector CT imaging of the chest, abdomen and pelvis was performed following the standard protocol during bolus administration of intravenous contrast. CONTRAST:  152m ISOVUE-300 IOPAMIDOL (ISOVUE-300) INJECTION 61% COMPARISON:  08/11/2016 FINDINGS: CT CHEST FINDINGS Cardiovascular: Right Port-A-Cath which terminates at the high right atrium. Aortic and branch vessel atherosclerosis. Normal heart size, without pericardial effusion. Multivessel coronary artery atherosclerosis. No central pulmonary embolism, on this non-dedicated study. Mediastinum/Nodes: No supraclavicular adenopathy. No mediastinal or hilar adenopathy. Lungs/Pleura: No pleural fluid.  Mild centrilobular emphysema. Biapical pleural-parenchymal scarring. Right upper lobe nodularity again identified. More laterally, this measures 1.7 x 1.5 cm on image 28/series 6. Compare 1.7  x 1.7 cm on the prior. More inferior and medial component measures 1.6 x 1.1 cm on image 33/series 6. Compare 1.6 x 1.6 cm on the prior. The sub solid right upper lobe lung lesion measures 3.3 x 2.3 cm on image 43/series 6. Compare 3.5 x 3.1 cm on the prior. Left upper lobe masslike consolidation centrally of measures 3.2 x 2.7 cm on image 21/series 6. Compare 4.3 x 3.1 cm on the prior at the same level. There is increased consolidation along the more inferior portion of this central left upper lobe opacity. Compare image 35/ series 6 today to image 46/series 7 on the prior. A 3 mm left lower lobe pulmonary nodule on image 53/series 6 is unchanged. Musculoskeletal: No acute osseous abnormality. Similar appearance of vertebral augmentation at T6-T7. CT ABDOMEN PELVIS FINDINGS Hepatobiliary: Normal liver. Apparent mild gallbladder wall thickening is favored to be due to underdistention. No surrounding  inflammation and no biliary duct dilatation. Pancreas: Moderate pancreatic atrophy, without mass or duct dilatation. Spleen: Normal in size, without focal abnormality. Adrenals/Urinary Tract: Normal adrenal glands. Right renal cysts and too small to characterize lesions. Normal left kidney other than renal vascular calcifications. Normal urinary bladder. Stomach/Bowel: Normal stomach, without wall thickening. Normal colon and terminal ileum. Normal small bowel. Vascular/Lymphatic: Advanced aortic and branch vessel atherosclerosis. Non aneurysmal infrarenal aortic dilatation at 2.7 cm. No abdominopelvic adenopathy. Reproductive: Hysterectomy.  No adnexal mass. Other: No significant free fluid. No evidence of omental or peritoneal disease. Musculoskeletal: No acute osseous abnormality. IMPRESSION: CT CHEST IMPRESSION 1. response to therapy. 2. Decreased size of right upper lobe pulmonary nodules and ground-glass lesion. 3. Decreased masslike consolidation within the central left upper lobe. There is an area of increased  density along the inferior portion of this mass which could be treatment related, especially given radiation therapy in this area. Recommend attention on follow-up. 4. No thoracic adenopathy. 5.  Age advanced coronary artery atherosclerosis. CT ABDOMEN AND PELVIS IMPRESSION 1. No acute process or evidence of metastatic disease in the abdomen or pelvis. 2. Apparent gallbladder wall thickening could be secondary to underdistention. If there are right upper quadrant symptoms, consider dedicated ultrasound. Electronically Signed   By: Abigail Miyamoto M.D.   On: 11/14/2016 17:03   Ct Abdomen Pelvis W Contrast  Result Date: 11/14/2016 CLINICAL DATA:  Stage IV non-small-cell lung cancer. Adenocarcinoma. Status post chemotherapy and immunotherapy. EXAM: CT CHEST, ABDOMEN, AND PELVIS WITH CONTRAST TECHNIQUE: Multidetector CT imaging of the chest, abdomen and pelvis was performed following the standard protocol during bolus administration of intravenous contrast. CONTRAST:  1106m ISOVUE-300 IOPAMIDOL (ISOVUE-300) INJECTION 61% COMPARISON:  08/11/2016 FINDINGS: CT CHEST FINDINGS Cardiovascular: Right Port-A-Cath which terminates at the high right atrium. Aortic and branch vessel atherosclerosis. Normal heart size, without pericardial effusion. Multivessel coronary artery atherosclerosis. No central pulmonary embolism, on this non-dedicated study. Mediastinum/Nodes: No supraclavicular adenopathy. No mediastinal or hilar adenopathy. Lungs/Pleura: No pleural fluid.  Mild centrilobular emphysema. Biapical pleural-parenchymal scarring. Right upper lobe nodularity again identified. More laterally, this measures 1.7 x 1.5 cm on image 28/series 6. Compare 1.7 x 1.7 cm on the prior. More inferior and medial component measures 1.6 x 1.1 cm on image 33/series 6. Compare 1.6 x 1.6 cm on the prior. The sub solid right upper lobe lung lesion measures 3.3 x 2.3 cm on image 43/series 6. Compare 3.5 x 3.1 cm on the prior. Left upper lobe  masslike consolidation centrally of measures 3.2 x 2.7 cm on image 21/series 6. Compare 4.3 x 3.1 cm on the prior at the same level. There is increased consolidation along the more inferior portion of this central left upper lobe opacity. Compare image 35/ series 6 today to image 46/series 7 on the prior. A 3 mm left lower lobe pulmonary nodule on image 53/series 6 is unchanged. Musculoskeletal: No acute osseous abnormality. Similar appearance of vertebral augmentation at T6-T7. CT ABDOMEN PELVIS FINDINGS Hepatobiliary: Normal liver. Apparent mild gallbladder wall thickening is favored to be due to underdistention. No surrounding inflammation and no biliary duct dilatation. Pancreas: Moderate pancreatic atrophy, without mass or duct dilatation. Spleen: Normal in size, without focal abnormality. Adrenals/Urinary Tract: Normal adrenal glands. Right renal cysts and too small to characterize lesions. Normal left kidney other than renal vascular calcifications. Normal urinary bladder. Stomach/Bowel: Normal stomach, without wall thickening. Normal colon and terminal ileum. Normal small bowel. Vascular/Lymphatic: Advanced aortic and branch vessel atherosclerosis. Non aneurysmal infrarenal aortic  dilatation at 2.7 cm. No abdominopelvic adenopathy. Reproductive: Hysterectomy.  No adnexal mass. Other: No significant free fluid. No evidence of omental or peritoneal disease. Musculoskeletal: No acute osseous abnormality. IMPRESSION: CT CHEST IMPRESSION 1. response to therapy. 2. Decreased size of right upper lobe pulmonary nodules and ground-glass lesion. 3. Decreased masslike consolidation within the central left upper lobe. There is an area of increased density along the inferior portion of this mass which could be treatment related, especially given radiation therapy in this area. Recommend attention on follow-up. 4. No thoracic adenopathy. 5.  Age advanced coronary artery atherosclerosis. CT ABDOMEN AND PELVIS IMPRESSION 1.  No acute process or evidence of metastatic disease in the abdomen or pelvis. 2. Apparent gallbladder wall thickening could be secondary to underdistention. If there are right upper quadrant symptoms, consider dedicated ultrasound. Electronically Signed   By: Abigail Miyamoto M.D.   On: 11/14/2016 17:03    ASSESSMENT AND PLAN:  This is a very pleasant 66 years old white female with a stage IV non-small cell lung cancer, adenocarcinoma status post several systemic chemotherapy regimens as well as immunotherapy. She is currently on treatment with docetaxel and Cyramza status post 5 cycles. Chest pain today to the treatment well except for increasing fatigue and weakness. I recommended for the patient to proceed with cycle #6 today as scheduled but I will reduce the dose of docetaxel to 65 MG/M2 starting from cycle #6 secondary to intolerance. For anxiety, I gave her a refill for Xanax. For hypertension, the patient will continue on her current blood pressure medication for now. I will see her back for follow-up visit in 3 weeks for evaluation before starting cycle #7. She was advised to call immediately if she has any concerning symptoms in the interval. The patient voices understanding of current disease status and treatment options and is in agreement with the current care plan. All questions were answered. The patient knows to call the clinic with any problems, questions or concerns. We can certainly see the patient much sooner if necessary. Disclaimer: This note was dictated with voice recognition software. Similar sounding words can inadvertently be transcribed and may not be corrected upon review.

## 2016-12-16 ENCOUNTER — Other Ambulatory Visit (HOSPITAL_BASED_OUTPATIENT_CLINIC_OR_DEPARTMENT_OTHER): Payer: Self-pay

## 2016-12-16 DIAGNOSIS — C3490 Malignant neoplasm of unspecified part of unspecified bronchus or lung: Secondary | ICD-10-CM

## 2016-12-16 DIAGNOSIS — C3492 Malignant neoplasm of unspecified part of left bronchus or lung: Secondary | ICD-10-CM

## 2016-12-16 LAB — CBC WITH DIFFERENTIAL/PLATELET
BASO%: 0.3 % (ref 0.0–2.0)
BASOS ABS: 0.1 10*3/uL (ref 0.0–0.1)
EOS%: 0.2 % (ref 0.0–7.0)
Eosinophils Absolute: 0.1 10*3/uL (ref 0.0–0.5)
HCT: 40.9 % (ref 34.8–46.6)
HEMOGLOBIN: 13.7 g/dL (ref 11.6–15.9)
LYMPH%: 7.5 % — ABNORMAL LOW (ref 14.0–49.7)
MCH: 32 pg (ref 25.1–34.0)
MCHC: 33.4 g/dL (ref 31.5–36.0)
MCV: 95.7 fL (ref 79.5–101.0)
MONO#: 2.1 10*3/uL — ABNORMAL HIGH (ref 0.1–0.9)
MONO%: 8 % (ref 0.0–14.0)
NEUT#: 21.7 10*3/uL — ABNORMAL HIGH (ref 1.5–6.5)
NEUT%: 84 % — ABNORMAL HIGH (ref 38.4–76.8)
Platelets: 246 10*3/uL (ref 145–400)
RBC: 4.28 10*6/uL (ref 3.70–5.45)
RDW: 17.9 % — AB (ref 11.2–14.5)
WBC: 25.9 10*3/uL — ABNORMAL HIGH (ref 3.9–10.3)
lymph#: 2 10*3/uL (ref 0.9–3.3)

## 2016-12-16 LAB — COMPREHENSIVE METABOLIC PANEL
ALT: 17 U/L (ref 0–55)
ANION GAP: 12 meq/L — AB (ref 3–11)
AST: 21 U/L (ref 5–34)
Albumin: 3.3 g/dL — ABNORMAL LOW (ref 3.5–5.0)
Alkaline Phosphatase: 183 U/L — ABNORMAL HIGH (ref 40–150)
BUN: 12.4 mg/dL (ref 7.0–26.0)
CHLORIDE: 100 meq/L (ref 98–109)
CO2: 27 mEq/L (ref 22–29)
Calcium: 9.6 mg/dL (ref 8.4–10.4)
Creatinine: 0.7 mg/dL (ref 0.6–1.1)
EGFR: 87 mL/min/{1.73_m2} — AB (ref >90)
GLUCOSE: 134 mg/dL (ref 70–140)
POTASSIUM: 4.2 meq/L (ref 3.5–5.1)
SODIUM: 138 meq/L (ref 136–145)
Total Bilirubin: 0.29 mg/dL (ref 0.20–1.20)
Total Protein: 6.4 g/dL (ref 6.4–8.3)

## 2016-12-23 ENCOUNTER — Other Ambulatory Visit (HOSPITAL_BASED_OUTPATIENT_CLINIC_OR_DEPARTMENT_OTHER): Payer: Self-pay

## 2016-12-23 DIAGNOSIS — C3492 Malignant neoplasm of unspecified part of left bronchus or lung: Secondary | ICD-10-CM

## 2016-12-23 DIAGNOSIS — C3490 Malignant neoplasm of unspecified part of unspecified bronchus or lung: Secondary | ICD-10-CM

## 2016-12-23 LAB — CBC WITH DIFFERENTIAL/PLATELET
BASO%: 0.5 % (ref 0.0–2.0)
BASOS ABS: 0.1 10*3/uL (ref 0.0–0.1)
EOS%: 0.2 % (ref 0.0–7.0)
Eosinophils Absolute: 0 10*3/uL (ref 0.0–0.5)
HEMATOCRIT: 39.7 % (ref 34.8–46.6)
HGB: 12.9 g/dL (ref 11.6–15.9)
LYMPH%: 15.7 % (ref 14.0–49.7)
MCH: 31.3 pg (ref 25.1–34.0)
MCHC: 32.5 g/dL (ref 31.5–36.0)
MCV: 96.4 fL (ref 79.5–101.0)
MONO#: 1.1 10*3/uL — AB (ref 0.1–0.9)
MONO%: 9.6 % (ref 0.0–14.0)
NEUT#: 8.2 10*3/uL — ABNORMAL HIGH (ref 1.5–6.5)
NEUT%: 74 % (ref 38.4–76.8)
PLATELETS: 281 10*3/uL (ref 145–400)
RBC: 4.12 10*6/uL (ref 3.70–5.45)
RDW: 17.2 % — ABNORMAL HIGH (ref 11.2–14.5)
WBC: 11.1 10*3/uL — ABNORMAL HIGH (ref 3.9–10.3)
lymph#: 1.8 10*3/uL (ref 0.9–3.3)

## 2016-12-23 LAB — COMPREHENSIVE METABOLIC PANEL
ALT: 8 U/L (ref 0–55)
ANION GAP: 14 meq/L — AB (ref 3–11)
AST: 12 U/L (ref 5–34)
Albumin: 3.1 g/dL — ABNORMAL LOW (ref 3.5–5.0)
Alkaline Phosphatase: 130 U/L (ref 40–150)
BUN: 6 mg/dL — ABNORMAL LOW (ref 7.0–26.0)
CALCIUM: 9.9 mg/dL (ref 8.4–10.4)
CHLORIDE: 104 meq/L (ref 98–109)
CO2: 23 meq/L (ref 22–29)
CREATININE: 0.7 mg/dL (ref 0.6–1.1)
Glucose: 100 mg/dl (ref 70–140)
POTASSIUM: 4 meq/L (ref 3.5–5.1)
Sodium: 141 mEq/L (ref 136–145)
Total Bilirubin: 0.27 mg/dL (ref 0.20–1.20)
Total Protein: 7.1 g/dL (ref 6.4–8.3)

## 2016-12-23 LAB — UA PROTEIN, DIPSTICK - CHCC

## 2016-12-29 ENCOUNTER — Other Ambulatory Visit: Payer: Self-pay | Admitting: Medical Oncology

## 2016-12-29 DIAGNOSIS — C3492 Malignant neoplasm of unspecified part of left bronchus or lung: Secondary | ICD-10-CM

## 2016-12-30 ENCOUNTER — Encounter: Payer: Self-pay | Admitting: Pharmacy Technician

## 2016-12-30 ENCOUNTER — Other Ambulatory Visit (HOSPITAL_BASED_OUTPATIENT_CLINIC_OR_DEPARTMENT_OTHER): Payer: Medicare Other

## 2016-12-30 ENCOUNTER — Telehealth: Payer: Self-pay | Admitting: Internal Medicine

## 2016-12-30 ENCOUNTER — Ambulatory Visit (HOSPITAL_BASED_OUTPATIENT_CLINIC_OR_DEPARTMENT_OTHER): Payer: Medicare Other | Admitting: Internal Medicine

## 2016-12-30 ENCOUNTER — Ambulatory Visit (HOSPITAL_BASED_OUTPATIENT_CLINIC_OR_DEPARTMENT_OTHER): Payer: Medicare Other

## 2016-12-30 ENCOUNTER — Encounter: Payer: Self-pay | Admitting: Internal Medicine

## 2016-12-30 DIAGNOSIS — Z5111 Encounter for antineoplastic chemotherapy: Secondary | ICD-10-CM

## 2016-12-30 DIAGNOSIS — F419 Anxiety disorder, unspecified: Secondary | ICD-10-CM | POA: Diagnosis not present

## 2016-12-30 DIAGNOSIS — R630 Anorexia: Secondary | ICD-10-CM

## 2016-12-30 DIAGNOSIS — C3490 Malignant neoplasm of unspecified part of unspecified bronchus or lung: Secondary | ICD-10-CM

## 2016-12-30 DIAGNOSIS — F411 Generalized anxiety disorder: Secondary | ICD-10-CM

## 2016-12-30 DIAGNOSIS — R5383 Other fatigue: Secondary | ICD-10-CM

## 2016-12-30 DIAGNOSIS — Z5112 Encounter for antineoplastic immunotherapy: Secondary | ICD-10-CM | POA: Diagnosis present

## 2016-12-30 DIAGNOSIS — C3492 Malignant neoplasm of unspecified part of left bronchus or lung: Secondary | ICD-10-CM

## 2016-12-30 DIAGNOSIS — I10 Essential (primary) hypertension: Secondary | ICD-10-CM

## 2016-12-30 DIAGNOSIS — Z95828 Presence of other vascular implants and grafts: Secondary | ICD-10-CM

## 2016-12-30 LAB — COMPREHENSIVE METABOLIC PANEL
ALBUMIN: 3.4 g/dL — AB (ref 3.5–5.0)
ALK PHOS: 108 U/L (ref 40–150)
AST: 10 U/L (ref 5–34)
Anion Gap: 16 mEq/L — ABNORMAL HIGH (ref 3–11)
BILIRUBIN TOTAL: 0.22 mg/dL (ref 0.20–1.20)
BUN: 6.3 mg/dL — AB (ref 7.0–26.0)
CALCIUM: 9.9 mg/dL (ref 8.4–10.4)
CO2: 21 mEq/L — ABNORMAL LOW (ref 22–29)
CREATININE: 0.7 mg/dL (ref 0.6–1.1)
Chloride: 104 mEq/L (ref 98–109)
EGFR: 86 mL/min/{1.73_m2} — ABNORMAL LOW (ref >90)
GLUCOSE: 185 mg/dL — AB (ref 70–140)
POTASSIUM: 4.2 meq/L (ref 3.5–5.1)
Sodium: 140 mEq/L (ref 136–145)
TOTAL PROTEIN: 7.3 g/dL (ref 6.4–8.3)

## 2016-12-30 LAB — CBC WITH DIFFERENTIAL/PLATELET
BASO%: 0.4 % (ref 0.0–2.0)
BASOS ABS: 0 10*3/uL (ref 0.0–0.1)
EOS%: 0 % (ref 0.0–7.0)
Eosinophils Absolute: 0 10*3/uL (ref 0.0–0.5)
HEMATOCRIT: 40.3 % (ref 34.8–46.6)
HEMOGLOBIN: 13.3 g/dL (ref 11.6–15.9)
LYMPH%: 7.5 % — ABNORMAL LOW (ref 14.0–49.7)
MCH: 31.6 pg (ref 25.1–34.0)
MCHC: 33 g/dL (ref 31.5–36.0)
MCV: 95.7 fL (ref 79.5–101.0)
MONO#: 0.2 10*3/uL (ref 0.1–0.9)
MONO%: 2.8 % (ref 0.0–14.0)
NEUT%: 89.3 % — ABNORMAL HIGH (ref 38.4–76.8)
NEUTROS ABS: 6.4 10*3/uL (ref 1.5–6.5)
Platelets: 446 10*3/uL — ABNORMAL HIGH (ref 145–400)
RBC: 4.21 10*6/uL (ref 3.70–5.45)
RDW: 17.9 % — AB (ref 11.2–14.5)
WBC: 7.1 10*3/uL (ref 3.9–10.3)
lymph#: 0.5 10*3/uL — ABNORMAL LOW (ref 0.9–3.3)

## 2016-12-30 MED ORDER — SODIUM CHLORIDE 0.9 % IV SOLN
Freq: Once | INTRAVENOUS | Status: AC
  Administered 2016-12-30: 11:00:00 via INTRAVENOUS

## 2016-12-30 MED ORDER — DEXAMETHASONE SODIUM PHOSPHATE 10 MG/ML IJ SOLN
INTRAMUSCULAR | Status: AC
  Filled 2016-12-30: qty 1

## 2016-12-30 MED ORDER — ACETAMINOPHEN 325 MG PO TABS
ORAL_TABLET | ORAL | Status: AC
  Filled 2016-12-30: qty 2

## 2016-12-30 MED ORDER — SODIUM CHLORIDE 0.9 % IV SOLN
10.0000 mg/kg | Freq: Once | INTRAVENOUS | Status: AC
  Administered 2016-12-30: 800 mg via INTRAVENOUS
  Filled 2016-12-30: qty 50

## 2016-12-30 MED ORDER — HEPARIN SOD (PORK) LOCK FLUSH 100 UNIT/ML IV SOLN
500.0000 [IU] | Freq: Once | INTRAVENOUS | Status: AC | PRN
  Administered 2016-12-30: 500 [IU]
  Filled 2016-12-30: qty 5

## 2016-12-30 MED ORDER — PEGFILGRASTIM 6 MG/0.6ML ~~LOC~~ PSKT
6.0000 mg | PREFILLED_SYRINGE | Freq: Once | SUBCUTANEOUS | Status: AC
  Administered 2016-12-30: 6 mg via SUBCUTANEOUS
  Filled 2016-12-30: qty 0.6

## 2016-12-30 MED ORDER — DEXAMETHASONE SODIUM PHOSPHATE 10 MG/ML IJ SOLN
10.0000 mg | Freq: Once | INTRAMUSCULAR | Status: AC
  Administered 2016-12-30: 10 mg via INTRAVENOUS

## 2016-12-30 MED ORDER — ALPRAZOLAM 0.25 MG PO TABS: 0.2500 mg | ORAL_TABLET | Freq: Two times a day (BID) | ORAL | 0 refills | Status: DC | PRN

## 2016-12-30 MED ORDER — ACETAMINOPHEN 325 MG PO TABS
650.0000 mg | ORAL_TABLET | Freq: Once | ORAL | Status: AC
  Administered 2016-12-30: 650 mg via ORAL

## 2016-12-30 MED ORDER — SODIUM CHLORIDE 0.9 % IV SOLN: 10.0000 mg/kg | Freq: Once | INTRAVENOUS | Status: DC

## 2016-12-30 MED ORDER — SODIUM CHLORIDE 0.9% FLUSH
10.0000 mL | INTRAVENOUS | Status: DC | PRN
  Administered 2016-12-30: 10 mL
  Filled 2016-12-30: qty 10

## 2016-12-30 MED ORDER — DIPHENHYDRAMINE HCL 50 MG/ML IJ SOLN
INTRAMUSCULAR | Status: AC
  Filled 2016-12-30: qty 1

## 2016-12-30 MED ORDER — DIPHENHYDRAMINE HCL 50 MG/ML IJ SOLN
50.0000 mg | Freq: Once | INTRAMUSCULAR | Status: AC
  Administered 2016-12-30: 50 mg via INTRAVENOUS

## 2016-12-30 MED ORDER — DOCETAXEL CHEMO INJECTION 160 MG/16ML
65.0000 mg/m2 | Freq: Once | INTRAVENOUS | Status: AC
  Administered 2016-12-30: 130 mg via INTRAVENOUS
  Filled 2016-12-30: qty 13

## 2016-12-30 MED ORDER — DIPHENHYDRAMINE HCL 25 MG PO CAPS
ORAL_CAPSULE | ORAL | Status: AC
  Filled 2016-12-30: qty 2

## 2016-12-30 NOTE — Progress Notes (Signed)
The patient is on Medicare and Drug Assistance is closed. If future assistance is need, contact the pharmacy.

## 2016-12-30 NOTE — Progress Notes (Signed)
[facility] [phone]   [phone]  OFFICE PROGRESS NOTE  Scot Jun, FNP [place] [address]  DIAGNOSIS: Stage IV (T2a, N0, M1a) non-small cell lung cancer, adenocarcinoma presented with bilateral pulmonary lesions. This could be also consider as synchronous primary tumors.  PRIOR THERAPY:  1) Systemic chemotherapy with carboplatin for AUC of 5 and Alimta 500 MG/M2 every 3 weeks. Status post 6 cycles. 2) Treatment according to the BMS checkmate 370 clinical trial. Randomized to group A arm B1 treatment with pemetrexed (Alimta) 500 mg/m given every 3 weeks. Status post 6 cycles discontinued secondary to disease progression. 3) Tecentriq 1200 mg IV every 3 weeks status post 6 cycles discontinued secondary to significant skin rash. 4) palliative radiotherapy to the left upper lobe lung mass under the care of Dr. Lisbeth Renshaw completed on 11/16/2015.  CURRENT THERAPY: Systemic chemotherapy with docetaxel 75 MG/M2 and Cyramza 10 MG/KG every 3 weeks with Neulasta support. First dose 08/25/2016. Status post 6 cycles. Starting from cycle #6, I would reduce the dose of docetaxel to 65 MG/M2 secondary to intolerance.  INTERVAL HISTORY: Heather Banks 66 y.o. female returns to the clinic today for follow-up visit. The patient is feeling fine with no specific complaints except for fatigue and nail changes. She is also more anxious and worried about her condition. She tolerated the last cycle of her treatment well except for fatigue and lack of appetite several days after the treatment. She lost around 6 pounds since her last visit. She also has intermittent nausea and she is currently on Compazine. She has no chest pain, shortness breath, cough or hemoptysis. She denied having any fever or chills. She denied having any diarrhea or constipation. She has intermittent tightness under her breasts. She is requesting refill of Xanax. She was also  complaining of some sinus drainage with yellow secretions and she is currently on Sudafed. The patient is here today for evaluation before starting cycle #7 of her treatment.    MEDICAL HISTORY: Past Medical History:  Diagnosis Date  . Adenocarcinoma of left lung, stage 4 ([facility]) 07/09/2014  . Compression fracture of thoracic vertebra (HCC) 04/21/2016  . COPD (chronic obstructive pulmonary disease) (HCC)    Albuterol neb as needed;Pulmicort neb daily  . Encounter for antineoplastic chemotherapy 01/21/2015  . Full code status 05/01/2015  . GERD (gastroesophageal reflux disease)    takes Pantoprazole and Zantac daily  . History of bronchitis    >5 yrs ago  . History of migraine    last one 2 wks ago  . Hx of radiation therapy 10/29/2015 to 11/16/2015   The Left upper lobe was treated to 37.5 Gy in 15 fractions at 2.5 Gy per fraction  . Lung mass   . Panic attacks    but doesn't take any meds  . Pneumonia    hx of-last time about 4+yrs ago    ALLERGIES:  is allergic to ativan [lorazepam] and cinnamon.  MEDICATIONS:  Current Outpatient Prescriptions  Medication Sig Dispense Refill  . acidophilus (RISAQUAD) CAPS capsule Take 1 capsule by mouth daily.    Marland Kitchen albuterol (PROVENTIL) (2.5 MG/3ML) 0.083% nebulizer solution Take 3 mLs (2.5 mg total) by nebulization every 6 (six) hours as needed for wheezing or shortness of breath. 75 mL 3  . ALPRAZolam (XANAX) 0.25 MG tablet Take 1 tablet (0.25 mg total) by mouth 2 (two) times daily as needed for anxiety. 40 tablet 0  . amLODipine (NORVASC)  5 MG tablet Take 1 tablet (5 mg total) by mouth daily. 90 tablet 1  . aspirin-acetaminophen-caffeine (EXCEDRIN MIGRAINE) 353-299-24 MG per tablet Take 2 tablets by mouth every 6 (six) hours as needed for headache.    . calcium carbonate (OS-CAL - DOSED IN MG OF ELEMENTAL CALCIUM) 1250 (500 Ca) MG tablet Take 1 tablet by mouth daily with breakfast.    . cholecalciferol (VITAMIN D) 1000 units tablet Take 1,000  Units by mouth daily.    . cyanocobalamin 2000 MCG tablet Take 2,000 mcg by mouth daily.    Marland Kitchen dexamethasone (DECADRON) 4 MG tablet 2 tablets by mouth twice a day the day before, day of and day after the chemotherapy every 3 weeks. 40 tablet 1  . diphenhydrAMINE (BENADRYL) 25 mg capsule Take 25 mg by mouth every 6 (six) hours as needed for itching. Reported on 01/19/2016    . famotidine (PEPCID) 20 MG tablet Take 1 tablet (20 mg total) by mouth daily. 15 tablet 0  . hydroxypropyl methylcellulose / hypromellose (ISOPTO TEARS / GONIOVISC) 2.5 % ophthalmic solution 1 drop.    . Lidocaine (ASPERCREME LIDOCAINE) 4 % PTCH Apply 1 patch topically daily as needed. Place on back as needed for pain    . lidocaine-prilocaine (EMLA) cream Apply 1 application topically as needed. Apply 1 tsp over port site 1-1.5 hours prior to chemotherapy. 30 g 0  . loratadine (CLARITIN) 10 MG tablet Take 1 tablet (10 mg total) by mouth daily. 15 tablet 0  . mirtazapine (REMERON) 30 MG tablet Take 0.5 tablets (15 mg total) by mouth at bedtime as needed (for sleep). (Patient not taking: Reported on 12/09/2016) 30 tablet 2  . ondansetron (ZOFRAN) 8 MG tablet Take 1 tablet (8 mg total) by mouth every 8 (eight) hours as needed for nausea or vomiting. 20 tablet 0  . prochlorperazine (COMPAZINE) 10 MG tablet Take 1 tablet (10 mg total) by mouth every 6 (six) hours as needed for nausea or vomiting. 60 tablet 0  . Tiotropium Bromide Monohydrate (SPIRIVA RESPIMAT) 2.5 MCG/ACT AERS Inhale 2 puffs into the lungs daily. 1 Inhaler 0  . umeclidinium-vilanterol (ANORO ELLIPTA) 62.5-25 MCG/INH AEPB Inhale 1 puff into the lungs daily. 30 each 5  . VENTOLIN HFA 108 (90 Base) MCG/ACT inhaler INHALE 2 PUFFS BY MOUTH EVERY 6 HOURS AS NEEDED FOR WHEEZE OR SHORTNESS OF BREATH 18 g 5   No current facility-administered medications for this visit.    Facility-Administered Medications Ordered in Other Visits  Medication Dose Route Frequency Provider Last  Rate Last Dose  . heparin lock flush 100 unit/mL  500 Units Intracatheter Once PRN Curt Bears, MD      . sodium chloride 0.9 % injection 10 mL  10 mL Intracatheter PRN Curt Bears, MD   10 mL at 07/31/15 1635  . sodium chloride 0.9 % injection 10 mL  10 mL Intracatheter PRN Curt Bears, MD        SURGICAL HISTORY:  Past Surgical History:  Procedure Laterality Date  . ABDOMINAL HYSTERECTOMY    . APPENDECTOMY    . IR GENERIC HISTORICAL  05/02/2016   IR RADIOLOGIST EVAL & MGMT 05/02/2016 [facility]  . IR GENERIC HISTORICAL  05/08/2016   IR VERTEBROPLASTY EA ADDL (T&LS) BX INC UNI/BIL INC INJECT/IMAGING 05/08/2016 Luanne Bras, MD [facility]  . IR GENERIC HISTORICAL  05/08/2016   IR VERTEBROPLASTY CERV/THOR BX INC UNI/BIL INC/INJECT/IMAGING 05/08/2016 Luanne Bras, MD [facility]  . IR GENERIC HISTORICAL  05/29/2016  IR RADIOLOGIST EVAL & MGMT 05/29/2016 [facility]  . OOPHORECTOMY    . PORTACATH PLACEMENT  jan. 2016  . TONSILLECTOMY    . VIDEO BRONCHOSCOPY WITH ENDOBRONCHIAL NAVIGATION N/A 08/14/2014   Procedure: VIDEO BRONCHOSCOPY WITH ENDOBRONCHIAL NAVIGATION;  Surgeon: Rigoberto Noel, MD;  Location: [facility];  Service: Thoracic;  Laterality: N/A;    REVIEW OF SYSTEMS:  Constitutional: positive for fatigue and weight loss Eyes: negative Ears, nose, mouth, throat, and face: negative Respiratory: positive for pleurisy/chest pain Cardiovascular: negative Gastrointestinal: negative Genitourinary:negative Integument/breast: negative Hematologic/lymphatic: negative Musculoskeletal:negative Neurological: negative Behavioral/Psych: positive for anxiety Endocrine: negative Allergic/Immunologic: negative   PHYSICAL EXAMINATION: General appearance: alert, cooperative, fatigued and no distress Head: Normocephalic, without obvious abnormality, atraumatic Neck: no adenopathy, no JVD, supple, symmetrical, trachea midline and thyroid not enlarged, symmetric, no  tenderness/mass/nodules Lymph nodes: Cervical, supraclavicular, and axillary nodes normal. Resp: clear to auscultation bilaterally Back: symmetric, no curvature. ROM normal. No CVA tenderness. Cardio: regular rate and rhythm, S1, S2 normal, no murmur, click, rub or gallop GI: soft, non-tender; bowel sounds normal; no masses,  no organomegaly Extremities: extremities normal, atraumatic, no cyanosis or edema Neurologic: Alert and oriented X 3, normal strength and tone. Normal symmetric reflexes. Normal coordination and gait   ECOG PERFORMANCE STATUS: 1 - Symptomatic but completely ambulatory  Blood pressure (!) 151/97, pulse 65, temperature 98.3 F (36.8 C), temperature source Oral, resp. rate 18, height 5\' 5"  (1.651 m), weight 176 lb 6.4 oz (80 kg), SpO2 97 %.  LABORATORY DATA: Lab Results  Component Value Date   WBC 7.1 12/30/2016   HGB 13.3 12/30/2016   HCT 40.3 12/30/2016   MCV 95.7 12/30/2016   PLT 446 (H) 12/30/2016      Chemistry      Component Value Date/Time   NA 140 12/30/2016 0909   K 4.2 12/30/2016 0909   CL 98 (L) 05/21/2016 0325   CO2 21 (L) 12/30/2016 0909   BUN 6.3 (L) 12/30/2016 0909   CREATININE 0.7 12/30/2016 0909      Component Value Date/Time   CALCIUM 9.9 12/30/2016 0909   ALKPHOS 108 12/30/2016 0909   AST 10 12/30/2016 0909   ALT <6 12/30/2016 0909   BILITOT 0.22 12/30/2016 0909       RADIOGRAPHIC STUDIES: No results found.  ASSESSMENT AND PLAN:  This is a very pleasant 66 years old white female with stage IV non-small cell lung cancer, adenocarcinoma status post several regimens of systemic chemotherapy as well as immunotherapy. She is currently undergoing treatment with reduced dose docetaxel 65/M2 and Cyramza 10 MG/KG every 3 weeks is status post 6 cycles and has been tolerating the treatment well except for the fatigue and nail changes. I recommended for the patient to proceed with cycle #7 today as a scheduled. I will see her back for  follow-up visit in 3 weeks for evaluation after repeating CT scan of the chest, abdomen and pelvis for restaging of her disease. For anxiety, I gave the patient a refill of Xanax. For hypertension, she will continue with her current blood pressure medication. For the lack of appetite and fatigue, the patient will extend her treatment with Decadron for few more days if needed. She was advised to call immediately if she has any concerning symptoms in the interval. The patient voices understanding of current disease status and treatment options and is in agreement with the current care plan. All questions were answered. The patient knows to call the clinic with any problems, questions or  concerns. We can certainly see the patient much sooner if necessary. Disclaimer: This note was dictated with voice recognition software. Similar sounding words can inadvertently be transcribed and may not be corrected upon review.

## 2016-12-30 NOTE — Patient Instructions (Signed)
[facility] Discharge Instructions for Patients Receiving Chemotherapy  Today you received the following chemotherapy agents: Taxotere and Cyramza   To help prevent nausea and vomiting after your treatment, we encourage you to take your nausea medication as directed.    If you develop nausea and vomiting that is not controlled by your nausea medication, call the clinic.   BELOW ARE SYMPTOMS THAT SHOULD BE REPORTED IMMEDIATELY:  *FEVER GREATER THAN 100.5 F  *CHILLS WITH OR WITHOUT FEVER  NAUSEA AND VOMITING THAT IS NOT CONTROLLED WITH YOUR NAUSEA MEDICATION  *UNUSUAL SHORTNESS OF BREATH  *UNUSUAL BRUISING OR BLEEDING  TENDERNESS IN MOUTH AND THROAT WITH OR WITHOUT PRESENCE OF ULCERS  *URINARY PROBLEMS  *BOWEL PROBLEMS  UNUSUAL RASH Items with * indicate a potential emergency and should be followed up as soon as possible.  Feel free to call the clinic you have any questions or concerns. The clinic phone number is [phone].  Please show the [facility] at check-in to the Emergency Department and triage nurse.

## 2016-12-30 NOTE — Telephone Encounter (Signed)
Weekly labs during Chemo was scheduled, per 12/30/16 los. Follow up visit and Chemo every 3 weeks was scheduled , per 12/30/16 los. Patient was paged to Infusion. Per patient, will get copy of schedule and AVS report from Infusion nurse.

## 2017-01-06 ENCOUNTER — Other Ambulatory Visit (HOSPITAL_BASED_OUTPATIENT_CLINIC_OR_DEPARTMENT_OTHER): Payer: Medicare Other

## 2017-01-06 ENCOUNTER — Other Ambulatory Visit: Payer: Self-pay | Admitting: Medical Oncology

## 2017-01-06 DIAGNOSIS — C3492 Malignant neoplasm of unspecified part of left bronchus or lung: Secondary | ICD-10-CM

## 2017-01-06 DIAGNOSIS — C3490 Malignant neoplasm of unspecified part of unspecified bronchus or lung: Secondary | ICD-10-CM

## 2017-01-06 LAB — CBC WITH DIFFERENTIAL/PLATELET
BASO%: 1.1 % (ref 0.0–2.0)
BASOS ABS: 0.3 10*3/uL — AB (ref 0.0–0.1)
EOS ABS: 0.1 10*3/uL (ref 0.0–0.5)
EOS%: 0.2 % (ref 0.0–7.0)
HCT: 40.4 % (ref 34.8–46.6)
HGB: 13.2 g/dL (ref 11.6–15.9)
LYMPH%: 8 % — ABNORMAL LOW (ref 14.0–49.7)
MCH: 31.6 pg (ref 25.1–34.0)
MCHC: 32.7 g/dL (ref 31.5–36.0)
MCV: 96.7 fL (ref 79.5–101.0)
MONO#: 3.1 10*3/uL — ABNORMAL HIGH (ref 0.1–0.9)
MONO%: 10.4 % (ref 0.0–14.0)
NEUT#: 23.7 10*3/uL — ABNORMAL HIGH (ref 1.5–6.5)
NEUT%: 80.3 % — AB (ref 38.4–76.8)
NRBC: 1 % — AB (ref 0–0)
PLATELETS: 248 10*3/uL (ref 145–400)
RBC: 4.18 10*6/uL (ref 3.70–5.45)
RDW: 17.1 % — AB (ref 11.2–14.5)
WBC: 29.5 10*3/uL — ABNORMAL HIGH (ref 3.9–10.3)
lymph#: 2.4 10*3/uL (ref 0.9–3.3)

## 2017-01-06 LAB — COMPREHENSIVE METABOLIC PANEL
ALBUMIN: 3.2 g/dL — AB (ref 3.5–5.0)
ALK PHOS: 195 U/L — AB (ref 40–150)
ALT: 12 U/L (ref 0–55)
ANION GAP: 11 meq/L (ref 3–11)
AST: 15 U/L (ref 5–34)
BUN: 5 mg/dL — ABNORMAL LOW (ref 7.0–26.0)
CO2: 26 meq/L (ref 22–29)
Calcium: 9.9 mg/dL (ref 8.4–10.4)
Chloride: 100 mEq/L (ref 98–109)
Creatinine: 0.7 mg/dL (ref 0.6–1.1)
Glucose: 109 mg/dl (ref 70–140)
POTASSIUM: 3.5 meq/L (ref 3.5–5.1)
Sodium: 138 mEq/L (ref 136–145)
TOTAL PROTEIN: 6.5 g/dL (ref 6.4–8.3)
Total Bilirubin: <0.22 mg/dL (ref 0.20–1.20)

## 2017-01-13 ENCOUNTER — Other Ambulatory Visit (HOSPITAL_BASED_OUTPATIENT_CLINIC_OR_DEPARTMENT_OTHER): Payer: Medicare Other

## 2017-01-13 DIAGNOSIS — S22000A Wedge compression fracture of unspecified thoracic vertebra, initial encounter for closed fracture: Secondary | ICD-10-CM

## 2017-01-13 DIAGNOSIS — C3412 Malignant neoplasm of upper lobe, left bronchus or lung: Secondary | ICD-10-CM

## 2017-01-13 DIAGNOSIS — C3490 Malignant neoplasm of unspecified part of unspecified bronchus or lung: Secondary | ICD-10-CM | POA: Diagnosis present

## 2017-01-13 DIAGNOSIS — C3492 Malignant neoplasm of unspecified part of left bronchus or lung: Secondary | ICD-10-CM

## 2017-01-13 LAB — COMPREHENSIVE METABOLIC PANEL
ALT: 6 U/L (ref 0–55)
ANION GAP: 12 meq/L — AB (ref 3–11)
AST: 10 U/L (ref 5–34)
Albumin: 2.8 g/dL — ABNORMAL LOW (ref 3.5–5.0)
Alkaline Phosphatase: 129 U/L (ref 40–150)
BILIRUBIN TOTAL: 0.24 mg/dL (ref 0.20–1.20)
BUN: 7.3 mg/dL (ref 7.0–26.0)
CHLORIDE: 103 meq/L (ref 98–109)
CO2: 25 meq/L (ref 22–29)
Calcium: 9.7 mg/dL (ref 8.4–10.4)
Creatinine: 0.7 mg/dL (ref 0.6–1.1)
GLUCOSE: 106 mg/dL (ref 70–140)
Potassium: 4 mEq/L (ref 3.5–5.1)
SODIUM: 140 meq/L (ref 136–145)
TOTAL PROTEIN: 6.9 g/dL (ref 6.4–8.3)

## 2017-01-13 LAB — UA PROTEIN, DIPSTICK - CHCC

## 2017-01-19 ENCOUNTER — Other Ambulatory Visit: Payer: Self-pay | Admitting: *Deleted

## 2017-01-19 DIAGNOSIS — C3412 Malignant neoplasm of upper lobe, left bronchus or lung: Secondary | ICD-10-CM

## 2017-01-20 ENCOUNTER — Ambulatory Visit: Payer: Medicare Other

## 2017-01-20 ENCOUNTER — Ambulatory Visit (HOSPITAL_BASED_OUTPATIENT_CLINIC_OR_DEPARTMENT_OTHER): Payer: Medicare Other | Admitting: Internal Medicine

## 2017-01-20 ENCOUNTER — Encounter: Payer: Self-pay | Admitting: Internal Medicine

## 2017-01-20 ENCOUNTER — Other Ambulatory Visit (HOSPITAL_BASED_OUTPATIENT_CLINIC_OR_DEPARTMENT_OTHER): Payer: Medicare Other

## 2017-01-20 ENCOUNTER — Ambulatory Visit (HOSPITAL_BASED_OUTPATIENT_CLINIC_OR_DEPARTMENT_OTHER): Payer: Medicare Other

## 2017-01-20 VITALS — BP 122/85 | HR 90 | Resp 16

## 2017-01-20 DIAGNOSIS — C3490 Malignant neoplasm of unspecified part of unspecified bronchus or lung: Secondary | ICD-10-CM

## 2017-01-20 DIAGNOSIS — Z5112 Encounter for antineoplastic immunotherapy: Secondary | ICD-10-CM

## 2017-01-20 DIAGNOSIS — Z95828 Presence of other vascular implants and grafts: Secondary | ICD-10-CM

## 2017-01-20 DIAGNOSIS — C3492 Malignant neoplasm of unspecified part of left bronchus or lung: Secondary | ICD-10-CM

## 2017-01-20 DIAGNOSIS — C3412 Malignant neoplasm of upper lobe, left bronchus or lung: Secondary | ICD-10-CM

## 2017-01-20 DIAGNOSIS — Z5111 Encounter for antineoplastic chemotherapy: Secondary | ICD-10-CM

## 2017-01-20 DIAGNOSIS — F411 Generalized anxiety disorder: Secondary | ICD-10-CM

## 2017-01-20 LAB — CBC WITH DIFFERENTIAL/PLATELET
BASO%: 0.5 % (ref 0.0–2.0)
Basophils Absolute: 0 10*3/uL (ref 0.0–0.1)
EOS ABS: 0 10*3/uL (ref 0.0–0.5)
EOS%: 0 % (ref 0.0–7.0)
HCT: 39.1 % (ref 34.8–46.6)
HEMOGLOBIN: 13 g/dL (ref 11.6–15.9)
LYMPH%: 7.3 % — ABNORMAL LOW (ref 14.0–49.7)
MCH: 31.7 pg (ref 25.1–34.0)
MCHC: 33.3 g/dL (ref 31.5–36.0)
MCV: 95.3 fL (ref 79.5–101.0)
MONO#: 0.2 10*3/uL (ref 0.1–0.9)
MONO%: 3.6 % (ref 0.0–14.0)
NEUT%: 88.6 % — ABNORMAL HIGH (ref 38.4–76.8)
NEUTROS ABS: 6 10*3/uL (ref 1.5–6.5)
Platelets: 472 10*3/uL — ABNORMAL HIGH (ref 145–400)
RBC: 4.11 10*6/uL (ref 3.70–5.45)
RDW: 17.2 % — AB (ref 11.2–14.5)
WBC: 6.7 10*3/uL (ref 3.9–10.3)
lymph#: 0.5 10*3/uL — ABNORMAL LOW (ref 0.9–3.3)

## 2017-01-20 LAB — COMPREHENSIVE METABOLIC PANEL
ALBUMIN: 3.1 g/dL — AB (ref 3.5–5.0)
ALK PHOS: 118 U/L (ref 40–150)
ALT: 6 U/L (ref 0–55)
AST: 10 U/L (ref 5–34)
Anion Gap: 14 mEq/L — ABNORMAL HIGH (ref 3–11)
BILIRUBIN TOTAL: 0.22 mg/dL (ref 0.20–1.20)
BUN: 8.5 mg/dL (ref 7.0–26.0)
CO2: 25 mEq/L (ref 22–29)
Calcium: 12.4 mg/dL — ABNORMAL HIGH (ref 8.4–10.4)
Chloride: 102 mEq/L (ref 98–109)
Creatinine: 0.8 mg/dL (ref 0.6–1.1)
EGFR: 83 mL/min/{1.73_m2} — ABNORMAL LOW (ref >90)
GLUCOSE: 135 mg/dL (ref 70–140)
Potassium: 4.6 mEq/L (ref 3.5–5.1)
SODIUM: 141 meq/L (ref 136–145)
TOTAL PROTEIN: 7.3 g/dL (ref 6.4–8.3)

## 2017-01-20 MED ORDER — ACETAMINOPHEN 325 MG PO TABS
650.0000 mg | ORAL_TABLET | Freq: Once | ORAL | Status: AC
  Administered 2017-01-20: 650 mg via ORAL

## 2017-01-20 MED ORDER — ALPRAZOLAM 0.25 MG PO TABS: 0.2500 mg | ORAL_TABLET | Freq: Two times a day (BID) | ORAL | 0 refills | Status: DC | PRN

## 2017-01-20 MED ORDER — SODIUM CHLORIDE 0.9 % IV SOLN
Freq: Once | INTRAVENOUS | Status: AC
  Administered 2017-01-20: 11:00:00 via INTRAVENOUS

## 2017-01-20 MED ORDER — PROCHLORPERAZINE MALEATE 10 MG PO TABS: 10.0000 mg | ORAL_TABLET | Freq: Four times a day (QID) | ORAL | 0 refills | Status: DC | PRN

## 2017-01-20 MED ORDER — SODIUM CHLORIDE 0.9 % IV SOLN
800.0000 mg | Freq: Once | INTRAVENOUS | Status: AC
  Administered 2017-01-20: 800 mg via INTRAVENOUS
  Filled 2017-01-20: qty 50

## 2017-01-20 MED ORDER — ACETAMINOPHEN 325 MG PO TABS
ORAL_TABLET | ORAL | Status: AC
  Filled 2017-01-20: qty 2

## 2017-01-20 MED ORDER — DEXAMETHASONE SODIUM PHOSPHATE 10 MG/ML IJ SOLN
INTRAMUSCULAR | Status: AC
  Filled 2017-01-20: qty 1

## 2017-01-20 MED ORDER — DEXAMETHASONE 4 MG PO TABS: ORAL_TABLET | ORAL | 1 refills | Status: AC

## 2017-01-20 MED ORDER — DOCETAXEL CHEMO INJECTION 160 MG/16ML
65.0000 mg/m2 | Freq: Once | INTRAVENOUS | Status: AC
  Administered 2017-01-20: 130 mg via INTRAVENOUS
  Filled 2017-01-20: qty 13

## 2017-01-20 MED ORDER — PEGFILGRASTIM 6 MG/0.6ML ~~LOC~~ PSKT
6.0000 mg | PREFILLED_SYRINGE | Freq: Once | SUBCUTANEOUS | Status: AC
  Administered 2017-01-20: 6 mg via SUBCUTANEOUS
  Filled 2017-01-20: qty 0.6

## 2017-01-20 MED ORDER — DIPHENHYDRAMINE HCL 50 MG/ML IJ SOLN
50.0000 mg | Freq: Once | INTRAMUSCULAR | Status: AC
  Administered 2017-01-20: 50 mg via INTRAVENOUS

## 2017-01-20 MED ORDER — DIPHENHYDRAMINE HCL 25 MG PO CAPS
ORAL_CAPSULE | ORAL | Status: AC
  Filled 2017-01-20: qty 2

## 2017-01-20 MED ORDER — DEXAMETHASONE SODIUM PHOSPHATE 10 MG/ML IJ SOLN
10.0000 mg | Freq: Once | INTRAMUSCULAR | Status: AC
  Administered 2017-01-20: 10 mg via INTRAVENOUS

## 2017-01-20 MED ORDER — HEPARIN SOD (PORK) LOCK FLUSH 100 UNIT/ML IV SOLN
500.0000 [IU] | Freq: Once | INTRAVENOUS | Status: AC | PRN
  Administered 2017-01-20: 500 [IU]
  Filled 2017-01-20: qty 5

## 2017-01-20 MED ORDER — SODIUM CHLORIDE 0.9% FLUSH
10.0000 mL | INTRAVENOUS | Status: DC | PRN
  Administered 2017-01-20: 10 mL
  Filled 2017-01-20: qty 10

## 2017-01-20 MED ORDER — DIPHENHYDRAMINE HCL 50 MG/ML IJ SOLN
INTRAMUSCULAR | Status: AC
  Filled 2017-01-20: qty 1

## 2017-01-20 NOTE — Progress Notes (Signed)
Nutrition Follow-up:  Nutrition follow-up completed this am during infusion.  Patient with stage IV non-small cell lung cancer.  Patient followed by Dr. Mckinley Jewel  Patient reports feels that appetite is a little better although still loosing weight.  Reports that she tries to eat small frequent meals.  Reports that she takes nausea medication to help relieve symptoms. Reports that she continues to drink protein shake 1-2 per day.  Reports she eats fruit, hamburger with ketchup, eggs, tuna salad, italian ice.  Reports that she took my advice and is eating more cold foods without a lot of smell which has seemed to help.    Medications: reviewed, Pt reports MD told her she could take more of the decadron if noted appetite was decreasing  Labs: reviewed  Anthropometrics:   Weight today 172 lb 14.4 oz decreased from 182 lb on 5/15  5% weight loss in 1 1/2 month.  Patient noted that UBW before starting treatment was 150-155 lb.  Reports weight gain with taking steriods  NUTRITION DIAGNOSIS: Inadequate oral intake continues   Malnutrition diagnosis: will continue to assess   INTERVENTION:   Encouraged patient to continue to eat good sources of calories and protein (adding peanut butter to fruit or eating fruit with yogurt, adding protein source to tomato sandwiches, etc) Encouraged patient to continue oral nutrition supplements  MONITORING, EVALUATION, GOAL: Patient will consume adequate calories and protein to maintain lean muscle mass.   NEXT VISIT: Tuesday, Aug 8  Carolie Mcilrath B. Zenia Resides, [place], [place] Registered Dietitian [phone] (pager)

## 2017-01-20 NOTE — Patient Instructions (Signed)
[facility] Discharge Instructions for Patients Receiving Chemotherapy  Today you received the following chemotherapy agents Cyramza and Taxotere.  To help prevent nausea and vomiting after your treatment, we encourage you to take your nausea medication.   If you develop nausea and vomiting that is not controlled by your nausea medication, call the clinic.   BELOW ARE SYMPTOMS THAT SHOULD BE REPORTED IMMEDIATELY:  *FEVER GREATER THAN 100.5 F  *CHILLS WITH OR WITHOUT FEVER  NAUSEA AND VOMITING THAT IS NOT CONTROLLED WITH YOUR NAUSEA MEDICATION  *UNUSUAL SHORTNESS OF BREATH  *UNUSUAL BRUISING OR BLEEDING  TENDERNESS IN MOUTH AND THROAT WITH OR WITHOUT PRESENCE OF ULCERS  *URINARY PROBLEMS  *BOWEL PROBLEMS  UNUSUAL RASH Items with * indicate a potential emergency and should be followed up as soon as possible.  Feel free to call the clinic you have any questions or concerns. The clinic phone number is [phone].  Please show the [facility] at check-in to the Emergency Department and triage nurse.

## 2017-01-20 NOTE — Progress Notes (Signed)
[facility] [phone]   [phone]  OFFICE PROGRESS NOTE  Scot Jun, FNP [place] [address]  DIAGNOSIS: Stage IV (T2a, N0, M1a) non-small cell lung cancer, adenocarcinoma presented with bilateral pulmonary lesions. This could be also consider as synchronous primary tumors.  PRIOR THERAPY:  1) Systemic chemotherapy with carboplatin for AUC of 5 and Alimta 500 MG/M2 every 3 weeks. Status post 6 cycles. 2) Treatment according to the BMS checkmate 370 clinical trial. Randomized to group A arm B1 treatment with pemetrexed (Alimta) 500 mg/m given every 3 weeks. Status post 6 cycles discontinued secondary to disease progression. 3) Tecentriq 1200 mg IV every 3 weeks status post 6 cycles discontinued secondary to significant skin rash. 4) palliative radiotherapy to the left upper lobe lung mass under the care of Dr. Lisbeth Renshaw completed on 11/16/2015.  CURRENT THERAPY: Systemic chemotherapy with docetaxel 75 MG/M2 and Cyramza 10 MG/KG every 3 weeks with Neulasta support. First dose 08/25/2016. Status post 7 cycles. Starting from cycle #6, I would reduce the dose of docetaxel to 65 MG/M2 secondary to intolerance.  INTERVAL HISTORY: Heather Banks 66 y.o. female returns to the clinic today for follow-up visit. The patient continues to have mild fatigue. She tolerated the last cycle of her treatment well. She denied having any chest pain, shortness breath, cough or hemoptysis. She denied having any fever or chills. She has no nausea, vomiting, diarrhea or constipation. She was supposed to have repeat CT scan of the chest performed before her visit today but unfortunately she has not received any call to schedule her scan which was ordered 3 weeks ago. She is here today for evaluation before starting cycle #8.  MEDICAL HISTORY: Past Medical History:  Diagnosis Date  . Adenocarcinoma of left lung, stage 4 ([facility]) 07/09/2014  . Compression  fracture of thoracic vertebra ([facility]) 04/21/2016  . COPD (chronic obstructive pulmonary disease) ([facility])    Albuterol neb as needed;Pulmicort neb daily  . Encounter for antineoplastic chemotherapy 01/21/2015  . Full code status 05/01/2015  . GERD (gastroesophageal reflux disease)    takes Pantoprazole and Zantac daily  . History of bronchitis    >5 yrs ago  . History of migraine    last one 2 wks ago  . Hx of radiation therapy 10/29/2015 to 11/16/2015   The Left upper lobe was treated to 37.5 Gy in 15 fractions at 2.5 Gy per fraction  . Lung mass   . Panic attacks    but doesn't take any meds  . Pneumonia    hx of-last time about 4+yrs ago    ALLERGIES:  is allergic to ativan [lorazepam] and cinnamon.  MEDICATIONS:  Current Outpatient Prescriptions  Medication Sig Dispense Refill  . acidophilus (RISAQUAD) CAPS capsule Take 1 capsule by mouth daily.    Marland Kitchen albuterol (PROVENTIL) (2.5 MG/3ML) 0.083% nebulizer solution Take 3 mLs (2.5 mg total) by nebulization every 6 (six) hours as needed for wheezing or shortness of breath. 75 mL 3  . ALPRAZolam (XANAX) 0.25 MG tablet Take 1 tablet (0.25 mg total) by mouth 2 (two) times daily as needed for anxiety. 40 tablet 0  . amLODipine (NORVASC) 5 MG tablet Take 1 tablet (5 mg total) by mouth daily. 90 tablet 1  . aspirin-acetaminophen-caffeine (EXCEDRIN MIGRAINE) [phone] MG per tablet Take 2 tablets by mouth every 6 (six) hours as needed for headache.    . calcium carbonate (OS-CAL - DOSED IN MG OF ELEMENTAL  CALCIUM) 1250 (500 Ca) MG tablet Take 1 tablet by mouth daily with breakfast.    . cholecalciferol (VITAMIN D) 1000 units tablet Take 1,000 Units by mouth daily.    . cyanocobalamin 2000 MCG tablet Take 2,000 mcg by mouth daily.    Marland Kitchen dexamethasone (DECADRON) 4 MG tablet 2 tablets by mouth twice a day the day before, day of and day after the chemotherapy every 3 weeks. 40 tablet 1  . diphenhydrAMINE (BENADRYL) 25 mg capsule Take 25 mg by mouth every  6 (six) hours as needed for itching. Reported on 01/19/2016    . famotidine (PEPCID) 20 MG tablet Take 1 tablet (20 mg total) by mouth daily. 15 tablet 0  . hydroxypropyl methylcellulose / hypromellose (ISOPTO TEARS / GONIOVISC) 2.5 % ophthalmic solution 1 drop.    . Lidocaine (ASPERCREME LIDOCAINE) 4 % PTCH Apply 1 patch topically daily as needed. Place on back as needed for pain    . lidocaine-prilocaine (EMLA) cream Apply 1 application topically as needed. Apply 1 tsp over port site 1-1.5 hours prior to chemotherapy. 30 g 0  . loratadine (CLARITIN) 10 MG tablet Take 1 tablet (10 mg total) by mouth daily. 15 tablet 0  . mirtazapine (REMERON) 30 MG tablet Take 0.5 tablets (15 mg total) by mouth at bedtime as needed (for sleep). (Patient not taking: Reported on 12/09/2016) 30 tablet 2  . ondansetron (ZOFRAN) 8 MG tablet Take 1 tablet (8 mg total) by mouth every 8 (eight) hours as needed for nausea or vomiting. 20 tablet 0  . prochlorperazine (COMPAZINE) 10 MG tablet Take 1 tablet (10 mg total) by mouth every 6 (six) hours as needed for nausea or vomiting. 60 tablet 0  . Tiotropium Bromide Monohydrate (SPIRIVA RESPIMAT) 2.5 MCG/ACT AERS Inhale 2 puffs into the lungs daily. 1 Inhaler 0  . umeclidinium-vilanterol (ANORO ELLIPTA) 62.5-25 MCG/INH AEPB Inhale 1 puff into the lungs daily. 30 each 5  . VENTOLIN HFA 108 (90 Base) MCG/ACT inhaler INHALE 2 PUFFS BY MOUTH EVERY 6 HOURS AS NEEDED FOR WHEEZE OR SHORTNESS OF BREATH 18 g 5   No current facility-administered medications for this visit.    Facility-Administered Medications Ordered in Other Visits  Medication Dose Route Frequency Provider Last Rate Last Dose  . heparin lock flush 100 unit/mL  500 Units Intracatheter Once PRN Curt Bears, MD      . sodium chloride 0.9 % injection 10 mL  10 mL Intracatheter PRN Curt Bears, MD   10 mL at 07/31/15 1635  . sodium chloride 0.9 % injection 10 mL  10 mL Intracatheter PRN Curt Bears, MD         SURGICAL HISTORY:  Past Surgical History:  Procedure Laterality Date  . ABDOMINAL HYSTERECTOMY    . APPENDECTOMY    . IR GENERIC HISTORICAL  05/02/2016   IR RADIOLOGIST EVAL & MGMT 05/02/2016 [facility]  . IR GENERIC HISTORICAL  05/08/2016   IR VERTEBROPLASTY EA ADDL (T&LS) BX INC UNI/BIL INC INJECT/IMAGING 05/08/2016 Luanne Bras, MD [facility]  . IR GENERIC HISTORICAL  05/08/2016   IR VERTEBROPLASTY CERV/THOR BX INC UNI/BIL INC/INJECT/IMAGING 05/08/2016 Luanne Bras, MD [facility]  . IR GENERIC HISTORICAL  05/29/2016   IR RADIOLOGIST EVAL & MGMT 05/29/2016 [facility]  . OOPHORECTOMY    . PORTACATH PLACEMENT  jan. 2016  . TONSILLECTOMY    . VIDEO BRONCHOSCOPY WITH ENDOBRONCHIAL NAVIGATION N/A 08/14/2014   Procedure: VIDEO BRONCHOSCOPY WITH ENDOBRONCHIAL NAVIGATION;  Surgeon: Rigoberto Noel, MD;  Location:  [facility] OR;  Service: Thoracic;  Laterality: N/A;    REVIEW OF SYSTEMS:  A comprehensive review of systems was negative except for: Constitutional: positive for fatigue   PHYSICAL EXAMINATION: General appearance: alert, cooperative, fatigued and no distress Head: Normocephalic, without obvious abnormality, atraumatic Neck: no adenopathy, no JVD, supple, symmetrical, trachea midline and thyroid not enlarged, symmetric, no tenderness/mass/nodules Lymph nodes: Cervical, supraclavicular, and axillary nodes normal. Resp: clear to auscultation bilaterally Back: symmetric, no curvature. ROM normal. No CVA tenderness. Cardio: regular rate and rhythm, S1, S2 normal, no murmur, click, rub or gallop GI: soft, non-tender; bowel sounds normal; no masses,  no organomegaly Extremities: extremities normal, atraumatic, no cyanosis or edema   ECOG PERFORMANCE STATUS: 1 - Symptomatic but completely ambulatory  Blood pressure (!) 142/95, pulse (!) 125, temperature 98.2 F (36.8 C), temperature source Oral, resp. rate 18, height 5\' 5"  (1.651 m), weight 172 lb 14.4 oz (78.4 kg), SpO2  93 %.  LABORATORY DATA: Lab Results  Component Value Date   WBC 6.7 01/20/2017   HGB 13.0 01/20/2017   HCT 39.1 01/20/2017   MCV 95.3 01/20/2017   PLT 472 (H) 01/20/2017      Chemistry      Component Value Date/Time   NA 140 01/13/2017 0838   K 4.0 01/13/2017 0838   CL 98 (L) 05/21/2016 0325   CO2 25 01/13/2017 0838   BUN 7.3 01/13/2017 0838   CREATININE 0.7 01/13/2017 0838      Component Value Date/Time   CALCIUM 9.7 01/13/2017 0838   ALKPHOS 129 01/13/2017 0838   AST 10 01/13/2017 0838   ALT 6 01/13/2017 0838   BILITOT 0.24 01/13/2017 0838       RADIOGRAPHIC STUDIES: No results found.  ASSESSMENT AND PLAN:  This is a very pleasant 66 years old white female with a stage IV non-small cell lung cancer, adenocarcinoma status post several regimens of systemic chemotherapy as well as immunotherapy. Currently undergoing systemic chemotherapy with reduced dose docetaxel 65 MG/M2 and Cyramza 10 MG/KG every 3 weeks status post 7 cycles. She has been tolerating this treatment well except for fatigue. She was supposed to have repeat CT scan of the chest, abdomen and pelvis before this visit but unfortunately the radiology department never called her to scheduled the scan. I recommended for her to continue her treatment today as a scheduled. I will try to get her scan scheduled in the next few days. She was advised to call immediately if she has any concerning symptoms in the interval. I gave the patient refill for Xanax, Decadron and Compazine. She was advised to call immediately if she has any concerning symptoms in the interval. The patient voices understanding of current disease status and treatment options and is in agreement with the current care plan. All questions were answered. The patient knows to call the clinic with any problems, questions or concerns. We can certainly see the patient much sooner if necessary. Disclaimer: This note was dictated with voice recognition  software. Similar sounding words can inadvertently be transcribed and may not be corrected upon review.

## 2017-01-22 ENCOUNTER — Telehealth (HOSPITAL_COMMUNITY): Payer: Self-pay

## 2017-01-22 ENCOUNTER — Telehealth: Payer: Self-pay

## 2017-01-22 ENCOUNTER — Telehealth: Payer: Self-pay | Admitting: Pulmonary Disease

## 2017-01-22 NOTE — Telephone Encounter (Signed)
Pt left Dr Julien Nordmann a message. She has her CT scan scheduled for 7/13 at 930. She is calling to let Dr Julien Nordmann know b/c he asked her to.

## 2017-01-22 NOTE — Telephone Encounter (Signed)
Noted  

## 2017-01-23 ENCOUNTER — Telehealth: Payer: Self-pay | Admitting: Internal Medicine

## 2017-01-23 NOTE — Telephone Encounter (Signed)
All appts already scheduled per 6/26 los. - no additional appts scheduled.

## 2017-01-26 ENCOUNTER — Other Ambulatory Visit: Payer: Self-pay | Admitting: *Deleted

## 2017-01-26 DIAGNOSIS — C3492 Malignant neoplasm of unspecified part of left bronchus or lung: Secondary | ICD-10-CM

## 2017-01-27 ENCOUNTER — Other Ambulatory Visit (HOSPITAL_BASED_OUTPATIENT_CLINIC_OR_DEPARTMENT_OTHER): Payer: Medicare Other

## 2017-01-27 DIAGNOSIS — C3492 Malignant neoplasm of unspecified part of left bronchus or lung: Secondary | ICD-10-CM

## 2017-01-27 DIAGNOSIS — C3490 Malignant neoplasm of unspecified part of unspecified bronchus or lung: Secondary | ICD-10-CM | POA: Diagnosis present

## 2017-01-27 LAB — CBC WITH DIFFERENTIAL/PLATELET
BASO%: 1.6 % (ref 0.0–2.0)
Basophils Absolute: 0.4 10*3/uL — ABNORMAL HIGH (ref 0.0–0.1)
EOS%: 0.5 % (ref 0.0–7.0)
Eosinophils Absolute: 0.1 10*3/uL (ref 0.0–0.5)
HEMATOCRIT: 40.1 % (ref 34.8–46.6)
HGB: 12.9 g/dL (ref 11.6–15.9)
LYMPH%: 9.9 % — AB (ref 14.0–49.7)
MCH: 31.5 pg (ref 25.1–34.0)
MCHC: 32.2 g/dL (ref 31.5–36.0)
MCV: 97.8 fL (ref 79.5–101.0)
MONO#: 2.8 10*3/uL — AB (ref 0.1–0.9)
MONO%: 10.3 % (ref 0.0–14.0)
NEUT%: 77.7 % — ABNORMAL HIGH (ref 38.4–76.8)
NEUTROS ABS: 20.9 10*3/uL — AB (ref 1.5–6.5)
Platelets: 292 10*3/uL (ref 145–400)
RBC: 4.1 10*6/uL (ref 3.70–5.45)
RDW: 17.7 % — ABNORMAL HIGH (ref 11.2–14.5)
WBC: 27 10*3/uL — AB (ref 3.9–10.3)
lymph#: 2.7 10*3/uL (ref 0.9–3.3)
nRBC: 2 % — ABNORMAL HIGH (ref 0–0)

## 2017-01-27 LAB — COMPREHENSIVE METABOLIC PANEL
ALBUMIN: 3.3 g/dL — AB (ref 3.5–5.0)
ALK PHOS: 191 U/L — AB (ref 40–150)
ALT: 13 U/L (ref 0–55)
ANION GAP: 13 meq/L — AB (ref 3–11)
AST: 13 U/L (ref 5–34)
BUN: 8.6 mg/dL (ref 7.0–26.0)
CALCIUM: 10 mg/dL (ref 8.4–10.4)
CO2: 24 mEq/L (ref 22–29)
Chloride: 104 mEq/L (ref 98–109)
Creatinine: 0.7 mg/dL (ref 0.6–1.1)
Glucose: 100 mg/dl (ref 70–140)
POTASSIUM: 4 meq/L (ref 3.5–5.1)
Sodium: 141 mEq/L (ref 136–145)
Total Bilirubin: <0.22 mg/dL (ref 0.20–1.20)
Total Protein: 6.8 g/dL (ref 6.4–8.3)

## 2017-02-03 ENCOUNTER — Other Ambulatory Visit (HOSPITAL_BASED_OUTPATIENT_CLINIC_OR_DEPARTMENT_OTHER): Payer: Medicare Other

## 2017-02-03 ENCOUNTER — Telehealth: Payer: Self-pay | Admitting: Medical Oncology

## 2017-02-03 ENCOUNTER — Other Ambulatory Visit: Payer: Self-pay | Admitting: Medical Oncology

## 2017-02-03 DIAGNOSIS — C3490 Malignant neoplasm of unspecified part of unspecified bronchus or lung: Secondary | ICD-10-CM | POA: Diagnosis present

## 2017-02-03 DIAGNOSIS — C3492 Malignant neoplasm of unspecified part of left bronchus or lung: Secondary | ICD-10-CM

## 2017-02-03 LAB — CBC WITH DIFFERENTIAL/PLATELET
BASO%: 1.1 % (ref 0.0–2.0)
Basophils Absolute: 0.1 10*3/uL (ref 0.0–0.1)
EOS ABS: 0 10*3/uL (ref 0.0–0.5)
EOS%: 0.3 % (ref 0.0–7.0)
HCT: 36.6 % (ref 34.8–46.6)
HEMOGLOBIN: 12.2 g/dL (ref 11.6–15.9)
LYMPH#: 1 10*3/uL (ref 0.9–3.3)
LYMPH%: 10 % — ABNORMAL LOW (ref 14.0–49.7)
MCH: 31.6 pg (ref 25.1–34.0)
MCHC: 33.3 g/dL (ref 31.5–36.0)
MCV: 95 fL (ref 79.5–101.0)
MONO#: 0.6 10*3/uL (ref 0.1–0.9)
MONO%: 6.3 % (ref 0.0–14.0)
NEUT%: 82.3 % — ABNORMAL HIGH (ref 38.4–76.8)
NEUTROS ABS: 8.3 10*3/uL — AB (ref 1.5–6.5)
Platelets: 298 10*3/uL (ref 145–400)
RBC: 3.85 10*6/uL (ref 3.70–5.45)
RDW: 18.1 % — AB (ref 11.2–14.5)
WBC: 10.1 10*3/uL (ref 3.9–10.3)

## 2017-02-03 LAB — COMPREHENSIVE METABOLIC PANEL
ALBUMIN: 2.8 g/dL — AB (ref 3.5–5.0)
ALK PHOS: 142 U/L (ref 40–150)
ANION GAP: 12 meq/L — AB (ref 3–11)
AST: 9 U/L (ref 5–34)
BUN: 7.1 mg/dL (ref 7.0–26.0)
CO2: 26 mEq/L (ref 22–29)
Calcium: 9.7 mg/dL (ref 8.4–10.4)
Chloride: 103 mEq/L (ref 98–109)
Creatinine: 0.6 mg/dL (ref 0.6–1.1)
GLUCOSE: 107 mg/dL (ref 70–140)
Potassium: 4 mEq/L (ref 3.5–5.1)
Sodium: 141 mEq/L (ref 136–145)
TOTAL PROTEIN: 6.8 g/dL (ref 6.4–8.3)

## 2017-02-03 LAB — UA PROTEIN, DIPSTICK - CHCC: Protein, ur: <30 mg/dL

## 2017-02-03 NOTE — Telephone Encounter (Signed)
err

## 2017-02-03 NOTE — Telephone Encounter (Signed)
Returned call to pt . She reports pains in back and shoulder at the "left wing blade and she cant sleep area is sore and tender". It has been going on a while and been tolerable but not anymore. She states it feels like she has been punched multiple times in this area.  She has taking multiple OTC anti-inflammatory, pain meds. and alternates icy hot, thermacare. Mirtazepine doesn't' keep me asleep through the pain.

## 2017-02-05 ENCOUNTER — Inpatient Hospital Stay (HOSPITAL_COMMUNITY)
Admission: EM | Admit: 2017-02-05 | Discharge: 2017-02-08 | DRG: 193 | Disposition: A | Discharge status: Home / Self Care | Payer: Medicare Other | Attending: Family Medicine | Admitting: Family Medicine

## 2017-02-05 ENCOUNTER — Emergency Department (HOSPITAL_COMMUNITY): Payer: Medicare Other

## 2017-02-05 ENCOUNTER — Encounter (HOSPITAL_COMMUNITY): Payer: Self-pay | Admitting: Emergency Medicine

## 2017-02-05 ENCOUNTER — Telehealth: Payer: Self-pay | Admitting: Medical Oncology

## 2017-02-05 DIAGNOSIS — Z825 Family history of asthma and other chronic lower respiratory diseases: Secondary | ICD-10-CM | POA: Diagnosis not present

## 2017-02-05 DIAGNOSIS — F32A Depression, unspecified: Secondary | ICD-10-CM | POA: Diagnosis present

## 2017-02-05 DIAGNOSIS — K219 Gastro-esophageal reflux disease without esophagitis: Secondary | ICD-10-CM | POA: Diagnosis present

## 2017-02-05 DIAGNOSIS — J9601 Acute respiratory failure with hypoxia: Secondary | ICD-10-CM | POA: Diagnosis present

## 2017-02-05 DIAGNOSIS — Y95 Nosocomial condition: Secondary | ICD-10-CM | POA: Diagnosis present

## 2017-02-05 DIAGNOSIS — J189 Pneumonia, unspecified organism: Secondary | ICD-10-CM | POA: Diagnosis present

## 2017-02-05 DIAGNOSIS — C3492 Malignant neoplasm of unspecified part of left bronchus or lung: Secondary | ICD-10-CM | POA: Diagnosis not present

## 2017-02-05 DIAGNOSIS — G9341 Metabolic encephalopathy: Secondary | ICD-10-CM | POA: Diagnosis present

## 2017-02-05 DIAGNOSIS — C3490 Malignant neoplasm of unspecified part of unspecified bronchus or lung: Secondary | ICD-10-CM

## 2017-02-05 DIAGNOSIS — R0902 Hypoxemia: Secondary | ICD-10-CM

## 2017-02-05 DIAGNOSIS — F329 Major depressive disorder, single episode, unspecified: Secondary | ICD-10-CM | POA: Diagnosis present

## 2017-02-05 DIAGNOSIS — F411 Generalized anxiety disorder: Secondary | ICD-10-CM | POA: Diagnosis present

## 2017-02-05 DIAGNOSIS — R410 Disorientation, unspecified: Secondary | ICD-10-CM

## 2017-02-05 DIAGNOSIS — Z79899 Other long term (current) drug therapy: Secondary | ICD-10-CM

## 2017-02-05 DIAGNOSIS — I472 Ventricular tachycardia: Secondary | ICD-10-CM | POA: Diagnosis present

## 2017-02-05 DIAGNOSIS — J440 Chronic obstructive pulmonary disease with acute lower respiratory infection: Secondary | ICD-10-CM | POA: Diagnosis present

## 2017-02-05 DIAGNOSIS — I10 Essential (primary) hypertension: Secondary | ICD-10-CM | POA: Diagnosis present

## 2017-02-05 DIAGNOSIS — C3412 Malignant neoplasm of upper lobe, left bronchus or lung: Secondary | ICD-10-CM | POA: Diagnosis present

## 2017-02-05 DIAGNOSIS — J9600 Acute respiratory failure, unspecified whether with hypoxia or hypercapnia: Secondary | ICD-10-CM | POA: Diagnosis present

## 2017-02-05 DIAGNOSIS — Z923 Personal history of irradiation: Secondary | ICD-10-CM | POA: Diagnosis not present

## 2017-02-05 DIAGNOSIS — J441 Chronic obstructive pulmonary disease with (acute) exacerbation: Secondary | ICD-10-CM | POA: Diagnosis not present

## 2017-02-05 DIAGNOSIS — E876 Hypokalemia: Secondary | ICD-10-CM | POA: Diagnosis present

## 2017-02-05 LAB — CBC
HEMATOCRIT: 34.1 % — AB (ref 36.0–46.0)
Hemoglobin: 11.1 g/dL — ABNORMAL LOW (ref 12.0–15.0)
MCH: 31.3 pg (ref 26.0–34.0)
MCHC: 32.6 g/dL (ref 30.0–36.0)
MCV: 96.1 fL (ref 78.0–100.0)
Platelets: 387 10*3/uL (ref 150–400)
RBC: 3.55 MIL/uL — ABNORMAL LOW (ref 3.87–5.11)
RDW: 18.4 % — AB (ref 11.5–15.5)
WBC: 12.1 10*3/uL — ABNORMAL HIGH (ref 4.0–10.5)

## 2017-02-05 LAB — COMPREHENSIVE METABOLIC PANEL
ALT: 8 U/L — ABNORMAL LOW (ref 14–54)
ANION GAP: 14 (ref 5–15)
AST: 16 U/L (ref 15–41)
Albumin: 3.1 g/dL — ABNORMAL LOW (ref 3.5–5.0)
Alkaline Phosphatase: 115 U/L (ref 38–126)
BILIRUBIN TOTAL: 0.3 mg/dL (ref 0.3–1.2)
BUN: 9 mg/dL (ref 6–20)
CO2: 23 mmol/L (ref 22–32)
Calcium: 8.8 mg/dL — ABNORMAL LOW (ref 8.9–10.3)
Chloride: 105 mmol/L (ref 101–111)
Creatinine, Ser: 0.46 mg/dL (ref 0.44–1.00)
GFR calc Af Amer: >60 mL/min (ref >60)
Glucose, Bld: 85 mg/dL (ref 65–99)
POTASSIUM: 3.2 mmol/L — AB (ref 3.5–5.1)
Sodium: 142 mmol/L (ref 135–145)
TOTAL PROTEIN: 6.6 g/dL (ref 6.5–8.1)

## 2017-02-05 LAB — BLOOD GAS, ARTERIAL
Acid-base deficit: 0.6 mmol/L (ref 0.0–2.0)
Bicarbonate: 25.4 mmol/L (ref 20.0–28.0)
Drawn by: 441261
O2 Content: 3 L/min
O2 Saturation: 91.3 %
PCO2 ART: 47.3 mmHg (ref 32.0–48.0)
PH ART: 7.349 — AB (ref 7.350–7.450)
PO2 ART: 70.9 mmHg — AB (ref 83.0–108.0)
Patient temperature: 98.6

## 2017-02-05 LAB — CBC WITH DIFFERENTIAL/PLATELET
BASOS PCT: 1 %
Basophils Absolute: 0.1 10*3/uL (ref 0.0–0.1)
EOS ABS: 0 10*3/uL (ref 0.0–0.7)
Eosinophils Relative: 1 %
HEMATOCRIT: 34.8 % — AB (ref 36.0–46.0)
Hemoglobin: 11.6 g/dL — ABNORMAL LOW (ref 12.0–15.0)
Lymphocytes Relative: 13 %
Lymphs Abs: 1.1 10*3/uL (ref 0.7–4.0)
MCH: 31.4 pg (ref 26.0–34.0)
MCHC: 33.3 g/dL (ref 30.0–36.0)
MCV: 94.3 fL (ref 78.0–100.0)
MONO ABS: 1.1 10*3/uL — AB (ref 0.1–1.0)
Monocytes Relative: 13 %
NEUTROS ABS: 5.9 10*3/uL (ref 1.7–7.7)
NEUTROS PCT: 72 %
Platelets: 392 10*3/uL (ref 150–400)
RBC: 3.69 MIL/uL — ABNORMAL LOW (ref 3.87–5.11)
RDW: 18.3 % — AB (ref 11.5–15.5)
WBC: 8.1 10*3/uL (ref 4.0–10.5)

## 2017-02-05 LAB — I-STAT CG4 LACTIC ACID, ED
LACTIC ACID, VENOUS: 0.87 mmol/L (ref 0.5–1.9)
LACTIC ACID, VENOUS: 0.45 mmol/L — AB (ref 0.5–1.9)

## 2017-02-05 LAB — PROTIME-INR
INR: 1.19
PROTHROMBIN TIME: 15.2 s (ref 11.4–15.2)

## 2017-02-05 LAB — CREATININE, SERUM
Creatinine, Ser: 0.47 mg/dL (ref 0.44–1.00)
GFR calc Af Amer: >60 mL/min (ref >60)

## 2017-02-05 LAB — URINALYSIS, ROUTINE W REFLEX MICROSCOPIC
BILIRUBIN URINE: NEGATIVE
Glucose, UA: NEGATIVE mg/dL
HGB URINE DIPSTICK: NEGATIVE
KETONES UR: 20 mg/dL — AB
Leukocytes, UA: NEGATIVE
Nitrite: NEGATIVE
PROTEIN: NEGATIVE mg/dL
Specific Gravity, Urine: 1.023 (ref 1.005–1.030)
pH: 5 (ref 5.0–8.0)

## 2017-02-05 LAB — I-STAT TROPONIN, ED: Troponin i, poc: 0.01 ng/mL (ref 0.00–0.08)

## 2017-02-05 LAB — MRSA PCR SCREENING: MRSA BY PCR: NEGATIVE

## 2017-02-05 MED ORDER — FAMOTIDINE 20 MG PO TABS: 20.0000 mg | ORAL_TABLET | Freq: Every day | ORAL | Status: DC | PRN

## 2017-02-05 MED ORDER — ONDANSETRON HCL 4 MG/2ML IJ SOLN: 4.0000 mg | Freq: Four times a day (QID) | INTRAMUSCULAR | Status: DC | PRN

## 2017-02-05 MED ORDER — VANCOMYCIN HCL IN DEXTROSE 1-5 GM/200ML-% IV SOLN
1000.0000 mg | Freq: Once | INTRAVENOUS | Status: AC
  Administered 2017-02-05: 1000 mg via INTRAVENOUS
  Filled 2017-02-05: qty 200

## 2017-02-05 MED ORDER — SODIUM CHLORIDE 0.9 % IV SOLN
INTRAVENOUS | Status: DC
  Administered 2017-02-05 – 2017-02-06 (×4): via INTRAVENOUS

## 2017-02-05 MED ORDER — ALBUTEROL SULFATE (2.5 MG/3ML) 0.083% IN NEBU: 2.5000 mg | INHALATION_SOLUTION | Freq: Four times a day (QID) | RESPIRATORY_TRACT | Status: DC | PRN

## 2017-02-05 MED ORDER — DEXTROSE 5 % IV SOLN
1.0000 g | Freq: Once | INTRAVENOUS | Status: AC
  Administered 2017-02-05: 1 g via INTRAVENOUS
  Filled 2017-02-05: qty 1

## 2017-02-05 MED ORDER — HEPARIN SODIUM (PORCINE) 5000 UNIT/ML IJ SOLN
5000.0000 [IU] | Freq: Three times a day (TID) | INTRAMUSCULAR | Status: DC
  Administered 2017-02-05 – 2017-02-08 (×8): 5000 [IU] via SUBCUTANEOUS
  Filled 2017-02-05 (×7): qty 1

## 2017-02-05 MED ORDER — ONDANSETRON HCL 4 MG PO TABS
4.0000 mg | ORAL_TABLET | Freq: Four times a day (QID) | ORAL | Status: DC | PRN
Start: 2017-02-05 — End: 2017-02-08

## 2017-02-05 MED ORDER — IOPAMIDOL (ISOVUE-370) INJECTION 76%
INTRAVENOUS | Status: AC
  Administered 2017-02-05: 100 mL via INTRAVENOUS
  Filled 2017-02-05: qty 100

## 2017-02-05 MED ORDER — DEXTROSE 5 % IV SOLN
2.0000 g | Freq: Two times a day (BID) | INTRAVENOUS | Status: DC
  Administered 2017-02-05 – 2017-02-08 (×5): 2 g via INTRAVENOUS
  Filled 2017-02-05 (×6): qty 2

## 2017-02-05 MED ORDER — MORPHINE SULFATE (PF) 4 MG/ML IV SOLN
4.0000 mg | Freq: Once | INTRAVENOUS | Status: AC
  Administered 2017-02-05: 4 mg via INTRAVENOUS
  Filled 2017-02-05: qty 1

## 2017-02-05 MED ORDER — IPRATROPIUM-ALBUTEROL 0.5-2.5 (3) MG/3ML IN SOLN
3.0000 mL | Freq: Four times a day (QID) | RESPIRATORY_TRACT | Status: DC
  Administered 2017-02-05 – 2017-02-06 (×2): 3 mL via RESPIRATORY_TRACT
  Filled 2017-02-05 (×2): qty 3

## 2017-02-05 MED ORDER — VANCOMYCIN HCL IN DEXTROSE 1-5 GM/200ML-% IV SOLN
1000.0000 mg | Freq: Two times a day (BID) | INTRAVENOUS | Status: DC
  Administered 2017-02-06 – 2017-02-07 (×3): 1000 mg via INTRAVENOUS
  Filled 2017-02-05 (×3): qty 200

## 2017-02-05 MED ORDER — LORATADINE 10 MG PO TABS
10.0000 mg | ORAL_TABLET | Freq: Every day | ORAL | Status: DC
  Administered 2017-02-06 – 2017-02-08 (×3): 10 mg via ORAL
  Filled 2017-02-05 (×3): qty 1

## 2017-02-05 MED ORDER — ACETAMINOPHEN 650 MG RE SUPP: 650.0000 mg | Freq: Four times a day (QID) | RECTAL | Status: DC | PRN

## 2017-02-05 MED ORDER — SODIUM CHLORIDE 0.9 % IV SOLN
Freq: Once | INTRAVENOUS | Status: AC
  Administered 2017-02-05: 13:00:00 via INTRAVENOUS

## 2017-02-05 MED ORDER — IOPAMIDOL (ISOVUE-370) INJECTION 76%
100.0000 mL | Freq: Once | INTRAVENOUS | Status: AC | PRN
  Administered 2017-02-05: 100 mL via INTRAVENOUS

## 2017-02-05 MED ORDER — ACETAMINOPHEN 325 MG PO TABS
650.0000 mg | ORAL_TABLET | Freq: Four times a day (QID) | ORAL | Status: DC | PRN
  Administered 2017-02-06 – 2017-02-07 (×5): 650 mg via ORAL
  Filled 2017-02-05 (×5): qty 2

## 2017-02-05 NOTE — ED Triage Notes (Signed)
Pt complaint of productive cough and weakness over past few days; hx of lung cancer; chemo 3 weeks ago.

## 2017-02-05 NOTE — H&P (Signed)
History and Physical    Heather Banks DVV:[id number] DOB: 10-06-1950 DOA: 02/05/2017  Referring MD/NP/PA: EDP PCP:  Patient coming from:  Home  Chief Complaint: cough, confusion  HPI: Heather Banks is a 66 y.o. female with medical history significant of COPD, Stage 4 lung cancer on chemotherpay followed by Dr.Mohamed, Anxiety, depression was brought to the ED by her family with complaints of cough-productive of phlegm, dyspnea, confusion/lethargy since yesterday, they also noticed that her O2 sats were in the 80s this morning as well. No fevers or chills, last chemo reportedly 3 weeks ago  ED Course: CTA chest, no PE, concerning for pneumonia, hypoxia  Review of Systems: limited due to mentation  Past Medical History:  Diagnosis Date  . Adenocarcinoma of left lung, stage 4 ([place]) 07/09/2014  . Compression fracture of thoracic vertebra (HCC) 04/21/2016  . COPD (chronic obstructive pulmonary disease) (HCC)    Albuterol neb as needed;Pulmicort neb daily  . Encounter for antineoplastic chemotherapy 01/21/2015  . Full code status 05/01/2015  . GERD (gastroesophageal reflux disease)    takes Pantoprazole and Zantac daily  . History of bronchitis    >5 yrs ago  . History of migraine    last one 2 wks ago  . Hx of radiation therapy 10/29/2015 to 11/16/2015   The Left upper lobe was treated to 37.5 Gy in 15 fractions at 2.5 Gy per fraction  . Lung mass   . Panic attacks    but doesn't take any meds  . Pneumonia    hx of-last time about 4+yrs ago    Past Surgical History:  Procedure Laterality Date  . ABDOMINAL HYSTERECTOMY    . APPENDECTOMY    . IR GENERIC HISTORICAL  05/02/2016   IR RADIOLOGIST EVAL & MGMT 05/02/2016 [facility]  . IR GENERIC HISTORICAL  05/08/2016   IR VERTEBROPLASTY EA ADDL (T&LS) BX INC UNI/BIL INC INJECT/IMAGING 05/08/2016 Luanne Bras, MD [facility]  . IR GENERIC HISTORICAL  05/08/2016   IR VERTEBROPLASTY CERV/THOR BX INC UNI/BIL INC/INJECT/IMAGING  05/08/2016 Luanne Bras, MD [facility]  . IR GENERIC HISTORICAL  05/29/2016   IR RADIOLOGIST EVAL & MGMT 05/29/2016 [facility]  . OOPHORECTOMY    . PORTACATH PLACEMENT  jan. 2016  . TONSILLECTOMY    . VIDEO BRONCHOSCOPY WITH ENDOBRONCHIAL NAVIGATION N/A 08/14/2014   Procedure: VIDEO BRONCHOSCOPY WITH ENDOBRONCHIAL NAVIGATION;  Surgeon: Rigoberto Noel, MD;  Location: [place];  Service: Thoracic;  Laterality: N/A;     reports that she quit smoking about 10 months ago. Her smoking use included Cigarettes. She has a 11.00 pack-year smoking history. She has never used smokeless tobacco. She reports that she does not drink alcohol or use drugs.  Allergies  Allergen Reactions  . Ativan [Lorazepam] Other (See Comments)    Reaction:  Makes pt angry - opposite reaction of med should do  . Cinnamon Other (See Comments)    Seasonal scent trigger migraine as well as pt's overall immune system    Family History  Problem Relation Age of Onset  . COPD Father      Prior to Admission medications   Medication Sig Start Date End Date Taking? Authorizing Provider  albuterol (PROVENTIL) (2.5 MG/3ML) 0.083% nebulizer solution Take 3 mLs (2.5 mg total) by nebulization every 6 (six) hours as needed for wheezing or shortness of breath. 06/13/15  Yes Rigoberto Noel, MD  ALPRAZolam Duanne Moron) 0.25 MG tablet Take 1 tablet (0.25 mg total) by mouth 2 (two) times daily as needed  for anxiety. Patient taking differently: Take 0.25 mg by mouth 2 (two) times daily.  01/20/17  Yes Curt Bears, MD  amLODipine (NORVASC) 5 MG tablet Take 1 tablet (5 mg total) by mouth daily. 09/15/16  Yes Micheline Chapman, NP  dexamethasone (DECADRON) 4 MG tablet 2 tablets by mouth twice a day the day before, day of and day after the chemotherapy every 3 weeks. 01/20/17  Yes Curt Bears, MD  famotidine (PEPCID) 20 MG tablet Take 1 tablet (20 mg total) by mouth daily. Patient taking differently: Take 20 mg by mouth daily as  needed for heartburn.  05/24/16  Yes Rosita Fire, MD  lidocaine-prilocaine (EMLA) cream Apply 1 application topically as needed. Apply 1 tsp over port site 1-1.5 hours prior to chemotherapy. 10/07/16  Yes Curt Bears, MD  loratadine (CLARITIN) 10 MG tablet Take 1 tablet (10 mg total) by mouth daily. 06/03/16  Yes Micheline Chapman, NP  mirtazapine (REMERON) 30 MG tablet Take 0.5 tablets (15 mg total) by mouth at bedtime as needed (for sleep). 09/16/16  Yes Curt Bears, MD  Tiotropium Bromide Monohydrate (SPIRIVA RESPIMAT) 2.5 MCG/ACT AERS Inhale 2 puffs into the lungs daily. 06/02/16  Yes Rigoberto Noel, MD  umeclidinium-vilanterol (ANORO ELLIPTA) 62.5-25 MCG/INH AEPB Inhale 1 puff into the lungs daily. 11/10/16  Yes Rigoberto Noel, MD  VENTOLIN HFA 108 (90 Base) MCG/ACT inhaler INHALE 2 PUFFS BY MOUTH EVERY 6 HOURS AS NEEDED FOR WHEEZE OR SHORTNESS OF BREATH 08/11/16  Yes Rigoberto Noel, MD  acidophilus (RISAQUAD) CAPS capsule Take 1 capsule by mouth daily.    [provider]  aspirin-acetaminophen-caffeine (EXCEDRIN MIGRAINE) [phone] MG per tablet Take 2 tablets by mouth every 6 (six) hours as needed for headache.    [provider]  calcium carbonate (OS-CAL - DOSED IN MG OF ELEMENTAL CALCIUM) 1250 (500 Ca) MG tablet Take 1 tablet by mouth daily with breakfast.    [provider]  cholecalciferol (VITAMIN D) 1000 units tablet Take 1,000 Units by mouth daily.    [provider]  cyanocobalamin 2000 MCG tablet Take 2,000 mcg by mouth daily.    [provider]  diphenhydrAMINE (BENADRYL) 25 mg capsule Take 25 mg by mouth every 6 (six) hours as needed for itching. Reported on 01/19/2016    [provider]  hydroxypropyl methylcellulose / hypromellose (ISOPTO TEARS / GONIOVISC) 2.5 % ophthalmic solution 1 drop.    [provider]  Lidocaine (ASPERCREME LIDOCAINE) 4 % PTCH Apply 1 patch topically daily as needed. Place on back  as needed for pain    [provider]  ondansetron (ZOFRAN) 8 MG tablet Take 1 tablet (8 mg total) by mouth every 8 (eight) hours as needed for nausea or vomiting. 08/29/16   Heath Lark, MD  prochlorperazine (COMPAZINE) 10 MG tablet Take 1 tablet (10 mg total) by mouth every 6 (six) hours as needed for nausea or vomiting. 01/20/17   Curt Bears, MD    Physical Exam: Vitals:   02/05/17 1122 02/05/17 1125 02/05/17 1312 02/05/17 1442  BP: (!) 127/91  123/78 110/76  Pulse: (!) 101  89 82  Resp: (!) 24  20 18   Temp: 98.5 F (36.9 C)     TempSrc: Oral     SpO2: (!) 85% 91% 94% 99%  Weight: 78 kg (172 lb)     Height: 5\' 5"  (1.651 m)         Constitutional: drowsy, arousable, mumbles few words and then drifts  off to sleep Vitals:   02/05/17 1122 02/05/17 1125 02/05/17 1312 02/05/17 1442  BP: (!) 127/91  123/78 110/76  Pulse: (!) 101  89 82  Resp: (!) 24  20 18   Temp: 98.5 F (36.9 C)     TempSrc: Oral     SpO2: (!) 85% 91% 94% 99%  Weight: 78 kg (172 lb)     Height: 5\' 5"  (1.651 m)      Eyes: PERRL, lids and conjunctivae normal ENMT: Mucous membranes are dry, poor dentition Neck: normal, supple, no masses, no thyromegaly Respiratory: few ronchi on R Cardiovascular: Regular rate and rhythm, no murmurs Abdomen: no tenderness, no masses palpated.Bowel sounds positive.  Musculoskeletal: + clubbing / cyanosis.  Skin: no rashes, lesions, ulcers. No induration Neurologic: confused, moves all extremities, non focal Psychiatric: confused  Labs on Admission: I have personally reviewed following labs and imaging studies  CBC:  Recent Labs Lab 02/03/17 0920 02/05/17 1137  WBC 10.1 8.1  NEUTROABS 8.3* 5.9  HGB 12.2 11.6*  HCT 36.6 34.8*  MCV 95.0 94.3  PLT 298 478   Basic Metabolic Panel:  Recent Labs Lab 02/03/17 0920 02/05/17 1137  NA 141 142  K 4.0 3.2*  CL  --  105  CO2 26 23  GLUCOSE 107 85  BUN 7.1 9  CREATININE 0.6 0.46  CALCIUM 9.7 8.8*    GFR: Estimated Creatinine Clearance: 74.3 mL/min (by C-G formula based on SCr of 0.46 mg/dL). Liver Function Tests:  Recent Labs Lab 02/03/17 0920 02/05/17 1137  AST 9 16  ALT <6 8*  ALKPHOS 142 115  BILITOT <0.22 0.3  PROT 6.8 6.6  ALBUMIN 2.8* 3.1*   No results for input(s): LIPASE, AMYLASE in the last 168 hours. No results for input(s): AMMONIA in the last 168 hours. Coagulation Profile:  Recent Labs Lab 02/05/17 1137  INR 1.19   Cardiac Enzymes: No results for input(s): CKTOTAL, CKMB, CKMBINDEX, TROPONINI in the last 168 hours. BNP (last 3 results) No results for input(s): PROBNP in the last 8760 hours. HbA1C: No results for input(s): HGBA1C in the last 72 hours. CBG: No results for input(s): GLUCAP in the last 168 hours. Lipid Profile: No results for input(s): CHOL, HDL, LDLCALC, TRIG, CHOLHDL, LDLDIRECT in the last 72 hours. Thyroid Function Tests: No results for input(s): TSH, T4TOTAL, FREET4, T3FREE, THYROIDAB in the last 72 hours. Anemia Panel: No results for input(s): VITAMINB12, FOLATE, FERRITIN, TIBC, IRON, RETICCTPCT in the last 72 hours. Urine analysis:    Component Value Date/Time   COLORURINE YELLOW 02/05/2017 [place] 02/05/2017 1353   LABSPEC 1.023 02/05/2017 1353   PHURINE 5.0 02/05/2017 1353   GLUCOSEU NEGATIVE 02/05/2017 1353   HGBUR NEGATIVE 02/05/2017 1353   BILIRUBINUR NEGATIVE 02/05/2017 1353   KETONESUR 20 (A) 02/05/2017 1353   PROTEINUR NEGATIVE 02/05/2017 1353   NITRITE NEGATIVE 02/05/2017 1353   LEUKOCYTESUR NEGATIVE 02/05/2017 1353   Sepsis Labs: @LABRCNTIP (procalcitonin:4,lacticidven:4) )No results found for this or any previous visit (from the past 240 hour(s)).   Radiological Exams on Admission: Dg Chest 2 View  Result Date: 02/05/2017 CLINICAL DATA:  Weakness. Shortness of breath. History lung cancer. EXAM: CHEST  2 VIEW COMPARISON:  05/18/2016 FINDINGS: Lateral view degraded by patient arm position.  Vertebral augmentation and upper thoracic levels. A right Port-A-Cath terminates at the low SVC or high right atrium. Patient rotated right on the frontal. Normal heart size. Atherosclerosis in the transverse aorta. No pleural effusion or pneumothorax. Right greater than  left upper lobe opacities with primarily left-sided apical thickening. Diffuse peribronchial thickening. Clear lower lungs. IMPRESSION: Right greater than left upper lobe and apical opacities. Given history of lung cancer, radiation therapy, and the appearance on prior CT, findings are at least partially favored to be due to scarring. However, especially at the right upper lobe, patchy pneumonia cannot be excluded. Short term radiographic follow-up in 2-3 days should be considered. Peribronchial thickening which may relate to chronic bronchitis or smoking. Aortic Atherosclerosis (ICD10-I70.0). Electronically Signed   By: Abigail Miyamoto M.D.   On: 02/05/2017 12:32   Ct Head Wo Contrast  Result Date: 02/05/2017 CLINICAL DATA:  Confusion, history lung cancer EXAM: CT HEAD WITHOUT CONTRAST TECHNIQUE: Contiguous axial images were obtained from the base of the skull through the vertex without intravenous contrast. Sagittal and coronal MPR images reconstructed from axial data set. COMPARISON:  None FINDINGS: Brain: Normal ventricular morphology. No midline shift or mass effect. Normal appearance of brain parenchyma. No intracranial hemorrhage, mass lesion or evidence acute infarction. No extra-axial fluid collections. Vascular: Minimal atherosclerotic calcification within internal carotid arteries at skullbase Skull: Intact Sinuses/Orbits: Opacification within sphenoid sinus with mild sphenoid sinus osseous wall thickening. Concha bullosa of the middle turbinates. Other: N/A IMPRESSION: No acute intracranial abnormalities. Sphenoid sinus disease. Electronically Signed   By: Lavonia Dana M.D.   On: 02/05/2017 13:54   Ct Angio Chest Pe W/cm &/or Wo  Cm  Result Date: 02/05/2017 CLINICAL DATA:  Stage IV adenocarcinoma of the left upper lobe. Persistent left-sided pain. Confusion. EXAM: CT ANGIOGRAPHY CHEST CT ABDOMEN AND PELVIS WITH CONTRAST TECHNIQUE: Multidetector CT imaging of the chest was performed using the standard protocol during bolus administration of intravenous contrast. Multiplanar CT image reconstructions and MIPs were obtained to evaluate the vascular anatomy. Multidetector CT imaging of the abdomen and pelvis was performed using the standard protocol during bolus administration of intravenous contrast. CONTRAST:  100 cc of Isovue 370 COMPARISON:  Chest radiograph of earlier today.  CTs of 11/14/2016. FINDINGS: CTA CHEST FINDINGS Cardiovascular:  Mild motion degradation The quality of this exam for evaluation of pulmonary embolism is moderate to good. No evidence of pulmonary embolism. Advanced aortic and branch vessel atherosclerosis. Mild cardiomegaly with lipomatous hypertrophy of the interatrial septum. A right Port-A-Cath which terminates in the right atrium. Mediastinum/Nodes: No supraclavicular adenopathy. No mediastinal or hilar adenopathy. Tiny hiatal hernia. Lungs/Pleura: No pleural fluid. Mild left-sided pleural thickening is similar. Moderate centrilobular emphysema. Lower lobe predominant bronchial wall thickening. Spiculated right apical pulmonary nodule measures 1.6 x 1.1 cm on image 22/series 8 and is felt to be similar to 1.7 x 1.5 cm on the prior. This solid component of a subjacent cavitary lesion measures 1.6 x 1.2 cm on image 27/series 8. Compare 1.6 x 1.1 cm on the prior. Right middle lobe ground-glass opacity measures on the order of 2.6 x 2.1 cm today versus 3.3 x 2.3 cm on the prior. Left apical and central upper lobe masslike consolidation. Example at 3.1 x 2.6 cm on image 19/series 8. Similar. Extension toward the left suprahilar left upper lobe measures 2.8 x 1.8 cm on image 28/series 8. Compare 3.0 x 2.2 cm at the  same level on the prior. Mild anterior left lower lobe ground-glass opacity may be radiation induced but is new. A 3 mm left lower lobe pulmonary nodule is unchanged on image 37/series 8. Superior segment left lower lobe septal thickening and patchy airspace disease are new and may also be radiation induced. Musculoskeletal:  T3 and T4 moderate and severe compression deformities respectively. Status post vertebral augmentation. Similar. Accentuation of expected thoracic kyphosis about this level. Review of the MIP images confirms the above findings. CT ABDOMEN and PELVIS FINDINGS Hepatobiliary: Too small to characterize hepatic dome lesion. No suspicious liver lesion. Mild caudate lobe enlargement is nonspecific. The gallbladder is mildly distended. No pericholecystic edema or calcified stone. Upper normal intrahepatic ducts. No common duct dilatation. Pancreas: Mild pancreatic atrophy, without duct dilatation dominant mass. Spleen: Normal in size, without focal abnormality. Adrenals/Urinary Tract: Mild motion artifact within the upper abdomen. Normal adrenal glands. Bilateral too small to characterize renal lesions. 1.5 cm right renal cyst. No hydronephrosis. Stomach/Bowel: Gastric antral underdistention. Colonic stool burden suggests constipation. Normal small bowel. Vascular/Lymphatic: Non aneurysmal dilatation of the infrarenal aorta at 12.5 cm is not significantly changed. No abdominopelvic adenopathy. Reproductive: Hysterectomy.  No adnexal mass. Other: No significant free fluid. Musculoskeletal: Tarlov cysts. Review of the MIP images confirms the above findings. IMPRESSION: 1.  No evidence of pulmonary embolism. 2. Right upper lobe pulmonary nodules and left upper lobe/apical masslike consolidation, similar. A right middle lobe ground-glass opacity is slightly decreased. 3. No thoracic adenopathy. 4. Coronary artery atherosclerosis. Aortic Atherosclerosis (ICD10-I70.0). 5. Similar T3 and T4 compression  deformities, status post vertebral augmentation. 6. No acute process or evidence of metastatic disease in the abdomen or pelvis. 7. Gallbladder distension which is nonspecific. Correlate with right upper quadrant symptoms and possibly right upper quadrant ultrasound. 8.  Tiny hiatal hernia. 9. Areas of superior segment left lower lobe and left lower lobe mild ground-glass and airspace opacity are nonspecific but could be radiation induced. Given they are new since the prior exam, areas of infection cannot be excluded. Electronically Signed   By: Abigail Miyamoto M.D.   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 Persistent left-sided pain. Confusion. EXAM: CT ANGIOGRAPHY CHEST CT ABDOMEN AND PELVIS WITH CONTRAST TECHNIQUE: Multidetector CT imaging of the chest was performed using the standard protocol during bolus administration of intravenous contrast. Multiplanar CT image reconstructions and MIPs were obtained to evaluate the vascular anatomy. Multidetector CT imaging of the abdomen and pelvis was performed using the standard protocol during bolus administration of intravenous contrast. CONTRAST:  100 cc of Isovue 370 COMPARISON:  Chest radiograph of earlier today.  CTs of 11/14/2016. FINDINGS: CTA CHEST FINDINGS Cardiovascular:  Mild motion degradation The quality of this exam for evaluation of pulmonary embolism is moderate to good. No evidence of pulmonary embolism. Advanced aortic and branch vessel atherosclerosis. Mild cardiomegaly with lipomatous hypertrophy of the interatrial septum. A right Port-A-Cath which terminates in the right atrium. Mediastinum/Nodes: No supraclavicular adenopathy. No mediastinal or hilar adenopathy. Tiny hiatal hernia. Lungs/Pleura: No pleural fluid. Mild left-sided pleural thickening is similar. Moderate centrilobular emphysema. Lower lobe predominant bronchial wall thickening.  Spiculated right apical pulmonary nodule measures 1.6 x 1.1 cm on image 22/series 8 and is felt to be similar to 1.7 x 1.5 cm on the prior. This solid component of a subjacent cavitary lesion measures 1.6 x 1.2 cm on image 27/series 8. Compare 1.6 x 1.1 cm on the prior. Right middle lobe ground-glass opacity measures on the order of 2.6 x 2.1 cm today versus 3.3 x 2.3 cm on the prior. Left apical and central upper lobe masslike consolidation. Example at 3.1 x 2.6 cm on image 19/series 8. Similar. Extension toward the left suprahilar  left upper lobe measures 2.8 x 1.8 cm on image 28/series 8. Compare 3.0 x 2.2 cm at the same level on the prior. Mild anterior left lower lobe ground-glass opacity may be radiation induced but is new. A 3 mm left lower lobe pulmonary nodule is unchanged on image 37/series 8. Superior segment left lower lobe septal thickening and patchy airspace disease are new and may also be radiation induced. Musculoskeletal: T3 and T4 moderate and severe compression deformities respectively. Status post vertebral augmentation. Similar. Accentuation of expected thoracic kyphosis about this level. Review of the MIP images confirms the above findings. CT ABDOMEN and PELVIS FINDINGS Hepatobiliary: Too small to characterize hepatic dome lesion. No suspicious liver lesion. Mild caudate lobe enlargement is nonspecific. The gallbladder is mildly distended. No pericholecystic edema or calcified stone. Upper normal intrahepatic ducts. No common duct dilatation. Pancreas: Mild pancreatic atrophy, without duct dilatation dominant mass. Spleen: Normal in size, without focal abnormality. Adrenals/Urinary Tract: Mild motion artifact within the upper abdomen. Normal adrenal glands. Bilateral too small to characterize renal lesions. 1.5 cm right renal cyst. No hydronephrosis. Stomach/Bowel: Gastric antral underdistention. Colonic stool burden suggests constipation. Normal small bowel. Vascular/Lymphatic: Non  aneurysmal dilatation of the infrarenal aorta at 12.5 cm is not significantly changed. No abdominopelvic adenopathy. Reproductive: Hysterectomy.  No adnexal mass. Other: No significant free fluid. Musculoskeletal: Tarlov cysts. Review of the MIP images confirms the above findings. IMPRESSION: 1.  No evidence of pulmonary embolism. 2. Right upper lobe pulmonary nodules and left upper lobe/apical masslike consolidation, similar. A right middle lobe ground-glass opacity is slightly decreased. 3. No thoracic adenopathy. 4. Coronary artery atherosclerosis. Aortic Atherosclerosis (ICD10-I70.0). 5. Similar T3 and T4 compression deformities, status post vertebral augmentation. 6. No acute process or evidence of metastatic disease in the abdomen or pelvis. 7. Gallbladder distension which is nonspecific. Correlate with right upper quadrant symptoms and possibly right upper quadrant ultrasound. 8.  Tiny hiatal hernia. 9. Areas of superior segment left lower lobe and left lower lobe mild ground-glass and airspace opacity are nonspecific but could be radiation induced. Given they are new since the prior exam, areas of infection cannot be excluded. Electronically Signed   By: Abigail Miyamoto M.D.   On: 02/05/2017 14:24      Assessment/Plan Principal Problem:   Acute respiratory failure with hypoxia (HCC) -due to pnuemonia, underlying COPD, noted CT chest findings -not actively wheezing at this time -continue Vanc./Cefepime -FU blood cx -duonebs and albuterol PRN    Metabolic encephalopathy -likley due to above infection, CT head unremarkable -also check ABG to r/o CO2 narcosis, may need BIPAP -hold sedating meds   STage 4 lung CA -on active chemo per Dr.Mohamed -add Dr.Mohamed to Treatment team, last chemo reportedly 3 weeks ago    Generalized anxiety disorder/depression -holding low dose xanax and remeron now   COPD -without active wheezing, neds, FU ABG  DVT prophylaxis: Hep SQ Code Status: FUll  Code Family Communication: son at bedside Disposition Plan: admit to SDU  Admission status: inpatient  Domenic Polite MD [facility] Pager [phone]  If 7PM-7AM, please contact night-coverage [email or web address] Password [facility]  02/05/2017, 3:46 PM

## 2017-02-05 NOTE — ED Notes (Signed)
Patient transported to CT 

## 2017-02-05 NOTE — Telephone Encounter (Signed)
Mother called and left message that pt is disoriented "for 2 days , every time she tries to walk she falls. She went out to her car yesterday and she could not find ignition. Pt Mumbles.

## 2017-02-05 NOTE — ED Notes (Signed)
Any questions about patient care call POA daughter [name].

## 2017-02-05 NOTE — Telephone Encounter (Signed)
I called mother back and told her I see she is in ED and will notify Dr. Julien Nordmann.

## 2017-02-05 NOTE — ED Provider Notes (Signed)
[facility] DEPT Provider Note   CSN: [id number] Arrival date & time: 02/05/17  1113     History   Chief Complaint Chief Complaint  Patient presents with  . Altered Mental Status  . Cancer    HPI Heather Banks is a 66 y.o. female.  HPI Patient has stage IV adeno lung carcinoma. She is undergoing chemotherapy. She has been getting increasingly weak and confused over the past several days. She has had increased cough and dyspnea. Today family members noted her oxygen saturation to be low in the 80s. Patient has not been assigned home oxygen. She has not had documented fever. She is not on any ongoing pain medications. She notes she has been having increasing severe pain in her thoracic back. Past Medical History:  Diagnosis Date  . Adenocarcinoma of left lung, stage 4 ([place]) 07/09/2014  . Compression fracture of thoracic vertebra (HCC) 04/21/2016  . COPD (chronic obstructive pulmonary disease) (HCC)    Albuterol neb as needed;Pulmicort neb daily  . Encounter for antineoplastic chemotherapy 01/21/2015  . Full code status 05/01/2015  . GERD (gastroesophageal reflux disease)    takes Pantoprazole and Zantac daily  . History of bronchitis    >5 yrs ago  . History of migraine    last one 2 wks ago  . Hx of radiation therapy 10/29/2015 to 11/16/2015   The Left upper lobe was treated to 37.5 Gy in 15 fractions at 2.5 Gy per fraction  . Lung mass   . Panic attacks    but doesn't take any meds  . Pneumonia    hx of-last time about 4+yrs ago    Patient Active Problem List   Diagnosis Date Noted  . Elevated blood pressure reading   . COPD exacerbation ([place])   . Hyperglycemia 05/18/2016  . Abnormal TSH 05/18/2016  . Dyspnea   . Encounter for palliative care   . Compression fracture of thoracic vertebra (HCC) 04/21/2016  . Vertebral fracture, pathological 02/28/2016  . GERD (gastroesophageal reflux disease) 02/28/2016  . COPD with acute exacerbation ([place]) 02/28/2016  . History of  pulmonary embolus (PE) 02/06/2016  . Respiratory failure ([place]) 01/18/2016  . Primary cancer of left upper lobe of lung ([place]) 10/24/2015  . Drug-induced skin rash 07/31/2015  . Goals of care, counseling/discussion 06/19/2015  . Chronic fatigue 05/22/2015  . Nausea without vomiting 05/22/2015  . Full code status 05/01/2015  . COPD (chronic obstructive pulmonary disease) ([employer]) 02/05/2015  . Encounter for antineoplastic chemotherapy 01/21/2015  . Depression (emotion) 09/05/2014  . Generalized anxiety disorder 08/21/2014  . Smoking 07/11/2014  . Adenocarcinoma of left lung, stage 4 ([place]) 07/09/2014    Past Surgical History:  Procedure Laterality Date  . ABDOMINAL HYSTERECTOMY    . APPENDECTOMY    . IR GENERIC HISTORICAL  05/02/2016   IR RADIOLOGIST EVAL & MGMT 05/02/2016 [facility]  . IR GENERIC HISTORICAL  05/08/2016   IR VERTEBROPLASTY EA ADDL (T&LS) BX INC UNI/BIL INC INJECT/IMAGING 05/08/2016 Luanne Bras, MD [facility]  . IR GENERIC HISTORICAL  05/08/2016   IR VERTEBROPLASTY CERV/THOR BX INC UNI/BIL INC/INJECT/IMAGING 05/08/2016 Luanne Bras, MD [facility]  . IR GENERIC HISTORICAL  05/29/2016   IR RADIOLOGIST EVAL & MGMT 05/29/2016 [facility]  . OOPHORECTOMY    . PORTACATH PLACEMENT  jan. 2016  . TONSILLECTOMY    . VIDEO BRONCHOSCOPY WITH ENDOBRONCHIAL NAVIGATION N/A 08/14/2014   Procedure: VIDEO BRONCHOSCOPY WITH ENDOBRONCHIAL NAVIGATION;  Surgeon: Rigoberto Noel, MD;  Location: [facility];  Service:  Thoracic;  Laterality: N/A;    OB History    No data available       Home Medications    Prior to Admission medications   Medication Sig Start Date End Date Taking? Authorizing Provider  albuterol (PROVENTIL) (2.5 MG/3ML) 0.083% nebulizer solution Take 3 mLs (2.5 mg total) by nebulization every 6 (six) hours as needed for wheezing or shortness of breath. 06/13/15  Yes Rigoberto Noel, MD  ALPRAZolam Duanne Moron) 0.25 MG tablet Take 1 tablet (0.25 mg total) by mouth 2  (two) times daily as needed for anxiety. Patient taking differently: Take 0.25 mg by mouth 2 (two) times daily.  01/20/17  Yes Curt Bears, MD  amLODipine (NORVASC) 5 MG tablet Take 1 tablet (5 mg total) by mouth daily. 09/15/16  Yes Micheline Chapman, NP  dexamethasone (DECADRON) 4 MG tablet 2 tablets by mouth twice a day the day before, day of and day after the chemotherapy every 3 weeks. 01/20/17  Yes Curt Bears, MD  famotidine (PEPCID) 20 MG tablet Take 1 tablet (20 mg total) by mouth daily. Patient taking differently: Take 20 mg by mouth daily as needed for heartburn.  05/24/16  Yes Rosita Fire, MD  lidocaine-prilocaine (EMLA) cream Apply 1 application topically as needed. Apply 1 tsp over port site 1-1.5 hours prior to chemotherapy. 10/07/16  Yes Curt Bears, MD  loratadine (CLARITIN) 10 MG tablet Take 1 tablet (10 mg total) by mouth daily. 06/03/16  Yes Micheline Chapman, NP  mirtazapine (REMERON) 30 MG tablet Take 0.5 tablets (15 mg total) by mouth at bedtime as needed (for sleep). 09/16/16  Yes Curt Bears, MD  Tiotropium Bromide Monohydrate (SPIRIVA RESPIMAT) 2.5 MCG/ACT AERS Inhale 2 puffs into the lungs daily. 06/02/16  Yes Rigoberto Noel, MD  umeclidinium-vilanterol (ANORO ELLIPTA) 62.5-25 MCG/INH AEPB Inhale 1 puff into the lungs daily. 11/10/16  Yes Rigoberto Noel, MD  VENTOLIN HFA 108 (90 Base) MCG/ACT inhaler INHALE 2 PUFFS BY MOUTH EVERY 6 HOURS AS NEEDED FOR WHEEZE OR SHORTNESS OF BREATH 08/11/16  Yes Rigoberto Noel, MD  acidophilus (RISAQUAD) CAPS capsule Take 1 capsule by mouth daily.    [provider]  aspirin-acetaminophen-caffeine (EXCEDRIN MIGRAINE) [phone] MG per tablet Take 2 tablets by mouth every 6 (six) hours as needed for headache.    [provider]  calcium carbonate (OS-CAL - DOSED IN MG OF ELEMENTAL CALCIUM) 1250 (500 Ca) MG tablet Take 1 tablet by mouth daily with breakfast.    [provider]  cholecalciferol  (VITAMIN D) 1000 units tablet Take 1,000 Units by mouth daily.    [provider]  cyanocobalamin 2000 MCG tablet Take 2,000 mcg by mouth daily.    [provider]  diphenhydrAMINE (BENADRYL) 25 mg capsule Take 25 mg by mouth every 6 (six) hours as needed for itching. Reported on 01/19/2016    [provider]  hydroxypropyl methylcellulose / hypromellose (ISOPTO TEARS / GONIOVISC) 2.5 % ophthalmic solution 1 drop.    [provider]  Lidocaine (ASPERCREME LIDOCAINE) 4 % PTCH Apply 1 patch topically daily as needed. Place on back as needed for pain    [provider]  ondansetron (ZOFRAN) 8 MG tablet Take 1 tablet (8 mg total) by mouth every 8 (eight) hours as needed for nausea or vomiting. 08/29/16   Heath Lark, MD  prochlorperazine (COMPAZINE) 10 MG tablet Take 1 tablet (10 mg total) by mouth every 6 (six) hours as needed for nausea or vomiting. 01/20/17  Curt Bears, MD    Family History Family History  Problem Relation Age of Onset  . COPD Father     Social History Social History  Substance Use Topics  . Smoking status: Former Smoker    Packs/day: 0.25    Years: 44.00    Types: Cigarettes    Quit date: 03/14/2016  . Smokeless tobacco: Never Used  . Alcohol use No     Comment: no alcohol in 7 yrs      Allergies   Ativan [lorazepam] and Cinnamon   Review of Systems Review of Systems 10 Systems reviewed and are negative for acute change except as noted in the HPI.   Physical Exam Updated Vital Signs BP 110/76 (BP Location: Right Arm)   Pulse 82   Temp 98.5 F (36.9 C) (Oral)   Resp 18   Ht 5\' 5"  (1.651 m)   Wt 78 kg (172 lb)   SpO2 99%   BMI 28.62 kg/m   Physical Exam  Constitutional:  Patient appears fatigued, ill and deconditioned.  HENT:  Head: Normocephalic and atraumatic.  Mucous membranes dry. Patient has had complete hair loss.  Neck: Neck supple.  Cardiovascular: Normal rate, regular rhythm, normal heart  sounds and intact distal pulses.   Pulmonary/Chest:  Breath sounds soft. Occasional wet cough. Mild increased work of breathing. Scattered rhonchi.  Abdominal: Soft. She exhibits no distension. There is no tenderness. There is no guarding.  Musculoskeletal: Normal range of motion. She exhibits no edema or tenderness.  Neurological: She is alert. No cranial nerve deficit. She exhibits normal muscle tone. Coordination normal.  Patient sees very generally weak and fatigued. No localizing motor dysfunction  Skin: Skin is warm and dry. There is pallor.     ED Treatments / Results  Labs (all labs ordered are listed, but only abnormal results are displayed) Labs Reviewed  COMPREHENSIVE METABOLIC PANEL - Abnormal; Notable for the following:       Result Value   Potassium 3.2 (*)    Calcium 8.8 (*)    Albumin 3.1 (*)    ALT 8 (*)    All other components within normal limits  CBC WITH DIFFERENTIAL/PLATELET - Abnormal; Notable for the following:    RBC 3.69 (*)    Hemoglobin 11.6 (*)    HCT 34.8 (*)    RDW 18.3 (*)    Monocytes Absolute 1.1 (*)    All other components within normal limits  URINALYSIS, ROUTINE W REFLEX MICROSCOPIC - Abnormal; Notable for the following:    Ketones, ur 20 (*)    All other components within normal limits  I-STAT CG4 LACTIC ACID, ED - Abnormal; Notable for the following:    Lactic Acid, Venous 0.45 (*)    All other components within normal limits  CULTURE, BLOOD (ROUTINE X 2)  CULTURE, BLOOD (ROUTINE X 2)  PROTIME-INR  I-STAT CG4 LACTIC ACID, ED    EKG  EKG Interpretation None       Radiology Dg Chest 2 View  Result Date: 02/05/2017 CLINICAL DATA:  Weakness. Shortness of breath. History lung cancer. EXAM: CHEST  2 VIEW COMPARISON:  05/18/2016 FINDINGS: Lateral view degraded by patient arm position. Vertebral augmentation and upper thoracic levels. A right Port-A-Cath terminates at the low SVC or high right atrium. Patient rotated right on the  frontal. Normal heart size. Atherosclerosis in the transverse aorta. No pleural effusion or pneumothorax. Right greater than left upper lobe opacities with primarily left-sided apical thickening. Diffuse peribronchial thickening. Clear  lower lungs. IMPRESSION: Right greater than left upper lobe and apical opacities. Given history of lung cancer, radiation therapy, and the appearance on prior CT, findings are at least partially favored to be due to scarring. However, especially at the right upper lobe, patchy pneumonia cannot be excluded. Short term radiographic follow-up in 2-3 days should be considered. Peribronchial thickening which may relate to chronic bronchitis or smoking. Aortic Atherosclerosis (ICD10-I70.0). Electronically Signed   By: Abigail Miyamoto M.D.   On: 02/05/2017 12:32   Ct Head Wo Contrast  Result Date: 02/05/2017 CLINICAL DATA:  Confusion, history lung cancer EXAM: CT HEAD WITHOUT CONTRAST TECHNIQUE: Contiguous axial images were obtained from the base of the skull through the vertex without intravenous contrast. Sagittal and coronal MPR images reconstructed from axial data set. COMPARISON:  None FINDINGS: Brain: Normal ventricular morphology. No midline shift or mass effect. Normal appearance of brain parenchyma. No intracranial hemorrhage, mass lesion or evidence acute infarction. No extra-axial fluid collections. Vascular: Minimal atherosclerotic calcification within internal carotid arteries at skullbase Skull: Intact Sinuses/Orbits: Opacification within sphenoid sinus with mild sphenoid sinus osseous wall thickening. Concha bullosa of the middle turbinates. Other: N/A IMPRESSION: No acute intracranial abnormalities. Sphenoid sinus disease. Electronically Signed   By: Lavonia Dana M.D.   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 Persistent left-sided pain. Confusion. EXAM: CT ANGIOGRAPHY  CHEST CT ABDOMEN AND PELVIS WITH CONTRAST TECHNIQUE: Multidetector CT imaging of the chest was performed using the standard protocol during bolus administration of intravenous contrast. Multiplanar CT image reconstructions and MIPs were obtained to evaluate the vascular anatomy. Multidetector CT imaging of the abdomen and pelvis was performed using the standard protocol during bolus administration of intravenous contrast. CONTRAST:  100 cc of Isovue 370 COMPARISON:  Chest radiograph of earlier today.  CTs of 11/14/2016. FINDINGS: CTA CHEST FINDINGS Cardiovascular:  Mild motion degradation The quality of this exam for evaluation of pulmonary embolism is moderate to good. No evidence of pulmonary embolism. Advanced aortic and branch vessel atherosclerosis. Mild cardiomegaly with lipomatous hypertrophy of the interatrial septum. A right Port-A-Cath which terminates in the right atrium. Mediastinum/Nodes: No supraclavicular adenopathy. No mediastinal or hilar adenopathy. Tiny hiatal hernia. Lungs/Pleura: No pleural fluid. Mild left-sided pleural thickening is similar. Moderate centrilobular emphysema. Lower lobe predominant bronchial wall thickening. Spiculated right apical pulmonary nodule measures 1.6 x 1.1 cm on image 22/series 8 and is felt to be similar to 1.7 x 1.5 cm on the prior. This solid component of a subjacent cavitary lesion measures 1.6 x 1.2 cm on image 27/series 8. Compare 1.6 x 1.1 cm on the prior. Right middle lobe ground-glass opacity measures on the order of 2.6 x 2.1 cm today versus 3.3 x 2.3 cm on the prior. Left apical and central upper lobe masslike consolidation. Example at 3.1 x 2.6 cm on image 19/series 8. Similar. Extension toward the left suprahilar left upper lobe measures 2.8 x 1.8 cm on image 28/series 8. Compare 3.0 x 2.2 cm at the same level on the prior. Mild anterior left lower lobe ground-glass opacity may be radiation induced but is new. A 3 mm left lower lobe pulmonary nodule is  unchanged on image 37/series 8. Superior segment left lower lobe septal thickening and patchy airspace disease are new and may also be radiation induced. Musculoskeletal: T3 and T4 moderate and severe compression deformities respectively. Status post vertebral augmentation.  Similar. Accentuation of expected thoracic kyphosis about this level. Review of the MIP images confirms the above findings. CT ABDOMEN and PELVIS FINDINGS Hepatobiliary: Too small to characterize hepatic dome lesion. No suspicious liver lesion. Mild caudate lobe enlargement is nonspecific. The gallbladder is mildly distended. No pericholecystic edema or calcified stone. Upper normal intrahepatic ducts. No common duct dilatation. Pancreas: Mild pancreatic atrophy, without duct dilatation dominant mass. Spleen: Normal in size, without focal abnormality. Adrenals/Urinary Tract: Mild motion artifact within the upper abdomen. Normal adrenal glands. Bilateral too small to characterize renal lesions. 1.5 cm right renal cyst. No hydronephrosis. Stomach/Bowel: Gastric antral underdistention. Colonic stool burden suggests constipation. Normal small bowel. Vascular/Lymphatic: Non aneurysmal dilatation of the infrarenal aorta at 12.5 cm is not significantly changed. No abdominopelvic adenopathy. Reproductive: Hysterectomy.  No adnexal mass. Other: No significant free fluid. Musculoskeletal: Tarlov cysts. Review of the MIP images confirms the above findings. IMPRESSION: 1.  No evidence of pulmonary embolism. 2. Right upper lobe pulmonary nodules and left upper lobe/apical masslike consolidation, similar. A right middle lobe ground-glass opacity is slightly decreased. 3. No thoracic adenopathy. 4. Coronary artery atherosclerosis. Aortic Atherosclerosis (ICD10-I70.0). 5. Similar T3 and T4 compression deformities, status post vertebral augmentation. 6. No acute process or evidence of metastatic disease in the abdomen or pelvis. 7. Gallbladder distension which is  nonspecific. Correlate with right upper quadrant symptoms and possibly right upper quadrant ultrasound. 8.  Tiny hiatal hernia. 9. Areas of superior segment left lower lobe and left lower lobe mild ground-glass and airspace opacity are nonspecific but could be radiation induced. Given they are new since the prior exam, areas of infection cannot be excluded. Electronically Signed   By: Abigail Miyamoto M.D.   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 Persistent left-sided pain. Confusion. EXAM: CT ANGIOGRAPHY CHEST CT ABDOMEN AND PELVIS WITH CONTRAST TECHNIQUE: Multidetector CT imaging of the chest was performed using the standard protocol during bolus administration of intravenous contrast. Multiplanar CT image reconstructions and MIPs were obtained to evaluate the vascular anatomy. Multidetector CT imaging of the abdomen and pelvis was performed using the standard protocol during bolus administration of intravenous contrast. CONTRAST:  100 cc of Isovue 370 COMPARISON:  Chest radiograph of earlier today.  CTs of 11/14/2016. FINDINGS: CTA CHEST FINDINGS Cardiovascular:  Mild motion degradation The quality of this exam for evaluation of pulmonary embolism is moderate to good. No evidence of pulmonary embolism. Advanced aortic and branch vessel atherosclerosis. Mild cardiomegaly with lipomatous hypertrophy of the interatrial septum. A right Port-A-Cath which terminates in the right atrium. Mediastinum/Nodes: No supraclavicular adenopathy. No mediastinal or hilar adenopathy. Tiny hiatal hernia. Lungs/Pleura: No pleural fluid. Mild left-sided pleural thickening is similar. Moderate centrilobular emphysema. Lower lobe predominant bronchial wall thickening. Spiculated right apical pulmonary nodule measures 1.6 x 1.1 cm on image 22/series 8 and is felt to be similar to 1.7 x 1.5 cm on the prior. This solid component of a  subjacent cavitary lesion measures 1.6 x 1.2 cm on image 27/series 8. Compare 1.6 x 1.1 cm on the prior. Right middle lobe ground-glass opacity measures on the order of 2.6 x 2.1 cm today versus 3.3 x 2.3 cm on the prior. Left apical and central upper lobe masslike consolidation. Example at 3.1 x 2.6 cm on image 19/series 8. Similar. Extension toward the left suprahilar left upper lobe measures 2.8 x 1.8 cm on image 28/series 8. Compare  3.0 x 2.2 cm at the same level on the prior. Mild anterior left lower lobe ground-glass opacity may be radiation induced but is new. A 3 mm left lower lobe pulmonary nodule is unchanged on image 37/series 8. Superior segment left lower lobe septal thickening and patchy airspace disease are new and may also be radiation induced. Musculoskeletal: T3 and T4 moderate and severe compression deformities respectively. Status post vertebral augmentation. Similar. Accentuation of expected thoracic kyphosis about this level. Review of the MIP images confirms the above findings. CT ABDOMEN and PELVIS FINDINGS Hepatobiliary: Too small to characterize hepatic dome lesion. No suspicious liver lesion. Mild caudate lobe enlargement is nonspecific. The gallbladder is mildly distended. No pericholecystic edema or calcified stone. Upper normal intrahepatic ducts. No common duct dilatation. Pancreas: Mild pancreatic atrophy, without duct dilatation dominant mass. Spleen: Normal in size, without focal abnormality. Adrenals/Urinary Tract: Mild motion artifact within the upper abdomen. Normal adrenal glands. Bilateral too small to characterize renal lesions. 1.5 cm right renal cyst. No hydronephrosis. Stomach/Bowel: Gastric antral underdistention. Colonic stool burden suggests constipation. Normal small bowel. Vascular/Lymphatic: Non aneurysmal dilatation of the infrarenal aorta at 12.5 cm is not significantly changed. No abdominopelvic adenopathy. Reproductive: Hysterectomy.  No adnexal mass. Other: No  significant free fluid. Musculoskeletal: Tarlov cysts. Review of the MIP images confirms the above findings. IMPRESSION: 1.  No evidence of pulmonary embolism. 2. Right upper lobe pulmonary nodules and left upper lobe/apical masslike consolidation, similar. A right middle lobe ground-glass opacity is slightly decreased. 3. No thoracic adenopathy. 4. Coronary artery atherosclerosis. Aortic Atherosclerosis (ICD10-I70.0). 5. Similar T3 and T4 compression deformities, status post vertebral augmentation. 6. No acute process or evidence of metastatic disease in the abdomen or pelvis. 7. Gallbladder distension which is nonspecific. Correlate with right upper quadrant symptoms and possibly right upper quadrant ultrasound. 8.  Tiny hiatal hernia. 9. Areas of superior segment left lower lobe and left lower lobe mild ground-glass and airspace opacity are nonspecific but could be radiation induced. Given they are new since the prior exam, areas of infection cannot be excluded. Electronically Signed   By: Abigail Miyamoto M.D.   On: 02/05/2017 14:24    Procedures Procedures (including critical care time) CRITICAL CARE Performed by: Charlesetta Shanks   Total critical care time:30  minutes  Critical care time was exclusive of separately billable procedures and treating other patients.  Critical care was necessary to treat or prevent imminent or life-threatening deterioration.  Critical care was time spent personally by me on the following activities: development of treatment plan with patient and/or surrogate as well as nursing, discussions with consultants, evaluation of patient's response to treatment, examination of patient, obtaining history from patient or surrogate, ordering and performing treatments and interventions, ordering and review of laboratory studies, ordering and review of radiographic studies, pulse oximetry and re-evaluation of patient's condition. Medications Ordered in ED Medications  ceFEPIme  (MAXIPIME) 1 g in dextrose 5 % 50 mL IVPB (not administered)  vancomycin (VANCOCIN) IVPB 1000 mg/200 mL premix (not administered)  morphine 4 MG/ML injection 4 mg (4 mg Intravenous Given 02/05/17 1311)  0.9 %  sodium chloride infusion ( Intravenous New Bag/Given 02/05/17 1309)  iopamidol (ISOVUE-370) 76 % injection 100 mL (100 mLs Intravenous Contrast Given 02/05/17 1325)     Initial Impression / Assessment and Plan / ED Course  I have reviewed the triage vital signs and the nursing notes.  Pertinent labs & imaging results that were available during my care of the patient were reviewed  by me and considered in my medical decision making (see chart for details).     Final Clinical Impressions(s) / ED Diagnoses   Final diagnoses:  HCAP (healthcare-associated pneumonia)  Hypoxia  Malignant neoplasm of lung, unspecified laterality, unspecified part of lung ([place])  Confusion   Patient has known adenocarcinoma of the lung. Worsening symptoms have developed of cough, hypoxia responding supplemental O2 and confusion. CT scan shows area suspicious for pneumonia. This is consistent with patient's symptoms and decline. Patient does undergo chemotherapy. We will initiate HCAP Treatment and plan for admission. New Prescriptions New Prescriptions   No medications on file     Charlesetta Shanks, MD 02/05/17 1501

## 2017-02-05 NOTE — Progress Notes (Signed)
Pharmacy Antibiotic Note  Heather Banks is a 66 y.o. female admitted on 02/05/2017 with pneumonia.  PMH significant for COPD and Lung CA- last chemo given 6/26.  Pharmacy has been consulted for Vancomycin & Cefepime dosing. She received initial doses in ED.    02/05/2017:   Afebrile (reported fevers PTA by family)  Chest CT + PNA  WBC, LA WNL  Renal function at patient's baseline.  Estimated CrCl ~70-2ml/min.  Plan: Vancomycin 1g IV every 12 hours.  Goal trough 15-20 mcg/mL. Cefepime 2gm IV q12h  Check Vancomycin trough at steady state Monitor renal function and cx data   Height: 5\' 5"  (165.1 cm) Weight: 169 lb 1.5 oz (76.7 kg) IBW/kg (Calculated) : 57  Temp (24hrs), Avg:98.1 F (36.7 C), Min:97.7 F (36.5 C), Max:98.5 F (36.9 C)   Recent Labs Lab 02/03/17 0920 02/03/17 0920 02/05/17 1137 02/05/17 1149 02/05/17 1433  WBC 10.1  --  8.1  --   --   CREATININE  --  0.6 0.46  --   --   LATICACIDVEN  --   --   --  0.87 0.45*    Estimated Creatinine Clearance: 73.7 mL/min (by C-G formula based on SCr of 0.46 mg/dL).    Allergies  Allergen Reactions  . Ativan [Lorazepam] Other (See Comments)    Reaction:  Makes pt angry - opposite reaction of med should do  . Cinnamon Other (See Comments)    Seasonal scent trigger migraine as well as pt's overall immune system    Antimicrobials this admission: 7/12 Vanc >>  7/12 Cefepime >>   Dose adjustments this admission:  Microbiology results: 7/12 BCx:  7/12 MRSA PCR:   Thank you for allowing pharmacy to be a part of this patient's care.  Biagio Borg 02/05/2017 7:28 PM

## 2017-02-06 ENCOUNTER — Ambulatory Visit (HOSPITAL_COMMUNITY): Payer: Medicare Other

## 2017-02-06 DIAGNOSIS — G9341 Metabolic encephalopathy: Secondary | ICD-10-CM

## 2017-02-06 DIAGNOSIS — J9601 Acute respiratory failure with hypoxia: Secondary | ICD-10-CM

## 2017-02-06 DIAGNOSIS — J441 Chronic obstructive pulmonary disease with (acute) exacerbation: Secondary | ICD-10-CM

## 2017-02-06 DIAGNOSIS — E876 Hypokalemia: Secondary | ICD-10-CM

## 2017-02-06 DIAGNOSIS — J189 Pneumonia, unspecified organism: Principal | ICD-10-CM

## 2017-02-06 LAB — COMPREHENSIVE METABOLIC PANEL
ALBUMIN: 2.9 g/dL — AB (ref 3.5–5.0)
ALK PHOS: 92 U/L (ref 38–126)
ALT: 8 U/L — ABNORMAL LOW (ref 14–54)
ANION GAP: 12 (ref 5–15)
AST: 12 U/L — ABNORMAL LOW (ref 15–41)
BILIRUBIN TOTAL: 0.8 mg/dL (ref 0.3–1.2)
BUN: 6 mg/dL (ref 6–20)
CALCIUM: 8.2 mg/dL — AB (ref 8.9–10.3)
CO2: 24 mmol/L (ref 22–32)
Chloride: 106 mmol/L (ref 101–111)
Creatinine, Ser: 0.38 mg/dL — ABNORMAL LOW (ref 0.44–1.00)
Glucose, Bld: 84 mg/dL (ref 65–99)
POTASSIUM: 2.9 mmol/L — AB (ref 3.5–5.1)
Sodium: 142 mmol/L (ref 135–145)
TOTAL PROTEIN: 6.1 g/dL — AB (ref 6.5–8.1)

## 2017-02-06 LAB — BLOOD CULTURE ID PANEL (REFLEXED)
ACINETOBACTER BAUMANNII: NOT DETECTED
CANDIDA ALBICANS: NOT DETECTED
CANDIDA PARAPSILOSIS: NOT DETECTED
CANDIDA TROPICALIS: NOT DETECTED
Candida glabrata: NOT DETECTED
Candida krusei: NOT DETECTED
ENTEROBACTERIACEAE SPECIES: NOT DETECTED
ENTEROCOCCUS SPECIES: NOT DETECTED
Enterobacter cloacae complex: NOT DETECTED
Escherichia coli: NOT DETECTED
HAEMOPHILUS INFLUENZAE: NOT DETECTED
Klebsiella oxytoca: NOT DETECTED
Klebsiella pneumoniae: NOT DETECTED
Listeria monocytogenes: NOT DETECTED
METHICILLIN RESISTANCE: DETECTED — AB
NEISSERIA MENINGITIDIS: NOT DETECTED
PSEUDOMONAS AERUGINOSA: NOT DETECTED
Proteus species: NOT DETECTED
SERRATIA MARCESCENS: NOT DETECTED
STAPHYLOCOCCUS AUREUS BCID: NOT DETECTED
STREPTOCOCCUS PNEUMONIAE: NOT DETECTED
STREPTOCOCCUS PYOGENES: NOT DETECTED
STREPTOCOCCUS SPECIES: NOT DETECTED
Staphylococcus species: DETECTED — AB
Streptococcus agalactiae: NOT DETECTED

## 2017-02-06 LAB — CBC
HEMATOCRIT: 32.4 % — AB (ref 36.0–46.0)
HEMOGLOBIN: 10.6 g/dL — AB (ref 12.0–15.0)
MCH: 31.3 pg (ref 26.0–34.0)
MCHC: 32.7 g/dL (ref 30.0–36.0)
MCV: 95.6 fL (ref 78.0–100.0)
Platelets: 350 10*3/uL (ref 150–400)
RBC: 3.39 MIL/uL — ABNORMAL LOW (ref 3.87–5.11)
RDW: 18.3 % — AB (ref 11.5–15.5)
WBC: 9.4 10*3/uL (ref 4.0–10.5)

## 2017-02-06 LAB — HIV ANTIBODY (ROUTINE TESTING W REFLEX): HIV SCREEN 4TH GENERATION: NONREACTIVE

## 2017-02-06 LAB — MAGNESIUM
MAGNESIUM: 1.8 mg/dL (ref 1.7–2.4)
Magnesium: 1.8 mg/dL (ref 1.7–2.4)

## 2017-02-06 LAB — POTASSIUM: Potassium: 2.8 mmol/L — ABNORMAL LOW (ref 3.5–5.1)

## 2017-02-06 MED ORDER — IPRATROPIUM-ALBUTEROL 0.5-2.5 (3) MG/3ML IN SOLN
3.0000 mL | Freq: Three times a day (TID) | RESPIRATORY_TRACT | Status: DC
  Administered 2017-02-06 – 2017-02-08 (×7): 3 mL via RESPIRATORY_TRACT
  Filled 2017-02-06 (×7): qty 3

## 2017-02-06 MED ORDER — POTASSIUM CHLORIDE 10 MEQ/100ML IV SOLN
10.0000 meq | INTRAVENOUS | Status: AC
  Administered 2017-02-06 (×4): 10 meq via INTRAVENOUS
  Filled 2017-02-06 (×4): qty 100

## 2017-02-06 NOTE — Progress Notes (Signed)
I attempted to clarify the meds she is taking at home. I spoke with her daughter Rema Jasmine but she was unable to verify exactly which meds the patient is taking or how she is taking them.   The patient remains lethargic and unable to verify her medications.  Romeo Rabon, PharmD, pager [phone]. 02/06/2017,2:39 PM.

## 2017-02-06 NOTE — Progress Notes (Signed)
PROGRESS NOTE [facility]   Heather Banks   SPQ:[id number] DOB: [birth year]  DOA: 02/05/2017 PCP: Scot Jun, FNP   Brief Narrative:  Heather Banks is a 66 y.o. female with medical history significant of COPD, Stage 4 lung cancer on chemotherpay followed by Dr.Mohamed, Anxiety, depression was brought to the ED by her family with complaints of cough-productive of phlegm, dyspnea, confusion/lethargy and desaturation to the 80's for 1 day PTA. Patient was found to have pneumonia on CT findings and was admitted for further treatment.   Subjective: Patient seen and examined, she is sleeping but responsive to verbal stimuli. Report doing ok, although easily fall a sleep. No chest pain or difficulty breathing.   Assessment & Plan: Acute resp failure with hypoxia - due to pneumonia  Blood culture grew 1/2 bottles staph species - likely contaminant  MRSA negative  Procalcitonin would not be indicated as patient on active chemo  Will continue to monitor clinically  Can d/c vanco, continue cefepime for now and follow up clinical course  Wean O2 as tolerated   Acute metabolic encephalopathy  Due to infectious process  More awake and following commands  Will obtain swallow eval prior to order oral intake  Hopefully improve with improvement of infectious process, if no sig improvement in AM will get another head images.  See above   Stage 4 lung CA  Stable  On active chemo with Dr Julien Nordmann   COPD  Doesn't seem on exacerbation at this point.  ABG with no CO2 retention  Continue neb and O2 as needed   VTach  Due to hypokalemia  See below  Continue tele  Hypokalemia  Replete  Check K and Mg now and in the AM   DVT prophylaxis: Heparin sq  Code Status: FULL code  Family Communication: None at bedside  Disposition Plan: Keep in SDU for now   Consultants:   Dr. Julien Nordmann   Procedures:   None   Antimicrobials: Anti-infectives    Start     Dose/Rate Route Frequency  Ordered Stop   02/06/17 0300  vancomycin (VANCOCIN) IVPB 1000 mg/200 mL premix     1,000 mg 200 mL/hr over 60 Minutes Intravenous Every 12 hours 02/05/17 1943     02/06/17 0000  ceFEPIme (MAXIPIME) 2 g in dextrose 5 % 50 mL IVPB     2 g 100 mL/hr over 30 Minutes Intravenous Every 12 hours 02/05/17 1943     02/05/17 1500  ceFEPIme (MAXIPIME) 1 g in dextrose 5 % 50 mL IVPB     1 g 100 mL/hr over 30 Minutes Intravenous  Once 02/05/17 1449 02/05/17 1545   02/05/17 1500  vancomycin (VANCOCIN) IVPB 1000 mg/200 mL premix     1,000 mg 200 mL/hr over 60 Minutes Intravenous  Once 02/05/17 1449 02/05/17 1744       Objective: Vitals:   02/06/17 0355 02/06/17 0400 02/06/17 0800 02/06/17 0810  BP:  (!) 108/52 (!) 147/62   Pulse:  87 85   Resp:  19 (!) 30   Temp: 98.2 F (36.8 C)  98.7 F (37.1 C)   TempSrc: Oral  Axillary   SpO2:  98% 100% 100%  Weight:      Height:        Intake/Output Summary (Last 24 hours) at 02/06/17 0939 Last data filed at 02/06/17 0800  Gross per 24 hour  Intake          1246.67 ml  Output  950 ml  Net           296.67 ml   Filed Weights   02/05/17 1122 02/05/17 1850  Weight: 78 kg (172 lb) 76.7 kg (169 lb 1.5 oz)    Examination:  General exam: Very sleepy but responsive to verbal stimuli  HEENT: AC/AT, OP moist and clear Respiratory system: BS diminished at the bases, no crackles or wheezing  Cardiovascular system: S1 & S2 heard, RRR. 2/6 SM  Gastrointestinal system: Abdomen is nondistended, soft and nontender. Normal bowel sounds. Central nervous system: Oriented to person and place, but not time. Follow simple commands, no focal deficit noted  Extremities: No LE edema.  Skin: No rashes, lesions or ulcers Psychiatry: Unable to perform   Data Reviewed: I have personally reviewed following labs and imaging studies  CBC:  Recent Labs Lab 02/03/17 0920 02/05/17 1137 02/05/17 2027 02/06/17 0329  WBC 10.1 8.1 12.1* 9.4  NEUTROABS  8.3* 5.9  --   --   HGB 12.2 11.6* 11.1* 10.6*  HCT 36.6 34.8* 34.1* 32.4*  MCV 95.0 94.3 96.1 95.6  PLT 298 392 387 235   Basic Metabolic Panel:  Recent Labs Lab 02/03/17 0920 02/05/17 1137 02/05/17 2027 02/06/17 0329  NA 141 142  --  142  K 4.0 3.2*  --  2.9*  CL  --  105  --  106  CO2 26 23  --  24  GLUCOSE 107 85  --  84  BUN 7.1 9  --  6  CREATININE 0.6 0.46 0.47 0.38*  CALCIUM 9.7 8.8*  --  8.2*  MG  --   --   --  1.8   GFR: Estimated Creatinine Clearance: 73.7 mL/min (A) (by C-G formula based on SCr of 0.38 mg/dL (L)). Liver Function Tests:  Recent Labs Lab 02/03/17 0920 02/05/17 1137 02/06/17 0329  AST 9 16 12*  ALT <6 8* 8*  ALKPHOS 142 115 92  BILITOT <0.22 0.3 0.8  PROT 6.8 6.6 6.1*  ALBUMIN 2.8* 3.1* 2.9*   No results for input(s): LIPASE, AMYLASE in the last 168 hours. No results for input(s): AMMONIA in the last 168 hours. Coagulation Profile:  Recent Labs Lab 02/05/17 1137  INR 1.19   Cardiac Enzymes: No results for input(s): CKTOTAL, CKMB, CKMBINDEX, TROPONINI in the last 168 hours. BNP (last 3 results) No results for input(s): PROBNP in the last 8760 hours. HbA1C: No results for input(s): HGBA1C in the last 72 hours. CBG: No results for input(s): GLUCAP in the last 168 hours. Lipid Profile: No results for input(s): CHOL, HDL, LDLCALC, TRIG, CHOLHDL, LDLDIRECT in the last 72 hours. Thyroid Function Tests: No results for input(s): TSH, T4TOTAL, FREET4, T3FREE, THYROIDAB in the last 72 hours. Anemia Panel: No results for input(s): VITAMINB12, FOLATE, FERRITIN, TIBC, IRON, RETICCTPCT in the last 72 hours. Sepsis Labs:  Recent Labs Lab 02/05/17 1149 02/05/17 1433  LATICACIDVEN 0.87 0.45*    Recent Results (from the past 240 hour(s))  Culture, blood (Routine x 2)     Status: None (Preliminary result)   Collection Time: 02/05/17 11:51 AM  Result Value Ref Range Status   Specimen Description BLOOD BLOOD RIGHT HAND  Final   Special  Requests   Final    BOTTLES DRAWN AEROBIC AND ANAEROBIC Blood Culture adequate volume   Culture  Setup Time   Final    GRAM POSITIVE COCCI IN CLUSTERS AEROBIC BOTTLE ONLY Organism ID to follow Performed at [facility], [place]  [address] [place], [place] [address]    Culture GRAM POSITIVE COCCI IN CLUSTERS  Final   Report Status PENDING  Incomplete  MRSA PCR Screening     Status: None   Collection Time: 02/05/17  6:52 PM  Result Value Ref Range Status   MRSA by PCR NEGATIVE NEGATIVE Final    Comment:        The GeneXpert MRSA Assay (FDA approved for NASAL specimens only), is one component of a comprehensive MRSA colonization surveillance program. It is not intended to diagnose MRSA infection nor to guide or monitor treatment for MRSA infections.      Radiology Studies: Dg Chest 2 View  Result Date: 02/05/2017 CLINICAL DATA:  Weakness. Shortness of breath. History lung cancer. EXAM: CHEST  2 VIEW COMPARISON:  05/18/2016 FINDINGS: Lateral view degraded by patient arm position. Vertebral augmentation and upper thoracic levels. A right Port-A-Cath terminates at the low SVC or high right atrium. Patient rotated right on the frontal. Normal heart size. Atherosclerosis in the transverse aorta. No pleural effusion or pneumothorax. Right greater than left upper lobe opacities with primarily left-sided apical thickening. Diffuse peribronchial thickening. Clear lower lungs. IMPRESSION: Right greater than left upper lobe and apical opacities. Given history of lung cancer, radiation therapy, and the appearance on prior CT, findings are at least partially favored to be due to scarring. However, especially at the right upper lobe, patchy pneumonia cannot be excluded. Short term radiographic follow-up in 2-3 days should be considered. Peribronchial thickening which may relate to chronic bronchitis or smoking. Aortic Atherosclerosis (ICD10-I70.0). Electronically Signed   By: Abigail Miyamoto M.D.   On:  02/05/2017 12:32   Ct Head Wo Contrast  Result Date: 02/05/2017 CLINICAL DATA:  Confusion, history lung cancer EXAM: CT HEAD WITHOUT CONTRAST TECHNIQUE: Contiguous axial images were obtained from the base of the skull through the vertex without intravenous contrast. Sagittal and coronal MPR images reconstructed from axial data set. COMPARISON:  None FINDINGS: Brain: Normal ventricular morphology. No midline shift or mass effect. Normal appearance of brain parenchyma. No intracranial hemorrhage, mass lesion or evidence acute infarction. No extra-axial fluid collections. Vascular: Minimal atherosclerotic calcification within internal carotid arteries at skullbase Skull: Intact Sinuses/Orbits: Opacification within sphenoid sinus with mild sphenoid sinus osseous wall thickening. Concha bullosa of the middle turbinates. Other: N/A IMPRESSION: No acute intracranial abnormalities. Sphenoid sinus disease. Electronically Signed   By: Lavonia Dana M.D.   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 Persistent left-sided pain. Confusion. EXAM: CT ANGIOGRAPHY CHEST CT ABDOMEN AND PELVIS WITH CONTRAST TECHNIQUE: Multidetector CT imaging of the chest was performed using the standard protocol during bolus administration of intravenous contrast. Multiplanar CT image reconstructions and MIPs were obtained to evaluate the vascular anatomy. Multidetector CT imaging of the abdomen and pelvis was performed using the standard protocol during bolus administration of intravenous contrast. CONTRAST:  100 cc of Isovue 370 COMPARISON:  Chest radiograph of earlier today.  CTs of 11/14/2016. FINDINGS: CTA CHEST FINDINGS Cardiovascular:  Mild motion degradation The quality of this exam for evaluation of pulmonary embolism is moderate to good. No evidence of pulmonary embolism. Advanced aortic and branch vessel atherosclerosis. Mild  cardiomegaly with lipomatous hypertrophy of the interatrial septum. A right Port-A-Cath which terminates in the right atrium. Mediastinum/Nodes: No supraclavicular adenopathy. No mediastinal or hilar adenopathy. Tiny hiatal hernia. Lungs/Pleura: No pleural fluid. Mild left-sided pleural  thickening is similar. Moderate centrilobular emphysema. Lower lobe predominant bronchial wall thickening. Spiculated right apical pulmonary nodule measures 1.6 x 1.1 cm on image 22/series 8 and is felt to be similar to 1.7 x 1.5 cm on the prior. This solid component of a subjacent cavitary lesion measures 1.6 x 1.2 cm on image 27/series 8. Compare 1.6 x 1.1 cm on the prior. Right middle lobe ground-glass opacity measures on the order of 2.6 x 2.1 cm today versus 3.3 x 2.3 cm on the prior. Left apical and central upper lobe masslike consolidation. Example at 3.1 x 2.6 cm on image 19/series 8. Similar. Extension toward the left suprahilar left upper lobe measures 2.8 x 1.8 cm on image 28/series 8. Compare 3.0 x 2.2 cm at the same level on the prior. Mild anterior left lower lobe ground-glass opacity may be radiation induced but is new. A 3 mm left lower lobe pulmonary nodule is unchanged on image 37/series 8. Superior segment left lower lobe septal thickening and patchy airspace disease are new and may also be radiation induced. Musculoskeletal: T3 and T4 moderate and severe compression deformities respectively. Status post vertebral augmentation. Similar. Accentuation of expected thoracic kyphosis about this level. Review of the MIP images confirms the above findings. CT ABDOMEN and PELVIS FINDINGS Hepatobiliary: Too small to characterize hepatic dome lesion. No suspicious liver lesion. Mild caudate lobe enlargement is nonspecific. The gallbladder is mildly distended. No pericholecystic edema or calcified stone. Upper normal intrahepatic ducts. No common duct dilatation. Pancreas: Mild pancreatic atrophy, without duct dilatation  dominant mass. Spleen: Normal in size, without focal abnormality. Adrenals/Urinary Tract: Mild motion artifact within the upper abdomen. Normal adrenal glands. Bilateral too small to characterize renal lesions. 1.5 cm right renal cyst. No hydronephrosis. Stomach/Bowel: Gastric antral underdistention. Colonic stool burden suggests constipation. Normal small bowel. Vascular/Lymphatic: Non aneurysmal dilatation of the infrarenal aorta at 12.5 cm is not significantly changed. No abdominopelvic adenopathy. Reproductive: Hysterectomy.  No adnexal mass. Other: No significant free fluid. Musculoskeletal: Tarlov cysts. Review of the MIP images confirms the above findings. IMPRESSION: 1.  No evidence of pulmonary embolism. 2. Right upper lobe pulmonary nodules and left upper lobe/apical masslike consolidation, similar. A right middle lobe ground-glass opacity is slightly decreased. 3. No thoracic adenopathy. 4. Coronary artery atherosclerosis. Aortic Atherosclerosis (ICD10-I70.0). 5. Similar T3 and T4 compression deformities, status post vertebral augmentation. 6. No acute process or evidence of metastatic disease in the abdomen or pelvis. 7. Gallbladder distension which is nonspecific. Correlate with right upper quadrant symptoms and possibly right upper quadrant ultrasound. 8.  Tiny hiatal hernia. 9. Areas of superior segment left lower lobe and left lower lobe mild ground-glass and airspace opacity are nonspecific but could be radiation induced. Given they are new since the prior exam, areas of infection cannot be excluded. Electronically Signed   By: Abigail Miyamoto M.D.   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 Persistent left-sided pain. Confusion. EXAM: CT ANGIOGRAPHY CHEST CT ABDOMEN AND PELVIS WITH CONTRAST TECHNIQUE: Multidetector CT imaging of the chest was performed using the standard protocol during bolus  administration of intravenous contrast. Multiplanar CT image reconstructions and MIPs were obtained to evaluate the vascular anatomy. Multidetector CT imaging of the abdomen and pelvis was performed using the standard protocol during bolus administration of intravenous contrast. CONTRAST:  100 cc of Isovue 370 COMPARISON:  Chest radiograph of earlier today.  CTs of  11/14/2016. FINDINGS: CTA CHEST FINDINGS Cardiovascular:  Mild motion degradation The quality of this exam for evaluation of pulmonary embolism is moderate to good. No evidence of pulmonary embolism. Advanced aortic and branch vessel atherosclerosis. Mild cardiomegaly with lipomatous hypertrophy of the interatrial septum. A right Port-A-Cath which terminates in the right atrium. Mediastinum/Nodes: No supraclavicular adenopathy. No mediastinal or hilar adenopathy. Tiny hiatal hernia. Lungs/Pleura: No pleural fluid. Mild left-sided pleural thickening is similar. Moderate centrilobular emphysema. Lower lobe predominant bronchial wall thickening. Spiculated right apical pulmonary nodule measures 1.6 x 1.1 cm on image 22/series 8 and is felt to be similar to 1.7 x 1.5 cm on the prior. This solid component of a subjacent cavitary lesion measures 1.6 x 1.2 cm on image 27/series 8. Compare 1.6 x 1.1 cm on the prior. Right middle lobe ground-glass opacity measures on the order of 2.6 x 2.1 cm today versus 3.3 x 2.3 cm on the prior. Left apical and central upper lobe masslike consolidation. Example at 3.1 x 2.6 cm on image 19/series 8. Similar. Extension toward the left suprahilar left upper lobe measures 2.8 x 1.8 cm on image 28/series 8. Compare 3.0 x 2.2 cm at the same level on the prior. Mild anterior left lower lobe ground-glass opacity may be radiation induced but is new. A 3 mm left lower lobe pulmonary nodule is unchanged on image 37/series 8. Superior segment left lower lobe septal thickening and patchy airspace disease are new and may also be radiation  induced. Musculoskeletal: T3 and T4 moderate and severe compression deformities respectively. Status post vertebral augmentation. Similar. Accentuation of expected thoracic kyphosis about this level. Review of the MIP images confirms the above findings. CT ABDOMEN and PELVIS FINDINGS Hepatobiliary: Too small to characterize hepatic dome lesion. No suspicious liver lesion. Mild caudate lobe enlargement is nonspecific. The gallbladder is mildly distended. No pericholecystic edema or calcified stone. Upper normal intrahepatic ducts. No common duct dilatation. Pancreas: Mild pancreatic atrophy, without duct dilatation dominant mass. Spleen: Normal in size, without focal abnormality. Adrenals/Urinary Tract: Mild motion artifact within the upper abdomen. Normal adrenal glands. Bilateral too small to characterize renal lesions. 1.5 cm right renal cyst. No hydronephrosis. Stomach/Bowel: Gastric antral underdistention. Colonic stool burden suggests constipation. Normal small bowel. Vascular/Lymphatic: Non aneurysmal dilatation of the infrarenal aorta at 12.5 cm is not significantly changed. No abdominopelvic adenopathy. Reproductive: Hysterectomy.  No adnexal mass. Other: No significant free fluid. Musculoskeletal: Tarlov cysts. Review of the MIP images confirms the above findings. IMPRESSION: 1.  No evidence of pulmonary embolism. 2. Right upper lobe pulmonary nodules and left upper lobe/apical masslike consolidation, similar. A right middle lobe ground-glass opacity is slightly decreased. 3. No thoracic adenopathy. 4. Coronary artery atherosclerosis. Aortic Atherosclerosis (ICD10-I70.0). 5. Similar T3 and T4 compression deformities, status post vertebral augmentation. 6. No acute process or evidence of metastatic disease in the abdomen or pelvis. 7. Gallbladder distension which is nonspecific. Correlate with right upper quadrant symptoms and possibly right upper quadrant ultrasound. 8.  Tiny hiatal hernia. 9. Areas of  superior segment left lower lobe and left lower lobe mild ground-glass and airspace opacity are nonspecific but could be radiation induced. Given they are new since the prior exam, areas of infection cannot be excluded. Electronically Signed   By: Abigail Miyamoto M.D.   On: 02/05/2017 14:24     Scheduled Meds: . heparin  5,000 Units Subcutaneous Q8H  . ipratropium-albuterol  3 mL Nebulization TID  . loratadine  10 mg Oral Daily   Continuous Infusions: .  sodium chloride 100 mL/hr at 02/06/17 0800  . ceFEPime (MAXIPIME) IV Stopped (02/06/17 0015)  . potassium chloride 10 mEq (02/06/17 0811)  . vancomycin Stopped (02/06/17 0628)     LOS: 1 day    Time spent: Total of 35 minutes spent with pt, greater than 50% of which was spent in discussion of  treatment, counseling and coordination of care    Chipper Oman, MD Pager: Text Page via [email or web address]  [phone]  If 7PM-7AM, please contact night-coverage [email or web address] Password [facility] 02/06/2017, 9:39 AM

## 2017-02-06 NOTE — Progress Notes (Addendum)
CRITICAL VALUE ALERT  Critical Value: K+ 2.9  Date & Time Notied:  02-06-17 - not notified by lab  Provider Notified: Baltazar Najjar  Orders Received/Actions taken: New orders for magnesium lab and 4 runs of potassium.

## 2017-02-06 NOTE — Progress Notes (Signed)
PHARMACY - PHYSICIAN COMMUNICATION CRITICAL VALUE ALERT - BLOOD CULTURE IDENTIFICATION (BCID)  Results for orders placed or performed during the hospital encounter of 02/05/17  Blood Culture ID Panel (Reflexed) (Collected: 02/05/2017 11:51 AM)  Result Value Ref Range   Enterococcus species NOT DETECTED NOT DETECTED   Listeria monocytogenes NOT DETECTED NOT DETECTED   Staphylococcus species DETECTED (A) NOT DETECTED   Staphylococcus aureus NOT DETECTED NOT DETECTED   Methicillin resistance DETECTED (A) NOT DETECTED   Streptococcus species NOT DETECTED NOT DETECTED   Streptococcus agalactiae NOT DETECTED NOT DETECTED   Streptococcus pneumoniae NOT DETECTED NOT DETECTED   Streptococcus pyogenes NOT DETECTED NOT DETECTED   Acinetobacter baumannii NOT DETECTED NOT DETECTED   Enterobacteriaceae species NOT DETECTED NOT DETECTED   Enterobacter cloacae complex NOT DETECTED NOT DETECTED   Escherichia coli NOT DETECTED NOT DETECTED   Klebsiella oxytoca NOT DETECTED NOT DETECTED   Klebsiella pneumoniae NOT DETECTED NOT DETECTED   Proteus species NOT DETECTED NOT DETECTED   Serratia marcescens NOT DETECTED NOT DETECTED   Haemophilus influenzae NOT DETECTED NOT DETECTED   Neisseria meningitidis NOT DETECTED NOT DETECTED   Pseudomonas aeruginosa NOT DETECTED NOT DETECTED   Candida albicans NOT DETECTED NOT DETECTED   Candida glabrata NOT DETECTED NOT DETECTED   Candida krusei NOT DETECTED NOT DETECTED   Candida parapsilosis NOT DETECTED NOT DETECTED   Candida tropicalis NOT DETECTED NOT DETECTED    Name of physician (or Provider) Contacted: Dr. Quincy Simmonds  Changes to prescribed antibiotics required: none  Hershal Coria 02/06/2017  11:15 AM

## 2017-02-06 NOTE — Progress Notes (Signed)
Initial Nutrition Assessment  DOCUMENTATION CODES:   Not applicable  INTERVENTION:  - Diet advancement as medically feasible. - RD will order appropriate oral nutrition supplements and/or snacks at follow-up.  NUTRITION DIAGNOSIS:   Inadequate oral intake related to inability to eat as evidenced by NPO status.  GOAL:   Patient will meet greater than or equal to 90% of their needs  MONITOR:   Diet advancement, Weight trends, Labs  REASON FOR ASSESSMENT:   Malnutrition Screening Tool  ASSESSMENT:   66 y.o. female with medical history significant of COPD, Stage 4 lung cancer on chemotherpay followed by Dr.Mohamed, Anxiety, depression was brought to the ED by her family with complaints of cough-productive of phlegm, dyspnea, confusion/lethargy since yesterday, they also noticed that her O2 sats were in the 80s this morning as well.  Pt seen for MST. BMI indicates overweight status. She has been NPO since admission. Pt sleeping at time of visit and awoke to name call but would quickly fall back to sleep. She nodded head "no" when asked about appetite PTA and otherwise did not respond in any way to RD questions. Shortly after RD visit RT reported to RN that pt had requested pain medication.   Pt was seen by this RD in October 2017. She was also seen by RD at [facility] on 12/09/16 and 01/20/17. At that time pt had reported decreased appetite which is especially low for several days after chemo. She also reported low energy levels and low desire to eat. She was drinking Boost BID PTA. She was eating items which was soft and easy to prepare and also eating frozen meals she could quickly heat in the microwave d/t fatigue.   Physical assessment unable to be done at this time; will attempt at follow-up. Per chart review, pt has lost 22 lbs (11.5% body weight) in the past 3 months; this is significant for time frame. Suspect pt meets criteria for some degree of malnutrition but unable to state with  certainty at this time d/t limited information available. Will update as warranted.  Medications reviewed; 10 mEq IV KCl x4 runs today. Labs reviewed; K: 2.9 mmol/L, Ca: 8.2 mg/dL.  IVF: NS @ 100 mL/hr.      Diet Order:  Diet NPO time specified Except for: Sips with Meds  Skin:  Reviewed, no issues  Last BM:  PTA/unknown  Height:   Ht Readings from Last 1 Encounters:  02/05/17 5\' 5"  (1.651 m)    Weight:   Wt Readings from Last 1 Encounters:  02/05/17 169 lb 1.5 oz (76.7 kg)    Ideal Body Weight:  56.82 kg  BMI:  Body mass index is 28.14 kg/m.  Estimated Nutritional Needs:   Kcal:  2300-2530 (30-33 kcal/kg)  Protein:  115-130 grams (1.5-1.7 gram/kg)  Fluid:  >/= 2.1 L/day  EDUCATION NEEDS:   No education needs identified at this time    Jarome Matin, MS, RD, LDN, CNSC Inpatient Clinical Dietitian Pager # [phone] After hours/weekend pager # [phone]

## 2017-02-06 NOTE — Progress Notes (Signed)
Pt had 84 beat run vtach. VSs stable. Patient w/ no complaints. Notified Provider on call. No new orders. Continue to monitor.

## 2017-02-06 NOTE — Care Management Note (Signed)
Case Management Note  Patient Details  Name: Heather Banks MRN: [id number] Date of Birth: 12-28-1950  Subjective/Objective:                  Hypoxia and confusion  Action/Plan: Date:  February 06, 2017 Chart reviewed for concurrent status and case management needs. Will continue to follow patient progress. Discharge Planning: following for needs Expected discharge date: [id number] Velva Harman, BSN, [place], [place]  Expected Discharge Date:   (UNKNOWN)               Expected Discharge Plan:  Home/Self Care  In-House Referral:     Discharge planning Services  CM Consult  Post Acute Care Choice:    Choice offered to:     DME Arranged:    DME Agency:     HH Arranged:    HH Agency:     Status of Service:  In process, will continue to follow  If discussed at Long Length of Stay Meetings, dates discussed:    Additional Comments:  Leeroy Cha, RN 02/06/2017, 8:47 AM

## 2017-02-07 DIAGNOSIS — C3492 Malignant neoplasm of unspecified part of left bronchus or lung: Secondary | ICD-10-CM

## 2017-02-07 LAB — BASIC METABOLIC PANEL
Anion gap: 10 (ref 5–15)
BUN: <5 mg/dL — ABNORMAL LOW (ref 6–20)
CALCIUM: 8 mg/dL — AB (ref 8.9–10.3)
CO2: 25 mmol/L (ref 22–32)
Chloride: 104 mmol/L (ref 101–111)
Creatinine, Ser: 0.34 mg/dL — ABNORMAL LOW (ref 0.44–1.00)
GFR calc non Af Amer: >60 mL/min (ref >60)
GLUCOSE: 93 mg/dL (ref 65–99)
Potassium: 2.7 mmol/L — CL (ref 3.5–5.1)
Sodium: 139 mmol/L (ref 135–145)

## 2017-02-07 LAB — CBC WITH DIFFERENTIAL/PLATELET
BASOS PCT: 1 %
Basophils Absolute: 0.1 10*3/uL (ref 0.0–0.1)
EOS ABS: 0 10*3/uL (ref 0.0–0.7)
EOS PCT: 0 %
HCT: 31.8 % — ABNORMAL LOW (ref 36.0–46.0)
Hemoglobin: 10.5 g/dL — ABNORMAL LOW (ref 12.0–15.0)
LYMPHS ABS: 0.9 10*3/uL (ref 0.7–4.0)
Lymphocytes Relative: 10 %
MCH: 31.5 pg (ref 26.0–34.0)
MCHC: 33 g/dL (ref 30.0–36.0)
MCV: 95.5 fL (ref 78.0–100.0)
MONO ABS: 1.4 10*3/uL — AB (ref 0.1–1.0)
MONOS PCT: 15 %
NEUTROS PCT: 74 %
Neutro Abs: 7.1 10*3/uL (ref 1.7–7.7)
Platelets: 344 10*3/uL (ref 150–400)
RBC: 3.33 MIL/uL — ABNORMAL LOW (ref 3.87–5.11)
RDW: 18 % — AB (ref 11.5–15.5)
WBC: 9.5 10*3/uL (ref 4.0–10.5)

## 2017-02-07 MED ORDER — POTASSIUM CHLORIDE CRYS ER 20 MEQ PO TBCR
20.0000 meq | EXTENDED_RELEASE_TABLET | Freq: Two times a day (BID) | ORAL | Status: DC
  Administered 2017-02-07 – 2017-02-08 (×2): 20 meq via ORAL
  Filled 2017-02-07 (×2): qty 1

## 2017-02-07 MED ORDER — POTASSIUM CHLORIDE CRYS ER 20 MEQ PO TBCR
20.0000 meq | EXTENDED_RELEASE_TABLET | Freq: Once | ORAL | Status: AC
  Administered 2017-02-07: 20 meq via ORAL
  Filled 2017-02-07: qty 1

## 2017-02-07 MED ORDER — MAGNESIUM SULFATE 2 GM/50ML IV SOLN
2.0000 g | Freq: Once | INTRAVENOUS | Status: AC
Start: 2017-02-07 — End: 2017-02-07
  Administered 2017-02-07: 2 g via INTRAVENOUS
  Filled 2017-02-07: qty 50

## 2017-02-07 MED ORDER — POTASSIUM CHLORIDE 10 MEQ/100ML IV SOLN
10.0000 meq | INTRAVENOUS | Status: AC
  Administered 2017-02-07 (×3): 10 meq via INTRAVENOUS
  Filled 2017-02-07 (×3): qty 100

## 2017-02-07 MED ORDER — KETOROLAC TROMETHAMINE 15 MG/ML IJ SOLN
15.0000 mg | Freq: Three times a day (TID) | INTRAMUSCULAR | Status: DC | PRN
  Administered 2017-02-07 – 2017-02-08 (×3): 15 mg via INTRAVENOUS
  Filled 2017-02-07 (×3): qty 1

## 2017-02-07 MED ORDER — AMLODIPINE BESYLATE 5 MG PO TABS
5.0000 mg | ORAL_TABLET | Freq: Every day | ORAL | Status: DC
  Administered 2017-02-07 – 2017-02-08 (×2): 5 mg via ORAL
  Filled 2017-02-07 (×2): qty 1

## 2017-02-07 NOTE — Progress Notes (Addendum)
PROGRESS NOTE [facility]   Dawnyel Leven   GEX:[id number] DOB: 08-16-1950  DOA: 02/05/2017 PCP: Scot Jun, FNP   Brief Narrative:  Heather Banks is a 66 y.o. female with medical history significant of COPD, Stage 4 lung cancer on chemotherpay followed by Dr.Mohamed, Anxiety, depression was brought to the ED by her family with complaints of cough-productive of phlegm, dyspnea, confusion/lethargy and desaturation to the 80's for 1 day PTA. Patient was found to have pneumonia on CT findings and was admitted for further treatment.   Subjective: Patient seen and examined, completely different patient and just that she is now awake alert oriented sitting in bed having a full conversation with daughter and RN. Patient report feeling great and feeling hungry. Denies chest pain, shortness of breath, palpitations and cough.  Assessment & Plan: Acute resp failure with hypoxia - due to pneumonia - significant improvement Blood culture grew 1/2 bottles staph species - likely contaminant  MRSA negative  Procalcitonin would not be indicated as patient on active chemo  Vanco discontinued 02/06/2017 Continue cefepime for now, if remains stable condition discharged on oral antibiotics in the morning. Patient off oxygen and saturating well. PT has been ordered   Acute metabolic encephalopathy - Completely resolved  Due to infectious process  See above   HTN - BP above goal likely due to aggressive IVF and holding BP meds  Resume Norvasc  D/c IVF  Monitor BP   Stage 4 lung CA  Stable  On active chemo with Dr Julien Nordmann   COPD - stable  Doesn't seem on exacerbation at this point.  ABG with no CO2 retention  Continue neb and O2 as needed   VTach - no more events on tele  Due to hypokalemia  Continue to monitor tele as K still low.   Hypokalemia - remains low  Replete  Magnesium given  Check BMP in AM   DVT prophylaxis: Heparin sq  Code Status: FULL code  Family  Communication: None at bedside  Disposition Plan: Transfer to telemetry if stable in AM will d/c home   Consultants:   Dr. Julien Nordmann   Procedures:   None   Antimicrobials: Anti-infectives    Start     Dose/Rate Route Frequency Ordered Stop   02/06/17 0300  vancomycin (VANCOCIN) IVPB 1000 mg/200 mL premix  Status:  Discontinued     1,000 mg 200 mL/hr over 60 Minutes Intravenous Every 12 hours 02/05/17 1943 02/07/17 0918   02/06/17 0000  ceFEPIme (MAXIPIME) 2 g in dextrose 5 % 50 mL IVPB     2 g 100 mL/hr over 30 Minutes Intravenous Every 12 hours 02/05/17 1943     02/05/17 1500  ceFEPIme (MAXIPIME) 1 g in dextrose 5 % 50 mL IVPB     1 g 100 mL/hr over 30 Minutes Intravenous  Once 02/05/17 1449 02/05/17 1545   02/05/17 1500  vancomycin (VANCOCIN) IVPB 1000 mg/200 mL premix     1,000 mg 200 mL/hr over 60 Minutes Intravenous  Once 02/05/17 1449 02/05/17 1744      Objective: Vitals:   02/07/17 1000 02/07/17 1200 02/07/17 1329 02/07/17 1354  BP: (!) 166/98  (!) 143/103   Pulse: 89  80   Resp: 14  19   Temp:  98.8 F (37.1 C) 98.3 F (36.8 C)   TempSrc:  Oral Oral   SpO2: 98%  96% 94%  Weight:      Height:        Intake/Output Summary (Last 24  hours) at 02/07/17 1628 Last data filed at 02/07/17 1530  Gross per 24 hour  Intake           2297.5 ml  Output              875 ml  Net           1422.5 ml   Filed Weights   02/05/17 1122 02/05/17 1850  Weight: 78 kg (172 lb) 76.7 kg (169 lb 1.5 oz)    Examination:  General: Pt is alert, awake, not in acute distress Cardiovascular: RRR, S1/S2 +, no rubs, no gallops Respiratory: CTA bilaterally, no wheezing, no rhonchi Abdominal: Soft, NT, ND, bowel sounds + Neurology: AOx3, non focal.  Extremities: no edema, no cyanosis  Data Reviewed: I have personally reviewed following labs and imaging studies  CBC:  Recent Labs Lab 02/03/17 0920 02/05/17 1137 02/05/17 2027 02/06/17 0329 02/07/17 0345  WBC 10.1 8.1 12.1*  9.4 9.5  NEUTROABS 8.3* 5.9  --   --  7.1  HGB 12.2 11.6* 11.1* 10.6* 10.5*  HCT 36.6 34.8* 34.1* 32.4* 31.8*  MCV 95.0 94.3 96.1 95.6 95.5  PLT 298 392 387 350 329   Basic Metabolic Panel:  Recent Labs Lab 02/03/17 0920 02/05/17 1137 02/05/17 2027 02/06/17 0329 02/06/17 1653 02/07/17 0345  NA 141 142  --  142  --  139  K 4.0 3.2*  --  2.9* 2.8* 2.7*  CL  --  105  --  106  --  104  CO2 26 23  --  24  --  25  GLUCOSE 107 85  --  84  --  93  BUN 7.1 9  --  6  --  <5*  CREATININE 0.6 0.46 0.47 0.38*  --  0.34*  CALCIUM 9.7 8.8*  --  8.2*  --  8.0*  MG  --   --   --  1.8 1.8  --    GFR: Estimated Creatinine Clearance: 73.7 mL/min (A) (by C-G formula based on SCr of 0.34 mg/dL (L)). Liver Function Tests:  Recent Labs Lab 02/03/17 0920 02/05/17 1137 02/06/17 0329  AST 9 16 12*  ALT <6 8* 8*  ALKPHOS 142 115 92  BILITOT <0.22 0.3 0.8  PROT 6.8 6.6 6.1*  ALBUMIN 2.8* 3.1* 2.9*   No results for input(s): LIPASE, AMYLASE in the last 168 hours. No results for input(s): AMMONIA in the last 168 hours. Coagulation Profile:  Recent Labs Lab 02/05/17 1137  INR 1.19   Cardiac Enzymes: No results for input(s): CKTOTAL, CKMB, CKMBINDEX, TROPONINI in the last 168 hours. BNP (last 3 results) No results for input(s): PROBNP in the last 8760 hours. HbA1C: No results for input(s): HGBA1C in the last 72 hours. CBG: No results for input(s): GLUCAP in the last 168 hours. Lipid Profile: No results for input(s): CHOL, HDL, LDLCALC, TRIG, CHOLHDL, LDLDIRECT in the last 72 hours. Thyroid Function Tests: No results for input(s): TSH, T4TOTAL, FREET4, T3FREE, THYROIDAB in the last 72 hours. Anemia Panel: No results for input(s): VITAMINB12, FOLATE, FERRITIN, TIBC, IRON, RETICCTPCT in the last 72 hours. Sepsis Labs:  Recent Labs Lab 02/05/17 1149 02/05/17 1433  LATICACIDVEN 0.87 0.45*    Recent Results (from the past 240 hour(s))  Culture, blood (Routine x 2)     Status:  None (Preliminary result)   Collection Time: 02/05/17 11:40 AM  Result Value Ref Range Status   Specimen Description BLOOD PORTA CATH  Final   Special Requests  Final    BOTTLES DRAWN AEROBIC AND ANAEROBIC Blood Culture adequate volume   Culture   Final    NO GROWTH 2 DAYS Performed at [facility], [place] [address], [place], [place] [address]    Report Status PENDING  Incomplete  Culture, blood (Routine x 2)     Status: Abnormal (Preliminary result)   Collection Time: 02/05/17 11:51 AM  Result Value Ref Range Status   Specimen Description BLOOD BLOOD RIGHT HAND  Final   Special Requests   Final    BOTTLES DRAWN AEROBIC AND ANAEROBIC Blood Culture adequate volume   Culture  Setup Time   Final    GRAM POSITIVE COCCI IN CLUSTERS AEROBIC BOTTLE ONLY CRITICAL RESULT CALLED TO, READ BACK BY AND VERIFIED WITH: D. Zeigler Pharm.D. 11:10 02/06/17 ([email or web address])    Culture (A)  Final    STAPHYLOCOCCUS SPECIES (COAGULASE NEGATIVE) THE SIGNIFICANCE OF ISOLATING THIS ORGANISM FROM A SINGLE SET OF BLOOD CULTURES WHEN MULTIPLE SETS ARE DRAWN IS UNCERTAIN. PLEASE NOTIFY THE MICROBIOLOGY DEPARTMENT WITHIN ONE WEEK IF SPECIATION AND SENSITIVITIES ARE REQUIRED. Performed at [facility], [place] [address], [place], [place] [address]    Report Status PENDING  Incomplete  Blood Culture ID Panel (Reflexed)     Status: Abnormal   Collection Time: 02/05/17 11:51 AM  Result Value Ref Range Status   Enterococcus species NOT DETECTED NOT DETECTED Final   Listeria monocytogenes NOT DETECTED NOT DETECTED Final   Staphylococcus species DETECTED (A) NOT DETECTED Final    Comment: Methicillin (oxacillin) resistant coagulase negative staphylococcus. Possible blood culture contaminant (unless isolated from more than one blood culture draw or clinical case suggests pathogenicity). No antibiotic treatment is indicated for blood  culture contaminants. CRITICAL RESULT CALLED TO, READ BACK BY AND VERIFIED WITH: D.  Zeigler Pharm.D. 11:10 02/06/17 ([email or web address])    Staphylococcus aureus NOT DETECTED NOT DETECTED Final   Methicillin resistance DETECTED (A) NOT DETECTED Final    Comment: CRITICAL RESULT CALLED TO, READ BACK BY AND VERIFIED WITH: D. Zeigler Pharm.D. 11:10 02/06/17 ([email or web address])    Streptococcus species NOT DETECTED NOT DETECTED Final   Streptococcus agalactiae NOT DETECTED NOT DETECTED Final   Streptococcus pneumoniae NOT DETECTED NOT DETECTED Final   Streptococcus pyogenes NOT DETECTED NOT DETECTED Final   Acinetobacter baumannii NOT DETECTED NOT DETECTED Final   Enterobacteriaceae species NOT DETECTED NOT DETECTED Final   Enterobacter cloacae complex NOT DETECTED NOT DETECTED Final   Escherichia coli NOT DETECTED NOT DETECTED Final   Klebsiella oxytoca NOT DETECTED NOT DETECTED Final   Klebsiella pneumoniae NOT DETECTED NOT DETECTED Final   Proteus species NOT DETECTED NOT DETECTED Final   Serratia marcescens NOT DETECTED NOT DETECTED Final   Haemophilus influenzae NOT DETECTED NOT DETECTED Final   Neisseria meningitidis NOT DETECTED NOT DETECTED Final   Pseudomonas aeruginosa NOT DETECTED NOT DETECTED Final   Candida albicans NOT DETECTED NOT DETECTED Final   Candida glabrata NOT DETECTED NOT DETECTED Final   Candida krusei NOT DETECTED NOT DETECTED Final   Candida parapsilosis NOT DETECTED NOT DETECTED Final   Candida tropicalis NOT DETECTED NOT DETECTED Final    Comment: Performed at [facility], [place] [address], [place], [place] [address]  MRSA PCR Screening     Status: None   Collection Time: 02/05/17  6:52 PM  Result Value Ref Range Status   MRSA by PCR NEGATIVE NEGATIVE Final    Comment:        The GeneXpert MRSA Assay (  FDA approved for NASAL specimens only), is one component of a comprehensive MRSA colonization surveillance program. It is not intended to diagnose MRSA infection nor to guide or monitor treatment for MRSA infections.      Radiology Studies: No  results found.   Scheduled Meds: . amLODipine  5 mg Oral Daily  . heparin  5,000 Units Subcutaneous Q8H  . ipratropium-albuterol  3 mL Nebulization TID  . loratadine  10 mg Oral Daily  . potassium chloride  20 mEq Oral BID   Continuous Infusions: . ceFEPime (MAXIPIME) IV Stopped (02/07/17 1312)     LOS: 2 days    Time spent: Total of 15 minutes spent with pt, greater than 50% of which was spent in discussion of  treatment, counseling and coordination of care   Chipper Oman, MD Pager: Text Page via [email or web address]  [phone]  If 7PM-7AM, please contact night-coverage [email or web address] Password [facility] 02/07/2017, 4:28 PM

## 2017-02-07 NOTE — Progress Notes (Signed)
CRITICAL VALUE ALERT  Critical Value: K+ 2.7  Date & Time Notied:  02-07-17 0435  Provider Notified: [facility]  Orders Received/Actions taken: K+ replacement PO and IV

## 2017-02-08 LAB — CULTURE, BLOOD (ROUTINE X 2): Special Requests: ADEQUATE

## 2017-02-08 LAB — BASIC METABOLIC PANEL WITH GFR
Anion gap: 9 (ref 5–15)
BUN: <5 mg/dL — ABNORMAL LOW (ref 6–20)
CO2: 27 mmol/L (ref 22–32)
Calcium: 8.5 mg/dL — ABNORMAL LOW (ref 8.9–10.3)
Chloride: 104 mmol/L (ref 101–111)
Creatinine, Ser: 0.41 mg/dL — ABNORMAL LOW (ref 0.44–1.00)
GFR calc Af Amer: >60 mL/min
GFR calc non Af Amer: >60 mL/min
Glucose, Bld: 116 mg/dL — ABNORMAL HIGH (ref 65–99)
Potassium: 3.6 mmol/L (ref 3.5–5.1)
Sodium: 140 mmol/L (ref 135–145)

## 2017-02-08 LAB — MAGNESIUM: Magnesium: 2 mg/dL (ref 1.7–2.4)

## 2017-02-08 MED ORDER — CEFPODOXIME PROXETIL 200 MG PO TABS: 200.0000 mg | ORAL_TABLET | Freq: Two times a day (BID) | ORAL | 0 refills | Status: AC

## 2017-02-08 MED ORDER — HEPARIN SOD (PORK) LOCK FLUSH 100 UNIT/ML IV SOLN
500.0000 [IU] | INTRAVENOUS | Status: AC | PRN
  Administered 2017-02-08: 500 [IU]

## 2017-02-08 MED ORDER — IPRATROPIUM-ALBUTEROL 0.5-2.5 (3) MG/3ML IN SOLN: 3.0000 mL | RESPIRATORY_TRACT | Status: DC | PRN

## 2017-02-08 MED ORDER — SODIUM CHLORIDE 0.9% FLUSH: 10.0000 mL | Freq: Two times a day (BID) | INTRAVENOUS | Status: DC

## 2017-02-08 MED ORDER — POTASSIUM CHLORIDE CRYS ER 20 MEQ PO TBCR: 20.0000 meq | EXTENDED_RELEASE_TABLET | Freq: Every day | ORAL | 0 refills | Status: DC

## 2017-02-08 NOTE — Progress Notes (Signed)
Reviewed discharge information with patient and caregiver. Answered all questions. Patient/caregiver able to teach back medications and reasons to contact MD/911. Patient verbalizes importance of PCP follow up appointment.  Kelsi Benham M. Trayveon Beckford, RN  

## 2017-02-08 NOTE — Evaluation (Signed)
Physical Therapy Evaluation Patient Details Name: Heather Banks MRN: [id number] DOB: 11/21/1950 Today's Date: 02/08/2017   History of Present Illness  Heather Banks is a 66 y.o. female with medical history significant of COPD, Stage 4 lung cancer on chemotherpay followed by Dr.Mohamed, Anxiety, depression was brought to the ED by her family with complaints of cough  Clinical Impression  Patient evaluated by Physical Therapy with no further acute PT needs identified. All education has been completed and the patient has no further questions. See below for any follow-up Physical Therapy or equipment needs. PT is signing off. Thank you for this referral.   Recommend HHPT  However pt declines; encouraged pt to use cane at home and incr activity as tol     Follow Up Recommendations  (rec HHPT--pt declines)    Equipment Recommendations  None recommended by PT (pt declines)    Recommendations for Other Services       Precautions / Restrictions Precautions Precautions: Fall      Mobility  Bed Mobility Overal bed mobility: Modified Independent Bed Mobility: Supine to Sit              Transfers Overall transfer level: Needs assistance Equipment used: None Transfers: Sit to/from Stand Sit to Stand: Min guard         General transfer comment: for safety  Ambulation/Gait Ambulation/Gait assistance: Min assist;Min guard Ambulation Distance (Feet):  (10' more to BR) Assistive device: Straight cane Gait Pattern/deviations: Step-through pattern;Decreased stride length;Trunk flexed;Drifts right/left Gait velocity: HR 120, O2 sats 98%, incr WOB/3/4 DOE with amb   General Gait Details: cues for safety, min to min/guard for balance and safety throughout distance; LOB x2 with light  min  assist to recover; delayed balance reactions  Stairs            Wheelchair Mobility    Modified Rankin (Stroke Patients Only)       Balance Overall balance assessment: Needs  assistance Sitting-balance support: Feet supported;No upper extremity supported Sitting balance-Leahy Scale: Good       Standing balance-Leahy Scale: Fair                               Pertinent Vitals/Pain Pain Assessment: No/denies pain    Home Living Family/patient expects to be discharged to:: Private residence Living Arrangements: Parent (lives with 97 yo mother) Available Help at Discharge: Family Type of Home: House Home Access: Level entry     Home Layout: One level Home Equipment: Radio producer - single point Additional Comments: pt reports she doesn't want any additional DME    Prior Function Level of Independence: Independent with assistive device(s)         Comments: uses cane PRN--mostly when going out     Hand Dominance        Extremity/Trunk Assessment   Upper Extremity Assessment Upper Extremity Assessment: Overall WFL for tasks assessed    Lower Extremity Assessment Lower Extremity Assessment: Generalized weakness       Communication   Communication: No difficulties  Cognition Arousal/Alertness: Awake/alert Behavior During Therapy: WFL for tasks assessed/performed Overall Cognitive Status: Within Functional Limits for tasks assessed                                        General Comments      Exercises     Assessment/Plan  PT Assessment All further PT needs can be met in the next venue of care (pt refuses HHPT and any DME)  PT Problem List         PT Treatment Interventions      PT Goals (Current goals can be found in the Care Plan section)  Acute Rehab PT Goals PT Goal Formulation: All assessment and education complete, DC therapy (pt to d/C)    Frequency     Barriers to discharge        Co-evaluation               AM-PAC PT "6 Clicks" Daily Activity  Outcome Measure Difficulty turning over in bed (including adjusting bedclothes, sheets and blankets)?: None Difficulty moving from lying  on back to sitting on the side of the bed? : None Difficulty sitting down on and standing up from a chair with arms (e.g., wheelchair, bedside commode, etc,.)?: A Little Help needed moving to and from a bed to chair (including a wheelchair)?: A Little Help needed walking in hospital room?: A Little Help needed climbing 3-5 steps with a railing? : A Lot 6 Click Score: 19    End of Session Equipment Utilized During Treatment: Gait belt Activity Tolerance: Patient tolerated treatment well Patient left: in chair;with call bell/phone within reach;with chair alarm set   PT Visit Diagnosis: Unsteadiness on feet (R26.81)    Time: 7169-6789 PT Time Calculation (min) (ACUTE ONLY): 18 min   Charges:   PT Evaluation $PT Eval Low Complexity: 1 Procedure     PT G CodesKenyon Ana, PT Pager: [phone] 02/08/2017'  [place] 02/08/2017, 11:14 AM

## 2017-02-08 NOTE — Discharge Summary (Signed)
Physician Discharge Summary  Twilia Yaklin  CWC:[id number]  DOB: 07/20/1951  DOA: 02/05/2017 PCP: Scot Jun, FNP  Admit date: 02/05/2017 Discharge date: 02/08/2017  Admitted From: Home  Disposition:  Home   Recommendations for Outpatient Follow-up:  1. Follow up with PCP in 1 week 2. Please obtain BMP/CBC in one week  Home Health: Recommending home health PT but patient declined  Equipment/Devices: Walker   Discharge Condition: Stable  CODE STATUS: FULL  Diet recommendation: Heart Healthy   Brief/Interim Summary: Roland Prine a 66 y.o.femalewith medical history significant of COPD, Stage 4 lung cancer on chemotherpay followed by Dr.Mohamed, Anxiety, depression was brought to the ED by her family with complaints of cough-productive of phlegm, dyspnea, confusion/lethargy and desaturation to the 80's for 1 day PTA. Patient was found to have pneumonia on CT findings and was admitted for further treatment.   Subjective: Patient seen and examined on the day of discharge. Patient doing well. Denies chest pain, SOB, cough or palpitations. Per nurse patient slight unsteady on her walking. PT evaluated and recommended HH PT but patient declined. Patient is back to baseline mentally, remains afebrile.    Discharge Diagnoses/Hospital Course:  Acute resp failure with hypoxia - due to pneumonia - significant improvement Blood culture grew 1/2 bottles staph species - likely contaminant  MRSA negative  Procalcitonin was not followed as patient on active chemo  Initially treated with Cefepime and Vanco. Vancomycin d/ced 02/06/2017 as MRSA negative  Patient received 3 days of Cefepime, will discharge on Vantin to complete total of 10 days of abx  Patient off oxygen and saturating well. Follow up with PCP   Acute metabolic encephalopathy - Completely resolved  Due to infectious process  See above   HTN - Stable  BP was above goal likely due to aggressive IVF and holding BP meds.  After d/c IVF and resuming Norvasc BP has normalized. Patient will continue Norvasc 5 mg daily  Monitor BP and follow up as OP   Stage 4 lung CA  Follow up with Dr Julien Nordmann as OP    COPD - stable  ABG with no CO2 retention  Continue home medication, no changes were made  VTach - resolved  1 episode due to hypokalemia   Hypokalemia and hypomagnesemia  - resolved  Repleted via IV and oral route  KDur daily prescribed  Check BMP and Mag in 1 week   All other chronic medical condition were stable during the hospitalization.  Patient was seen by physical therapy, recommended HH PT but patient declined.  On the day of the discharge the patient's vitals were stable, and no other acute medical condition were reported by patient.   Discharge Instructions  You were cared for by a hospitalist during your hospital stay. If you have any questions about your discharge medications or the care you received while you were in the hospital after you are discharged, you can call the unit and asked to speak with the hospitalist on call if the hospitalist that took care of you is not available. Once you are discharged, your primary care physician will handle any further medical issues. Please note that NO REFILLS for any discharge medications will be authorized once you are discharged, as it is imperative that you return to your primary care physician (or establish a relationship with a primary care physician if you do not have one) for your aftercare needs so that they can reassess your need for medications and monitor your lab values.  Discharge  Instructions    Call MD for:  difficulty breathing, headache or visual disturbances    Complete by:  As directed    Call MD for:  extreme fatigue    Complete by:  As directed    Call MD for:  hives    Complete by:  As directed    Call MD for:  persistant dizziness or light-headedness    Complete by:  As directed    Call MD for:  persistant nausea and  vomiting    Complete by:  As directed    Call MD for:  redness, tenderness, or signs of infection (pain, swelling, redness, odor or green/yellow discharge around incision site)    Complete by:  As directed    Call MD for:  severe uncontrolled pain    Complete by:  As directed    Call MD for:  temperature >100.4    Complete by:  As directed    Diet - low sodium heart healthy    Complete by:  As directed    Increase activity slowly    Complete by:  As directed      Allergies as of 02/08/2017      Reactions   Ativan [lorazepam] Other (See Comments)   Reaction:  Makes pt angry - opposite reaction of med should do   Cinnamon Other (See Comments)   Seasonal scent trigger migraine as well as pt's overall immune system      Medication List    STOP taking these medications   aspirin-acetaminophen-caffeine 250-250-65 MG tablet Commonly known as:  EXCEDRIN MIGRAINE   Tiotropium Bromide Monohydrate 2.5 MCG/ACT Aers Commonly known as:  SPIRIVA RESPIMAT     TAKE these medications   acidophilus Caps capsule Take 1 capsule by mouth daily.   ALPRAZolam 0.25 MG tablet Commonly known as:  XANAX Take 1 tablet (0.25 mg total) by mouth 2 (two) times daily as needed for anxiety. What changed:  when to take this   amLODipine 5 MG tablet Commonly known as:  NORVASC Take 1 tablet (5 mg total) by mouth daily.   ASPERCREME LIDOCAINE 4 % Ptch Generic drug:  Lidocaine Apply 1 patch topically daily as needed. Place on back as needed for pain   calcium carbonate 1250 (500 Ca) MG tablet Commonly known as:  OS-CAL - dosed in mg of elemental calcium Take 1 tablet by mouth daily with breakfast.   cefpodoxime 200 MG tablet Commonly known as:  VANTIN Take 1 tablet (200 mg total) by mouth 2 (two) times daily.   cholecalciferol 1000 units tablet Commonly known as:  VITAMIN D Take 1,000 Units by mouth daily.   cyanocobalamin 2000 MCG tablet Take 2,000 mcg by mouth daily.   dexamethasone 4 MG  tablet Commonly known as:  DECADRON 2 tablets by mouth twice a day the day before, day of and day after the chemotherapy every 3 weeks.   diphenhydrAMINE 25 mg capsule Commonly known as:  BENADRYL Take 25 mg by mouth every 6 (six) hours as needed for itching. Reported on 01/19/2016   famotidine 20 MG tablet Commonly known as:  PEPCID Take 1 tablet (20 mg total) by mouth daily. What changed:  when to take this  reasons to take this   hydroxypropyl methylcellulose / hypromellose 2.5 % ophthalmic solution Commonly known as:  ISOPTO TEARS / GONIOVISC 1 drop.   lidocaine-prilocaine cream Commonly known as:  EMLA Apply 1 application topically as needed. Apply 1 tsp over port site 1-1.5 hours prior  to chemotherapy.   loratadine 10 MG tablet Commonly known as:  CLARITIN Take 1 tablet (10 mg total) by mouth daily.   mirtazapine 30 MG tablet Commonly known as:  REMERON Take 0.5 tablets (15 mg total) by mouth at bedtime as needed (for sleep). What changed:  how much to take  when to take this   ondansetron 8 MG tablet Commonly known as:  ZOFRAN Take 1 tablet (8 mg total) by mouth every 8 (eight) hours as needed for nausea or vomiting.   potassium chloride SA 20 MEQ tablet Commonly known as:  K-DUR,KLOR-CON Take 1 tablet (20 mEq total) by mouth daily.   prochlorperazine 10 MG tablet Commonly known as:  COMPAZINE Take 1 tablet (10 mg total) by mouth every 6 (six) hours as needed for nausea or vomiting.   umeclidinium-vilanterol 62.5-25 MCG/INH Aepb Commonly known as:  ANORO ELLIPTA Inhale 1 puff into the lungs daily.   VENTOLIN HFA 108 (90 Base) MCG/ACT inhaler Generic drug:  albuterol INHALE 2 PUFFS BY MOUTH EVERY 6 HOURS AS NEEDED FOR WHEEZE OR SHORTNESS OF BREATH What changed:  Another medication with the same name was removed. Continue taking this medication, and follow the directions you see here.      Follow-up Information    Scot Jun, FNP. Schedule an  appointment as soon as possible for a visit in 1 week(s).   Specialty:  Family Medicine Why:  Hospital follow up  Contact information: [place] [id number] [phone]          Allergies  Allergen Reactions  . Ativan [Lorazepam] Other (See Comments)    Reaction:  Makes pt angry - opposite reaction of med should do  . Cinnamon Other (See Comments)    Seasonal scent trigger migraine as well as pt's overall immune system    Consultations:  None    Procedures/Studies: Dg Chest 2 View  Result Date: 02/05/2017 CLINICAL DATA:  Weakness. Shortness of breath. History lung cancer. EXAM: CHEST  2 VIEW COMPARISON:  05/18/2016 FINDINGS: Lateral view degraded by patient arm position. Vertebral augmentation and upper thoracic levels. A right Port-A-Cath terminates at the low SVC or high right atrium. Patient rotated right on the frontal. Normal heart size. Atherosclerosis in the transverse aorta. No pleural effusion or pneumothorax. Right greater than left upper lobe opacities with primarily left-sided apical thickening. Diffuse peribronchial thickening. Clear lower lungs. IMPRESSION: Right greater than left upper lobe and apical opacities. Given history of lung cancer, radiation therapy, and the appearance on prior CT, findings are at least partially favored to be due to scarring. However, especially at the right upper lobe, patchy pneumonia cannot be excluded. Short term radiographic follow-up in 2-3 days should be considered. Peribronchial thickening which may relate to chronic bronchitis or smoking. Aortic Atherosclerosis (ICD10-I70.0). Electronically Signed   By: Abigail Miyamoto M.D.   On: 02/05/2017 12:32   Ct Head Wo Contrast  Result Date: 02/05/2017 CLINICAL DATA:  Confusion, history lung cancer EXAM: CT HEAD WITHOUT CONTRAST TECHNIQUE: Contiguous axial images were obtained from the base of the skull through the vertex without intravenous contrast. Sagittal and coronal MPR  images reconstructed from axial data set. COMPARISON:  None FINDINGS: Brain: Normal ventricular morphology. No midline shift or mass effect. Normal appearance of brain parenchyma. No intracranial hemorrhage, mass lesion or evidence acute infarction. No extra-axial fluid collections. Vascular: Minimal atherosclerotic calcification within internal carotid arteries at skullbase Skull: Intact Sinuses/Orbits: Opacification within sphenoid sinus with mild sphenoid sinus  osseous wall thickening. Concha bullosa of the middle turbinates. Other: N/A IMPRESSION: No acute intracranial abnormalities. Sphenoid sinus disease. Electronically Signed   By: Lavonia Dana M.D.   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 Persistent left-sided pain. Confusion. EXAM: CT ANGIOGRAPHY CHEST CT ABDOMEN AND PELVIS WITH CONTRAST TECHNIQUE: Multidetector CT imaging of the chest was performed using the standard protocol during bolus administration of intravenous contrast. Multiplanar CT image reconstructions and MIPs were obtained to evaluate the vascular anatomy. Multidetector CT imaging of the abdomen and pelvis was performed using the standard protocol during bolus administration of intravenous contrast. CONTRAST:  100 cc of Isovue 370 COMPARISON:  Chest radiograph of earlier today.  CTs of 11/14/2016. FINDINGS: CTA CHEST FINDINGS Cardiovascular:  Mild motion degradation The quality of this exam for evaluation of pulmonary embolism is moderate to good. No evidence of pulmonary embolism. Advanced aortic and branch vessel atherosclerosis. Mild cardiomegaly with lipomatous hypertrophy of the interatrial septum. A right Port-A-Cath which terminates in the right atrium. Mediastinum/Nodes: No supraclavicular adenopathy. No mediastinal or hilar adenopathy. Tiny hiatal hernia. Lungs/Pleura: No pleural fluid. Mild left-sided pleural thickening is  similar. Moderate centrilobular emphysema. Lower lobe predominant bronchial wall thickening. Spiculated right apical pulmonary nodule measures 1.6 x 1.1 cm on image 22/series 8 and is felt to be similar to 1.7 x 1.5 cm on the prior. This solid component of a subjacent cavitary lesion measures 1.6 x 1.2 cm on image 27/series 8. Compare 1.6 x 1.1 cm on the prior. Right middle lobe ground-glass opacity measures on the order of 2.6 x 2.1 cm today versus 3.3 x 2.3 cm on the prior. Left apical and central upper lobe masslike consolidation. Example at 3.1 x 2.6 cm on image 19/series 8. Similar. Extension toward the left suprahilar left upper lobe measures 2.8 x 1.8 cm on image 28/series 8. Compare 3.0 x 2.2 cm at the same level on the prior. Mild anterior left lower lobe ground-glass opacity may be radiation induced but is new. A 3 mm left lower lobe pulmonary nodule is unchanged on image 37/series 8. Superior segment left lower lobe septal thickening and patchy airspace disease are new and may also be radiation induced. Musculoskeletal: T3 and T4 moderate and severe compression deformities respectively. Status post vertebral augmentation. Similar. Accentuation of expected thoracic kyphosis about this level. Review of the MIP images confirms the above findings. CT ABDOMEN and PELVIS FINDINGS Hepatobiliary: Too small to characterize hepatic dome lesion. No suspicious liver lesion. Mild caudate lobe enlargement is nonspecific. The gallbladder is mildly distended. No pericholecystic edema or calcified stone. Upper normal intrahepatic ducts. No common duct dilatation. Pancreas: Mild pancreatic atrophy, without duct dilatation dominant mass. Spleen: Normal in size, without focal abnormality. Adrenals/Urinary Tract: Mild motion artifact within the upper abdomen. Normal adrenal glands. Bilateral too small to characterize renal lesions. 1.5 cm right renal cyst. No hydronephrosis. Stomach/Bowel: Gastric antral underdistention.  Colonic stool burden suggests constipation. Normal small bowel. Vascular/Lymphatic: Non aneurysmal dilatation of the infrarenal aorta at 12.5 cm is not significantly changed. No abdominopelvic adenopathy. Reproductive: Hysterectomy.  No adnexal mass. Other: No significant free fluid. Musculoskeletal: Tarlov cysts. Review of the MIP images confirms the above findings. IMPRESSION: 1.  No evidence of pulmonary embolism. 2. Right upper lobe pulmonary nodules and left upper lobe/apical masslike consolidation, similar. A right middle lobe ground-glass opacity is slightly decreased. 3. No thoracic  adenopathy. 4. Coronary artery atherosclerosis. Aortic Atherosclerosis (ICD10-I70.0). 5. Similar T3 and T4 compression deformities, status post vertebral augmentation. 6. No acute process or evidence of metastatic disease in the abdomen or pelvis. 7. Gallbladder distension which is nonspecific. Correlate with right upper quadrant symptoms and possibly right upper quadrant ultrasound. 8.  Tiny hiatal hernia. 9. Areas of superior segment left lower lobe and left lower lobe mild ground-glass and airspace opacity are nonspecific but could be radiation induced. Given they are new since the prior exam, areas of infection cannot be excluded. Electronically Signed   By: Abigail Miyamoto M.D.   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 Persistent left-sided pain. Confusion. EXAM: CT ANGIOGRAPHY CHEST CT ABDOMEN AND PELVIS WITH CONTRAST TECHNIQUE: Multidetector CT imaging of the chest was performed using the standard protocol during bolus administration of intravenous contrast. Multiplanar CT image reconstructions and MIPs were obtained to evaluate the vascular anatomy. Multidetector CT imaging of the abdomen and pelvis was performed using the standard protocol during bolus administration of intravenous contrast. CONTRAST:  100 cc of Isovue  370 COMPARISON:  Chest radiograph of earlier today.  CTs of 11/14/2016. FINDINGS: CTA CHEST FINDINGS Cardiovascular:  Mild motion degradation The quality of this exam for evaluation of pulmonary embolism is moderate to good. No evidence of pulmonary embolism. Advanced aortic and branch vessel atherosclerosis. Mild cardiomegaly with lipomatous hypertrophy of the interatrial septum. A right Port-A-Cath which terminates in the right atrium. Mediastinum/Nodes: No supraclavicular adenopathy. No mediastinal or hilar adenopathy. Tiny hiatal hernia. Lungs/Pleura: No pleural fluid. Mild left-sided pleural thickening is similar. Moderate centrilobular emphysema. Lower lobe predominant bronchial wall thickening. Spiculated right apical pulmonary nodule measures 1.6 x 1.1 cm on image 22/series 8 and is felt to be similar to 1.7 x 1.5 cm on the prior. This solid component of a subjacent cavitary lesion measures 1.6 x 1.2 cm on image 27/series 8. Compare 1.6 x 1.1 cm on the prior. Right middle lobe ground-glass opacity measures on the order of 2.6 x 2.1 cm today versus 3.3 x 2.3 cm on the prior. Left apical and central upper lobe masslike consolidation. Example at 3.1 x 2.6 cm on image 19/series 8. Similar. Extension toward the left suprahilar left upper lobe measures 2.8 x 1.8 cm on image 28/series 8. Compare 3.0 x 2.2 cm at the same level on the prior. Mild anterior left lower lobe ground-glass opacity may be radiation induced but is new. A 3 mm left lower lobe pulmonary nodule is unchanged on image 37/series 8. Superior segment left lower lobe septal thickening and patchy airspace disease are new and may also be radiation induced. Musculoskeletal: T3 and T4 moderate and severe compression deformities respectively. Status post vertebral augmentation. Similar. Accentuation of expected thoracic kyphosis about this level. Review of the MIP images confirms the above findings. CT ABDOMEN and PELVIS FINDINGS Hepatobiliary: Too small  to characterize hepatic dome lesion. No suspicious liver lesion. Mild caudate lobe enlargement is nonspecific. The gallbladder is mildly distended. No pericholecystic edema or calcified stone. Upper normal intrahepatic ducts. No common duct dilatation. Pancreas: Mild pancreatic atrophy, without duct dilatation dominant mass. Spleen: Normal in size, without focal abnormality. Adrenals/Urinary Tract: Mild motion artifact within the upper abdomen. Normal adrenal glands. Bilateral too small to characterize renal lesions. 1.5 cm right renal cyst. No hydronephrosis. Stomach/Bowel: Gastric antral underdistention. Colonic stool burden suggests constipation. Normal small bowel. Vascular/Lymphatic: Non  aneurysmal dilatation of the infrarenal aorta at 12.5 cm is not significantly changed. No abdominopelvic adenopathy. Reproductive: Hysterectomy.  No adnexal mass. Other: No significant free fluid. Musculoskeletal: Tarlov cysts. Review of the MIP images confirms the above findings. IMPRESSION: 1.  No evidence of pulmonary embolism. 2. Right upper lobe pulmonary nodules and left upper lobe/apical masslike consolidation, similar. A right middle lobe ground-glass opacity is slightly decreased. 3. No thoracic adenopathy. 4. Coronary artery atherosclerosis. Aortic Atherosclerosis (ICD10-I70.0). 5. Similar T3 and T4 compression deformities, status post vertebral augmentation. 6. No acute process or evidence of metastatic disease in the abdomen or pelvis. 7. Gallbladder distension which is nonspecific. Correlate with right upper quadrant symptoms and possibly right upper quadrant ultrasound. 8.  Tiny hiatal hernia. 9. Areas of superior segment left lower lobe and left lower lobe mild ground-glass and airspace opacity are nonspecific but could be radiation induced. Given they are new since the prior exam, areas of infection cannot be excluded. Electronically Signed   By: Abigail Miyamoto M.D.   On: 02/05/2017 14:24    Discharge  Exam: Vitals:   02/07/17 2021 02/08/17 0518  BP: (!) 143/81 134/87  Pulse: 79 85  Resp: 18 18  Temp: 98.5 F (36.9 C) 98.4 F (36.9 C)   Vitals:   02/07/17 2021 02/07/17 2040 02/08/17 0518 02/08/17 0839  BP: (!) 143/81  134/87   Pulse: 79  85   Resp: 18  18   Temp: 98.5 F (36.9 C)  98.4 F (36.9 C)   TempSrc: Oral  Oral   SpO2: 97% 97% 97% 94%  Weight:      Height:        General: Pt is alert, awake, not in acute distress Cardiovascular: RRR, S1/S2 +, no rubs, no gallops Respiratory: CTA bilaterally, no wheezing, no rhonchi Abdominal: Soft, NT, ND, bowel sounds + Extremities: no edema, no cyanosis   The results of significant diagnostics from this hospitalization (including imaging, microbiology, ancillary and laboratory) are listed below for reference.     Microbiology: Recent Results (from the past 240 hour(s))  Culture, blood (Routine x 2)     Status: None (Preliminary result)   Collection Time: 02/05/17 11:40 AM  Result Value Ref Range Status   Specimen Description BLOOD PORTA CATH  Final   Special Requests   Final    BOTTLES DRAWN AEROBIC AND ANAEROBIC Blood Culture adequate volume   Culture   Final    NO GROWTH 2 DAYS Performed at [facility], [address], [place], [place] [address]    Report Status PENDING  Incomplete  Culture, blood (Routine x 2)     Status: Abnormal   Collection Time: 02/05/17 11:51 AM  Result Value Ref Range Status   Specimen Description BLOOD BLOOD RIGHT HAND  Final   Special Requests   Final    BOTTLES DRAWN AEROBIC AND ANAEROBIC Blood Culture adequate volume   Culture  Setup Time   Final    GRAM POSITIVE COCCI IN CLUSTERS AEROBIC BOTTLE ONLY CRITICAL RESULT CALLED TO, READ BACK BY AND VERIFIED WITH: D. Zeigler Pharm.D. 11:10 02/06/17 ([email or web address])    Culture (A)  Final    STAPHYLOCOCCUS SPECIES (COAGULASE NEGATIVE) THE SIGNIFICANCE OF ISOLATING THIS ORGANISM FROM A SINGLE SET OF BLOOD CULTURES WHEN MULTIPLE SETS ARE DRAWN  IS UNCERTAIN. PLEASE NOTIFY THE MICROBIOLOGY DEPARTMENT WITHIN ONE WEEK IF SPECIATION AND SENSITIVITIES ARE REQUIRED. Performed at [facility], [place] [address], [place], [place] [address]  Report Status 02/08/2017 FINAL  Final  Blood Culture ID Panel (Reflexed)     Status: Abnormal   Collection Time: 02/05/17 11:51 AM  Result Value Ref Range Status   Enterococcus species NOT DETECTED NOT DETECTED Final   Listeria monocytogenes NOT DETECTED NOT DETECTED Final   Staphylococcus species DETECTED (A) NOT DETECTED Final    Comment: Methicillin (oxacillin) resistant coagulase negative staphylococcus. Possible blood culture contaminant (unless isolated from more than one blood culture draw or clinical case suggests pathogenicity). No antibiotic treatment is indicated for blood  culture contaminants. CRITICAL RESULT CALLED TO, READ BACK BY AND VERIFIED WITH: D. Zeigler Pharm.D. 11:10 02/06/17 ([email or web address])    Staphylococcus aureus NOT DETECTED NOT DETECTED Final   Methicillin resistance DETECTED (A) NOT DETECTED Final    Comment: CRITICAL RESULT CALLED TO, READ BACK BY AND VERIFIED WITH: D. Zeigler Pharm.D. 11:10 02/06/17 ([email or web address])    Streptococcus species NOT DETECTED NOT DETECTED Final   Streptococcus agalactiae NOT DETECTED NOT DETECTED Final   Streptococcus pneumoniae NOT DETECTED NOT DETECTED Final   Streptococcus pyogenes NOT DETECTED NOT DETECTED Final   Acinetobacter baumannii NOT DETECTED NOT DETECTED Final   Enterobacteriaceae species NOT DETECTED NOT DETECTED Final   Enterobacter cloacae complex NOT DETECTED NOT DETECTED Final   Escherichia coli NOT DETECTED NOT DETECTED Final   Klebsiella oxytoca NOT DETECTED NOT DETECTED Final   Klebsiella pneumoniae NOT DETECTED NOT DETECTED Final   Proteus species NOT DETECTED NOT DETECTED Final   Serratia marcescens NOT DETECTED NOT DETECTED Final   Haemophilus influenzae NOT DETECTED NOT DETECTED Final   Neisseria meningitidis NOT DETECTED  NOT DETECTED Final   Pseudomonas aeruginosa NOT DETECTED NOT DETECTED Final   Candida albicans NOT DETECTED NOT DETECTED Final   Candida glabrata NOT DETECTED NOT DETECTED Final   Candida krusei NOT DETECTED NOT DETECTED Final   Candida parapsilosis NOT DETECTED NOT DETECTED Final   Candida tropicalis NOT DETECTED NOT DETECTED Final    Comment: Performed at [facility], [place] [address] [place], [place] [address]  MRSA PCR Screening     Status: None   Collection Time: 02/05/17  6:52 PM  Result Value Ref Range Status   MRSA by PCR NEGATIVE NEGATIVE Final    Comment:        The GeneXpert MRSA Assay (FDA approved for NASAL specimens only), is one component of a comprehensive MRSA colonization surveillance program. It is not intended to diagnose MRSA infection nor to guide or monitor treatment for MRSA infections.      Labs: BNP (last 3 results) No results for input(s): BNP in the last 8760 hours. Basic Metabolic Panel:  Recent Labs Lab 02/03/17 0920 02/05/17 1137 02/05/17 2027 02/06/17 0329 02/06/17 1653 02/07/17 0345 02/08/17 0513  NA 141 142  --  142  --  139 140  K 4.0 3.2*  --  2.9* 2.8* 2.7* 3.6  CL  --  105  --  106  --  104 104  CO2 26 23  --  24  --  25 27  GLUCOSE 107 85  --  84  --  93 116*  BUN 7.1 9  --  6  --  <5* <5*  CREATININE 0.6 0.46 0.47 0.38*  --  0.34* 0.41*  CALCIUM 9.7 8.8*  --  8.2*  --  8.0* 8.5*  MG  --   --   --  1.8 1.8  --  2.0   Liver Function Tests:  Recent Labs Lab  02/03/17 0920 02/05/17 1137 02/06/17 0329  AST 9 16 12*  ALT <6 8* 8*  ALKPHOS 142 115 92  BILITOT <0.22 0.3 0.8  PROT 6.8 6.6 6.1*  ALBUMIN 2.8* 3.1* 2.9*   No results for input(s): LIPASE, AMYLASE in the last 168 hours. No results for input(s): AMMONIA in the last 168 hours. CBC:  Recent Labs Lab 02/03/17 0920 02/05/17 1137 02/05/17 2027 02/06/17 0329 02/07/17 0345  WBC 10.1 8.1 12.1* 9.4 9.5  NEUTROABS 8.3* 5.9  --   --  7.1  HGB 12.2 11.6*  11.1* 10.6* 10.5*  HCT 36.6 34.8* 34.1* 32.4* 31.8*  MCV 95.0 94.3 96.1 95.6 95.5  PLT 298 392 387 350 344   Cardiac Enzymes: No results for input(s): CKTOTAL, CKMB, CKMBINDEX, TROPONINI in the last 168 hours. BNP: Invalid input(s): POCBNP CBG: No results for input(s): GLUCAP in the last 168 hours. D-Dimer No results for input(s): DDIMER in the last 72 hours. Hgb A1c No results for input(s): HGBA1C in the last 72 hours. Lipid Profile No results for input(s): CHOL, HDL, LDLCALC, TRIG, CHOLHDL, LDLDIRECT in the last 72 hours. Thyroid function studies No results for input(s): TSH, T4TOTAL, T3FREE, THYROIDAB in the last 72 hours.  Invalid input(s): FREET3 Anemia work up No results for input(s): VITAMINB12, FOLATE, FERRITIN, TIBC, IRON, RETICCTPCT in the last 72 hours. Urinalysis    Component Value Date/Time   COLORURINE YELLOW 02/05/2017 1353   APPEARANCEUR CLEAR 02/05/2017 1353   LABSPEC 1.023 02/05/2017 1353   PHURINE 5.0 02/05/2017 1353   GLUCOSEU NEGATIVE 02/05/2017 1353   HGBUR NEGATIVE 02/05/2017 1353   BILIRUBINUR NEGATIVE 02/05/2017 1353   KETONESUR 20 (A) 02/05/2017 1353   PROTEINUR NEGATIVE 02/05/2017 1353   NITRITE NEGATIVE 02/05/2017 1353   LEUKOCYTESUR NEGATIVE 02/05/2017 1353   Sepsis Labs Invalid input(s): PROCALCITONIN,  WBC,  LACTICIDVEN Microbiology Recent Results (from the past 240 hour(s))  Culture, blood (Routine x 2)     Status: None (Preliminary result)   Collection Time: 02/05/17 11:40 AM  Result Value Ref Range Status   Specimen Description BLOOD PORTA CATH  Final   Special Requests   Final    BOTTLES DRAWN AEROBIC AND ANAEROBIC Blood Culture adequate volume   Culture   Final    NO GROWTH 2 DAYS Performed at [facility], [place] [address], [place], [place] [address]    Report Status PENDING  Incomplete  Culture, blood (Routine x 2)     Status: Abnormal   Collection Time: 02/05/17 11:51 AM  Result Value Ref Range Status   Specimen  Description BLOOD BLOOD RIGHT HAND  Final   Special Requests   Final    BOTTLES DRAWN AEROBIC AND ANAEROBIC Blood Culture adequate volume   Culture  Setup Time   Final    GRAM POSITIVE COCCI IN CLUSTERS AEROBIC BOTTLE ONLY CRITICAL RESULT CALLED TO, READ BACK BY AND VERIFIED WITH: D. Zeigler Pharm.D. 11:10 02/06/17 ([email or web address])    Culture (A)  Final    STAPHYLOCOCCUS SPECIES (COAGULASE NEGATIVE) THE SIGNIFICANCE OF ISOLATING THIS ORGANISM FROM A SINGLE SET OF BLOOD CULTURES WHEN MULTIPLE SETS ARE DRAWN IS UNCERTAIN. PLEASE NOTIFY THE MICROBIOLOGY DEPARTMENT WITHIN ONE WEEK IF SPECIATION AND SENSITIVITIES ARE REQUIRED. Performed at [facility], [place] [address] [place], [place] [address]    Report Status 02/08/2017 FINAL  Final  Blood Culture ID Panel (Reflexed)     Status: Abnormal   Collection Time: 02/05/17 11:51 AM  Result Value Ref Range Status  Enterococcus species NOT DETECTED NOT DETECTED Final   Listeria monocytogenes NOT DETECTED NOT DETECTED Final   Staphylococcus species DETECTED (A) NOT DETECTED Final    Comment: Methicillin (oxacillin) resistant coagulase negative staphylococcus. Possible blood culture contaminant (unless isolated from more than one blood culture draw or clinical case suggests pathogenicity). No antibiotic treatment is indicated for blood  culture contaminants. CRITICAL RESULT CALLED TO, READ BACK BY AND VERIFIED WITH: D. Zeigler Pharm.D. 11:10 02/06/17 ([email or web address])    Staphylococcus aureus NOT DETECTED NOT DETECTED Final   Methicillin resistance DETECTED (A) NOT DETECTED Final    Comment: CRITICAL RESULT CALLED TO, READ BACK BY AND VERIFIED WITH: D. Zeigler Pharm.D. 11:10 02/06/17 ([email or web address])    Streptococcus species NOT DETECTED NOT DETECTED Final   Streptococcus agalactiae NOT DETECTED NOT DETECTED Final   Streptococcus pneumoniae NOT DETECTED NOT DETECTED Final   Streptococcus pyogenes NOT DETECTED NOT DETECTED Final   Acinetobacter baumannii NOT  DETECTED NOT DETECTED Final   Enterobacteriaceae species NOT DETECTED NOT DETECTED Final   Enterobacter cloacae complex NOT DETECTED NOT DETECTED Final   Escherichia coli NOT DETECTED NOT DETECTED Final   Klebsiella oxytoca NOT DETECTED NOT DETECTED Final   Klebsiella pneumoniae NOT DETECTED NOT DETECTED Final   Proteus species NOT DETECTED NOT DETECTED Final   Serratia marcescens NOT DETECTED NOT DETECTED Final   Haemophilus influenzae NOT DETECTED NOT DETECTED Final   Neisseria meningitidis NOT DETECTED NOT DETECTED Final   Pseudomonas aeruginosa NOT DETECTED NOT DETECTED Final   Candida albicans NOT DETECTED NOT DETECTED Final   Candida glabrata NOT DETECTED NOT DETECTED Final   Candida krusei NOT DETECTED NOT DETECTED Final   Candida parapsilosis NOT DETECTED NOT DETECTED Final   Candida tropicalis NOT DETECTED NOT DETECTED Final    Comment: Performed at [facility], [place] [address], [place], [place] [address]  MRSA PCR Screening     Status: None   Collection Time: 02/05/17  6:52 PM  Result Value Ref Range Status   MRSA by PCR NEGATIVE NEGATIVE Final    Comment:        The GeneXpert MRSA Assay (FDA approved for NASAL specimens only), is one component of a comprehensive MRSA colonization surveillance program. It is not intended to diagnose MRSA infection nor to guide or monitor treatment for MRSA infections.      Time coordinating discharge: 35 minutes  SIGNED:  Chipper Oman, MD  [facility] 02/08/2017, 11:44 AM  Pager please text page via  [email or web address] Password [email or web address]

## 2017-02-09 ENCOUNTER — Telehealth: Payer: Self-pay | Admitting: Medical Oncology

## 2017-02-09 NOTE — Telephone Encounter (Signed)
I told her to keep appts

## 2017-02-10 ENCOUNTER — Ambulatory Visit (HOSPITAL_BASED_OUTPATIENT_CLINIC_OR_DEPARTMENT_OTHER): Payer: Medicare Other | Admitting: Nurse Practitioner

## 2017-02-10 ENCOUNTER — Telehealth: Payer: Self-pay | Admitting: Nurse Practitioner

## 2017-02-10 ENCOUNTER — Other Ambulatory Visit: Payer: Medicare Other

## 2017-02-10 ENCOUNTER — Ambulatory Visit: Payer: Medicare Other

## 2017-02-10 DIAGNOSIS — Z5111 Encounter for antineoplastic chemotherapy: Secondary | ICD-10-CM

## 2017-02-10 DIAGNOSIS — Z95828 Presence of other vascular implants and grafts: Secondary | ICD-10-CM

## 2017-02-10 DIAGNOSIS — C3492 Malignant neoplasm of unspecified part of left bronchus or lung: Secondary | ICD-10-CM

## 2017-02-10 DIAGNOSIS — C3490 Malignant neoplasm of unspecified part of unspecified bronchus or lung: Secondary | ICD-10-CM | POA: Diagnosis not present

## 2017-02-10 DIAGNOSIS — F411 Generalized anxiety disorder: Secondary | ICD-10-CM

## 2017-02-10 LAB — CULTURE, BLOOD (ROUTINE X 2)
CULTURE: NO GROWTH
SPECIAL REQUESTS: ADEQUATE

## 2017-02-10 MED ORDER — ALPRAZOLAM 0.25 MG PO TABS: 0.2500 mg | ORAL_TABLET | Freq: Two times a day (BID) | ORAL | 0 refills | Status: DC | PRN

## 2017-02-10 NOTE — Telephone Encounter (Signed)
Scheduled appt per 7/17 los - scheduled appts on 7/30 due to MM availability . Gave patient AVS and calender per los.

## 2017-02-10 NOTE — Progress Notes (Signed)
[facility] OFFICE PROGRESS NOTE    DIAGNOSIS: Stage IV (T2a, N0, M1a) non-small cell lung cancer, adenocarcinoma presented with bilateral pulmonary lesions. This could be also consider as synchronous primary tumors.  PRIOR THERAPY:  1) Systemic chemotherapy with carboplatin for AUC of 5 and Alimta 500 MG/M2 every 3 weeks. Status post 6 cycles. 2) Treatment according to the BMS checkmate 370 clinical trial. Randomized to group A arm B1 treatment with pemetrexed (Alimta) 500 mg/m given every 3 weeks. Status post 6 cycles discontinued secondary to disease progression. 3) Tecentriq 1200 mg IV every 3 weeks status post 6 cycles discontinued secondary to significant skin rash. 4) palliative radiotherapy to the left upper lobe lung mass under the care of Dr. Lisbeth Renshaw completed on 11/16/2015.  CURRENT THERAPY: Systemic chemotherapy with docetaxel 75 MG/M2 and Cyramza 10 MG/KG every 3 weeks with Neulasta support. First dose 08/25/2016. Status post 8 cycles. Starting from cycle #6, docetaxel dose reduced to 65 MG/M2 secondary to intolerance.   INTERVAL HISTORY:   Heather Banks returns as scheduled. She completed cycle 8 Taxotere/ramucirumab 01/20/2017. She was hospitalized 02/05/2017 through 02/08/2017 with a productive cough, shortness of breath, confusion/lethargy. She was found to have pneumonia on CT. She was initially treated with IV antibiotics and was discharged home on a 10 day course of oral antibiotics.  She continues to feel weak. The confusion is much better. No fever. Persistent cough. She denies shortness of breath. No nausea or vomiting. No constipation or diarrhea.  Objective:  Vital signs in last 24 hours:  Blood pressure 110/72, pulse 100, temperature 98.6 F (37 C), temperature source Oral, resp. rate 18, height 5\' 5"  (1.651 m), weight 168 lb 14.4 oz (76.6 kg), SpO2 98 %.    HEENT: No thrush or ulcers. Resp: Distant breath sounds. No respiratory distress. Cardio:  Regular rate and rhythm. GI: Abdomen is soft. No hepatomegaly. Vascular: No significant leg edema. Calves soft and nontender. Neuro: Alert and oriented.  Skin: Ecchymoses scattered over lower lower arms. Port-A-Cath without erythema.    Lab Results:  Lab Results  Component Value Date   WBC 9.5 02/07/2017   HGB 10.5 (L) 02/07/2017   HCT 31.8 (L) 02/07/2017   MCV 95.5 02/07/2017   PLT 344 02/07/2017   NEUTROABS 7.1 02/07/2017    Imaging:  No results found.  Medications: I have reviewed the patient's current medications.  Assessment/Plan: 1. Stage IV non-small cell lung cancer, adenocarcinoma status post several regimens of systemic chemotherapy as well as immunotherapy. Currently undergoing systemic chemotherapy with reduced dose docetaxel 65 MG/M2 and Cyramza 10 MG/KG every 3 weeks status post 8 cycles. CT scan 02/05/2017 with similar right upper lobe pulmonary nodules and left upper lobe/apical masslike consolidation. Right middle lobe groundglass opacity slightly decreased. No thoracic adenopathy. No evidence of metastatic disease in the abdomen or pelvis. 2. Hospitalization 02/05/2017 through 02/08/2017 with pneumonia currently completing oral antibiotics.   Disposition: Heather Banks appears stable. She has completed 8 cycles of Taxotere/cyramza. She is scheduled for cycle 9 today. She was hospitalized recently with pneumonia and is slowly recovering. We decided to hold today's treatment. We discussed returning for a follow-up appointment and possible chemotherapy in one week. She would like to return in 2 weeks for a follow-up visit and possible treatment that day. She will return for follow-up on 02/24/2017. She will contact the office in the interim with any problems.    Ned Card ANP/GNP-BC   02/10/2017  9:47 AM

## 2017-02-16 ENCOUNTER — Other Ambulatory Visit: Payer: Self-pay | Admitting: Medical Oncology

## 2017-02-16 DIAGNOSIS — C3492 Malignant neoplasm of unspecified part of left bronchus or lung: Secondary | ICD-10-CM

## 2017-02-17 ENCOUNTER — Ambulatory Visit (INDEPENDENT_AMBULATORY_CARE_PROVIDER_SITE_OTHER): Payer: Medicare Other | Admitting: Family Medicine

## 2017-02-17 ENCOUNTER — Other Ambulatory Visit: Payer: Medicare Other

## 2017-02-17 ENCOUNTER — Encounter: Payer: Self-pay | Admitting: Family Medicine

## 2017-02-17 VITALS — BP 100/60 | HR 100 | Temp 97.6°F | Resp 12 | Ht 65.0 in | Wt 164.2 lb

## 2017-02-17 DIAGNOSIS — Z659 Problem related to unspecified psychosocial circumstances: Secondary | ICD-10-CM

## 2017-02-17 DIAGNOSIS — R419 Unspecified symptoms and signs involving cognitive functions and awareness: Secondary | ICD-10-CM

## 2017-02-17 DIAGNOSIS — C3492 Malignant neoplasm of unspecified part of left bronchus or lung: Secondary | ICD-10-CM | POA: Diagnosis not present

## 2017-02-17 DIAGNOSIS — G8929 Other chronic pain: Secondary | ICD-10-CM

## 2017-02-17 DIAGNOSIS — Z8659 Personal history of other mental and behavioral disorders: Secondary | ICD-10-CM

## 2017-02-17 DIAGNOSIS — W19XXXA Unspecified fall, initial encounter: Secondary | ICD-10-CM

## 2017-02-17 DIAGNOSIS — M549 Dorsalgia, unspecified: Secondary | ICD-10-CM | POA: Diagnosis not present

## 2017-02-17 LAB — POCT URINALYSIS DIP (DEVICE)
BILIRUBIN URINE: NEGATIVE
GLUCOSE, UA: NEGATIVE mg/dL
HGB URINE DIPSTICK: NEGATIVE
Ketones, ur: NEGATIVE mg/dL
LEUKOCYTES UA: NEGATIVE
NITRITE: NEGATIVE
Protein, ur: 100 mg/dL — AB
UROBILINOGEN UA: 0.2 mg/dL (ref 0.0–1.0)
pH: 5.5 (ref 5.0–8.0)

## 2017-02-17 MED ORDER — TRAMADOL HCL 50 MG PO TABS: 100.0000 mg | ORAL_TABLET | Freq: Four times a day (QID) | ORAL | 0 refills | Status: DC | PRN

## 2017-02-17 NOTE — Progress Notes (Signed)
Patient ID: Heather Banks, female    DOB: 1950/08/27, 66 y.o.   MRN: [id number]  PCP: Scot Jun, FNP  Chief Complaint  Patient presents with  . Hospitalization Follow-up    Subjective:  HPI Heather Banks is a 66 y.o. female presents to reestablish care and for a hospital follow-up.  Medical history includes: Adenocarcinoma of the left lung stage IV, generalized anxiety, depression, COPD He resides at home with her 40 year old mother. Her daughter is present today at this visit. Heather Banks was recently hospitalized 02/05/2017 for hospital acquired pneumonia and was found to be hypoxic with SPO2 use in the 80's. Upon arrival at the ED she was also found to have altered mentation. She was hospitalized for total of 3 days and then discharged home.  Today her daughter reports that she has had continued episodes of incoherent , behavioral changes as far as being angry and verbally aggressive, and disoriented. At one point her pulse oximetry were checked at home and her sats were in the upper 80's. However daughter reports that the patient was lying down flat on her saturations were checked at home.  Patient has also suffered from multiple falls and her 65 year old mother attempted to help patient get up and injured herself during the process. Family is concerned and would like Heather Banks to go to a rehabilitation facility for short period of time. Heather Banks does not want to go to a facility however her 11 year old mother, with whom she resides with, will not allow home health to come in to assist with ADLs and provide in-home physical therapy. Social History   Social History  . Marital status: Widowed    Spouse name: N/A  . Number of children: N/A  . Years of education: N/A   Occupational History  . Not on file.   Social History Main Topics  . Smoking status: Former Smoker    Packs/day: 0.25    Years: 44.00    Types: Cigarettes    Quit date: 03/14/2016  . Smokeless tobacco: Never Used   . Alcohol use No     Comment: no alcohol in 7 yrs   . Drug use: No  . Sexual activity: Not on file   Other Topics Concern  . Not on file   Social History Narrative  . No narrative on file    Family History  Problem Relation Age of Onset  . COPD Father    Review of Systems HPI  Patient Active Problem List   Diagnosis Date Noted  . Postobstructive pneumonia 02/05/2017  . Acute respiratory failure ([place]) 02/05/2017  . Elevated blood pressure reading   . COPD exacerbation ([place])   . Hyperglycemia 05/18/2016  . Abnormal TSH 05/18/2016  . Dyspnea   . Encounter for palliative care   . Compression fracture of thoracic vertebra ([facility]) 04/21/2016  . Vertebral fracture, pathological 02/28/2016  . GERD (gastroesophageal reflux disease) 02/28/2016  . Acute respiratory failure with hypoxia ([place]) 02/28/2016  . COPD with acute exacerbation ([place]) 02/28/2016  . History of pulmonary embolus ([facility]) 02/06/2016  . Respiratory failure ([place]) 01/18/2016  . Primary cancer of left upper lobe of lung ([place]) 10/24/2015  . Drug-induced skin rash 07/31/2015  . Goals of care, counseling/discussion 06/19/2015  . Chronic fatigue 05/22/2015  . Nausea without vomiting 05/22/2015  . Full code status 05/01/2015  . COPD (chronic obstructive pulmonary disease) ([place]) 02/05/2015  . Encounter for antineoplastic chemotherapy 01/21/2015  . Depression (emotion) 09/05/2014  . Generalized anxiety disorder 08/21/2014  .  Smoking 07/11/2014  . Adenocarcinoma of left lung, stage 4 ([place]) 07/09/2014    Allergies  Allergen Reactions  . Ativan [Lorazepam] Other (See Comments)    Reaction:  Makes pt angry - opposite reaction of med should do  . Cinnamon Other (See Comments)    Seasonal scent trigger migraine as well as pt's overall immune system    Prior to Admission medications   Medication Sig Start Date End Date Taking? Authorizing Provider  ALPRAZolam (XANAX) 0.25 MG tablet Take 1 tablet (0.25 mg total) by mouth 2  (two) times daily as needed for anxiety. 02/10/17  Yes Owens Shark, NP  amLODipine (NORVASC) 5 MG tablet Take 1 tablet (5 mg total) by mouth daily. 09/15/16  Yes Micheline Chapman, NP  dexamethasone (DECADRON) 4 MG tablet 2 tablets by mouth twice a day the day before, day of and day after the chemotherapy every 3 weeks. 01/20/17  Yes Curt Bears, MD  hydroxypropyl methylcellulose / hypromellose (ISOPTO TEARS / GONIOVISC) 2.5 % ophthalmic solution 1 drop.   Yes [provider]  Lidocaine (ASPERCREME LIDOCAINE) 4 % PTCH Apply 1 patch topically daily as needed. Place on back as needed for pain   Yes [provider]  lidocaine-prilocaine (EMLA) cream Apply 1 application topically as needed. Apply 1 tsp over port site 1-1.5 hours prior to chemotherapy. 10/07/16  Yes Curt Bears, MD  loratadine (CLARITIN) 10 MG tablet Take 1 tablet (10 mg total) by mouth daily. 06/03/16  Yes Micheline Chapman, NP  mirtazapine (REMERON) 30 MG tablet Take 0.5 tablets (15 mg total) by mouth at bedtime as needed (for sleep). 09/16/16  Yes Curt Bears, MD  ondansetron (ZOFRAN) 8 MG tablet Take 1 tablet (8 mg total) by mouth every 8 (eight) hours as needed for nausea or vomiting. 08/29/16  Yes Gorsuch, Ni, MD  umeclidinium-vilanterol (ANORO ELLIPTA) 62.5-25 MCG/INH AEPB Inhale 1 puff into the lungs daily. 11/10/16  Yes Rigoberto Noel, MD  VENTOLIN HFA 108 (90 Base) MCG/ACT inhaler INHALE 2 PUFFS BY MOUTH EVERY 6 HOURS AS NEEDED FOR WHEEZE OR SHORTNESS OF BREATH 08/11/16  Yes Rigoberto Noel, MD  acidophilus (RISAQUAD) CAPS capsule Take 1 capsule by mouth daily.    [provider]  calcium carbonate (OS-CAL - DOSED IN MG OF ELEMENTAL CALCIUM) 1250 (500 Ca) MG tablet Take 1 tablet by mouth daily with breakfast.    [provider]  cholecalciferol (VITAMIN D) 1000 units tablet Take 1,000 Units by mouth daily.    [provider]  cyanocobalamin 2000 MCG tablet Take 2,000 mcg by  mouth daily.    [provider]  diphenhydrAMINE (BENADRYL) 25 mg capsule Take 25 mg by mouth every 6 (six) hours as needed for itching. Reported on 01/19/2016    [provider]  famotidine (PEPCID) 20 MG tablet Take 1 tablet (20 mg total) by mouth daily. Patient not taking: Reported on 02/17/2017 05/24/16   Rosita Fire, MD  potassium chloride SA (K-DUR,KLOR-CON) 20 MEQ tablet Take 1 tablet (20 mEq total) by mouth daily. Patient not taking: Reported on 02/17/2017 02/08/17   Patrecia Pour, Christean Grief, MD  prochlorperazine (COMPAZINE) 10 MG tablet Take 1 tablet (10 mg total) by mouth every 6 (six) hours as needed for nausea or vomiting. Patient not taking: Reported on 02/17/2017 01/20/17   Curt Bears, MD    Past Medical, Surgical Family and Social History reviewed and updated.    Objective:   Today's Vitals   02/17/17 1021  BP: 100/60  Pulse: 100  Resp: 12  Temp: 97.6 F (36.4 C)  TempSrc: Oral  SpO2: 96%  Weight: 164 lb 3.2 oz (74.5 kg)  Height: 5\' 5"  (1.651 m)    Wt Readings from Last 3 Encounters:  02/17/17 164 lb 3.2 oz (74.5 kg)  02/10/17 168 lb 14.4 oz (76.6 kg)  02/05/17 169 lb 1.5 oz (76.7 kg)   Physical Exam  Constitutional: She is oriented to person, place, and time. She appears well-developed and well-nourished.  HENT:  Head: Normocephalic and atraumatic.  Nose: Nose normal.  Mouth/Throat: Oropharynx is clear and moist.  Eyes: Pupils are equal, round, and reactive to light. Conjunctivae are normal.  Neck: Normal range of motion. Neck supple. No thyromegaly present.  Cardiovascular: Normal rate, regular rhythm, normal heart sounds and intact distal pulses.   Pulmonary/Chest: Effort normal. She has decreased breath sounds in the right upper field, the right middle field, the right lower field, the left upper field, the left middle field and the left lower field. She has no wheezes. She has no rhonchi. She has no rales.  Neurological: She is  alert and oriented to person, place, and time. She displays no tremor. She exhibits abnormal muscle tone. She displays no seizure activity. Coordination and gait abnormal. GCS eye subscore is 4. GCS verbal subscore is 5. GCS motor subscore is 6.  Antalgic gait   Skin: Skin is warm and dry.  Psychiatric: She has a normal mood and affect. Her behavior is normal. Judgment and thought content normal.   Assessment & Plan:  1. Fall, initial encounter 2. Confusion, hx of, without neuro findings 3. Cognitive safety issue 4. Adenocarcinoma of left lung, stage 4 ([place]) 5. Chronic Bilateral Back Pain, unspecified back location  Discussed at length with patient and daughter that I would recommend for patient to receive short-term rehabilitation services whether it be in home or at a skilled nursing facility. Patient is at risk for injury due to unexplained falls. Her CT of the head was negative of any concerning findings. Her gait is imbalance during today's visit, she appears weak, and deconditioned. Advised daughter to discuss with other family members and locate a facility and I'll be happy to complete any necessary paperwork to assist with this transition. Patient at the end of visit was in agreement to go to a skilled nursing facility for short period of time for physical rehabilitation and to ensure her personal safety.  For chronic back pain likely related to stage 4 cancer, I am prescribing tramadol 100 mg every 6 hours as needed for pain. Due to recent altered mental status, I am refraining from prescribing anything stronger for pain.  RTC: 1 month for follow-up chronic disease management. Family will notify me once a facility has been located for completion of SNF paperwork.   Carroll Sage. Kenton Kingfisher, MSN, FNP-C [facility]  [address] [place], [place] [address] [phone]

## 2017-02-17 NOTE — Patient Instructions (Addendum)
Please let me know once you have located a skilled nursing facility and I will complete any necessary paperwork for admission into the facility.  I am prescribing tramadol 100 mg every 6 hours for pain.    Chronic Back Pain When back pain lasts longer than 3 months, it is called chronic back pain.The cause of your back pain may not be known. Some common causes include:  Wear and tear (degenerative disease) of the bones, ligaments, or disks in your back.  Inflammation and stiffness in your back (arthritis).  People who have chronic back pain often go through certain periods in which the pain is more intense (flare-ups). Many people can learn to manage the pain with home care. Follow these instructions at home: Pay attention to any changes in your symptoms. Take these actions to help with your pain: Activity  Avoid bending and activities that make the problem worse.  Do not sit or stand in one place for long periods of time.  Take brief periods of rest throughout the day. This will reduce your pain. Resting in a lying or standing position is usually better than sitting to rest.  When you are resting for longer periods, mix in some mild activity or stretching between periods of rest. This will help to prevent stiffness and pain.  Get regular exercise. Ask your health care provider what activities are safe for you.  Do not lift anything that is heavier than 10 lb (4.5 kg). Always use proper lifting technique, which includes: ? Bending your knees. ? Keeping the load close to your body. ? Avoiding twisting. Managing pain  If directed, apply ice to the painful area. Your health care provider may recommend applying ice during the first 24-48 hours after a flare-up begins. ? Put ice in a plastic bag. ? Place a towel between your skin and the bag. ? Leave the ice on for 20 minutes, 2-3 times per day.  After icing, apply heat to the affected area as often as told by your health care  provider. Use the heat source that your health care provider recommends, such as a moist heat pack or a heating pad. ? Place a towel between your skin and the heat source. ? Leave the heat on for 20-30 minutes. ? Remove the heat if your skin turns bright red. This is especially important if you are unable to feel pain, heat, or cold. You may have a greater risk of getting burned.  Try soaking in a warm tub.  Take over-the-counter and prescription medicines only as told by your health care provider.  Keep all follow-up visits as told by your health care provider. This is important. Contact a health care provider if:  You have pain that is not relieved with rest or medicine. Get help right away if:  You have weakness or numbness in one or both of your legs or feet.  You have trouble controlling your bladder or your bowels.  You have nausea or vomiting.  You have pain in your abdomen.  You have shortness of breath or you faint. This information is not intended to replace advice given to you by your health care provider. Make sure you discuss any questions you have with your health care provider. Document Released: 08/21/2004 Document Revised: 11/22/2015 Document Reviewed: 01/01/2015 Elsevier Interactive Patient Education  2018 [employer].

## 2017-02-20 ENCOUNTER — Other Ambulatory Visit: Payer: Self-pay

## 2017-02-20 DIAGNOSIS — C3492 Malignant neoplasm of unspecified part of left bronchus or lung: Secondary | ICD-10-CM

## 2017-02-23 ENCOUNTER — Ambulatory Visit (HOSPITAL_BASED_OUTPATIENT_CLINIC_OR_DEPARTMENT_OTHER): Payer: Medicare Other | Admitting: Internal Medicine

## 2017-02-23 ENCOUNTER — Telehealth: Payer: Self-pay

## 2017-02-23 ENCOUNTER — Other Ambulatory Visit (HOSPITAL_BASED_OUTPATIENT_CLINIC_OR_DEPARTMENT_OTHER): Payer: Medicare Other

## 2017-02-23 ENCOUNTER — Encounter: Payer: Self-pay | Admitting: Internal Medicine

## 2017-02-23 ENCOUNTER — Ambulatory Visit: Payer: Medicare Other

## 2017-02-23 DIAGNOSIS — Z95828 Presence of other vascular implants and grafts: Secondary | ICD-10-CM

## 2017-02-23 DIAGNOSIS — G8929 Other chronic pain: Secondary | ICD-10-CM

## 2017-02-23 DIAGNOSIS — C3412 Malignant neoplasm of upper lobe, left bronchus or lung: Secondary | ICD-10-CM

## 2017-02-23 DIAGNOSIS — F411 Generalized anxiety disorder: Secondary | ICD-10-CM

## 2017-02-23 DIAGNOSIS — E876 Hypokalemia: Secondary | ICD-10-CM | POA: Diagnosis not present

## 2017-02-23 DIAGNOSIS — C3492 Malignant neoplasm of unspecified part of left bronchus or lung: Secondary | ICD-10-CM

## 2017-02-23 DIAGNOSIS — Z5111 Encounter for antineoplastic chemotherapy: Secondary | ICD-10-CM

## 2017-02-23 DIAGNOSIS — M549 Dorsalgia, unspecified: Secondary | ICD-10-CM

## 2017-02-23 LAB — CBC WITH DIFFERENTIAL/PLATELET
BASO%: 0.4 % (ref 0.0–2.0)
Basophils Absolute: 0 10*3/uL (ref 0.0–0.1)
EOS%: 1 % (ref 0.0–7.0)
Eosinophils Absolute: 0.1 10*3/uL (ref 0.0–0.5)
HEMATOCRIT: 38.4 % (ref 34.8–46.6)
HGB: 12.3 g/dL (ref 11.6–15.9)
LYMPH%: 10.1 % — AB (ref 14.0–49.7)
MCH: 30.7 pg (ref 25.1–34.0)
MCHC: 32 g/dL (ref 31.5–36.0)
MCV: 95.8 fL (ref 79.5–101.0)
MONO#: 0.7 10*3/uL (ref 0.1–0.9)
MONO%: 8.2 % (ref 0.0–14.0)
NEUT%: 80.3 % — ABNORMAL HIGH (ref 38.4–76.8)
NEUTROS ABS: 6.8 10*3/uL — AB (ref 1.5–6.5)
Platelets: 477 10*3/uL — ABNORMAL HIGH (ref 145–400)
RBC: 4.01 10*6/uL (ref 3.70–5.45)
RDW: 17.9 % — ABNORMAL HIGH (ref 11.2–14.5)
WBC: 8.4 10*3/uL (ref 3.9–10.3)
lymph#: 0.9 10*3/uL (ref 0.9–3.3)
nRBC: 0 % (ref 0–0)

## 2017-02-23 LAB — COMPREHENSIVE METABOLIC PANEL
ALT: 8 U/L (ref 0–55)
AST: 15 U/L (ref 5–34)
Albumin: 3 g/dL — ABNORMAL LOW (ref 3.5–5.0)
Alkaline Phosphatase: 141 U/L (ref 40–150)
Anion Gap: 14 mEq/L — ABNORMAL HIGH (ref 3–11)
BUN: 14.1 mg/dL (ref 7.0–26.0)
CHLORIDE: 95 meq/L — AB (ref 98–109)
CO2: 30 meq/L — AB (ref 22–29)
CREATININE: 0.7 mg/dL (ref 0.6–1.1)
Calcium: 10.5 mg/dL — ABNORMAL HIGH (ref 8.4–10.4)
EGFR: >90 mL/min/{1.73_m2} (ref >90)
GLUCOSE: 108 mg/dL (ref 70–140)
Potassium: 2.8 mEq/L — CL (ref 3.5–5.1)
Sodium: 139 mEq/L (ref 136–145)
Total Bilirubin: <0.22 mg/dL (ref 0.20–1.20)
Total Protein: 6.7 g/dL (ref 6.4–8.3)

## 2017-02-23 LAB — UA PROTEIN, DIPSTICK - CHCC

## 2017-02-23 MED ORDER — POTASSIUM CHLORIDE CRYS ER 20 MEQ PO TBCR: 20.0000 meq | EXTENDED_RELEASE_TABLET | Freq: Two times a day (BID) | ORAL | 0 refills | Status: DC

## 2017-02-23 MED ORDER — ALPRAZOLAM 0.25 MG PO TABS: 0.2500 mg | ORAL_TABLET | Freq: Two times a day (BID) | ORAL | 0 refills | Status: DC | PRN

## 2017-02-23 MED ORDER — TRAMADOL HCL 50 MG PO TABS: 100.0000 mg | ORAL_TABLET | Freq: Four times a day (QID) | ORAL | 0 refills | Status: DC | PRN

## 2017-02-23 NOTE — Telephone Encounter (Signed)
appts made and avs printed per 7/30 los

## 2017-02-23 NOTE — Progress Notes (Signed)
[facility] [phone]   [phone]  OFFICE PROGRESS NOTE  Scot Jun, FNP [place] [address]  DIAGNOSIS: Stage IV (T2a, N0, M1a) non-small cell lung cancer, adenocarcinoma presented with bilateral pulmonary lesions. This could be also consider as synchronous primary tumors.  PRIOR THERAPY:  1) Systemic chemotherapy with carboplatin for AUC of 5 and Alimta 500 MG/M2 every 3 weeks. Status post 6 cycles. 2) Treatment according to the BMS checkmate 370 clinical trial. Randomized to group A arm B1 treatment with pemetrexed (Alimta) 500 mg/m given every 3 weeks. Status post 6 cycles discontinued secondary to disease progression. 3) Tecentriq 1200 mg IV every 3 weeks status post 6 cycles discontinued secondary to significant skin rash. 4) palliative radiotherapy to the left upper lobe lung mass under the care of Dr. Lisbeth Renshaw completed on 11/16/2015.  CURRENT THERAPY: Systemic chemotherapy with docetaxel 75 MG/M2 and Cyramza 10 MG/KG every 3 weeks with Neulasta support. First dose 08/25/2016. Status post 8 cycles. Starting from cycle #6, I would reduce the dose of docetaxel to 65 MG/M2 secondary to intolerance.  INTERVAL HISTORY: Heather Banks 66 y.o. female returns to the clinic today for follow-up visit accompanied by her daughter. The patient continues to complain of low back pain and weakness. She fell a few times recently. She had CT scan of the head that was unremarkable. She denied having any chest pain, shortness breath, cough or hemoptysis. She lost few pounds since her last visit. She denied having any fever or chills. She has no nausea, vomiting, diarrhea or constipation. She had repeat CT scan of the chest, abdomen and pelvis performed recently and she is here for evaluation and discussion of her scan results.   MEDICAL HISTORY: Past Medical History:  Diagnosis Date  . Adenocarcinoma of left lung, stage 4 ([facility]) 07/09/2014    . Compression fracture of thoracic vertebra ([facility]) 04/21/2016  . COPD (chronic obstructive pulmonary disease) ([facility])    Albuterol neb as needed;Pulmicort neb daily  . Encounter for antineoplastic chemotherapy 01/21/2015  . Full code status 05/01/2015  . GERD (gastroesophageal reflux disease)    takes Pantoprazole and Zantac daily  . History of bronchitis    >5 yrs ago  . History of migraine    last one 2 wks ago  . Hx of radiation therapy 10/29/2015 to 11/16/2015   The Left upper lobe was treated to 37.5 Gy in 15 fractions at 2.5 Gy per fraction  . Lung mass   . Panic attacks    but doesn't take any meds  . Pneumonia    hx of-last time about 4+yrs ago    ALLERGIES:  is allergic to ativan [lorazepam] and cinnamon.  MEDICATIONS:  Current Outpatient Prescriptions  Medication Sig Dispense Refill  . acidophilus (RISAQUAD) CAPS capsule Take 1 capsule by mouth daily.    Marland Kitchen ALPRAZolam (XANAX) 0.25 MG tablet Take 1 tablet (0.25 mg total) by mouth 2 (two) times daily as needed for anxiety. 40 tablet 0  . amLODipine (NORVASC) 5 MG tablet Take 1 tablet (5 mg total) by mouth daily. 90 tablet 1  . calcium carbonate (OS-CAL - DOSED IN MG OF ELEMENTAL CALCIUM) 1250 (500 Ca) MG tablet Take 1 tablet by mouth daily with breakfast.    . cholecalciferol (VITAMIN D) 1000 units tablet Take 1,000 Units by mouth daily.    . cyanocobalamin 2000 MCG tablet Take 2,000 mcg by mouth daily.    Marland Kitchen dexamethasone (DECADRON)  4 MG tablet 2 tablets by mouth twice a day the day before, day of and day after the chemotherapy every 3 weeks. 40 tablet 1  . diphenhydrAMINE (BENADRYL) 25 mg capsule Take 25 mg by mouth every 6 (six) hours as needed for itching. Reported on 01/19/2016    . famotidine (PEPCID) 20 MG tablet Take 1 tablet (20 mg total) by mouth daily. (Patient not taking: Reported on 02/17/2017) 15 tablet 0  . hydroxypropyl methylcellulose / hypromellose (ISOPTO TEARS / GONIOVISC) 2.5 % ophthalmic solution 1 drop.    .  Lidocaine (ASPERCREME LIDOCAINE) 4 % PTCH Apply 1 patch topically daily as needed. Place on back as needed for pain    . lidocaine-prilocaine (EMLA) cream Apply 1 application topically as needed. Apply 1 tsp over port site 1-1.5 hours prior to chemotherapy. 30 g 0  . loratadine (CLARITIN) 10 MG tablet Take 1 tablet (10 mg total) by mouth daily. 15 tablet 0  . mirtazapine (REMERON) 30 MG tablet Take 0.5 tablets (15 mg total) by mouth at bedtime as needed (for sleep). 30 tablet 2  . ondansetron (ZOFRAN) 8 MG tablet Take 1 tablet (8 mg total) by mouth every 8 (eight) hours as needed for nausea or vomiting. 20 tablet 0  . potassium chloride SA (K-DUR,KLOR-CON) 20 MEQ tablet Take 1 tablet (20 mEq total) by mouth daily. (Patient not taking: Reported on 02/17/2017) 10 tablet 0  . prochlorperazine (COMPAZINE) 10 MG tablet Take 1 tablet (10 mg total) by mouth every 6 (six) hours as needed for nausea or vomiting. (Patient not taking: Reported on 02/17/2017) 60 tablet 0  . traMADol (ULTRAM) 50 MG tablet Take 2 tablets (100 mg total) by mouth every 6 (six) hours as needed. 60 tablet 0  . umeclidinium-vilanterol (ANORO ELLIPTA) 62.5-25 MCG/INH AEPB Inhale 1 puff into the lungs daily. 30 each 5  . VENTOLIN HFA 108 (90 Base) MCG/ACT inhaler INHALE 2 PUFFS BY MOUTH EVERY 6 HOURS AS NEEDED FOR WHEEZE OR SHORTNESS OF BREATH 18 g 5   No current facility-administered medications for this visit.    Facility-Administered Medications Ordered in Other Visits  Medication Dose Route Frequency Provider Last Rate Last Dose  . heparin lock flush 100 unit/mL  500 Units Intracatheter Once PRN Curt Bears, MD      . sodium chloride 0.9 % injection 10 mL  10 mL Intracatheter PRN Curt Bears, MD   10 mL at 07/31/15 1635  . sodium chloride 0.9 % injection 10 mL  10 mL Intracatheter PRN Curt Bears, MD        SURGICAL HISTORY:  Past Surgical History:  Procedure Laterality Date  . ABDOMINAL HYSTERECTOMY    .  APPENDECTOMY    . IR GENERIC HISTORICAL  05/02/2016   IR RADIOLOGIST EVAL & MGMT 05/02/2016 [facility]  . IR GENERIC HISTORICAL  05/08/2016   IR VERTEBROPLASTY EA ADDL (T&LS) BX INC UNI/BIL INC INJECT/IMAGING 05/08/2016 Luanne Bras, MD [facility]  . IR GENERIC HISTORICAL  05/08/2016   IR VERTEBROPLASTY CERV/THOR BX INC UNI/BIL INC/INJECT/IMAGING 05/08/2016 Luanne Bras, MD [facility]  . IR GENERIC HISTORICAL  05/29/2016   IR RADIOLOGIST EVAL & MGMT 05/29/2016 [facility]  . OOPHORECTOMY    . PORTACATH PLACEMENT  jan. 2016  . TONSILLECTOMY    . VIDEO BRONCHOSCOPY WITH ENDOBRONCHIAL NAVIGATION N/A 08/14/2014   Procedure: VIDEO BRONCHOSCOPY WITH ENDOBRONCHIAL NAVIGATION;  Surgeon: Rigoberto Noel, MD;  Location: [facility];  Service: Thoracic;  Laterality: N/A;    REVIEW OF  SYSTEMS:  Constitutional: positive for anorexia, fatigue and weight loss Eyes: negative Ears, nose, mouth, throat, and face: negative Respiratory: negative Cardiovascular: negative Gastrointestinal: negative Genitourinary:negative Integument/breast: negative Hematologic/lymphatic: negative Musculoskeletal:positive for arthralgias and back pain Neurological: positive for headaches Behavioral/Psych: negative Endocrine: negative Allergic/Immunologic: negative   PHYSICAL EXAMINATION: General appearance: alert, cooperative, fatigued and no distress Head: Normocephalic, without obvious abnormality, atraumatic Neck: no adenopathy, no JVD, supple, symmetrical, trachea midline and thyroid not enlarged, symmetric, no tenderness/mass/nodules Lymph nodes: Cervical, supraclavicular, and axillary nodes normal. Resp: clear to auscultation bilaterally Back: symmetric, no curvature. ROM normal. No CVA tenderness. Cardio: regular rate and rhythm, S1, S2 normal, no murmur, click, rub or gallop GI: soft, non-tender; bowel sounds normal; no masses,  no organomegaly Extremities: extremities normal, atraumatic, no cyanosis  or edema Neurologic: Alert and oriented X 3, normal strength and tone. Normal symmetric reflexes. Normal coordination and gait   ECOG PERFORMANCE STATUS: 1 - Symptomatic but completely ambulatory  Blood pressure 121/77, pulse (!) 112, temperature 97.8 F (36.6 C), temperature source Oral, resp. rate 18, height 5\' 5"  (1.651 m), weight 160 lb (72.6 kg), SpO2 98 %.  LABORATORY DATA: Lab Results  Component Value Date   WBC 8.4 02/23/2017   HGB 12.3 02/23/2017   HCT 38.4 02/23/2017   MCV 95.8 02/23/2017   PLT 477 (H) 02/23/2017      Chemistry      Component Value Date/Time   NA 140 02/08/2017 0513   NA 141 02/03/2017 0920   K 3.6 02/08/2017 0513   K 4.0 02/03/2017 0920   CL 104 02/08/2017 0513   CO2 27 02/08/2017 0513   CO2 26 02/03/2017 0920   BUN <5 (L) 02/08/2017 0513   BUN 7.1 02/03/2017 0920   CREATININE 0.41 (L) 02/08/2017 0513   CREATININE 0.6 02/03/2017 0920      Component Value Date/Time   CALCIUM 8.5 (L) 02/08/2017 0513   CALCIUM 9.7 02/03/2017 0920   ALKPHOS 92 02/06/2017 0329   ALKPHOS 142 02/03/2017 0920   AST 12 (L) 02/06/2017 0329   AST 9 02/03/2017 0920   ALT 8 (L) 02/06/2017 0329   ALT <6 02/03/2017 0920   BILITOT 0.8 02/06/2017 0329   BILITOT <0.22 02/03/2017 0920       RADIOGRAPHIC STUDIES: Dg Chest 2 View  Result Date: 02/05/2017 CLINICAL DATA:  Weakness. Shortness of breath. History lung cancer. EXAM: CHEST  2 VIEW COMPARISON:  05/18/2016 FINDINGS: Lateral view degraded by patient arm position. Vertebral augmentation and upper thoracic levels. A right Port-A-Cath terminates at the low SVC or high right atrium. Patient rotated right on the frontal. Normal heart size. Atherosclerosis in the transverse aorta. No pleural effusion or pneumothorax. Right greater than left upper lobe opacities with primarily left-sided apical thickening. Diffuse peribronchial thickening. Clear lower lungs. IMPRESSION: Right greater than left upper lobe and apical opacities.  Given history of lung cancer, radiation therapy, and the appearance on prior CT, findings are at least partially favored to be due to scarring. However, especially at the right upper lobe, patchy pneumonia cannot be excluded. Short term radiographic follow-up in 2-3 days should be considered. Peribronchial thickening which may relate to chronic bronchitis or smoking. Aortic Atherosclerosis (ICD10-I70.0). Electronically Signed   By: Abigail Miyamoto M.D.   On: 02/05/2017 12:32   Ct Head Wo Contrast  Result Date: 02/05/2017 CLINICAL DATA:  Confusion, history lung cancer EXAM: CT HEAD WITHOUT CONTRAST TECHNIQUE: Contiguous axial images were obtained from the base of the skull through the  vertex without intravenous contrast. Sagittal and coronal MPR images reconstructed from axial data set. COMPARISON:  None FINDINGS: Brain: Normal ventricular morphology. No midline shift or mass effect. Normal appearance of brain parenchyma. No intracranial hemorrhage, mass lesion or evidence acute infarction. No extra-axial fluid collections. Vascular: Minimal atherosclerotic calcification within internal carotid arteries at skullbase Skull: Intact Sinuses/Orbits: Opacification within sphenoid sinus with mild sphenoid sinus osseous wall thickening. Concha bullosa of the middle turbinates. Other: N/A IMPRESSION: No acute intracranial abnormalities. Sphenoid sinus disease. Electronically Signed   By: Lavonia Dana M.D.   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 Persistent left-sided pain. Confusion. EXAM: CT ANGIOGRAPHY CHEST CT ABDOMEN AND PELVIS WITH CONTRAST TECHNIQUE: Multidetector CT imaging of the chest was performed using the standard protocol during bolus administration of intravenous contrast. Multiplanar CT image reconstructions and MIPs were obtained to evaluate the vascular anatomy. Multidetector CT imaging of the  abdomen and pelvis was performed using the standard protocol during bolus administration of intravenous contrast. CONTRAST:  100 cc of Isovue 370 COMPARISON:  Chest radiograph of earlier today.  CTs of 11/14/2016. FINDINGS: CTA CHEST FINDINGS Cardiovascular:  Mild motion degradation The quality of this exam for evaluation of pulmonary embolism is moderate to good. No evidence of pulmonary embolism. Advanced aortic and branch vessel atherosclerosis. Mild cardiomegaly with lipomatous hypertrophy of the interatrial septum. A right Port-A-Cath which terminates in the right atrium. Mediastinum/Nodes: No supraclavicular adenopathy. No mediastinal or hilar adenopathy. Tiny hiatal hernia. Lungs/Pleura: No pleural fluid. Mild left-sided pleural thickening is similar. Moderate centrilobular emphysema. Lower lobe predominant bronchial wall thickening. Spiculated right apical pulmonary nodule measures 1.6 x 1.1 cm on image 22/series 8 and is felt to be similar to 1.7 x 1.5 cm on the prior. This solid component of a subjacent cavitary lesion measures 1.6 x 1.2 cm on image 27/series 8. Compare 1.6 x 1.1 cm on the prior. Right middle lobe ground-glass opacity measures on the order of 2.6 x 2.1 cm today versus 3.3 x 2.3 cm on the prior. Left apical and central upper lobe masslike consolidation. Example at 3.1 x 2.6 cm on image 19/series 8. Similar. Extension toward the left suprahilar left upper lobe measures 2.8 x 1.8 cm on image 28/series 8. Compare 3.0 x 2.2 cm at the same level on the prior. Mild anterior left lower lobe ground-glass opacity may be radiation induced but is new. A 3 mm left lower lobe pulmonary nodule is unchanged on image 37/series 8. Superior segment left lower lobe septal thickening and patchy airspace disease are new and may also be radiation induced. Musculoskeletal: T3 and T4 moderate and severe compression deformities respectively. Status post vertebral augmentation. Similar. Accentuation of expected  thoracic kyphosis about this level. Review of the MIP images confirms the above findings. CT ABDOMEN and PELVIS FINDINGS Hepatobiliary: Too small to characterize hepatic dome lesion. No suspicious liver lesion. Mild caudate lobe enlargement is nonspecific. The gallbladder is mildly distended. No pericholecystic edema or calcified stone. Upper normal intrahepatic ducts. No common duct dilatation. Pancreas: Mild pancreatic atrophy, without duct dilatation dominant mass. Spleen: Normal in size, without focal abnormality. Adrenals/Urinary Tract: Mild motion artifact within the upper abdomen. Normal adrenal glands. Bilateral too small to characterize renal lesions. 1.5 cm right renal cyst. No hydronephrosis. Stomach/Bowel: Gastric antral underdistention. Colonic stool burden suggests constipation. Normal small bowel. Vascular/Lymphatic: Non aneurysmal dilatation of the infrarenal  aorta at 12.5 cm is not significantly changed. No abdominopelvic adenopathy. Reproductive: Hysterectomy.  No adnexal mass. Other: No significant free fluid. Musculoskeletal: Tarlov cysts. Review of the MIP images confirms the above findings. IMPRESSION: 1.  No evidence of pulmonary embolism. 2. Right upper lobe pulmonary nodules and left upper lobe/apical masslike consolidation, similar. A right middle lobe ground-glass opacity is slightly decreased. 3. No thoracic adenopathy. 4. Coronary artery atherosclerosis. Aortic Atherosclerosis (ICD10-I70.0). 5. Similar T3 and T4 compression deformities, status post vertebral augmentation. 6. No acute process or evidence of metastatic disease in the abdomen or pelvis. 7. Gallbladder distension which is nonspecific. Correlate with right upper quadrant symptoms and possibly right upper quadrant ultrasound. 8.  Tiny hiatal hernia. 9. Areas of superior segment left lower lobe and left lower lobe mild ground-glass and airspace opacity are nonspecific but could be radiation induced. Given they are new since the  prior exam, areas of infection cannot be excluded. Electronically Signed   By: Abigail Miyamoto M.D.   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 Persistent left-sided pain. Confusion. EXAM: CT ANGIOGRAPHY CHEST CT ABDOMEN AND PELVIS WITH CONTRAST TECHNIQUE: Multidetector CT imaging of the chest was performed using the standard protocol during bolus administration of intravenous contrast. Multiplanar CT image reconstructions and MIPs were obtained to evaluate the vascular anatomy. Multidetector CT imaging of the abdomen and pelvis was performed using the standard protocol during bolus administration of intravenous contrast. CONTRAST:  100 cc of Isovue 370 COMPARISON:  Chest radiograph of earlier today.  CTs of 11/14/2016. FINDINGS: CTA CHEST FINDINGS Cardiovascular:  Mild motion degradation The quality of this exam for evaluation of pulmonary embolism is moderate to good. No evidence of pulmonary embolism. Advanced aortic and branch vessel atherosclerosis. Mild cardiomegaly with lipomatous hypertrophy of the interatrial septum. A right Port-A-Cath which terminates in the right atrium. Mediastinum/Nodes: No supraclavicular adenopathy. No mediastinal or hilar adenopathy. Tiny hiatal hernia. Lungs/Pleura: No pleural fluid. Mild left-sided pleural thickening is similar. Moderate centrilobular emphysema. Lower lobe predominant bronchial wall thickening. Spiculated right apical pulmonary nodule measures 1.6 x 1.1 cm on image 22/series 8 and is felt to be similar to 1.7 x 1.5 cm on the prior. This solid component of a subjacent cavitary lesion measures 1.6 x 1.2 cm on image 27/series 8. Compare 1.6 x 1.1 cm on the prior. Right middle lobe ground-glass opacity measures on the order of 2.6 x 2.1 cm today versus 3.3 x 2.3 cm on the prior. Left apical and central upper lobe masslike consolidation. Example at 3.1 x 2.6 cm on image  19/series 8. Similar. Extension toward the left suprahilar left upper lobe measures 2.8 x 1.8 cm on image 28/series 8. Compare 3.0 x 2.2 cm at the same level on the prior. Mild anterior left lower lobe ground-glass opacity may be radiation induced but is new. A 3 mm left lower lobe pulmonary nodule is unchanged on image 37/series 8. Superior segment left lower lobe septal thickening and patchy airspace disease are new and may also be radiation induced. Musculoskeletal: T3 and T4 moderate and severe compression deformities respectively. Status post vertebral augmentation. Similar. Accentuation of expected thoracic kyphosis about this level. Review of the MIP images confirms the above findings. CT ABDOMEN and PELVIS FINDINGS Hepatobiliary: Too small to characterize hepatic dome lesion. No suspicious liver lesion. Mild caudate lobe enlargement is nonspecific. The gallbladder is mildly distended. No pericholecystic edema  or calcified stone. Upper normal intrahepatic ducts. No common duct dilatation. Pancreas: Mild pancreatic atrophy, without duct dilatation dominant mass. Spleen: Normal in size, without focal abnormality. Adrenals/Urinary Tract: Mild motion artifact within the upper abdomen. Normal adrenal glands. Bilateral too small to characterize renal lesions. 1.5 cm right renal cyst. No hydronephrosis. Stomach/Bowel: Gastric antral underdistention. Colonic stool burden suggests constipation. Normal small bowel. Vascular/Lymphatic: Non aneurysmal dilatation of the infrarenal aorta at 12.5 cm is not significantly changed. No abdominopelvic adenopathy. Reproductive: Hysterectomy.  No adnexal mass. Other: No significant free fluid. Musculoskeletal: Tarlov cysts. Review of the MIP images confirms the above findings. IMPRESSION: 1.  No evidence of pulmonary embolism. 2. Right upper lobe pulmonary nodules and left upper lobe/apical masslike consolidation, similar. A right middle lobe ground-glass opacity is slightly  decreased. 3. No thoracic adenopathy. 4. Coronary artery atherosclerosis. Aortic Atherosclerosis (ICD10-I70.0). 5. Similar T3 and T4 compression deformities, status post vertebral augmentation. 6. No acute process or evidence of metastatic disease in the abdomen or pelvis. 7. Gallbladder distension which is nonspecific. Correlate with right upper quadrant symptoms and possibly right upper quadrant ultrasound. 8.  Tiny hiatal hernia. 9. Areas of superior segment left lower lobe and left lower lobe mild ground-glass and airspace opacity are nonspecific but could be radiation induced. Given they are new since the prior exam, areas of infection cannot be excluded. Electronically Signed   By: Abigail Miyamoto M.D.   On: 02/05/2017 14:24    ASSESSMENT AND PLAN:  This is a very pleasant 66 years old white female with a stage IV non-small cell lung cancer, adenocarcinoma status post several regimens of systemic chemotherapy as well as immunotherapy. Currently undergoing systemic chemotherapy with reduced dose docetaxel 65 MG/M2 and Cyramza 10 MG/KG every 3 weeks status post 8 cycles. She has rough time with the last few cycles of her treatment with increasing fatigue and weakness as well as peripheral neuropathy and weight loss. She had repeat CT scan of the chest, abdomen and pelvis performed recently. The scan showed no evidence for pulmonary embolism and there was similar to slight decrease of the lung nodules. There was no evidence of new metastatic or progressive disease. I personally and independently reviewed the scan images and discuss the results with the patient and her daughter today. I recommended for her to continue on observation at this time because of the side effect of the chemotherapy recently. I will see her back for follow-up visit in 3 months for reevaluation with repeat CT scan of the chest, abdomen and pelvis for restaging of her disease. For pain management, she will continue on tramadol and  I gave her refill of her medication. For anxiety, she will continue treatment with Xanax and she was giving refill today. For the hypokalemia, I will start the patient on potassium chloride 40 mEq by mouth daily. She was advised to call immediately if she has any concerning symptoms in the interval. The patient voices understanding of current disease status and treatment options and is in agreement with the current care plan. All questions were answered. The patient knows to call the clinic with any problems, questions or concerns. We can certainly see the patient much sooner if necessary. Disclaimer: This note was dictated with voice recognition software. Similar sounding words can inadvertently be transcribed and may not be corrected upon review.

## 2017-02-24 ENCOUNTER — Other Ambulatory Visit: Payer: Medicare Other

## 2017-03-03 ENCOUNTER — Ambulatory Visit: Payer: Medicare Other

## 2017-03-03 ENCOUNTER — Ambulatory Visit: Payer: Medicare Other | Admitting: Internal Medicine

## 2017-03-03 ENCOUNTER — Other Ambulatory Visit: Payer: Medicare Other

## 2017-03-04 ENCOUNTER — Other Ambulatory Visit: Payer: Self-pay | Admitting: Internal Medicine

## 2017-03-04 DIAGNOSIS — Z95828 Presence of other vascular implants and grafts: Secondary | ICD-10-CM

## 2017-03-04 DIAGNOSIS — C3492 Malignant neoplasm of unspecified part of left bronchus or lung: Secondary | ICD-10-CM

## 2017-03-04 DIAGNOSIS — M549 Dorsalgia, unspecified: Principal | ICD-10-CM

## 2017-03-04 DIAGNOSIS — G8929 Other chronic pain: Secondary | ICD-10-CM

## 2017-03-04 DIAGNOSIS — Z5111 Encounter for antineoplastic chemotherapy: Secondary | ICD-10-CM

## 2017-03-04 DIAGNOSIS — F411 Generalized anxiety disorder: Secondary | ICD-10-CM

## 2017-03-15 ENCOUNTER — Other Ambulatory Visit: Payer: Self-pay | Admitting: Internal Medicine

## 2017-03-15 DIAGNOSIS — Z5111 Encounter for antineoplastic chemotherapy: Secondary | ICD-10-CM

## 2017-03-15 DIAGNOSIS — Z95828 Presence of other vascular implants and grafts: Secondary | ICD-10-CM

## 2017-03-15 DIAGNOSIS — G8929 Other chronic pain: Secondary | ICD-10-CM

## 2017-03-15 DIAGNOSIS — M549 Dorsalgia, unspecified: Principal | ICD-10-CM

## 2017-03-15 DIAGNOSIS — F411 Generalized anxiety disorder: Secondary | ICD-10-CM

## 2017-03-15 DIAGNOSIS — C3492 Malignant neoplasm of unspecified part of left bronchus or lung: Secondary | ICD-10-CM

## 2017-03-16 ENCOUNTER — Telehealth: Payer: Self-pay | Admitting: Medical Oncology

## 2017-03-16 NOTE — Telephone Encounter (Signed)
Called in refill-tramadol

## 2017-03-17 ENCOUNTER — Ambulatory Visit: Payer: Self-pay | Admitting: Family Medicine

## 2017-04-03 ENCOUNTER — Telehealth: Payer: Self-pay | Admitting: Oncology

## 2017-04-03 NOTE — Telephone Encounter (Signed)
04/02/17 - Called patient and left message for her to call be back so we can do the BMS questionnaire over the phone. [name] 04/02/17 - 10:30 am  04/03/17 - Called patient back and left message again for her to call me so we can do the BMS questionnaire for the study over the phone. [name] 04/03/17 - 1:10 pm  04/06/17 - Patient returned my call at 9:45 am.  I called the patient back at 10:15 and she answered the phone.  She states that she does not like to talk on the phone so that is why she did not call me back last week.  I asked if I could ask her the questions over the phone for the BMS 370 study.  She was alright with me asking her the questions.  She told me the answers and I thanked the patient for taking the time to answer the questions and for her support in this clinical trial. [name] 04/06/17 - 10:30 am

## 2017-04-13 ENCOUNTER — Ambulatory Visit: Payer: Medicare Other | Admitting: Adult Health

## 2017-04-21 ENCOUNTER — Ambulatory Visit (INDEPENDENT_AMBULATORY_CARE_PROVIDER_SITE_OTHER): Payer: Medicare Other | Admitting: Adult Health

## 2017-04-21 ENCOUNTER — Encounter: Payer: Self-pay | Admitting: Adult Health

## 2017-04-21 ENCOUNTER — Ambulatory Visit (INDEPENDENT_AMBULATORY_CARE_PROVIDER_SITE_OTHER)
Admission: RE | Admit: 2017-04-21 | Discharge: 2017-04-21 | Disposition: A | Discharge status: Home / Self Care | Payer: Medicare Other | Source: Ambulatory Visit | Attending: Adult Health | Admitting: Adult Health

## 2017-04-21 DIAGNOSIS — R030 Elevated blood-pressure reading, without diagnosis of hypertension: Secondary | ICD-10-CM | POA: Diagnosis not present

## 2017-04-21 DIAGNOSIS — C3492 Malignant neoplasm of unspecified part of left bronchus or lung: Secondary | ICD-10-CM

## 2017-04-21 DIAGNOSIS — J441 Chronic obstructive pulmonary disease with (acute) exacerbation: Secondary | ICD-10-CM | POA: Diagnosis not present

## 2017-04-21 MED ORDER — DOXYCYCLINE HYCLATE 100 MG PO TABS: 100.0000 mg | ORAL_TABLET | Freq: Two times a day (BID) | ORAL | 0 refills | Status: DC

## 2017-04-21 MED ORDER — PREDNISONE 10 MG PO TABS: ORAL_TABLET | ORAL | 0 refills | Status: DC

## 2017-04-21 NOTE — Addendum Note (Signed)
Addended by: Parke Poisson E on: 04/21/2017 12:13 PM   Modules accepted: Orders

## 2017-04-21 NOTE — Assessment & Plan Note (Addendum)
Exacerbation  Check cxr today   Plan  Patient Instructions  Continue on ANORO daily , rinse after use.  Chest xray today .  Doxycycline 100mg  Twice daily  For 1 week .  Mucinex DM Twice daily As needed  Cough/congestion  Prednisone taper over next week.  Cut back Norvasc /Amlodipine 5mg  1/2 daily .  Follow up with Primary MD for blood pressure control .   Follow up with Oncology as planned .  Follow up Dr. Elsworth Soho  In  3-4   months and As needed   .Please contact office for sooner follow up if symptoms do not improve or worsen or seek emergency care

## 2017-04-21 NOTE — Assessment & Plan Note (Signed)
HTN ? Overcompensated .  Will cut norvasc dose in half and get her to follow up with PCP.

## 2017-04-21 NOTE — Progress Notes (Signed)
@Patient  ID: Heather Banks, female    DOB: 1950-10-19, 66 y.o.   MRN: [id number]  Chief Complaint  Patient presents with  . Follow-up    COPD.     Referring provider: Scot Jun, FNP  HPI: [phone] smoker with COPD on 3l home O2 and adenocarcinoma lung, stage IV. She presented 06/2014 with bilateral upper lobe lung mass w/ minmally enlarged hilar and mediastinal nodes, hypermetabolic on PET Underwent ENB guided biopsy , LUL bx was neg, RUL-2 targets showed adenoCA- felt to be synchronous tumors  Had disease progression on chemoRx x 6 cycles Completed immunotherapy  Palliative Radiotherapy in LUL mass completed in 10/2015     Significant tests/ events  PFT 06/2014 FEV1 at 46%, ratio of 59. Diffusing capacity was decreased at 56% , FVC was 60%  6/2017CT chest showed a small PE in the left lateral basal segment of the pulmonary artery was treated withXarelto for 3 months Venous Doppler was negative.  CT chest 03/2016 , decreased in LUL mass. Adenopathy resolved. Stable right sided nodules.   CT chest 10/2016 >decrease in pulmonary nodules , LUL mass, no adenopathy  CT chest 01/2017 >Neg for PE , stable lung nodules and LUL  mass. , new GGO in LLL ? Radiation induced.   04/21/2017 Follow up : COPD , O2 RF , Lung Cancer  Pt returns for follow up for COPD . She remains on ANORO daily.  She is on O2 2l/m  Has known stage IV lung cancer dx in 2016 s/p chemo and XRT . Disease progression . Now currently on Systemic chemotherapy with docetaxel 75 MG/M2 and Cyramza 10 MG/KG every 3 weeks with Neulasta support.by Oncology . CT chest in 10/2016 showed decreased pulmonary nodules and mass. CT chest in 01/2017 was stable. New GGO opacitiy in LLL . ? radioation induced.  Finished chemo all but 1 cycle . Has upcoming CT chest in May 27 2017.   She was admitted in July with cough and congestion , tx for PNA> CT chest in 01/2017 was stable. New GGO opacitiy in LLL . She was treated with  aggressive abx.  She is feeling breathing is worse for last week with thick green mucus.  She remains very weak. Now has to use cane and wheelchair.  Still has thick mucus. And wheezing on/off.   Blood pressure is low end of normal . Denies dizziness or n/v.           Allergies  Allergen Reactions  . Ativan [Lorazepam] Other (See Comments)    Reaction:  Makes pt angry - opposite reaction of med should do  . Cinnamon Other (See Comments)    Seasonal scent trigger migraine as well as pt's overall immune system    Immunization History  Administered Date(s) Administered  . Influenza Split 03/28/2014  . Influenza,inj,Quad PF,6+ Mos 04/10/2015, 05/19/2016  . Pneumococcal Conjugate-13 07/14/2016  . Pneumococcal Polysaccharide-23 06/28/2011  . Tdap 06/03/2016    Past Medical History:  Diagnosis Date  . Adenocarcinoma of left lung, stage 4 ([place]) 07/09/2014  . Compression fracture of thoracic vertebra ([facility]) 04/21/2016  . COPD (chronic obstructive pulmonary disease) ([facility])    Albuterol neb as needed;Pulmicort neb daily  . Encounter for antineoplastic chemotherapy 01/21/2015  . Full code status 05/01/2015  . GERD (gastroesophageal reflux disease)    takes Pantoprazole and Zantac daily  . History of bronchitis    >5 yrs ago  . History of migraine    last one 2 wks ago  .  Hx of radiation therapy 10/29/2015 to 11/16/2015   The Left upper lobe was treated to 37.5 Gy in 15 fractions at 2.5 Gy per fraction  . Lung mass   . Panic attacks    but doesn't take any meds  . Pneumonia    hx of-last time about 4+yrs ago    Tobacco History: History  Smoking Status  . Former Smoker  . Packs/day: 0.25  . Years: 44.00  . Types: Cigarettes  . Quit date: 03/14/2016  Smokeless Tobacco  . Never Used   Counseling given: Not Answered   Outpatient Encounter Prescriptions as of 04/21/2017  Medication Sig  . ALPRAZolam (XANAX) 0.25 MG tablet Take 1 tablet (0.25 mg total) by mouth 2 (two)  times daily as needed for anxiety.  Marland Kitchen amLODipine (NORVASC) 5 MG tablet Take 1 tablet (5 mg total) by mouth daily.  . calcium carbonate (OS-CAL - DOSED IN MG OF ELEMENTAL CALCIUM) 1250 (500 Ca) MG tablet Take 1 tablet by mouth daily with breakfast.  . cholecalciferol (VITAMIN D) 1000 units tablet Take 1,000 Units by mouth daily.  Marland Kitchen dexamethasone (DECADRON) 4 MG tablet 2 tablets by mouth twice a day the day before, day of and day after the chemotherapy every 3 weeks.  . diphenhydrAMINE (BENADRYL) 25 mg capsule Take 25 mg by mouth every 6 (six) hours as needed for itching. Reported on 01/19/2016  . hydroxypropyl methylcellulose / hypromellose (ISOPTO TEARS / GONIOVISC) 2.5 % ophthalmic solution 1 drop.  . Lidocaine (ASPERCREME LIDOCAINE) 4 % PTCH Apply 1 patch topically daily as needed. Place on back as needed for pain  . lidocaine-prilocaine (EMLA) cream Apply 1 application topically as needed. Apply 1 tsp over port site 1-1.5 hours prior to chemotherapy.  . loratadine (CLARITIN) 10 MG tablet Take 1 tablet (10 mg total) by mouth daily.  . mirtazapine (REMERON) 30 MG tablet Take 0.5 tablets (15 mg total) by mouth at bedtime as needed (for sleep).  . potassium chloride SA (K-DUR,KLOR-CON) 20 MEQ tablet TAKE 1 TABLET(20 MEQ) BY MOUTH TWICE DAILY  . prochlorperazine (COMPAZINE) 10 MG tablet Take 1 tablet (10 mg total) by mouth every 6 (six) hours as needed for nausea or vomiting.  . traMADol (ULTRAM) 50 MG tablet TAKE 2 TABLETS BY MOUTH EVERY 6 HOURS AS NEEDED  . umeclidinium-vilanterol (ANORO ELLIPTA) 62.5-25 MCG/INH AEPB Inhale 1 puff into the lungs daily.  . VENTOLIN HFA 108 (90 Base) MCG/ACT inhaler INHALE 2 PUFFS BY MOUTH EVERY 6 HOURS AS NEEDED FOR WHEEZE OR SHORTNESS OF BREATH  . doxycycline (VIBRA-TABS) 100 MG tablet Take 1 tablet (100 mg total) by mouth 2 (two) times daily.  . predniSONE (DELTASONE) 10 MG tablet 4 tabs for 2 days, then 3 tabs for 2 days, 2 tabs for 2 days, then 1 tab for 2 days,  then stop  . [DISCONTINUED] acidophilus (RISAQUAD) CAPS capsule Take 1 capsule by mouth daily.  . [DISCONTINUED] cyanocobalamin 2000 MCG tablet Take 2,000 mcg by mouth daily.  . [DISCONTINUED] famotidine (PEPCID) 20 MG tablet Take 1 tablet (20 mg total) by mouth daily. (Patient not taking: Reported on 02/17/2017)  . [DISCONTINUED] ondansetron (ZOFRAN) 8 MG tablet Take 1 tablet (8 mg total) by mouth every 8 (eight) hours as needed for nausea or vomiting. (Patient not taking: Reported on 02/23/2017)   Facility-Administered Encounter Medications as of 04/21/2017  Medication  . heparin lock flush 100 unit/mL  . sodium chloride 0.9 % injection 10 mL  . sodium chloride 0.9 % injection 10  mL     Review of Systems  Constitutional:   No  weight loss, night sweats,  Fevers, chills,  +fatigue, or  lassitude.  HEENT:   No headaches,  Difficulty swallowing,  Tooth/dental problems, or  Sore throat,                No sneezing, itching, ear ache, nasal congestion, post nasal drip,   CV:  No chest pain,  Orthopnea, PND, swelling in lower extremities, anasarca, dizziness, palpitations, syncope.   GI  No heartburn, indigestion, abdominal pain, nausea, vomiting, diarrhea, change in bowel habits, loss of appetite, bloody stools.   Resp: No chest wall deformity  Skin: no rash or lesions.  GU: no dysuria, change in color of urine, no urgency or frequency.  No flank pain, no hematuria   MS:  No joint pain or swelling.  No decreased range of motion.  No back pain.    Physical Exam  BP 96/60 (BP Location: Right Arm, Cuff Size: Normal)   Pulse (!) 103   Ht 5\' 5"  (1.651 m)   Wt 146 lb 6.4 oz (66.4 kg)   SpO2 93%   BMI 24.36 kg/m   GEN: A/Ox3; pleasant , NAD, in wc on O2    HEENT:  [place]/AT,  EACs-clear, TMs-wnl, NOSE-clear, THROAT-clear, no lesions, no postnasal drip or exudate noted. Poor dentition   NECK:  Supple w/ fair ROM; no JVD; normal carotid impulses w/o bruits; no thyromegaly or nodules  palpated; no lymphadenopathy.    RESP  Few rhonchi .,  no accessory muscle use, no dullness to percussion  CARD:  RRR, no m/r/g, tr peripheral edema, pulses intact, no cyanosis or clubbing.  GI:   Soft & nt; nml bowel sounds; no organomegaly or masses detected.   Musco: Warm bil, no deformities or joint swelling noted.   Neuro: alert, no focal deficits noted.    Skin: Warm, no lesions or rashes    Lab Results:    ProBNP No results found for: PROBNP  Imaging: No results found.   Assessment & Plan:   Adenocarcinoma of left lung, stage 4 ([place]) Follow up with .Oncology as planned   COPD (chronic obstructive pulmonary disease) ([place]) Exacerbation  Check cxr today   Plan  Patient Instructions  Continue on ANORO daily , rinse after use.  Chest xray today .  Doxycycline 100mg  Twice daily  For 1 week .  Mucinex DM Twice daily As needed  Cough/congestion  Prednisone taper over next week.  Cut back Norvasc /Amlodipine 5mg  1/2 daily .  Follow up with Primary MD for blood pressure control .   Follow up with Oncology as planned .  Follow up Dr. Elsworth Soho  In  3-4   months and As needed   .Please contact office for sooner follow up if symptoms do not improve or worsen or seek emergency care         Rexene Edison, NP 04/21/2017

## 2017-04-21 NOTE — Assessment & Plan Note (Signed)
Follow up with .Oncology as planned

## 2017-04-21 NOTE — Patient Instructions (Addendum)
Continue on ANORO daily , rinse after use.  Chest xray today .  Doxycycline 100mg  Twice daily  For 1 week .  Mucinex DM Twice daily As needed  Cough/congestion  Prednisone taper over next week.  Cut back Norvasc /Amlodipine 5mg  1/2 daily .  Follow up with Primary MD for blood pressure control .   Follow up with Oncology as planned .  Follow up Dr. Elsworth Soho  In  3-4   months and As needed   .Please contact office for sooner follow up if symptoms do not improve or worsen or seek emergency care

## 2017-04-24 ENCOUNTER — Telehealth: Payer: Self-pay | Admitting: Adult Health

## 2017-04-24 DIAGNOSIS — J181 Lobar pneumonia, unspecified organism: Secondary | ICD-10-CM

## 2017-04-24 MED ORDER — DOXYCYCLINE HYCLATE 100 MG PO TABS: 100.0000 mg | ORAL_TABLET | Freq: Two times a day (BID) | ORAL | 0 refills | Status: DC

## 2017-04-24 NOTE — Progress Notes (Signed)
LMOM TCB x2

## 2017-04-24 NOTE — Telephone Encounter (Signed)
Notes recorded by Melvenia Needles, NP on 04/21/2017 at 4:34 PM EDT CXR shows LUL area is worse ? Superimposed PNA  Extend for 3 , total of 10 days Doxycycline 100mg  Twice daily #6.  Ov in 10 days with cxr  Please contact office for sooner follow up if symptoms do not improve or worsen or seek emergency care       Advised pt of TP message. Pt understood. Sent in Rx to [facility] and placed order for chest xray with appt on 10/8. Nothing further is needed.

## 2017-04-30 ENCOUNTER — Ambulatory Visit: Payer: Medicare Other | Admitting: Internal Medicine

## 2017-05-05 ENCOUNTER — Ambulatory Visit (INDEPENDENT_AMBULATORY_CARE_PROVIDER_SITE_OTHER): Payer: Medicare Other | Admitting: Adult Health

## 2017-05-05 ENCOUNTER — Other Ambulatory Visit (INDEPENDENT_AMBULATORY_CARE_PROVIDER_SITE_OTHER): Payer: Medicare Other

## 2017-05-05 ENCOUNTER — Ambulatory Visit (INDEPENDENT_AMBULATORY_CARE_PROVIDER_SITE_OTHER)
Admission: RE | Admit: 2017-05-05 | Discharge: 2017-05-05 | Disposition: A | Discharge status: Home / Self Care | Payer: Medicare Other | Source: Ambulatory Visit | Attending: Adult Health | Admitting: Adult Health

## 2017-05-05 ENCOUNTER — Encounter: Payer: Self-pay | Admitting: Adult Health

## 2017-05-05 VITALS — BP 104/62 | HR 109 | Ht 65.0 in | Wt 145.2 lb

## 2017-05-05 DIAGNOSIS — J181 Lobar pneumonia, unspecified organism: Secondary | ICD-10-CM

## 2017-05-05 DIAGNOSIS — J441 Chronic obstructive pulmonary disease with (acute) exacerbation: Secondary | ICD-10-CM | POA: Diagnosis not present

## 2017-05-05 DIAGNOSIS — C3492 Malignant neoplasm of unspecified part of left bronchus or lung: Secondary | ICD-10-CM

## 2017-05-05 DIAGNOSIS — J189 Pneumonia, unspecified organism: Secondary | ICD-10-CM

## 2017-05-05 DIAGNOSIS — J984 Other disorders of lung: Secondary | ICD-10-CM

## 2017-05-05 LAB — CBC WITH DIFFERENTIAL/PLATELET
BASOS ABS: 0.1 10*3/uL (ref 0.0–0.1)
BASOS PCT: 0.8 % (ref 0.0–3.0)
Eosinophils Absolute: 0.4 10*3/uL (ref 0.0–0.7)
Eosinophils Relative: 4.6 % (ref 0.0–5.0)
HEMATOCRIT: 33.2 % — AB (ref 36.0–46.0)
Hemoglobin: 10.6 g/dL — ABNORMAL LOW (ref 12.0–15.0)
LYMPHS ABS: 1.2 10*3/uL (ref 0.7–4.0)
LYMPHS PCT: 12.9 % (ref 12.0–46.0)
MCHC: 31.9 g/dL (ref 30.0–36.0)
MCV: 85.3 fl (ref 78.0–100.0)
MONOS PCT: 10.3 % (ref 3.0–12.0)
Monocytes Absolute: 1 10*3/uL (ref 0.1–1.0)
NEUTROS ABS: 6.9 10*3/uL (ref 1.4–7.7)
NEUTROS PCT: 71.4 % (ref 43.0–77.0)
PLATELETS: 667 10*3/uL — AB (ref 150.0–400.0)
RBC: 3.9 Mil/uL (ref 3.87–5.11)
RDW: 21.2 % — AB (ref 11.5–15.5)
WBC: 9.7 10*3/uL (ref 4.0–10.5)

## 2017-05-05 LAB — BASIC METABOLIC PANEL
BUN: 6 mg/dL (ref 6–23)
CO2: 26 mEq/L (ref 19–32)
CREATININE: 0.43 mg/dL (ref 0.40–1.20)
Calcium: 9.5 mg/dL (ref 8.4–10.5)
Chloride: 103 mEq/L (ref 96–112)
GFR: 157.04 mL/min (ref >60.00)
Glucose, Bld: 88 mg/dL (ref 70–99)
Potassium: 4.4 mEq/L (ref 3.5–5.1)
Sodium: 137 mEq/L (ref 135–145)

## 2017-05-05 MED ORDER — UMECLIDINIUM-VILANTEROL 62.5-25 MCG/INH IN AEPB: 1.0000 | INHALATION_SPRAY | Freq: Every day | RESPIRATORY_TRACT | 0 refills | Status: DC

## 2017-05-05 MED ORDER — AMOXICILLIN-POT CLAVULANATE 875-125 MG PO TABS: 1.0000 | ORAL_TABLET | Freq: Two times a day (BID) | ORAL | 0 refills | Status: DC

## 2017-05-05 NOTE — Assessment & Plan Note (Signed)
follow up with Oncology as planned

## 2017-05-05 NOTE — Assessment & Plan Note (Addendum)
Suspected LUL PNA - LUL opacity with air fluid level. Difficult to determine if this from the PNA in July or new process.  She is immunosuppressed with recent Chemo last in August, recent steroids . She is higher risk for atypicals infections . However doubt she can get sputum cx /fungal specimen today .  Will treat empirically with Abx with broad spectrum -augmentin . Check CT chest to evaluate area and compare to July CT chest .  If not improving may need to induce sputum culture vs FOB   Plan  Patient Instructions  Begin Augmentin 875mg  Twice daily  For 2 weeks .-take with food . Eat yogurt   Continue on ANORO daily , rinse after use.  Mucinex DM Twice daily As needed  Cough/congestion  Continue on ANORO daily .  Set up CT chest.  Labs today .  Follow up with Oncology as planned .  Follow up Dr. Elsworth Soho  In  3-4  Weeks and As needed   .Please contact office for sooner follow up if symptoms do not improve or worsen or seek emergency care

## 2017-05-05 NOTE — Patient Instructions (Addendum)
Begin Augmentin 875mg  Twice daily  For 2 weeks .-take with food . Eat yogurt   Continue on ANORO daily , rinse after use.  Mucinex DM Twice daily As needed  Cough/congestion  Continue on ANORO daily .  Set up CT chest.  Labs today .  Follow up with Oncology as planned .  Follow up Dr. Elsworth Soho  In  3-4  Weeks and As needed   .Please contact office for sooner follow up if symptoms do not improve or worsen or seek emergency care

## 2017-05-05 NOTE — Progress Notes (Signed)
@Patient  ID: Heather Banks, female    DOB: October 17, 1950, 66 y.o.   MRN: [id number]  Chief Complaint  Patient presents with  . Follow-up    PNA     Referring provider: Scot Jun, FNP  HPI: [phone] smoker with COPD on 3l home O2 and adenocarcinoma lung, stage IV. She presented 06/2014 with bilateral upper lobe lung mass w/ minmally enlarged hilar and mediastinal nodes, hypermetabolic on PET Underwent ENB guided biopsy , LUL bx was neg, RUL-2 targets showed adenoCA- felt to be synchronous tumors  Had disease progression on chemoRx x 6 cycles Completed immunotherapy  Palliative Radiotherapy in LUL mass completed in 10/2015     Significant tests/ events PFT 06/2014 FEV1 at 46%, ratio of 59. Diffusing capacity was decreased at 56% , FVC was 60%  6/2017CT chest showed a small PE in the left lateral basal segment of the pulmonary artery was treated withXarelto for 3 months Venous Doppler was negative. CT chest 03/2016 , decreased in LUL mass. Adenopathy resolved. Stable right sided nodules.   CT chest 10/2016 >decrease in pulmonary nodules , LUL mass, no adenopathy  CT chest 01/2017 >Neg for PE , stable lung nodules and LUL  mass. , new GGO in LLL ? Radiation induced.   Oncology notes:  Systemic chemotherapy with carboplatin for AUC of 5 and Alimta 500 MG/M2 every 3 weeks. Status post 6 cycles. 2) Treatment according to the BMS checkmate 370 clinical trial. Randomized to group A arm B1 treatment with pemetrexed (Alimta) 500 mg/m given every 3 weeks. Status post 6 cycles discontinued secondary to disease progression. 3) Tecentriq 1200 mg IV every 3 weeks status post 6 cycles discontinued secondary to significant skin rash. 4) palliative radiotherapy to the left upper lobe lung mass under the care of Dr. Lisbeth Renshaw completed on 11/16/2015.  CURRENT THERAPY: Systemic chemotherapy with docetaxel 75 MG/M2 and Cyramza 10 MG/KG every 3 weeks with Neulasta support. First dose  08/25/2016. Status post 8 cycles. Starting from cycle #6, I would reduce the dose of docetaxel to 65 MG/M2 secondary to intolerance.- Last dose 02/2016    05/05/2017 Follow up : COPD /PNA /Lung Cancer  Pt returns for 2 week follow up . She was seen last for COPD flare with 1 week of increased cough with tick green mucus .  , tx w/ Doxyycline x 10 days and Prednisone taper. CXR showed increased LUL opacity with air fluid level.  She has seen cough decrease some , now minimally productive but does not feel much better. SOB is better.  Remains weak with no energy . No fever, CXR today shows  LUL opacity with air fluid level decreased.  No chest pain, orthopnea, edema or fever. Appetite is okay . No nv/d.  She was admitted in July for PNA . CT chest showed New LLL GGO . She was treated with ABX . No follow up chest xray was done.  Pt has known stage IV lung cancer dx in 2016 . She has undergone Chemo, XRT . Last chemo in 02/2017 .     Allergies  Allergen Reactions  . Ativan [Lorazepam] Other (See Comments)    Reaction:  Makes pt angry - opposite reaction of med should do  . Cinnamon Other (See Comments)    Seasonal scent trigger migraine as well as pt's overall immune system    Immunization History  Administered Date(s) Administered  . Influenza Split 03/28/2014  . Influenza,inj,Quad PF,6+ Mos 04/10/2015, 05/19/2016  . Pneumococcal Conjugate-13 07/14/2016  .  Pneumococcal Polysaccharide-23 06/28/2011  . Tdap 06/03/2016    Past Medical History:  Diagnosis Date  . Adenocarcinoma of left lung, stage 4 (Sun) 07/09/2014  . Compression fracture of thoracic vertebra (HCC) 04/21/2016  . COPD (chronic obstructive pulmonary disease) (HCC)    Albuterol neb as needed;Pulmicort neb daily  . Encounter for antineoplastic chemotherapy 01/21/2015  . Full code status 05/01/2015  . GERD (gastroesophageal reflux disease)    takes Pantoprazole and Zantac daily  . History of bronchitis    >5 yrs ago  .  History of migraine    last one 2 wks ago  . Hx of radiation therapy 10/29/2015 to 11/16/2015   The Left upper lobe was treated to 37.5 Gy in 15 fractions at 2.5 Gy per fraction  . Lung mass   . Panic attacks    but doesn't take any meds  . Pneumonia    hx of-last time about 4+yrs ago    Tobacco History: History  Smoking Status  . Former Smoker  . Packs/day: 0.25  . Years: 44.00  . Types: Cigarettes  . Quit date: 03/14/2016  Smokeless Tobacco  . Never Used   Counseling given: Not Answered   Outpatient Encounter Prescriptions as of 05/05/2017  Medication Sig  . ALPRAZolam (XANAX) 0.25 MG tablet Take 1 tablet (0.25 mg total) by mouth 2 (two) times daily as needed for anxiety.  . calcium carbonate (OS-CAL - DOSED IN MG OF ELEMENTAL CALCIUM) 1250 (500 Ca) MG tablet Take 1 tablet by mouth daily with breakfast.  . cholecalciferol (VITAMIN D) 1000 units tablet Take 1,000 Units by mouth daily.  Marland Kitchen dexamethasone (DECADRON) 4 MG tablet 2 tablets by mouth twice a day the day before, day of and day after the chemotherapy every 3 weeks.  . diphenhydrAMINE (BENADRYL) 25 mg capsule Take 25 mg by mouth every 6 (six) hours as needed for itching. Reported on 01/19/2016  . hydroxypropyl methylcellulose / hypromellose (ISOPTO TEARS / GONIOVISC) 2.5 % ophthalmic solution 1 drop.  . Lidocaine (ASPERCREME LIDOCAINE) 4 % PTCH Apply 1 patch topically daily as needed. Place on back as needed for pain  . lidocaine-prilocaine (EMLA) cream Apply 1 application topically as needed. Apply 1 tsp over port site 1-1.5 hours prior to chemotherapy.  . loratadine (CLARITIN) 10 MG tablet Take 1 tablet (10 mg total) by mouth daily.  . mirtazapine (REMERON) 30 MG tablet Take 0.5 tablets (15 mg total) by mouth at bedtime as needed (for sleep).  . potassium chloride SA (K-DUR,KLOR-CON) 20 MEQ tablet TAKE 1 TABLET(20 MEQ) BY MOUTH TWICE DAILY  . prochlorperazine (COMPAZINE) 10 MG tablet Take 1 tablet (10 mg total) by mouth  every 6 (six) hours as needed for nausea or vomiting.  . traMADol (ULTRAM) 50 MG tablet TAKE 2 TABLETS BY MOUTH EVERY 6 HOURS AS NEEDED  . umeclidinium-vilanterol (ANORO ELLIPTA) 62.5-25 MCG/INH AEPB Inhale 1 puff into the lungs daily.  . VENTOLIN HFA 108 (90 Base) MCG/ACT inhaler INHALE 2 PUFFS BY MOUTH EVERY 6 HOURS AS NEEDED FOR WHEEZE OR SHORTNESS OF BREATH  . amLODipine (NORVASC) 5 MG tablet Take 1 tablet (5 mg total) by mouth daily. (Patient not taking: Reported on 05/05/2017)  . amoxicillin-clavulanate (AUGMENTIN) 875-125 MG tablet Take 1 tablet by mouth 2 (two) times daily. Take with food  . umeclidinium-vilanterol (ANORO ELLIPTA) 62.5-25 MCG/INH AEPB Inhale 1 puff into the lungs daily.  . [DISCONTINUED] doxycycline (VIBRA-TABS) 100 MG tablet Take 1 tablet (100 mg total) by mouth 2 (two) times daily. (  Patient not taking: Reported on 05/05/2017)  . [DISCONTINUED] predniSONE (DELTASONE) 10 MG tablet 4 tabs for 2 days, then 3 tabs for 2 days, 2 tabs for 2 days, then 1 tab for 2 days, then stop (Patient not taking: Reported on 05/05/2017)   Facility-Administered Encounter Medications as of 05/05/2017  Medication  . heparin lock flush 100 unit/mL  . sodium chloride 0.9 % injection 10 mL  . sodium chloride 0.9 % injection 10 mL     Review of Systems  Constitutional:   No  weight loss, night sweats,  Fevers, chills, + fatigue, or  lassitude.  HEENT:   No headaches,  Difficulty swallowing,  Tooth/dental problems, or  Sore throat,                No sneezing, itching, ear ache, nasal congestion, post nasal drip,   CV:  No chest pain,  Orthopnea, PND, swelling in lower extremities, anasarca, dizziness, palpitations, syncope.   GI  No heartburn, indigestion, abdominal pain, nausea, vomiting, diarrhea, change in bowel habits, loss of appetite, bloody stools.   Resp:    No chest wall deformity  Skin: no rash or lesions.  GU: no dysuria, change in color of urine, no urgency or frequency.  No  flank pain, no hematuria   MS:  No joint pain or swelling.  No decreased range of motion.  No back pain.    Physical Exam  BP 104/62 (BP Location: Left Arm, Cuff Size: Normal)   Pulse (!) 109   Ht 5\' 5"  (1.651 m)   Wt 145 lb 3.2 oz (65.9 kg)   SpO2 95%   BMI 24.16 kg/m   GEN: A/Ox3; pleasant , NAD, frail , elderly in wc    HEENT:  [place]/AT,  EACs-clear, TMs-wnl, NOSE-clear, THROAT-clear, no lesions, no postnasal drip or exudate noted.   NECK:  Supple w/ fair ROM; no JVD; normal carotid impulses w/o bruits; no thyromegaly or nodules palpated; no lymphadenopathy.    RESP  Clear  P & A; w/o, wheezes/ rales/ or rhonchi. no accessory muscle use, no dullness to percussion  CARD:  RRR, no m/r/g, no peripheral edema, pulses intact, no cyanosis or clubbing.  GI:   Soft & nt; nml bowel sounds; no organomegaly or masses detected.   Musco: Warm bil, no deformities or joint swelling noted.   Neuro: alert, no focal deficits noted.    Skin: Warm, no lesions or rashes    Lab Results:  CBC  BMET  ProBNP No results found for: PROBNP  Imaging: Dg Chest 2 View  Result Date: 05/05/2017 CLINICAL DATA:  Follow-up pneumonia. Persistent cough and chest congestion. Recently discontinued smoking. History of COPD and stage IV lung malignancy. EXAM: CHEST  2 VIEW COMPARISON:  PA and lateral chest x-ray of April 21, 2017 FINDINGS: The lungs are well-expanded. There is stable density in the left upper lobe with a cavity observed containing a small air in fluid level. There is stable patchy density in the right upper lobe. The mid and lower lungs are clear. There is retraction of the left hilar structures superiorly which is stable. The heart and pulmonary vascularity are normal. The power port catheter tip projects over the midportion of the SVC. There is calcification in the wall of the aortic arch. There are post kyphoplasty changes in the upper thoracic spine. IMPRESSION: COPD. No acute  pneumonia nor CHF. Persistent parenchymal abnormalities in both upper lobes. The amount of fluid in the cavitary left upper lobe lesion  has decreased. Fairly stable parenchymal density in the right upper lobe. Thoracic aortic atherosclerosis. Electronically Signed   By: David  Martinique M.D.   On: 05/05/2017 10:09   Dg Chest 2 View  Result Date: 04/21/2017 CLINICAL DATA:  Cough, congestion, short of breath for 2-3 weeks, history of lung carcinoma EXAM: CHEST  2 VIEW COMPARISON:  CT chest of 02/05/2017 and chest x-ray of the same day FINDINGS: There is now more opacity within the left upper lobe to the apex with pleural nodular thickening and air-fluid level present. If there has been no intervention thin this cavitary lesion is worrisome for either and infectious or inflammatory process versus neoplastic process. Some retraction left hilum is noted cephalad due to scarring. Vague opacity is present in the right upper lobe which could represent scarring but followup is recommended. No pleural effusion is seen. Right-sided Port-A-Cath tip overlies the mid lower SVC. Mediastinal and hilar contours otherwise are unremarkable. Compressed upper thoracic vertebral bodies are noted with vertebroplasties present. IMPRESSION: 1. Increased opacity within the left upper lobe to the left lung apex with air-fluid level and nodular pleural thickening. This may be due to inflammatory or infectious process with cavitation although recurrence of lung carcinoma is a definite consideration. 2. Vague opacity in the right upper lobe. Possible scarring but recommend attention to this area on followup chest x-ray. Electronically Signed   By: Ivar Drape M.D.   On: 04/21/2017 12:58     Assessment & Plan:   COPD (chronic obstructive pulmonary disease) (HCC) Recent flare with associated PNA - slowly resolving  Hold on additional steroids .   Plan  Cont on ANORO .   PNA (pneumonia) Suspected LUL PNA - LUL opacity with air fluid  level. Difficult to determine if this from the PNA in July or new process.  She is immunosuppressed with recent Chemo last in August, recent steroids . She is higher risk for atypicals infections . However doubt she can get sputum cx /fungal specimen today .  Will treat empirically with Abx with broad spectrum -augmentin . Check CT chest to evaluate area and compare to July CT chest .  If not improving may need to induce sputum culture vs FOB   Plan  Patient Instructions  Begin Augmentin 875mg  Twice daily  For 2 weeks .-take with food . Eat yogurt   Continue on ANORO daily , rinse after use.  Mucinex DM Twice daily As needed  Cough/congestion  Continue on ANORO daily .  Set up CT chest.  Labs today .  Follow up with Oncology as planned .  Follow up Dr. Elsworth Soho  In  3-4   months and As needed   .Please contact office for sooner follow up if symptoms do not improve or worsen or seek emergency care         Rexene Edison, NP 05/05/2017

## 2017-05-05 NOTE — Assessment & Plan Note (Signed)
Recent flare with associated PNA - slowly resolving  Hold on additional steroids .   Plan  Cont on ANORO .

## 2017-05-06 ENCOUNTER — Telehealth: Payer: Self-pay | Admitting: Adult Health

## 2017-05-06 NOTE — Telephone Encounter (Signed)
Per TP:  Follow up with RA should have been in 3-4 weeks, not 3-4 months.  Also thought she has wanted to have her CT done yesterday so that she wouldn't have to travel back down here from [place]?  RA does have openings in 3 weeks LMOM TCB x1

## 2017-05-07 ENCOUNTER — Telehealth: Payer: Self-pay | Admitting: Adult Health

## 2017-05-07 NOTE — Telephone Encounter (Signed)
Per TP:  Follow up with RA should have been in 3-4 weeks, not 3-4 months.  Also thought she has wanted to have her CT done yesterday so that she wouldn't have to travel back down here from [place]?  RA does have openings in 3 weeks LMOM TCB x1 -----------  Spoke with pt, rescheduled rov with RA.  Pt states she will leave her CT as scheduled.  Nothing further needed.

## 2017-05-14 ENCOUNTER — Ambulatory Visit (INDEPENDENT_AMBULATORY_CARE_PROVIDER_SITE_OTHER)
Admission: RE | Admit: 2017-05-14 | Discharge: 2017-05-14 | Disposition: A | Discharge status: Home / Self Care | Payer: Medicare Other | Source: Ambulatory Visit | Attending: Adult Health | Admitting: Adult Health

## 2017-05-14 ENCOUNTER — Other Ambulatory Visit: Payer: Medicare Other

## 2017-05-14 DIAGNOSIS — J984 Other disorders of lung: Secondary | ICD-10-CM

## 2017-05-14 DIAGNOSIS — J189 Pneumonia, unspecified organism: Secondary | ICD-10-CM | POA: Diagnosis not present

## 2017-05-14 MED ORDER — IOPAMIDOL (ISOVUE-300) INJECTION 61%
80.0000 mL | Freq: Once | INTRAVENOUS | Status: AC | PRN
  Administered 2017-05-14: 80 mL via INTRAVENOUS

## 2017-05-19 ENCOUNTER — Telehealth: Payer: Self-pay

## 2017-05-19 NOTE — Progress Notes (Signed)
Called spoke with patient to discuss results/recs.  Pt voiced her understanding.  She lives in [place] and it's very difficult for her to travel to [place], especially at the end of the month.  She is scheduled for CT abd/pelvis on 10/30 and appt w/ Dr Julien Nordmann on 11/1.  Pt stated she is unable to come for additional appt with RA.  Will discuss with TP to make her aware.

## 2017-05-19 NOTE — Telephone Encounter (Signed)
Pt called went Thursday for CT Chest for Dr Elsworth Soho office (result in epic). Does she need to do the chest abdomen pelvis Dr Julien Nordmann ordered for 10/30.   Please call pt with instructions.

## 2017-05-19 NOTE — Telephone Encounter (Signed)
I left VM that pt needs to have Ct abd/pelvis.

## 2017-05-26 ENCOUNTER — Other Ambulatory Visit: Payer: Self-pay | Admitting: Medical Oncology

## 2017-05-26 ENCOUNTER — Ambulatory Visit (HOSPITAL_COMMUNITY)
Admission: RE | Admit: 2017-05-26 | Discharge: 2017-05-26 | Disposition: A | Discharge status: Home / Self Care | Payer: Medicare Other | Source: Ambulatory Visit | Attending: Internal Medicine | Admitting: Internal Medicine

## 2017-05-26 ENCOUNTER — Other Ambulatory Visit (HOSPITAL_BASED_OUTPATIENT_CLINIC_OR_DEPARTMENT_OTHER): Payer: Medicare Other

## 2017-05-26 DIAGNOSIS — C3412 Malignant neoplasm of upper lobe, left bronchus or lung: Secondary | ICD-10-CM

## 2017-05-26 DIAGNOSIS — M549 Dorsalgia, unspecified: Secondary | ICD-10-CM

## 2017-05-26 DIAGNOSIS — C3492 Malignant neoplasm of unspecified part of left bronchus or lung: Secondary | ICD-10-CM | POA: Diagnosis not present

## 2017-05-26 DIAGNOSIS — F411 Generalized anxiety disorder: Secondary | ICD-10-CM

## 2017-05-26 DIAGNOSIS — Z90710 Acquired absence of both cervix and uterus: Secondary | ICD-10-CM | POA: Diagnosis not present

## 2017-05-26 DIAGNOSIS — I77811 Abdominal aortic ectasia: Secondary | ICD-10-CM | POA: Diagnosis not present

## 2017-05-26 DIAGNOSIS — Z95828 Presence of other vascular implants and grafts: Secondary | ICD-10-CM

## 2017-05-26 DIAGNOSIS — Z5111 Encounter for antineoplastic chemotherapy: Secondary | ICD-10-CM

## 2017-05-26 DIAGNOSIS — G8929 Other chronic pain: Secondary | ICD-10-CM

## 2017-05-26 DIAGNOSIS — I700 Atherosclerosis of aorta: Secondary | ICD-10-CM | POA: Diagnosis not present

## 2017-05-26 LAB — CBC WITH DIFFERENTIAL/PLATELET
BASO%: 0.4 % (ref 0.0–2.0)
BASOS ABS: 0 10*3/uL (ref 0.0–0.1)
EOS%: 1.2 % (ref 0.0–7.0)
Eosinophils Absolute: 0.1 10*3/uL (ref 0.0–0.5)
HEMATOCRIT: 39.4 % (ref 34.8–46.6)
HEMOGLOBIN: 12.2 g/dL (ref 11.6–15.9)
LYMPH#: 0.7 10*3/uL — AB (ref 0.9–3.3)
LYMPH%: 8.6 % — ABNORMAL LOW (ref 14.0–49.7)
MCH: 26.6 pg (ref 25.1–34.0)
MCHC: 31 g/dL — ABNORMAL LOW (ref 31.5–36.0)
MCV: 85.8 fL (ref 79.5–101.0)
MONO#: 0.9 10*3/uL (ref 0.1–0.9)
MONO%: 10.7 % (ref 0.0–14.0)
NEUT%: 79.1 % — ABNORMAL HIGH (ref 38.4–76.8)
NEUTROS ABS: 6.4 10*3/uL (ref 1.5–6.5)
PLATELETS: 456 10*3/uL — AB (ref 145–400)
RBC: 4.59 10*6/uL (ref 3.70–5.45)
RDW: 19.7 % — AB (ref 11.2–14.5)
WBC: 8.1 10*3/uL (ref 3.9–10.3)

## 2017-05-26 LAB — COMPREHENSIVE METABOLIC PANEL
ALT: <6 U/L (ref 0–55)
ANION GAP: 15 meq/L — AB (ref 3–11)
AST: 10 U/L (ref 5–34)
Albumin: 2.9 g/dL — ABNORMAL LOW (ref 3.5–5.0)
Alkaline Phosphatase: 158 U/L — ABNORMAL HIGH (ref 40–150)
BUN: 5.4 mg/dL — ABNORMAL LOW (ref 7.0–26.0)
CALCIUM: 10 mg/dL (ref 8.4–10.4)
CHLORIDE: 100 meq/L (ref 98–109)
CO2: 23 meq/L (ref 22–29)
Creatinine: 0.6 mg/dL (ref 0.6–1.1)
EGFR: >60 mL/min/{1.73_m2} (ref >60)
Glucose: 112 mg/dl (ref 70–140)
POTASSIUM: 3.5 meq/L (ref 3.5–5.1)
Sodium: 138 mEq/L (ref 136–145)
Total Bilirubin: 0.27 mg/dL (ref 0.20–1.20)
Total Protein: 7.5 g/dL (ref 6.4–8.3)

## 2017-05-28 ENCOUNTER — Ambulatory Visit (HOSPITAL_BASED_OUTPATIENT_CLINIC_OR_DEPARTMENT_OTHER): Payer: Medicare Other | Admitting: Internal Medicine

## 2017-05-28 ENCOUNTER — Encounter: Payer: Self-pay | Admitting: Internal Medicine

## 2017-05-28 ENCOUNTER — Telehealth: Payer: Self-pay | Admitting: Internal Medicine

## 2017-05-28 VITALS — BP 115/74 | HR 119 | Temp 98.9°F | Resp 20 | Ht 65.0 in | Wt 137.3 lb

## 2017-05-28 DIAGNOSIS — Z95828 Presence of other vascular implants and grafts: Secondary | ICD-10-CM

## 2017-05-28 DIAGNOSIS — R634 Abnormal weight loss: Secondary | ICD-10-CM | POA: Diagnosis not present

## 2017-05-28 DIAGNOSIS — Z23 Encounter for immunization: Secondary | ICD-10-CM | POA: Diagnosis not present

## 2017-05-28 DIAGNOSIS — F419 Anxiety disorder, unspecified: Secondary | ICD-10-CM

## 2017-05-28 DIAGNOSIS — F411 Generalized anxiety disorder: Secondary | ICD-10-CM

## 2017-05-28 DIAGNOSIS — R5382 Chronic fatigue, unspecified: Secondary | ICD-10-CM

## 2017-05-28 DIAGNOSIS — C3492 Malignant neoplasm of unspecified part of left bronchus or lung: Secondary | ICD-10-CM

## 2017-05-28 DIAGNOSIS — G8929 Other chronic pain: Secondary | ICD-10-CM

## 2017-05-28 DIAGNOSIS — C3412 Malignant neoplasm of upper lobe, left bronchus or lung: Secondary | ICD-10-CM

## 2017-05-28 DIAGNOSIS — Z5111 Encounter for antineoplastic chemotherapy: Secondary | ICD-10-CM

## 2017-05-28 DIAGNOSIS — M549 Dorsalgia, unspecified: Secondary | ICD-10-CM

## 2017-05-28 MED ORDER — PROCHLORPERAZINE MALEATE 10 MG PO TABS: 10.0000 mg | ORAL_TABLET | Freq: Four times a day (QID) | ORAL | 0 refills | Status: AC | PRN

## 2017-05-28 MED ORDER — MIRTAZAPINE 30 MG PO TABS: 15.0000 mg | ORAL_TABLET | Freq: Every evening | ORAL | 2 refills | Status: AC | PRN

## 2017-05-28 MED ORDER — PROCHLORPERAZINE MALEATE 10 MG PO TABS: 10.0000 mg | ORAL_TABLET | Freq: Four times a day (QID) | ORAL | 0 refills | Status: DC | PRN

## 2017-05-28 MED ORDER — INFLUENZA VAC SPLIT QUAD 0.5 ML IM SUSY
0.5000 mL | PREFILLED_SYRINGE | Freq: Once | INTRAMUSCULAR | Status: AC
  Administered 2017-05-28: 0.5 mL via INTRAMUSCULAR
  Filled 2017-05-28: qty 0.5

## 2017-05-28 MED ORDER — MIRTAZAPINE 30 MG PO TABS: 15.0000 mg | ORAL_TABLET | Freq: Every evening | ORAL | 2 refills | Status: DC | PRN

## 2017-05-28 MED ORDER — ALPRAZOLAM 0.25 MG PO TABS: 0.2500 mg | ORAL_TABLET | Freq: Two times a day (BID) | ORAL | 0 refills | Status: AC | PRN

## 2017-05-28 NOTE — Progress Notes (Signed)
[facility] [phone]   [phone]  OFFICE PROGRESS NOTE  Scot Jun, FNP [place] [address]  DIAGNOSIS: Stage IV (T2a, N0, M1a) non-small cell lung cancer, adenocarcinoma presented with bilateral pulmonary lesions. This could be also consider as synchronous primary tumors.  PRIOR THERAPY:  1) Systemic chemotherapy with carboplatin for AUC of 5 and Alimta 500 MG/M2 every 3 weeks. Status post 6 cycles. 2) Treatment according to the BMS checkmate 370 clinical trial. Randomized to group A arm B1 treatment with pemetrexed (Alimta) 500 mg/m given every 3 weeks. Status post 6 cycles discontinued secondary to disease progression. 3) Tecentriq 1200 mg IV every 3 weeks status post 6 cycles discontinued secondary to significant skin rash. 4) palliative radiotherapy to the left upper lobe lung mass under the care of Dr. Lisbeth Renshaw completed on 11/16/2015. 5) Systemic chemotherapy with docetaxel 75 MG/M2 and Cyramza 10 MG/KG every 3 weeks with Neulasta support. First dose 08/25/2016. Status post 8 cycles.  Last dose was given on 01/20/2017. starting from cycle #6, I would reduce the dose of docetaxel to 65 MG/M2 secondary to intolerance.   CURRENT THERAPY: Immunotherapy with Nivolumab 480 mg IV every 4 weeks.  First dose June 09, 2017.  INTERVAL HISTORY: Heather Banks 66 y.o. female returns to the clinic today for follow-up visit accompanied by friend.  The patient continues to complain of increasing fatigue and weakness.  She lost around 8 pounds since her last visit.  She is currently on Remeron for depression and to stimulate her appetite but she was out of her medication.  She denied having any chest pain but continues to have shortness of breath at baseline increased with exertion with mild cough and no hemoptysis.  She denied having any recent fever or chills.  She has no nausea, vomiting, diarrhea or constipation.  She was treated for  an episode of pneumonia a few weeks ago.  The patient had repeat CT scan of the chest, abdomen and pelvis performed recently and she is here for evaluation and discussion of her scan results.   MEDICAL HISTORY: Past Medical History:  Diagnosis Date  . Adenocarcinoma of left lung, stage 4 ([facility]) 07/09/2014  . Compression fracture of thoracic vertebra (H[facility]) 04/21/2016  . COPD (chronic obstructive pulmonary disease) (HCC)    Albuterol neb as needed;Pulmicort neb daily  . Encounter for antineoplastic chemotherapy 01/21/2015  . Full code status 05/01/2015  . GERD (gastroesophageal reflux disease)    takes Pantoprazole and Zantac daily  . History of bronchitis    >5 yrs ago  . History of migraine    last one 2 wks ago  . Hx of radiation therapy 10/29/2015 to 11/16/2015   The Left upper lobe was treated to 37.5 Gy in 15 fractions at 2.5 Gy per fraction  . Lung mass   . Panic attacks    but doesn't take any meds  . Pneumonia    hx of-last time about 4+yrs ago    ALLERGIES:  is allergic to ativan [lorazepam] and cinnamon.  MEDICATIONS:  Current Outpatient Prescriptions  Medication Sig Dispense Refill  . ALPRAZolam (XANAX) 0.25 MG tablet Take 1 tablet (0.25 mg total) by mouth 2 (two) times daily as needed for anxiety. 40 tablet 0  . amLODipine (NORVASC) 5 MG tablet Take 1 tablet (5 mg total) by mouth daily. (Patient not taking: Reported on 05/05/2017) 90 tablet 1  . amoxicillin-clavulanate (AUGMENTIN) 875-125 MG tablet Take 1  tablet by mouth 2 (two) times daily. Take with food 28 tablet 0  . calcium carbonate (OS-CAL - DOSED IN MG OF ELEMENTAL CALCIUM) 1250 (500 Ca) MG tablet Take 1 tablet by mouth daily with breakfast.    . cholecalciferol (VITAMIN D) 1000 units tablet Take 1,000 Units by mouth daily.    Marland Kitchen dexamethasone (DECADRON) 4 MG tablet 2 tablets by mouth twice a day the day before, day of and day after the chemotherapy every 3 weeks. 40 tablet 1  . diphenhydrAMINE (BENADRYL) 25 mg  capsule Take 25 mg by mouth every 6 (six) hours as needed for itching. Reported on 01/19/2016    . hydroxypropyl methylcellulose / hypromellose (ISOPTO TEARS / GONIOVISC) 2.5 % ophthalmic solution 1 drop.    . Lidocaine (ASPERCREME LIDOCAINE) 4 % PTCH Apply 1 patch topically daily as needed. Place on back as needed for pain    . lidocaine-prilocaine (EMLA) cream Apply 1 application topically as needed. Apply 1 tsp over port site 1-1.5 hours prior to chemotherapy. 30 g 0  . loratadine (CLARITIN) 10 MG tablet Take 1 tablet (10 mg total) by mouth daily. 15 tablet 0  . mirtazapine (REMERON) 30 MG tablet Take 0.5 tablets (15 mg total) by mouth at bedtime as needed (for sleep). 30 tablet 2  . potassium chloride SA (K-DUR,KLOR-CON) 20 MEQ tablet TAKE 1 TABLET(20 MEQ) BY MOUTH TWICE DAILY 20 tablet 0  . prochlorperazine (COMPAZINE) 10 MG tablet Take 1 tablet (10 mg total) by mouth every 6 (six) hours as needed for nausea or vomiting. 60 tablet 0  . traMADol (ULTRAM) 50 MG tablet TAKE 2 TABLETS BY MOUTH EVERY 6 HOURS AS NEEDED 60 tablet 0  . umeclidinium-vilanterol (ANORO ELLIPTA) 62.5-25 MCG/INH AEPB Inhale 1 puff into the lungs daily. 30 each 5  . umeclidinium-vilanterol (ANORO ELLIPTA) 62.5-25 MCG/INH AEPB Inhale 1 puff into the lungs daily. 2 each 0  . VENTOLIN HFA 108 (90 Base) MCG/ACT inhaler INHALE 2 PUFFS BY MOUTH EVERY 6 HOURS AS NEEDED FOR WHEEZE OR SHORTNESS OF BREATH 18 g 5   No current facility-administered medications for this visit.    Facility-Administered Medications Ordered in Other Visits  Medication Dose Route Frequency Provider Last Rate Last Dose  . heparin lock flush 100 unit/mL  500 Units Intracatheter Once PRN Curt Bears, MD      . sodium chloride 0.9 % injection 10 mL  10 mL Intracatheter PRN Curt Bears, MD   10 mL at 07/31/15 1635  . sodium chloride 0.9 % injection 10 mL  10 mL Intracatheter PRN Curt Bears, MD        SURGICAL HISTORY:  Past Surgical History:    Procedure Laterality Date  . ABDOMINAL HYSTERECTOMY    . APPENDECTOMY    . IR GENERIC HISTORICAL  05/02/2016   IR RADIOLOGIST EVAL & MGMT 05/02/2016 [facility]  . IR GENERIC HISTORICAL  05/08/2016   IR VERTEBROPLASTY EA ADDL (T&LS) BX INC UNI/BIL INC INJECT/IMAGING 05/08/2016 Luanne Bras, MD [facility]  . IR GENERIC HISTORICAL  05/08/2016   IR VERTEBROPLASTY CERV/THOR BX INC UNI/BIL INC/INJECT/IMAGING 05/08/2016 Luanne Bras, MD [facility]  . IR GENERIC HISTORICAL  05/29/2016   IR RADIOLOGIST EVAL & MGMT 05/29/2016 [facility]  . OOPHORECTOMY    . PORTACATH PLACEMENT  jan. 2016  . TONSILLECTOMY    . VIDEO BRONCHOSCOPY WITH ENDOBRONCHIAL NAVIGATION N/A 08/14/2014   Procedure: VIDEO BRONCHOSCOPY WITH ENDOBRONCHIAL NAVIGATION;  Surgeon: Rigoberto Noel, MD;  Location: [facility];  Service:  Thoracic;  Laterality: N/A;    REVIEW OF SYSTEMS:  Constitutional: positive for anorexia, fatigue and weight loss Eyes: negative Ears, nose, mouth, throat, and face: negative Respiratory: positive for cough and dyspnea on exertion Cardiovascular: negative Gastrointestinal: negative Genitourinary:negative Integument/breast: negative Hematologic/lymphatic: negative Musculoskeletal:positive for arthralgias and back pain Neurological: positive for headaches Behavioral/Psych: negative Endocrine: negative Allergic/Immunologic: negative   PHYSICAL EXAMINATION: General appearance: alert, cooperative, fatigued and no distress Head: Normocephalic, without obvious abnormality, atraumatic Neck: no adenopathy, no JVD, supple, symmetrical, trachea midline and thyroid not enlarged, symmetric, no tenderness/mass/nodules Lymph nodes: Cervical, supraclavicular, and axillary nodes normal. Resp: wheezes bilaterally Back: symmetric, no curvature. ROM normal. No CVA tenderness. Cardio: regular rate and rhythm, S1, S2 normal, no murmur, click, rub or gallop GI: soft, non-tender; bowel sounds normal; no  masses,  no organomegaly Extremities: extremities normal, atraumatic, no cyanosis or edema Neurologic: Alert and oriented X 3, normal strength and tone. Normal symmetric reflexes. Normal coordination and gait   ECOG PERFORMANCE STATUS: 1 - Symptomatic but completely ambulatory  Blood pressure 115/74, pulse (!) 119, temperature 98.9 F (37.2 C), temperature source Oral, resp. rate 20, height 5\' 5"  (1.651 m), weight 137 lb 4.8 oz (62.3 kg), SpO2 95 %.  LABORATORY DATA: Lab Results  Component Value Date   WBC 8.1 05/26/2017   HGB 12.2 05/26/2017   HCT 39.4 05/26/2017   MCV 85.8 05/26/2017   PLT 456 (H) 05/26/2017      Chemistry      Component Value Date/Time   NA 138 05/26/2017 0813   K 3.5 05/26/2017 0813   CL 103 05/05/2017 1136   CO2 23 05/26/2017 0813   BUN 5.4 (L) 05/26/2017 0813   CREATININE 0.6 05/26/2017 0813      Component Value Date/Time   CALCIUM 10.0 05/26/2017 0813   ALKPHOS 158 (H) 05/26/2017 0813   AST 10 05/26/2017 0813   ALT <6 05/26/2017 0813   BILITOT 0.27 05/26/2017 0813       RADIOGRAPHIC STUDIES: Ct Abdomen Pelvis Wo Contrast  Result Date: 05/26/2017 CLINICAL DATA:  Metastatic left lung cancer. Chemotherapy completed July 2018. EXAM: CT ABDOMEN AND PELVIS WITHOUT CONTRAST TECHNIQUE: Multidetector CT imaging of the abdomen and pelvis was performed following the standard protocol without IV contrast. COMPARISON:  02/05/2017 FINDINGS: Lower chest: Faint peripheral opacity in the right middle lobe anteriorly favoring scarring. Minimal residual reticular opacity medially in the left lower lobe. Hepatobiliary: Unremarkable Pancreas: Unremarkable Spleen: Unremarkable Adrenals/Urinary Tract: Adrenal glands normal. Exophytic fluid density lesions of the right kidney lower pole are likely cysts. Vascular calcifications in both renal hila in addition to several small punctate nonobstructive renal calculi. No hydronephrosis, hydroureter, or ureteral or bladder  calculus. Stomach/Bowel: Unremarkable Vascular/Lymphatic: Aortoiliac atherosclerotic vascular disease. Infrarenal abdominal aortic ectasia. No pathologic adenopathy. Reproductive: Uterus absent.  Adnexa unremarkable. Other: No supplemental non-categorized findings. Musculoskeletal: Mild degenerative arthropathy of the hips. IMPRESSION: 1. No findings of intra-abdominal metastatic disease on today's noncontrast abdomen/pelvis CT. 2. Suspected small nonobstructive bilateral renal calculi. 3.  Aortic Atherosclerosis (ICD10-I70.0). Electronically Signed   By: Van Clines M.D.   On: 05/26/2017 11:58   Dg Chest 2 View  Result Date: 05/05/2017 CLINICAL DATA:  Follow-up pneumonia. Persistent cough and chest congestion. Recently discontinued smoking. History of COPD and stage IV lung malignancy. EXAM: CHEST  2 VIEW COMPARISON:  PA and lateral chest x-ray of April 21, 2017 FINDINGS: The lungs are well-expanded. There is stable density in the left upper lobe with a cavity observed containing  a small air in fluid level. There is stable patchy density in the right upper lobe. The mid and lower lungs are clear. There is retraction of the left hilar structures superiorly which is stable. The heart and pulmonary vascularity are normal. The power port catheter tip projects over the midportion of the SVC. There is calcification in the wall of the aortic arch. There are post kyphoplasty changes in the upper thoracic spine. IMPRESSION: COPD. No acute pneumonia nor CHF. Persistent parenchymal abnormalities in both upper lobes. The amount of fluid in the cavitary left upper lobe lesion has decreased. Fairly stable parenchymal density in the right upper lobe. Thoracic aortic atherosclerosis. Electronically Signed   By: David  Martinique M.D.   On: 05/05/2017 10:09   Ct Chest W Contrast  Result Date: 05/14/2017 CLINICAL DATA:  66 year old female with history of cavitary pneumonia. Additional head history of adenocarcinoma  in the left lung status post radiation therapy. Follow-up study. EXAM: CT CHEST WITH CONTRAST TECHNIQUE: Multidetector CT imaging of the chest was performed during intravenous contrast administration. CONTRAST:  54mL ISOVUE-300 IOPAMIDOL (ISOVUE-300) INJECTION 61% COMPARISON:  Multiple priors, most recently chest CT 02/05/2017. FINDINGS: Cardiovascular: Heart size is normal. There is no significant pericardial fluid, thickening or pericardial calcification. There is aortic atherosclerosis, as well as atherosclerosis of the great vessels of the mediastinum and the coronary arteries, including calcified atherosclerotic plaque in the left main, left anterior descending left circumflex and right coronary arteries. Right internal jugular single-lumen porta cath with tip terminating at the superior cavoatrial junction Mediastinum/Nodes: Abnormal soft tissue in the left hilar region contiguous with large mass in the posterior aspect of the left upper lobe, presumably some of malignant nodal tissue exists in this region. No other definite right hilar or mediastinal lymphadenopathy. Esophagus is unremarkable in appearance. No axillary lymphadenopathy. Lungs/Pleura: Previously noted left upper lobe mass abutting the superior aspect of the left major fissure continues to enlarge progressively over prior examinations, presumably locally recurrent disease (this was the site of the originally treated left upper lobe neoplasm), currently measuring 2.9 x 4.7 cm on today's study (axial image 23 of series 2). Likewise, the 2 nodular lesions adjacent to one another in the right upper lobe directly correspond to the originally treated right upper lobe neoplasm. These lesions also appear larger than prior examinations, currently measuring 1.5 x 2.0 cm (axial image 31 of series 3) as compared with 1.6 x 1.1 cm on 02/05/2017, and the thick walled cavitary lesion currently measures approximately 1.9 x 2.6 cm (axial image 35 of series 3),  previously only 1.2 x 1.6 cm on 02/05/2017. In the superior segment of the left lower lobe there is a new large cavitary area which has thick peripheral walls, and appears to have some dependent internal debris, presumably a cavity secondary to prior necrotizing pneumonia. In the anterior aspect of the right upper lobe there is again an irregular-shaped area of ground-glass attenuation, septal thickening and architectural distortion currently measuring 3.3 cm with, which could represent an area of post infectious or inflammatory scarring, however, additional focus of adenocarcinoma is not excluded. A few other scattered tiny pulmonary nodules are noted in lungs bilaterally, nonspecific but similar to prior examinations. Small amount of residual airspace consolidation noted in the medial aspect of the left lower lobe inferior to the large cavity. No pleural effusions. Upper Abdomen: Subcentimeter low-attenuation lesion in segment 8 of the liver is too small to definitively characterize. Aortic atherosclerosis. Musculoskeletal: Old compression fractures of T3 and T4,  most severe at T4 where there is approximately 60% loss of anterior vertebral body height, with post vertebroplasty changes at both levels, similar to the prior examination. There are no aggressive appearing lytic or blastic lesions noted in the visualized portions of the skeleton. IMPRESSION: 1. Continued interval enlargement of large left upper lobe mass and right upper lobe nodules compared to prior studies, most compatible with residual/recurrent disease in this patient with history of lung cancer. 2. Continued enlargement of an irregular shaped area of ground-glass attenuation, septal thickening and mass-like architectural distortion in the right upper lobe suspicious for potential additional focus of adenocarcinoma (less likely chronic scarring). 3. Small amount of airspace consolidation in the medial aspect of the left lower lobe, with a new large  thick-walled cavitary lesion in the superior segment of the left lower lobe which contains internal debris (presumably necrotic sloughed lung tissue), presumably sequela of necrotizing pneumonia. 4. Aortic atherosclerosis, in addition to left main and 3 vessel coronary artery disease. Please note that although the presence of coronary artery calcium documents the presence of coronary artery disease, the severity of this disease and any potential stenosis cannot be assessed on this non-gated CT examination. Assessment for potential risk factor modification, dietary therapy or pharmacologic therapy may be warranted, if clinically indicated. 5. Additional incidental findings, as above. Aortic Atherosclerosis (ICD10-I70.0). Electronically Signed   By: Vinnie Langton M.D.   On: 05/14/2017 15:15    ASSESSMENT AND PLAN:  This is a very pleasant 66 years old white female with a stage IV non-small cell lung cancer, adenocarcinoma status post several regimens of systemic chemotherapy as well as immunotherapy. Currently undergoing systemic chemotherapy with reduced dose docetaxel 65 MG/M2 and Cyramza 10 MG/KG every 3 weeks status post 8 cycles. She had rough time with the last few cycles of her treatment with increasing fatigue and weakness as well as peripheral neuropathy and weight loss. Her treatment was discontinued and the patient was on observation. She had repeat CT scan of the chest, abdomen and pelvis performed recently.  I personally and independently reviewed the scan images and discussed the results with the patient today. Unfortunately the scan of the chest showed evidence for progression of the pulmonary nodules. I had a lengthy discussion with the patient about her treatment options including palliative care versus consideration of resuming her treatment with docetaxel and Cyramza versus treatment with immunotherapy again. The patient mentioned that she would not be able to return back to her  previous systemic chemotherapy because of the significant adverse effects.  She would like to try the treatment with immunotherapy.  Of note she did not have disease progression on the immunotherapy but this was discontinued secondary to a skin rash at that time. I discussed with her treatment with Nivolumab 480 mg IV every 4 weeks.  I reminded the patient was the adverse effect of the immunotherapy including but not limited to immunotherapy mediated to skin rash, diarrhea, inflammation of the lung, kidney, liver, thyroid or other endocrine dysfunction. She is expected to start if it is dose of this treatment on June 09, 2017. For the weight loss and depression, I give the patient refill of Remeron today. For the anxiety, I give her refill of Xanax. I also gave the patient refill for Compazine for nausea. She will come back for follow-up visit in 5 weeks with the start of cycle #2 of her treatment. She was advised to call immediately if she has any concerning symptoms in the interval. The  patient voices understanding of current disease status and treatment options and is in agreement with the current care plan. All questions were answered. The patient knows to call the clinic with any problems, questions or concerns. We can certainly see the patient much sooner if necessary. Disclaimer: This note was dictated with voice recognition software. Similar sounding words can inadvertently be transcribed and may not be corrected upon review.

## 2017-05-28 NOTE — Addendum Note (Signed)
Addended by: Ardeen Garland on: 05/28/2017 10:56 AM   Modules accepted: Orders

## 2017-05-28 NOTE — Telephone Encounter (Signed)
Gave avs and calendar for November - January 2019

## 2017-06-08 ENCOUNTER — Ambulatory Visit (INDEPENDENT_AMBULATORY_CARE_PROVIDER_SITE_OTHER): Payer: Medicare Other | Admitting: Pulmonary Disease

## 2017-06-08 ENCOUNTER — Encounter: Payer: Self-pay | Admitting: Pulmonary Disease

## 2017-06-08 ENCOUNTER — Ambulatory Visit (INDEPENDENT_AMBULATORY_CARE_PROVIDER_SITE_OTHER)
Admission: RE | Admit: 2017-06-08 | Discharge: 2017-06-08 | Disposition: A | Discharge status: Home / Self Care | Payer: Medicare Other | Source: Ambulatory Visit | Attending: Pulmonary Disease | Admitting: Pulmonary Disease

## 2017-06-08 VITALS — BP 142/80 | HR 117 | Ht 65.0 in | Wt 140.0 lb

## 2017-06-08 DIAGNOSIS — R059 Cough, unspecified: Secondary | ICD-10-CM

## 2017-06-08 DIAGNOSIS — R05 Cough: Secondary | ICD-10-CM

## 2017-06-08 MED ORDER — ALBUTEROL SULFATE (2.5 MG/3ML) 0.083% IN NEBU: 2.5000 mg | INHALATION_SOLUTION | Freq: Four times a day (QID) | RESPIRATORY_TRACT | 4 refills | Status: AC | PRN

## 2017-06-08 MED ORDER — LEVOFLOXACIN 500 MG PO TABS: 500.0000 mg | ORAL_TABLET | Freq: Every day | ORAL | 0 refills | Status: AC

## 2017-06-08 NOTE — Assessment & Plan Note (Signed)
Try to take [facility] in the daytime. Refills on albuterol nebs.  No bronchospasm hence defer steroids

## 2017-06-08 NOTE — Assessment & Plan Note (Signed)
Not convinced that she is having a pneumonia.  Cavitation probably represents effective treatment of malignancy or worsening malignancy  Will treat her regardless.  Change in color of sputum and impending immunotherapy.   Chest x-ray today. Levaquin 500 mg daily for 5 days

## 2017-06-08 NOTE — Progress Notes (Signed)
   Subjective:    Patient ID: Heather Banks, female    DOB: [birth year], 66 y.o.   MRN: [id number]  HPI  66yo smoker with COPD on 3l home O2 and adenocarcinoma lung, stage IV. She presented 06/2014 with bilateral upper lobe lung mass w/ minmally enlarged hilar and mediastinal nodes, hypermetabolic on PET Underwent ENB guided biopsy , LUL bx was neg, RUL-2 targets showed adenoCA- felt to be synchronous tumors  Had disease progression on chemoRx x 6 cycles Completed immunotherapy  Palliative Radiotherapy in LUL mass completed in 10/2015  Serial imaging has shown cavitation in both upper lobe masses.  Unfortunately last CT scan in October showed disease progression.  She was treated doxycycline for chest congestion on her last visit with [place] in October. Seems to have cleared but now has come back and she complains of yellow sputum.  Denies fevers, she is scheduled for dose of salvage immunotherapy tomorrow  She has lost significant weight from 200-140 pounds and could not tolerate any more chemotherapy.  She is in a wheelchair today accompanied by her friend Jocelyn Lamer who helps take care of her    Significant tests/ events PFT 06/2014 FEV1 at 46%, ratio of 59. Diffusing capacity was decreased at 56% , FVC was 60%  6/2017CT chest showed a small PE in the left lateral basal segment of the pulmonary artery was treated withXarelto for 3 months Venous Doppler was negative.      Past Medical History:  Diagnosis Date  . Adenocarcinoma of left lung, stage 4 ([place]) 07/09/2014  . Compression fracture of thoracic vertebra ([facility]) 04/21/2016  . COPD (chronic obstructive pulmonary disease) ([facility])    Albuterol neb as needed;Pulmicort neb daily  . Encounter for antineoplastic chemotherapy 01/21/2015  . Full code status 05/01/2015  . GERD (gastroesophageal reflux disease)    takes Pantoprazole and Zantac daily  . History of bronchitis    >5 yrs ago  . History of migraine    last one 2 wks ago    . Hx of radiation therapy 10/29/2015 to 11/16/2015   The Left upper lobe was treated to 37.5 Gy in 15 fractions at 2.5 Gy per fraction  . Lung mass   . Panic attacks    but doesn't take any meds  . Pneumonia    hx of-last time about 4+yrs ago       Review of Systems neg for any significant sore throat, dysphagia, itching, sneezing, nasal congestion or excess/ purulent secretions, fever, chills, sweats, unintended wt loss, pleuritic or exertional cp, hempoptysis, orthopnea pnd or change in chronic leg swelling.   Also denies presyncope, palpitations, heartburn, abdominal pain, nausea, vomiting, diarrhea or change in bowel or urinary habits, dysuria,hematuria, rash, arthralgias, visual complaints, headache, numbness weakness or ataxia.     Objective:   Physical Exam  Gen. Pleasant, thin, in no distress ENT - no thrush, no post nasal drip Neck: No JVD, no thyromegaly, no carotid bruits Lungs: no use of accessory muscles, no dullness to percussion, decreased without rales or rhonchi  Cardiovascular: tachy, Rhythm regular, heart sounds  normal, no murmurs or gallops, no peripheral edema Musculoskeletal: No deformities, no cyanosis or clubbing        Assessment & Plan:

## 2017-06-08 NOTE — Patient Instructions (Signed)
Chest x-ray today. Levaquin 500 mg daily for 5 days  Try to take [facility] in the daytime. Refills on albuterol nebs.  Good luck with immunotherapy dose tomorrow

## 2017-06-08 NOTE — Assessment & Plan Note (Signed)
Can proceed with immunotherapy dose tomorrow No leukocytosis on previous labs, would repeat CBC prior to dose tomorrow

## 2017-06-09 ENCOUNTER — Ambulatory Visit (HOSPITAL_BASED_OUTPATIENT_CLINIC_OR_DEPARTMENT_OTHER): Payer: Medicare Other | Admitting: Internal Medicine

## 2017-06-09 ENCOUNTER — Ambulatory Visit (HOSPITAL_BASED_OUTPATIENT_CLINIC_OR_DEPARTMENT_OTHER): Payer: Medicare Other

## 2017-06-09 VITALS — BP 110/64 | HR 112 | Temp 99.9°F | Resp 19

## 2017-06-09 DIAGNOSIS — R5382 Chronic fatigue, unspecified: Secondary | ICD-10-CM

## 2017-06-09 DIAGNOSIS — Z5112 Encounter for antineoplastic immunotherapy: Secondary | ICD-10-CM

## 2017-06-09 DIAGNOSIS — Z79899 Other long term (current) drug therapy: Secondary | ICD-10-CM | POA: Diagnosis not present

## 2017-06-09 DIAGNOSIS — C3412 Malignant neoplasm of upper lobe, left bronchus or lung: Secondary | ICD-10-CM | POA: Diagnosis not present

## 2017-06-09 DIAGNOSIS — C3492 Malignant neoplasm of unspecified part of left bronchus or lung: Secondary | ICD-10-CM

## 2017-06-09 LAB — CBC WITH DIFFERENTIAL/PLATELET
BASO%: 0.7 % (ref 0.0–2.0)
Basophils Absolute: 0.1 10*3/uL (ref 0.0–0.1)
EOS%: 1.7 % (ref 0.0–7.0)
Eosinophils Absolute: 0.3 10*3/uL (ref 0.0–0.5)
HEMATOCRIT: 34.3 % — AB (ref 34.8–46.6)
HGB: 11 g/dL — ABNORMAL LOW (ref 11.6–15.9)
LYMPH#: 0.9 10*3/uL (ref 0.9–3.3)
LYMPH%: 5.7 % — ABNORMAL LOW (ref 14.0–49.7)
MCH: 26.1 pg (ref 25.1–34.0)
MCHC: 31.9 g/dL (ref 31.5–36.0)
MCV: 81.9 fL (ref 79.5–101.0)
MONO#: 0.8 10*3/uL (ref 0.1–0.9)
MONO%: 4.9 % (ref 0.0–14.0)
NEUT%: 87 % — AB (ref 38.4–76.8)
NEUTROS ABS: 13.4 10*3/uL — AB (ref 1.5–6.5)
Platelets: 583 10*3/uL — ABNORMAL HIGH (ref 145–400)
RBC: 4.19 10*6/uL (ref 3.70–5.45)
RDW: 21.3 % — ABNORMAL HIGH (ref 11.2–14.5)
WBC: 15.4 10*3/uL — AB (ref 3.9–10.3)

## 2017-06-09 LAB — TSH: TSH: 2.292 m[IU]/L (ref 0.308–3.960)

## 2017-06-09 LAB — COMPREHENSIVE METABOLIC PANEL
ALK PHOS: 151 U/L — AB (ref 40–150)
AST: 10 U/L (ref 5–34)
Albumin: 2.2 g/dL — ABNORMAL LOW (ref 3.5–5.0)
Anion Gap: 9 mEq/L (ref 3–11)
BILIRUBIN TOTAL: 0.24 mg/dL (ref 0.20–1.20)
BUN: 8 mg/dL (ref 7.0–26.0)
CALCIUM: 9.5 mg/dL (ref 8.4–10.4)
CHLORIDE: 101 meq/L (ref 98–109)
CO2: 28 mEq/L (ref 22–29)
Creatinine: 0.6 mg/dL (ref 0.6–1.1)
EGFR: >60 mL/min/{1.73_m2} (ref >60)
Glucose: 105 mg/dl (ref 70–140)
Potassium: 3.5 mEq/L (ref 3.5–5.1)
Sodium: 138 mEq/L (ref 136–145)
Total Protein: 6.8 g/dL (ref 6.4–8.3)

## 2017-06-09 MED ORDER — HEPARIN SOD (PORK) LOCK FLUSH 100 UNIT/ML IV SOLN
500.0000 [IU] | Freq: Once | INTRAVENOUS | Status: AC | PRN
Start: 2017-06-09 — End: 2017-06-09
  Administered 2017-06-09: 500 [IU]
  Filled 2017-06-09: qty 5

## 2017-06-09 MED ORDER — SODIUM CHLORIDE 0.9% FLUSH
10.0000 mL | INTRAVENOUS | Status: DC | PRN
  Administered 2017-06-09: 10 mL
  Filled 2017-06-09: qty 10

## 2017-06-09 MED ORDER — SODIUM CHLORIDE 0.9 % IV SOLN
480.0000 mg | Freq: Once | INTRAVENOUS | Status: AC
  Administered 2017-06-09: 480 mg via INTRAVENOUS
  Filled 2017-06-09: qty 48

## 2017-06-09 MED ORDER — SODIUM CHLORIDE 0.9 % IV SOLN
Freq: Once | INTRAVENOUS | Status: AC
  Administered 2017-06-09: 12:00:00 via INTRAVENOUS

## 2017-06-09 NOTE — Progress Notes (Signed)
Spoke with Dr. Julien Nordmann around 11:41 regarding pt having a temperature of 99.9 and an increase in WBC from 8.1 to 15.4. Informed Dr. Julien Nordmann and confirmed that pt had started an antibiotic (Levoquin PO). Received verbal confirmation to continue with [facility] infusion, but if pt wanted to reschedule for next week Dr. Julien Nordmann was agreeable. Pt stated she felt fine and wanted to continue with course for today. Will continue to monitor

## 2017-06-09 NOTE — Patient Instructions (Addendum)
[facility] Instructions for Patients Receiving Chemotherapy  Today you received the following chemotherapy agents:  Opdivo (nivolumab)  To help prevent nausea and vomiting after your treatment, we encourage you to take your nausea medication as prescribed.   If you develop nausea and vomiting that is not controlled by your nausea medication, call the clinic.   BELOW ARE SYMPTOMS THAT SHOULD BE REPORTED IMMEDIATELY:  *FEVER GREATER THAN 100.5 F  *CHILLS WITH OR WITHOUT FEVER  NAUSEA AND VOMITING THAT IS NOT CONTROLLED WITH YOUR NAUSEA MEDICATION  *UNUSUAL SHORTNESS OF BREATH  *UNUSUAL BRUISING OR BLEEDING  TENDERNESS IN MOUTH AND THROAT WITH OR WITHOUT PRESENCE OF ULCERS  *URINARY PROBLEMS  *BOWEL PROBLEMS  UNUSUAL RASH Items with * indicate a potential emergency and should be followed up as soon as possible.  Feel free to call the clinic should you have any questions or concerns. The clinic phone number is [phone].  Please show the [place] at check-in to the Emergency Department and triage nurse.     Nivolumab injection What is this medicine? NIVOLUMAB (nye VOL ue mab) is a monoclonal antibody. It is used to treat melanoma, lung cancer, kidney cancer, head and neck cancer, Hodgkin lymphoma, urothelial cancer, colon cancer, and liver cancer. This medicine may be used for other purposes; ask your health care provider or pharmacist if you have questions. COMMON BRAND NAME(S): Opdivo What should I tell my health care provider before I take this medicine? They need to know if you have any of these conditions: -diabetes -immune system problems -kidney disease -liver disease -lung disease -organ transplant -stomach or intestine problems -thyroid disease -an unusual or allergic reaction to nivolumab, other medicines, foods, dyes, or preservatives -pregnant or trying to get pregnant -breast-feeding How should I use this  medicine? This medicine is for infusion into a vein. It is given by a health care professional in a hospital or clinic setting. A special MedGuide will be given to you before each treatment. Be sure to read this information carefully each time. Talk to your pediatrician regarding the use of this medicine in children. While this drug may be prescribed for children as young as 12 years for selected conditions, precautions do apply. Overdosage: If you think you have taken too much of this medicine contact a poison control center or emergency room at once. NOTE: This medicine is only for you. Do not share this medicine with others. What if I miss a dose? It is important not to miss your dose. Call your doctor or health care professional if you are unable to keep an appointment. What may interact with this medicine? Interactions have not been studied. Give your health care provider a list of all the medicines, herbs, non-prescription drugs, or dietary supplements you use. Also tell them if you smoke, drink alcohol, or use illegal drugs. Some items may interact with your medicine. This list may not describe all possible interactions. Give your health care provider a list of all the medicines, herbs, non-prescription drugs, or dietary supplements you use. Also tell them if you smoke, drink alcohol, or use illegal drugs. Some items may interact with your medicine. What should I watch for while using this medicine? This drug may make you feel generally unwell. Continue your course of treatment even though you feel ill unless your doctor tells you to stop. You may need blood work done while you are taking this medicine. Do not become pregnant while taking this medicine or  for 5 months after stopping it. Women should inform their doctor if they wish to become pregnant or think they might be pregnant. There is a potential for serious side effects to an unborn child. Talk to your health care professional or  pharmacist for more information. Do not breast-feed an infant while taking this medicine. What side effects may I notice from receiving this medicine? Side effects that you should report to your doctor or health care professional as soon as possible: -allergic reactions like skin rash, itching or hives, swelling of the face, lips, or tongue -black, tarry stools -blood in the urine -bloody or watery diarrhea -changes in vision -change in sex drive -changes in emotions or moods -chest pain -confusion -cough -decreased appetite -diarrhea -facial flushing -feeling faint or lightheaded -fever, chills -hair loss -hallucination, loss of contact with reality -headache -irritable -joint pain -loss of memory -muscle pain -muscle weakness -seizures -shortness of breath -signs and symptoms of high blood sugar such as dizziness; dry mouth; dry skin; fruity breath; nausea; stomach pain; increased hunger or thirst; increased urination -signs and symptoms of kidney injury like trouble passing urine or change in the amount of urine -signs and symptoms of liver injury like dark yellow or brown urine; general ill feeling or flu-like symptoms; light-colored stools; loss of appetite; nausea; right upper belly pain; unusually weak or tired; yellowing of the eyes or skin -stiff neck -swelling of the ankles, feet, hands -weight gain Side effects that usually do not require medical attention (report to your doctor or health care professional if they continue or are bothersome): -bone pain -constipation -tiredness -vomiting This list may not describe all possible side effects. Call your doctor for medical advice about side effects. You may report side effects to FDA at 1-800-FDA-1088. Where should I keep my medicine? This drug is given in a hospital or clinic and will not be stored at home. NOTE: This sheet is a summary. It may not cover all possible information. If you have questions about this  medicine, talk to your doctor, pharmacist, or health care provider.  2018 [employer] (2016-04-21 17:49:34)

## 2017-06-11 ENCOUNTER — Telehealth: Payer: Self-pay | Admitting: *Deleted

## 2017-06-11 NOTE — Telephone Encounter (Signed)
Chemo follow up call: Pt denies any side effects at this time. Reportable symptoms reviewed, instructed pt to call office for diarrhea or worsening dyspnea. Pt has office contact #. Next appt confirmed.

## 2017-06-13 ENCOUNTER — Telehealth: Payer: Self-pay | Admitting: Internal Medicine

## 2017-06-13 NOTE — Telephone Encounter (Signed)
Left message for patient regarding appt per 11/13 sch msg. Sending confirmation letter in the mail.

## 2017-06-24 NOTE — Progress Notes (Signed)
This encounter was created in error - please disregard.

## 2017-06-27 DEATH — deceased

## 2017-07-07 ENCOUNTER — Ambulatory Visit: Payer: Medicare Other | Admitting: Internal Medicine

## 2017-07-07 ENCOUNTER — Ambulatory Visit: Payer: Medicare Other

## 2017-07-07 ENCOUNTER — Other Ambulatory Visit: Payer: Medicare Other

## 2017-07-15 ENCOUNTER — Telehealth: Payer: Self-pay | Admitting: Medical Oncology

## 2017-07-15 NOTE — Telephone Encounter (Signed)
Call to [facility] to obtain Death Certificate. Spoke with Fraser Din at [place] home. Per Fraser Din, they do not have a copy of Death Certificate. Patient died in [place] and Death Certificate processed there and was given to patient's family and funeral home was not provided with a copy. Unable to obtain Death Certificate.

## 2017-07-17 ENCOUNTER — Other Ambulatory Visit: Payer: Self-pay | Admitting: Nurse Practitioner

## 2017-08-04 ENCOUNTER — Other Ambulatory Visit: Payer: Medicare Other

## 2017-08-04 ENCOUNTER — Ambulatory Visit: Payer: Medicare Other

## 2017-09-09 ENCOUNTER — Ambulatory Visit: Payer: Medicare Other | Admitting: Adult Health
